# Patient Record
Sex: Female | Born: 1945 | Race: White | Hispanic: No | Smoking: Former smoker
Health system: Southern US, Community
[De-identification: ages and names within clinical notes are randomized; demographics above are authoritative.]

## PROBLEM LIST (undated history)

## (undated) DIAGNOSIS — I10 Essential (primary) hypertension: Secondary | ICD-10-CM

## (undated) DIAGNOSIS — Z9289 Personal history of other medical treatment: Secondary | ICD-10-CM

## (undated) DIAGNOSIS — I619 Nontraumatic intracerebral hemorrhage, unspecified: Secondary | ICD-10-CM

## (undated) DIAGNOSIS — I714 Abdominal aortic aneurysm, without rupture, unspecified: Secondary | ICD-10-CM

## (undated) HISTORY — PX: ABDOMINAL AORTIC ANEURYSM REPAIR: SUR1152

---

## 2014-05-20 ENCOUNTER — Encounter (HOSPITAL_COMMUNITY)
Admission: EM | Disposition: A | Discharge status: Rehabilitation Facility | Payer: Self-pay | Source: Home / Self Care | Attending: Pulmonary Disease

## 2014-05-20 ENCOUNTER — Emergency Department (HOSPITAL_COMMUNITY): Payer: BLUE CROSS/BLUE SHIELD

## 2014-05-20 ENCOUNTER — Other Ambulatory Visit (HOSPITAL_COMMUNITY): Payer: Self-pay

## 2014-05-20 ENCOUNTER — Inpatient Hospital Stay (HOSPITAL_COMMUNITY)
Admission: EM | Admit: 2014-05-20 | Discharge: 2014-06-03 | DRG: 023 | Disposition: A | Discharge status: Rehabilitation Facility | Payer: BLUE CROSS/BLUE SHIELD | Attending: Pulmonary Disease | Admitting: Pulmonary Disease

## 2014-05-20 ENCOUNTER — Inpatient Hospital Stay (HOSPITAL_COMMUNITY): Payer: BLUE CROSS/BLUE SHIELD | Admitting: Certified Registered"

## 2014-05-20 ENCOUNTER — Encounter (HOSPITAL_COMMUNITY): Payer: Self-pay | Admitting: *Deleted

## 2014-05-20 DIAGNOSIS — E6609 Other obesity due to excess calories: Secondary | ICD-10-CM | POA: Diagnosis present

## 2014-05-20 DIAGNOSIS — J9811 Atelectasis: Secondary | ICD-10-CM

## 2014-05-20 DIAGNOSIS — I6340 Cerebral infarction due to embolism of unspecified cerebral artery: Secondary | ICD-10-CM | POA: Diagnosis not present

## 2014-05-20 DIAGNOSIS — E870 Hyperosmolality and hypernatremia: Secondary | ICD-10-CM | POA: Diagnosis not present

## 2014-05-20 DIAGNOSIS — Z91040 Latex allergy status: Secondary | ICD-10-CM | POA: Diagnosis not present

## 2014-05-20 DIAGNOSIS — Z4659 Encounter for fitting and adjustment of other gastrointestinal appliance and device: Secondary | ICD-10-CM

## 2014-05-20 DIAGNOSIS — Z7982 Long term (current) use of aspirin: Secondary | ICD-10-CM

## 2014-05-20 DIAGNOSIS — R1310 Dysphagia, unspecified: Secondary | ICD-10-CM | POA: Diagnosis present

## 2014-05-20 DIAGNOSIS — I639 Cerebral infarction, unspecified: Secondary | ICD-10-CM | POA: Diagnosis not present

## 2014-05-20 DIAGNOSIS — I63132 Cerebral infarction due to embolism of left carotid artery: Secondary | ICD-10-CM | POA: Diagnosis not present

## 2014-05-20 DIAGNOSIS — R0902 Hypoxemia: Secondary | ICD-10-CM

## 2014-05-20 DIAGNOSIS — G8191 Hemiplegia, unspecified affecting right dominant side: Secondary | ICD-10-CM | POA: Diagnosis present

## 2014-05-20 DIAGNOSIS — E785 Hyperlipidemia, unspecified: Secondary | ICD-10-CM | POA: Diagnosis present

## 2014-05-20 DIAGNOSIS — R4701 Aphasia: Secondary | ICD-10-CM | POA: Diagnosis present

## 2014-05-20 DIAGNOSIS — I615 Nontraumatic intracerebral hemorrhage, intraventricular: Secondary | ICD-10-CM | POA: Diagnosis not present

## 2014-05-20 DIAGNOSIS — E669 Obesity, unspecified: Secondary | ICD-10-CM | POA: Diagnosis not present

## 2014-05-20 DIAGNOSIS — G936 Cerebral edema: Secondary | ICD-10-CM | POA: Diagnosis present

## 2014-05-20 DIAGNOSIS — I612 Nontraumatic intracerebral hemorrhage in hemisphere, unspecified: Secondary | ICD-10-CM

## 2014-05-20 DIAGNOSIS — G935 Compression of brain: Secondary | ICD-10-CM | POA: Diagnosis present

## 2014-05-20 DIAGNOSIS — Z87891 Personal history of nicotine dependence: Secondary | ICD-10-CM

## 2014-05-20 DIAGNOSIS — E876 Hypokalemia: Secondary | ICD-10-CM | POA: Diagnosis present

## 2014-05-20 DIAGNOSIS — Z9289 Personal history of other medical treatment: Secondary | ICD-10-CM

## 2014-05-20 DIAGNOSIS — Z7902 Long term (current) use of antithrombotics/antiplatelets: Secondary | ICD-10-CM

## 2014-05-20 DIAGNOSIS — I619 Nontraumatic intracerebral hemorrhage, unspecified: Secondary | ICD-10-CM

## 2014-05-20 DIAGNOSIS — I10 Essential (primary) hypertension: Secondary | ICD-10-CM | POA: Diagnosis present

## 2014-05-20 DIAGNOSIS — Z95828 Presence of other vascular implants and grafts: Secondary | ICD-10-CM | POA: Diagnosis not present

## 2014-05-20 DIAGNOSIS — I161 Hypertensive emergency: Secondary | ICD-10-CM

## 2014-05-20 DIAGNOSIS — Z91048 Other nonmedicinal substance allergy status: Secondary | ICD-10-CM

## 2014-05-20 DIAGNOSIS — E875 Hyperkalemia: Secondary | ICD-10-CM | POA: Diagnosis not present

## 2014-05-20 DIAGNOSIS — Z6838 Body mass index (BMI) 38.0-38.9, adult: Secondary | ICD-10-CM | POA: Diagnosis not present

## 2014-05-20 DIAGNOSIS — R001 Bradycardia, unspecified: Secondary | ICD-10-CM | POA: Diagnosis not present

## 2014-05-20 DIAGNOSIS — Z888 Allergy status to other drugs, medicaments and biological substances status: Secondary | ICD-10-CM

## 2014-05-20 DIAGNOSIS — J9690 Respiratory failure, unspecified, unspecified whether with hypoxia or hypercapnia: Secondary | ICD-10-CM

## 2014-05-20 DIAGNOSIS — Z978 Presence of other specified devices: Secondary | ICD-10-CM

## 2014-05-20 DIAGNOSIS — E039 Hypothyroidism, unspecified: Secondary | ICD-10-CM | POA: Diagnosis present

## 2014-05-20 DIAGNOSIS — I950 Idiopathic hypotension: Secondary | ICD-10-CM | POA: Diagnosis not present

## 2014-05-20 DIAGNOSIS — R5381 Other malaise: Secondary | ICD-10-CM | POA: Diagnosis not present

## 2014-05-20 DIAGNOSIS — D6489 Other specified anemias: Secondary | ICD-10-CM | POA: Diagnosis not present

## 2014-05-20 DIAGNOSIS — R29810 Facial weakness: Secondary | ICD-10-CM | POA: Diagnosis present

## 2014-05-20 DIAGNOSIS — I9589 Other hypotension: Secondary | ICD-10-CM | POA: Diagnosis present

## 2014-05-20 DIAGNOSIS — R4182 Altered mental status, unspecified: Secondary | ICD-10-CM | POA: Diagnosis present

## 2014-05-20 DIAGNOSIS — I611 Nontraumatic intracerebral hemorrhage in hemisphere, cortical: Secondary | ICD-10-CM | POA: Diagnosis present

## 2014-05-20 DIAGNOSIS — I959 Hypotension, unspecified: Secondary | ICD-10-CM | POA: Insufficient documentation

## 2014-05-20 DIAGNOSIS — R739 Hyperglycemia, unspecified: Secondary | ICD-10-CM | POA: Diagnosis present

## 2014-05-20 DIAGNOSIS — Z789 Other specified health status: Secondary | ICD-10-CM | POA: Diagnosis not present

## 2014-05-20 DIAGNOSIS — I610 Nontraumatic intracerebral hemorrhage in hemisphere, subcortical: Secondary | ICD-10-CM

## 2014-05-20 DIAGNOSIS — I609 Nontraumatic subarachnoid hemorrhage, unspecified: Secondary | ICD-10-CM | POA: Diagnosis not present

## 2014-05-20 DIAGNOSIS — J9600 Acute respiratory failure, unspecified whether with hypoxia or hypercapnia: Secondary | ICD-10-CM

## 2014-05-20 DIAGNOSIS — G8190 Hemiplegia, unspecified affecting unspecified side: Secondary | ICD-10-CM | POA: Diagnosis not present

## 2014-05-20 DIAGNOSIS — I6789 Other cerebrovascular disease: Secondary | ICD-10-CM | POA: Diagnosis not present

## 2014-05-20 HISTORY — PX: CRANIOTOMY: SHX93

## 2014-05-20 HISTORY — DX: Abdominal aortic aneurysm, without rupture, unspecified: I71.40

## 2014-05-20 HISTORY — DX: Essential (primary) hypertension: I10

## 2014-05-20 HISTORY — DX: Abdominal aortic aneurysm, without rupture: I71.4

## 2014-05-20 HISTORY — DX: Nontraumatic intracerebral hemorrhage, unspecified: I61.9

## 2014-05-20 HISTORY — DX: Personal history of other medical treatment: Z92.89

## 2014-05-20 LAB — COMPREHENSIVE METABOLIC PANEL
ALT: 29 U/L (ref 0–35)
AST: 28 U/L (ref 0–37)
Albumin: 3.9 g/dL (ref 3.5–5.2)
Alkaline Phosphatase: 125 U/L — ABNORMAL HIGH (ref 39–117)
Anion gap: 9 (ref 5–15)
BILIRUBIN TOTAL: 0.6 mg/dL (ref 0.3–1.2)
BUN: 12 mg/dL (ref 6–23)
CO2: 25 mmol/L (ref 19–32)
CREATININE: 0.62 mg/dL (ref 0.50–1.10)
Calcium: 9.3 mg/dL (ref 8.4–10.5)
Chloride: 105 mmol/L (ref 96–112)
GFR calc Af Amer: >90 mL/min (ref >90)
GFR calc non Af Amer: >90 mL/min (ref >90)
GLUCOSE: 118 mg/dL — AB (ref 70–99)
Potassium: 3.4 mmol/L — ABNORMAL LOW (ref 3.5–5.1)
Sodium: 139 mmol/L (ref 135–145)
Total Protein: 6.7 g/dL (ref 6.0–8.3)

## 2014-05-20 LAB — DIFFERENTIAL
BASOS PCT: 0 % (ref 0–1)
Basophils Absolute: 0 10*3/uL (ref 0.0–0.1)
EOS ABS: 0.2 10*3/uL (ref 0.0–0.7)
EOS PCT: 3 % (ref 0–5)
Lymphocytes Relative: 22 % (ref 12–46)
Lymphs Abs: 2.1 10*3/uL (ref 0.7–4.0)
MONO ABS: 1 10*3/uL (ref 0.1–1.0)
Monocytes Relative: 10 % (ref 3–12)
Neutro Abs: 6.3 10*3/uL (ref 1.7–7.7)
Neutrophils Relative %: 65 % (ref 43–77)

## 2014-05-20 LAB — BLOOD GAS, ARTERIAL
ACID-BASE DEFICIT: 1 mmol/L (ref 0.0–2.0)
Bicarbonate: 23.7 mEq/L (ref 20.0–24.0)
Drawn by: 347621
FIO2: 0.4 %
O2 Saturation: 94.2 %
PATIENT TEMPERATURE: 97
PEEP: 5 cmH2O
RATE: 16 resp/min
TCO2: 25 mmol/L (ref 0–100)
VT: 500 mL
pCO2 arterial: 41.7 mmHg (ref 35.0–45.0)
pH, Arterial: 7.368 (ref 7.350–7.450)
pO2, Arterial: 74.9 mmHg — ABNORMAL LOW (ref 80.0–100.0)

## 2014-05-20 LAB — BASIC METABOLIC PANEL
Anion gap: 13 (ref 5–15)
BUN: 10 mg/dL (ref 6–23)
CHLORIDE: 105 mmol/L (ref 96–112)
CO2: 23 mmol/L (ref 19–32)
Calcium: 8.4 mg/dL (ref 8.4–10.5)
Creatinine, Ser: 0.73 mg/dL (ref 0.50–1.10)
GFR calc non Af Amer: 86 mL/min — ABNORMAL LOW (ref >90)
Glucose, Bld: 150 mg/dL — ABNORMAL HIGH (ref 70–99)
POTASSIUM: 3.9 mmol/L (ref 3.5–5.1)
SODIUM: 141 mmol/L (ref 135–145)

## 2014-05-20 LAB — TYPE AND SCREEN
ABO/RH(D): A POS
ANTIBODY SCREEN: NEGATIVE

## 2014-05-20 LAB — URINALYSIS, ROUTINE W REFLEX MICROSCOPIC
Bilirubin Urine: NEGATIVE
GLUCOSE, UA: NEGATIVE mg/dL
Hgb urine dipstick: NEGATIVE
Ketones, ur: NEGATIVE mg/dL
LEUKOCYTES UA: NEGATIVE
NITRITE: NEGATIVE
Protein, ur: 100 mg/dL — AB
Specific Gravity, Urine: 1.017 (ref 1.005–1.030)
Urobilinogen, UA: 0.2 mg/dL (ref 0.0–1.0)
pH: 7.5 (ref 5.0–8.0)

## 2014-05-20 LAB — URINE MICROSCOPIC-ADD ON

## 2014-05-20 LAB — APTT: aPTT: 30 seconds (ref 24–37)

## 2014-05-20 LAB — RAPID URINE DRUG SCREEN, HOSP PERFORMED
Amphetamines: NOT DETECTED
BARBITURATES: NOT DETECTED
Benzodiazepines: NOT DETECTED
Cocaine: NOT DETECTED
Opiates: NOT DETECTED
TETRAHYDROCANNABINOL: NOT DETECTED

## 2014-05-20 LAB — PROTIME-INR
INR: 0.91 (ref 0.00–1.49)
Prothrombin Time: 12.4 seconds (ref 11.6–15.2)

## 2014-05-20 LAB — ETHANOL: Alcohol, Ethyl (B): <5 mg/dL (ref 0–9)

## 2014-05-20 LAB — ABO/RH: ABO/RH(D): A POS

## 2014-05-20 LAB — CBC
HCT: 38.4 % (ref 36.0–46.0)
HEMATOCRIT: 40 % (ref 36.0–46.0)
HEMOGLOBIN: 12.7 g/dL (ref 12.0–15.0)
HEMOGLOBIN: 12 g/dL (ref 12.0–15.0)
MCH: 27.1 pg (ref 26.0–34.0)
MCH: 27.1 pg (ref 26.0–34.0)
MCHC: 31.8 g/dL (ref 30.0–36.0)
MCHC: 31.3 g/dL (ref 30.0–36.0)
MCV: 85.5 fL (ref 78.0–100.0)
MCV: 86.7 fL (ref 78.0–100.0)
Platelets: 234 10*3/uL (ref 150–400)
Platelets: 247 10*3/uL (ref 150–400)
RBC: 4.68 MIL/uL (ref 3.87–5.11)
RBC: 4.43 MIL/uL (ref 3.87–5.11)
RDW: 15.3 % (ref 11.5–15.5)
RDW: 15.5 % (ref 11.5–15.5)
WBC: 9.7 10*3/uL (ref 4.0–10.5)
WBC: 17 10*3/uL — AB (ref 4.0–10.5)

## 2014-05-20 LAB — I-STAT CHEM 8, ED
BUN: 16 mg/dL (ref 6–23)
Calcium, Ion: 1.17 mmol/L (ref 1.13–1.30)
Chloride: 106 mmol/L (ref 96–112)
Creatinine, Ser: 0.6 mg/dL (ref 0.50–1.10)
Glucose, Bld: 125 mg/dL — ABNORMAL HIGH (ref 70–99)
HCT: 42 % (ref 36.0–46.0)
HEMOGLOBIN: 14.3 g/dL (ref 12.0–15.0)
POTASSIUM: 3.4 mmol/L — AB (ref 3.5–5.1)
Sodium: 140 mmol/L (ref 135–145)
TCO2: 24 mmol/L (ref 0–100)

## 2014-05-20 LAB — MRSA PCR SCREENING: MRSA BY PCR: POSITIVE — AB

## 2014-05-20 LAB — I-STAT TROPONIN, ED: Troponin i, poc: 0 ng/mL (ref 0.00–0.08)

## 2014-05-20 LAB — GLUCOSE, CAPILLARY
Glucose-Capillary: 129 mg/dL — ABNORMAL HIGH (ref 70–99)
Glucose-Capillary: 166 mg/dL — ABNORMAL HIGH (ref 70–99)

## 2014-05-20 SURGERY — CRANIOTOMY HEMATOMA EVACUATION SUBDURAL
Anesthesia: General | Site: Head | Laterality: Left

## 2014-05-20 MED ORDER — ACETAMINOPHEN 325 MG PO TABS: 650.0000 mg | ORAL_TABLET | ORAL | Status: DC | PRN

## 2014-05-20 MED ORDER — FENTANYL CITRATE (PF) 250 MCG/5ML IJ SOLN
INTRAMUSCULAR | Status: AC
  Filled 2014-05-20: qty 5

## 2014-05-20 MED ORDER — THROMBIN 20000 UNITS EX SOLR
CUTANEOUS | Status: DC | PRN
  Administered 2014-05-20: 20 mL via TOPICAL

## 2014-05-20 MED ORDER — SODIUM CHLORIDE 0.9 % IJ SOLN
INTRAMUSCULAR | Status: AC
  Filled 2014-05-20: qty 10

## 2014-05-20 MED ORDER — POTASSIUM CHLORIDE 20 MEQ/15ML (10%) PO SOLN
40.0000 meq | Freq: Once | ORAL | Status: DC
  Filled 2014-05-20: qty 30

## 2014-05-20 MED ORDER — STROKE: EARLY STAGES OF RECOVERY BOOK
Freq: Once | Status: AC
  Administered 2014-05-20: 17:00:00
  Filled 2014-05-20: qty 1

## 2014-05-20 MED ORDER — ACETAMINOPHEN 650 MG RE SUPP: 650.0000 mg | RECTAL | Status: DC | PRN

## 2014-05-20 MED ORDER — PHENYLEPHRINE HCL 10 MG/ML IJ SOLN
10.0000 mg | INTRAVENOUS | Status: DC | PRN
  Administered 2014-05-20: 30 ug/min via INTRAVENOUS

## 2014-05-20 MED ORDER — SUCCINYLCHOLINE CHLORIDE 20 MG/ML IJ SOLN
INTRAMUSCULAR | Status: AC
  Administered 2014-05-20: 20 mg
  Filled 2014-05-20: qty 1

## 2014-05-20 MED ORDER — SENNOSIDES-DOCUSATE SODIUM 8.6-50 MG PO TABS
1.0000 | ORAL_TABLET | Freq: Two times a day (BID) | ORAL | Status: DC
  Administered 2014-05-21 – 2014-05-23 (×6): 1
  Filled 2014-05-20 (×13): qty 1

## 2014-05-20 MED ORDER — THROMBIN 5000 UNITS EX SOLR
OROMUCOSAL | Status: DC | PRN
  Administered 2014-05-20 (×2): 10 mL via TOPICAL

## 2014-05-20 MED ORDER — LIDOCAINE HCL (CARDIAC) 20 MG/ML IV SOLN
INTRAVENOUS | Status: AC
  Filled 2014-05-20: qty 5

## 2014-05-20 MED ORDER — FENTANYL CITRATE (PF) 100 MCG/2ML IJ SOLN
25.0000 ug | INTRAMUSCULAR | Status: DC | PRN
  Administered 2014-05-20: 100 ug via INTRAVENOUS
  Administered 2014-05-21: 25 ug via INTRAVENOUS
  Filled 2014-05-20 (×2): qty 2

## 2014-05-20 MED ORDER — NICARDIPINE HCL IN NACL 20-0.86 MG/200ML-% IV SOLN
3.0000 mg/h | Freq: Once | INTRAVENOUS | Status: AC
  Administered 2014-05-20: 0.5 mg/h via INTRAVENOUS

## 2014-05-20 MED ORDER — LABETALOL HCL 5 MG/ML IV SOLN: 10.0000 mg | INTRAVENOUS | Status: DC | PRN

## 2014-05-20 MED ORDER — ONDANSETRON HCL 4 MG/2ML IJ SOLN
INTRAMUSCULAR | Status: AC
  Filled 2014-05-20: qty 2

## 2014-05-20 MED ORDER — CHLORHEXIDINE GLUCONATE 0.12 % MT SOLN
15.0000 mL | Freq: Two times a day (BID) | OROMUCOSAL | Status: DC
  Administered 2014-05-21 – 2014-06-01 (×23): 15 mL via OROMUCOSAL
  Filled 2014-05-20 (×24): qty 15

## 2014-05-20 MED ORDER — ROCURONIUM BROMIDE 50 MG/5ML IV SOLN
INTRAVENOUS | Status: AC
  Filled 2014-05-20: qty 1

## 2014-05-20 MED ORDER — CETYLPYRIDINIUM CHLORIDE 0.05 % MT LIQD
7.0000 mL | Freq: Four times a day (QID) | OROMUCOSAL | Status: DC
  Administered 2014-05-21 – 2014-06-01 (×48): 7 mL via OROMUCOSAL

## 2014-05-20 MED ORDER — MICROFIBRILLAR COLL HEMOSTAT EX PADS
MEDICATED_PAD | CUTANEOUS | Status: DC | PRN
  Administered 2014-05-20: 1 via TOPICAL

## 2014-05-20 MED ORDER — CEFAZOLIN SODIUM-DEXTROSE 2-3 GM-% IV SOLR
INTRAVENOUS | Status: AC
  Filled 2014-05-20: qty 50

## 2014-05-20 MED ORDER — ROCURONIUM BROMIDE 100 MG/10ML IV SOLN
INTRAVENOUS | Status: DC | PRN
  Administered 2014-05-20: 50 mg via INTRAVENOUS
  Administered 2014-05-20: 40 mg via INTRAVENOUS
  Administered 2014-05-20: 10 mg via INTRAVENOUS

## 2014-05-20 MED ORDER — NICARDIPINE HCL IN NACL 20-0.86 MG/200ML-% IV SOLN
INTRAVENOUS | Status: AC
  Filled 2014-05-20: qty 200

## 2014-05-20 MED ORDER — ARTIFICIAL TEARS OP OINT
TOPICAL_OINTMENT | OPHTHALMIC | Status: AC
  Filled 2014-05-20: qty 3.5

## 2014-05-20 MED ORDER — FENTANYL CITRATE (PF) 100 MCG/2ML IJ SOLN
INTRAMUSCULAR | Status: DC | PRN
  Administered 2014-05-20 (×4): 100 ug via INTRAVENOUS
  Administered 2014-05-20: 50 ug via INTRAVENOUS

## 2014-05-20 MED ORDER — ONDANSETRON HCL 4 MG PO TABS: 4.0000 mg | ORAL_TABLET | ORAL | Status: DC | PRN

## 2014-05-20 MED ORDER — PROMETHAZINE HCL 25 MG PO TABS: 12.5000 mg | ORAL_TABLET | ORAL | Status: DC | PRN

## 2014-05-20 MED ORDER — ONDANSETRON HCL 4 MG/2ML IJ SOLN
INTRAMUSCULAR | Status: AC
  Administered 2014-05-20: 4 mg
  Filled 2014-05-20: qty 2

## 2014-05-20 MED ORDER — ONDANSETRON HCL 4 MG/2ML IJ SOLN
4.0000 mg | INTRAMUSCULAR | Status: DC | PRN
  Administered 2014-05-20: 4 mg via INTRAVENOUS

## 2014-05-20 MED ORDER — ONDANSETRON HCL 4 MG/2ML IJ SOLN
4.0000 mg | Freq: Four times a day (QID) | INTRAMUSCULAR | Status: DC | PRN
Start: 2014-05-20 — End: 2014-06-03
  Administered 2014-05-20: 4 mg via INTRAVENOUS
  Filled 2014-05-20: qty 2

## 2014-05-20 MED ORDER — ROCURONIUM BROMIDE 50 MG/5ML IV SOLN
INTRAVENOUS | Status: AC
  Filled 2014-05-20: qty 2

## 2014-05-20 MED ORDER — CEFAZOLIN SODIUM-DEXTROSE 2-3 GM-% IV SOLR
2.0000 g | Freq: Three times a day (TID) | INTRAVENOUS | Status: AC
  Administered 2014-05-20 – 2014-05-21 (×2): 2 g via INTRAVENOUS
  Filled 2014-05-20 (×2): qty 50

## 2014-05-20 MED ORDER — PROPOFOL 1000 MG/100ML IV EMUL
5.0000 ug/kg/min | Freq: Once | INTRAVENOUS | Status: DC
  Administered 2014-05-20: 30 ug/kg/min via INTRAVENOUS

## 2014-05-20 MED ORDER — SODIUM CHLORIDE 0.9 % IV SOLN
500.0000 mg | Freq: Two times a day (BID) | INTRAVENOUS | Status: DC
  Administered 2014-05-20 – 2014-05-28 (×17): 500 mg via INTRAVENOUS
  Filled 2014-05-20 (×18): qty 5

## 2014-05-20 MED ORDER — PROPOFOL 1000 MG/100ML IV EMUL
INTRAVENOUS | Status: AC
  Filled 2014-05-20: qty 100

## 2014-05-20 MED ORDER — ASPIRIN 300 MG RE SUPP: 300.0000 mg | RECTAL | Status: DC

## 2014-05-20 MED ORDER — FENTANYL CITRATE (PF) 100 MCG/2ML IJ SOLN: 12.5000 ug | INTRAMUSCULAR | Status: DC | PRN

## 2014-05-20 MED ORDER — NICARDIPINE HCL IN NACL 20-0.86 MG/200ML-% IV SOLN: 3.0000 mg/h | Freq: Once | INTRAVENOUS | Status: DC

## 2014-05-20 MED ORDER — CEFAZOLIN SODIUM-DEXTROSE 2-3 GM-% IV SOLR
INTRAVENOUS | Status: DC | PRN
  Administered 2014-05-20: 2 g via INTRAVENOUS

## 2014-05-20 MED ORDER — PANTOPRAZOLE SODIUM 40 MG IV SOLR
40.0000 mg | Freq: Every day | INTRAVENOUS | Status: DC
  Administered 2014-05-20: 40 mg via INTRAVENOUS
  Filled 2014-05-20 (×2): qty 40

## 2014-05-20 MED ORDER — LIDOCAINE HCL (PF) 1 % IJ SOLN
INTRAMUSCULAR | Status: DC | PRN
  Administered 2014-05-20: 6 mL

## 2014-05-20 MED ORDER — SODIUM CHLORIDE 0.9 % IV SOLN
Freq: Once | INTRAVENOUS | Status: AC
  Administered 2014-05-20: 22:00:00 via INTRAVENOUS

## 2014-05-20 MED ORDER — ETOMIDATE 2 MG/ML IV SOLN
INTRAVENOUS | Status: AC
  Administered 2014-05-20: 20 mg
  Filled 2014-05-20: qty 20

## 2014-05-20 MED ORDER — EPHEDRINE SULFATE 50 MG/ML IJ SOLN
INTRAMUSCULAR | Status: AC
  Filled 2014-05-20: qty 1

## 2014-05-20 MED ORDER — SODIUM CHLORIDE 0.9 % IV SOLN
INTRAVENOUS | Status: DC
  Administered 2014-05-20 (×2): via INTRAVENOUS

## 2014-05-20 MED ORDER — BACITRACIN 50000 UNITS IM SOLR
INTRAMUSCULAR | Status: DC | PRN
  Administered 2014-05-20: 500 mL

## 2014-05-20 MED ORDER — ASPIRIN 81 MG PO CHEW: 324.0000 mg | CHEWABLE_TABLET | ORAL | Status: DC

## 2014-05-20 MED ORDER — NICARDIPINE HCL IN NACL 20-0.86 MG/200ML-% IV SOLN
3.0000 mg/h | INTRAVENOUS | Status: DC
  Administered 2014-05-20: 7 mg/h via INTRAVENOUS
  Filled 2014-05-20 (×2): qty 200

## 2014-05-20 MED ORDER — PHENYLEPHRINE HCL 10 MG/ML IJ SOLN
INTRAMUSCULAR | Status: DC | PRN
  Administered 2014-05-20 (×2): 80 ug via INTRAVENOUS

## 2014-05-20 MED ORDER — PANTOPRAZOLE SODIUM 40 MG IV SOLR: 40.0000 mg | Freq: Every day | INTRAVENOUS | Status: DC

## 2014-05-20 MED ORDER — SENNOSIDES-DOCUSATE SODIUM 8.6-50 MG PO TABS
1.0000 | ORAL_TABLET | Freq: Two times a day (BID) | ORAL | Status: DC
  Filled 2014-05-20: qty 1

## 2014-05-20 MED ORDER — ACETAMINOPHEN 650 MG RE SUPP
650.0000 mg | RECTAL | Status: DC | PRN
Start: 2014-05-20 — End: 2014-05-29

## 2014-05-20 MED ORDER — LEVOTHYROXINE SODIUM 100 MCG IV SOLR
37.5000 ug | Freq: Every day | INTRAVENOUS | Status: DC
  Administered 2014-05-21: 37.5 ug via INTRAVENOUS
  Filled 2014-05-20 (×2): qty 5

## 2014-05-20 MED ORDER — 0.9 % SODIUM CHLORIDE (POUR BTL) OPTIME
TOPICAL | Status: DC | PRN
  Administered 2014-05-20 (×3): 1000 mL

## 2014-05-20 MED ORDER — PROPOFOL 10 MG/ML IV BOLUS
INTRAVENOUS | Status: AC
  Filled 2014-05-20: qty 20

## 2014-05-20 MED ORDER — MORPHINE SULFATE 2 MG/ML IJ SOLN: 1.0000 mg | INTRAMUSCULAR | Status: DC | PRN

## 2014-05-20 MED ORDER — SODIUM CHLORIDE 0.9 % IV SOLN
250.0000 mL | INTRAVENOUS | Status: DC | PRN
  Administered 2014-05-20: 18:00:00 via INTRAVENOUS

## 2014-05-20 MED ORDER — PROPOFOL 1000 MG/100ML IV EMUL
5.0000 ug/kg/min | INTRAVENOUS | Status: DC
  Administered 2014-05-20: 30 ug/kg/min via INTRAVENOUS
  Administered 2014-05-21: 50 ug/kg/min via INTRAVENOUS
  Administered 2014-05-21: 40 ug/kg/min via INTRAVENOUS
  Administered 2014-05-21: 35 ug/kg/min via INTRAVENOUS
  Administered 2014-05-21: 40 ug/kg/min via INTRAVENOUS
  Administered 2014-05-21: 45 ug/kg/min via INTRAVENOUS
  Administered 2014-05-22: 50 ug/kg/min via INTRAVENOUS
  Administered 2014-05-22: 20 ug/kg/min via INTRAVENOUS
  Administered 2014-05-22: 40 ug/kg/min via INTRAVENOUS
  Filled 2014-05-20 (×9): qty 100

## 2014-05-20 SURGICAL SUPPLY — 64 items
BAG DECANTER FOR FLEXI CONT (MISCELLANEOUS) ×3 IMPLANT
BANDAGE GAUZE 4  KLING STR (GAUZE/BANDAGES/DRESSINGS) ×3 IMPLANT
BENZOIN TINCTURE PRP APPL 2/3 (GAUZE/BANDAGES/DRESSINGS) ×3 IMPLANT
BIT DRILL WIRE PASS 1.3MM (BIT) ×1 IMPLANT
BLADE CLIPPER SURG (BLADE) ×3 IMPLANT
BNDG GAUZE ELAST 4 BULKY (GAUZE/BANDAGES/DRESSINGS) ×3 IMPLANT
BRUSH SCRUB EZ 1% IODOPHOR (MISCELLANEOUS) IMPLANT
BRUSH SCRUB EZ PLAIN DRY (MISCELLANEOUS) ×3 IMPLANT
BUR ROUTER D-58 CRANI (BURR) IMPLANT
CANISTER SUCT 3000ML PPV (MISCELLANEOUS) ×3 IMPLANT
CLIP TI MEDIUM 6 (CLIP) IMPLANT
CONT SPEC 4OZ CLIKSEAL STRL BL (MISCELLANEOUS) ×3 IMPLANT
COVER BACK TABLE 60X90IN (DRAPES) ×6 IMPLANT
DRAPE NEUROLOGICAL W/INCISE (DRAPES) ×3 IMPLANT
DRAPE SURG 17X23 STRL (DRAPES) IMPLANT
DRAPE WARM FLUID 44X44 (DRAPE) ×3 IMPLANT
DRILL WIRE PASS 1.3MM (BIT) ×3
DRSG TELFA 3X8 NADH (GAUZE/BANDAGES/DRESSINGS) ×3 IMPLANT
ELECT CAUTERY BLADE 6.4 (BLADE) ×3 IMPLANT
ELECT REM PT RETURN 9FT ADLT (ELECTROSURGICAL) ×3
ELECTRODE REM PT RTRN 9FT ADLT (ELECTROSURGICAL) ×1 IMPLANT
GAUZE SPONGE 4X4 12PLY STRL (GAUZE/BANDAGES/DRESSINGS) ×3 IMPLANT
GAUZE SPONGE 4X4 16PLY XRAY LF (GAUZE/BANDAGES/DRESSINGS) IMPLANT
GLOVE ECLIPSE 8.0 STRL XLNG CF (GLOVE) ×3 IMPLANT
GLOVE EXAM NITRILE LRG STRL (GLOVE) IMPLANT
GLOVE EXAM NITRILE XL STR (GLOVE) IMPLANT
GLOVE EXAM NITRILE XS STR PU (GLOVE) IMPLANT
GOWN STRL REUS W/ TWL LRG LVL3 (GOWN DISPOSABLE) IMPLANT
GOWN STRL REUS W/ TWL XL LVL3 (GOWN DISPOSABLE) IMPLANT
GOWN STRL REUS W/TWL 2XL LVL3 (GOWN DISPOSABLE) ×3 IMPLANT
GOWN STRL REUS W/TWL LRG LVL3 (GOWN DISPOSABLE)
GOWN STRL REUS W/TWL XL LVL3 (GOWN DISPOSABLE)
HEMOSTAT SURGICEL 2X14 (HEMOSTASIS) ×3 IMPLANT
KIT BASIN OR (CUSTOM PROCEDURE TRAY) ×3 IMPLANT
KIT ROOM TURNOVER OR (KITS) ×3 IMPLANT
NEEDLE HYPO 22GX1.5 SAFETY (NEEDLE) ×3 IMPLANT
NS IRRIG 1000ML POUR BTL (IV SOLUTION) ×3 IMPLANT
PACK CRANIOTOMY (CUSTOM PROCEDURE TRAY) ×3 IMPLANT
PAD ARMBOARD 7.5X6 YLW CONV (MISCELLANEOUS) ×3 IMPLANT
PAD EYE OVAL STERILE LF (GAUZE/BANDAGES/DRESSINGS) IMPLANT
PATTIES SURGICAL .25X.25 (GAUZE/BANDAGES/DRESSINGS) IMPLANT
PATTIES SURGICAL .5 X.5 (GAUZE/BANDAGES/DRESSINGS) IMPLANT
PATTIES SURGICAL .5 X3 (DISPOSABLE) IMPLANT
PATTIES SURGICAL .75X.75 (GAUZE/BANDAGES/DRESSINGS) ×3 IMPLANT
PATTIES SURGICAL 1X1 (DISPOSABLE) IMPLANT
PERFORATOR LRG  14-11MM (BIT) ×2
PERFORATOR LRG 14-11MM (BIT) ×1 IMPLANT
PIN MAYFIELD SKULL DISP (PIN) IMPLANT
PLATE 1.5  2HOLE LNG NEURO (Plate) ×6 IMPLANT
PLATE 1.5 2HOLE LNG NEURO (Plate) ×3 IMPLANT
SCREW SELF DRILL HT 1.5/4MM (Screw) ×18 IMPLANT
SPECIMEN JAR SMALL (MISCELLANEOUS) IMPLANT
SPONGE NEURO XRAY DETECT 1X3 (DISPOSABLE) IMPLANT
SPONGE SURGIFOAM ABS GEL 100 (HEMOSTASIS) ×3 IMPLANT
STAPLER SKIN PROX WIDE 3.9 (STAPLE) ×3 IMPLANT
SUT NURALON 4 0 TR CR/8 (SUTURE) ×6 IMPLANT
SUT VIC AB 2-0 OS6 18 (SUTURE) ×9 IMPLANT
SYR CONTROL 10ML LL (SYRINGE) ×3 IMPLANT
TAPE CLOTH 1X10 TAN NS (GAUZE/BANDAGES/DRESSINGS) ×3 IMPLANT
TOWEL OR 17X24 6PK STRL BLUE (TOWEL DISPOSABLE) ×3 IMPLANT
TOWEL OR 17X26 10 PK STRL BLUE (TOWEL DISPOSABLE) ×3 IMPLANT
TRAY FOLEY CATH 14FRSI W/METER (CATHETERS) IMPLANT
UNDERPAD 30X30 INCONTINENT (UNDERPADS AND DIAPERS) IMPLANT
WATER STERILE IRR 1000ML POUR (IV SOLUTION) ×3 IMPLANT

## 2014-05-20 NOTE — Consult Note (Signed)
Reason for Consult:Intracerebral hemorrhage Referring Physician: CCM  Carolyn [name] is an 69 y.o. female.  HPI: 69 yo female with history of hypertension, and recent stenting of AAA, and now takes plavix. She was in her usual state of health until today when she became suddenly comatose and was brought to [facility] where CT head showed left ICH with 3 mm of midline shift. Neurosurgical consult requested.   Past Medical History  Diagnosis Date  . Hypertension     Past Surgical History  Procedure Laterality Date  . Abdominal aortic aneurysm repair      No family history on file.  Social History:  has no tobacco, alcohol, and drug history on file.  Allergies: Allergies not on file  Medications: I have reviewed the patient's current medications.  Results for orders placed or performed during the hospital encounter of 05/20/14 (from the past 48 hour(s))  Ethanol     Status: None   Collection Time: 05/20/14  1:43 PM  Result Value Ref Range   Alcohol, Ethyl (B) <5 0 - 9 mg/dL    Comment:        LOWEST DETECTABLE LIMIT FOR SERUM ALCOHOL IS 11 mg/dL FOR MEDICAL PURPOSES ONLY   Protime-INR     Status: None   Collection Time: 05/20/14  1:43 PM  Result Value Ref Range   Prothrombin Time 12.4 11.6 - 15.2 seconds   INR 0.91 0.00 - 1.49  APTT     Status: None   Collection Time: 05/20/14  1:43 PM  Result Value Ref Range   aPTT 30 24 - 37 seconds  CBC     Status: None   Collection Time: 05/20/14  1:43 PM  Result Value Ref Range   WBC 9.7 4.0 - 10.5 K/uL   RBC 4.68 3.87 - 5.11 MIL/uL   Hemoglobin 12.7 12.0 - 15.0 g/dL   HCT 40.0 36.0 - 46.0 %   MCV 85.5 78.0 - 100.0 fL   MCH 27.1 26.0 - 34.0 pg   MCHC 31.8 30.0 - 36.0 g/dL   RDW 15.3 11.5 - 15.5 %   Platelets 234 150 - 400 K/uL  Differential     Status: None   Collection Time: 05/20/14  1:43 PM  Result Value Ref Range   Neutrophils Relative % 65 43 - 77 %   Neutro Abs 6.3 1.7 - 7.7 K/uL   Lymphocytes Relative 22 12 - 46 %    Lymphs Abs 2.1 0.7 - 4.0 K/uL   Monocytes Relative 10 3 - 12 %   Monocytes Absolute 1.0 0.1 - 1.0 K/uL   Eosinophils Relative 3 0 - 5 %   Eosinophils Absolute 0.2 0.0 - 0.7 K/uL   Basophils Relative 0 0 - 1 %   Basophils Absolute 0.0 0.0 - 0.1 K/uL  Comprehensive metabolic panel     Status: Abnormal   Collection Time: 05/20/14  1:43 PM  Result Value Ref Range   Sodium 139 135 - 145 mmol/L   Potassium 3.4 (L) 3.5 - 5.1 mmol/L   Chloride 105 96 - 112 mmol/L   CO2 25 19 - 32 mmol/L   Glucose, Bld 118 (H) 70 - 99 mg/dL   BUN 12 6 - 23 mg/dL   Creatinine, Ser 0.62 0.50 - 1.10 mg/dL   Calcium 9.3 8.4 - 10.5 mg/dL   Total Protein 6.7 6.0 - 8.3 g/dL   Albumin 3.9 3.5 - 5.2 g/dL   AST 28 0 - 37 U/L   ALT  29 0 - 35 U/L   Alkaline Phosphatase 125 (H) 39 - 117 U/L   Total Bilirubin 0.6 0.3 - 1.2 mg/dL   GFR calc non Af Amer >90 >90 mL/min   GFR calc Af Amer >90 >90 mL/min    Comment: (NOTE) The eGFR has been calculated using the CKD EPI equation. This calculation has not been validated in all clinical situations. eGFR's persistently <90 mL/min signify possible Chronic Kidney Disease.    Anion gap 9 5 - 15  I-Stat Chem 8, ED     Status: Abnormal   Collection Time: 05/20/14  1:56 PM  Result Value Ref Range   Sodium 140 135 - 145 mmol/L   Potassium 3.4 (L) 3.5 - 5.1 mmol/L   Chloride 106 96 - 112 mmol/L   BUN 16 6 - 23 mg/dL   Creatinine, Ser 0.60 0.50 - 1.10 mg/dL   Glucose, Bld 125 (H) 70 - 99 mg/dL   Calcium, Ion 1.17 1.13 - 1.30 mmol/L   TCO2 24 0 - 100 mmol/L   Hemoglobin 14.3 12.0 - 15.0 g/dL   HCT 42.0 36.0 - 46.0 %  I-Stat Troponin, ED (not at [facility])     Status: None   Collection Time: 05/20/14  1:57 PM  Result Value Ref Range   Troponin i, poc 0.00 0.00 - 0.08 ng/mL   Comment 3            Comment: Due to the release kinetics of cTnI, a negative result within the first hours of the onset of symptoms does not rule out myocardial infarction with certainty. If myocardial  infarction is still suspected, repeat the test at appropriate intervals.   Urine Drug Screen     Status: None   Collection Time: 05/20/14  3:13 PM  Result Value Ref Range   Opiates NONE DETECTED NONE DETECTED   Cocaine NONE DETECTED NONE DETECTED   Benzodiazepines NONE DETECTED NONE DETECTED   Amphetamines NONE DETECTED NONE DETECTED   Tetrahydrocannabinol NONE DETECTED NONE DETECTED   Barbiturates NONE DETECTED NONE DETECTED    Comment:        DRUG SCREEN FOR MEDICAL PURPOSES ONLY.  IF CONFIRMATION IS NEEDED FOR ANY PURPOSE, NOTIFY LAB WITHIN 5 DAYS.        LOWEST DETECTABLE LIMITS FOR URINE DRUG SCREEN Drug Class       Cutoff (ng/mL) Amphetamine      1000 Barbiturate      200 Benzodiazepine   425 Tricyclics       956 Opiates          300 Cocaine          300 THC              50   Urinalysis, Routine w reflex microscopic     Status: Abnormal   Collection Time: 05/20/14  3:13 PM  Result Value Ref Range   Color, Urine YELLOW YELLOW   APPearance TURBID (A) CLEAR   Specific Gravity, Urine 1.017 1.005 - 1.030   pH 7.5 5.0 - 8.0   Glucose, UA NEGATIVE NEGATIVE mg/dL   Hgb urine dipstick NEGATIVE NEGATIVE   Bilirubin Urine NEGATIVE NEGATIVE   Ketones, ur NEGATIVE NEGATIVE mg/dL   Protein, ur 100 (A) NEGATIVE mg/dL   Urobilinogen, UA 0.2 0.0 - 1.0 mg/dL   Nitrite NEGATIVE NEGATIVE   Leukocytes, UA NEGATIVE NEGATIVE  Urine microscopic-add on     Status: Abnormal   Collection Time: 05/20/14  3:13 PM  Result Value  Ref Range   Bacteria, UA FEW (A) RARE   Urine-Other AMORPHOUS URATES/PHOSPHATES     Ct Head Wo Contrast  05/20/2014   CLINICAL DATA:  Severe headache today. Right-sided weakness. Code stroke.  EXAM: CT HEAD WITHOUT CONTRAST  TECHNIQUE: Contiguous axial images were obtained from the base of the skull through the vertex without intravenous contrast.  COMPARISON:  None.  FINDINGS: There is a large acute intracerebral hemorrhage in the left frontoparietal region  measuring 7.0 x 3.8 cm. There is 7.5 mm of midline shift from left-to-right. There is blood in the left lateral ventricle.  No other abnormalities of the brain parenchyma.  Osseous structures are normal.  IMPRESSION: Large left frontal parietal intracerebral hemorrhage with a 7.5 mm of midline shift. The hemorrhage has extended into the left lateral ventricle. Critical Value/emergent results were called by telephone at the time of interpretation on 05/20/2014 at 2:00 pm to Dr. Nicole Kindred, who verbally acknowledged these results.   Electronically Signed   By: Lorriane Shire M.D.   On: 05/20/2014 14:02   Dg Chest Portable 1 View  05/20/2014   CLINICAL DATA:  Hypoxia  EXAM: PORTABLE CHEST - 1 VIEW  COMPARISON:  None.  FINDINGS: Endotracheal tube tip is at the carina with the tip directed toward the right main bronchus. No pneumothorax. No edema or consolidation. Heart is upper normal in size with pulmonary vascularity within normal limits. No adenopathy.  IMPRESSION: Endotracheal tube tip at the carina with the tip directed toward the right main bronchus. No edema or consolidation. No pneumothorax.  Critical Value/emergent results were called by telephone at the time of interpretation on 05/20/2014 at 3:02 pm to Dr. Quintella Reichert , who verbally acknowledged these results.   Electronically Signed   By: Lowella Grip III M.D.   On: 05/20/2014 15:02    Review of systems not obtained due to patient factors. Blood pressure 148/88, pulse 86, temperature 96.4 F (35.8 C), temperature source Axillary, resp. rate 16, weight 105.325 kg (232 lb 3.2 oz), SpO2 100 %. eyes closed; moves left side spontaneously; does not follow commands  Assessment/Plan: CT reviewed; very tough decision regarding need for surgical intervention; the clot is large and fairly superficial, and resection would be straight forward except for the fact she is on plavix; however, there is not a lot of midline shift, and surgery might not actually  accomplish much for decreasing pressure in light of that; I have had a long discussion with the family and tried as well as possible to describe all of the up side and down side of each decision; they are thinking about the decision and will make a decision shortly;  Faythe Ghee, MD 05/20/2014, 4:35 PM

## 2014-05-20 NOTE — ED Provider Notes (Signed)
CSN: [id number]     Arrival date & time 05/20/14  1340 History   First MD Initiated Contact with Patient 05/20/14 1349     No chief complaint on file.   An emergency department physician performed an initial assessment on this suspected stroke patient at 1340 (Jerson Furukawa).  The history is provided by the EMS personnel. No language interpreter was used.   Pt presents for evaluation of unresponsiveness. Patient presents as a code stroke. She was at work when she complained of headache and developed right sided weakness. Upon EMS arrival the patient was unresponsive. Level 5 Caveat due to unresponsiveness.  No past medical history on file. No past surgical history on file. No family history on file. History  Substance Use Topics  . Smoking status: Not on file  . Smokeless tobacco: Not on file  . Alcohol Use: Not on file   OB History    No data available     Review of Systems  Unable to perform ROS     Allergies  Review of patient's allergies indicates not on file.  Home Medications   Prior to Admission medications   Not on File   BP 231/113 mmHg  Pulse 91  Resp 15  SpO2 100% Physical Exam  Constitutional: She appears well-developed and well-nourished. She appears distressed.  HENT:  Head: Normocephalic and atraumatic.  Cardiovascular: Normal rate and regular rhythm.   No murmur heard. Pulmonary/Chest: Effort normal and breath sounds normal. No respiratory distress.  Abdominal: Soft. There is no tenderness. There is no rebound and no guarding.  Musculoskeletal: She exhibits no edema or tenderness.  Neurological:  Pupils reactive bilaterally with gaze deviated to the left. Weak spontaneous movement of the left upper extremity. Does not follow commands. Right-sided hemiparesis.  Skin: Skin is warm and dry.  Psychiatric:  Unable to assess  Nursing note and vitals reviewed.   ED Course  Procedures (including critical care time) INTUBATION Performed by: Tilden Fossa  Required items: required blood products, implants, devices, and special equipment available Patient identity confirmed: provided demographic data and hospital-assigned identification number Time out: Immediately prior to procedure a "time out" was called to verify the correct patient, procedure, equipment, support staff and site/side marked as required.  Indications: airway protection  Intubation method: Glidescope Laryngoscopy   Preoxygenation: BVM  Sedatives: Etomidate Paralytic: Succinylcholine  Tube Size: 7.5 cuffed  Post-procedure assessment: chest rise and ETCO2 monitor Breath sounds: equal and absent over the epigastrium Tube secured with: ETT holder Chest x-ray interpreted by radiologist and me.  Chest x-ray findings: endotracheal tube at level of carina, will retract  Patient tolerated the procedure well with no immediate complications.   CRITICAL CARE Performed by: Tilden Fossa   Total critical care time: 30 minutes  Critical care time was exclusive of separately billable procedures and treating other patients.  Critical care was necessary to treat or prevent imminent or life-threatening deterioration.  Critical care was time spent personally by me on the following activities: development of treatment plan with patient and/or surrogate as well as nursing, discussions with consultants, evaluation of patient's response to treatment, examination of patient, obtaining history from patient or surrogate, ordering and performing treatments and interventions, ordering and review of laboratory studies, ordering and review of radiographic studies, pulse oximetry and re-evaluation of patient's condition.  Labs Review Labs Reviewed  COMPREHENSIVE METABOLIC PANEL - Abnormal; Notable for the following:    Potassium 3.4 (*)    Glucose, Bld 118 (*)  Alkaline Phosphatase 125 (*)    All other components within normal limits  URINALYSIS, ROUTINE W REFLEX  MICROSCOPIC - Abnormal; Notable for the following:    APPearance TURBID (*)    Protein, ur 100 (*)    All other components within normal limits  URINE MICROSCOPIC-ADD ON - Abnormal; Notable for the following:    Bacteria, UA FEW (*)    All other components within normal limits  I-STAT CHEM 8, ED - Abnormal; Notable for the following:    Potassium 3.4 (*)    Glucose, Bld 125 (*)    All other components within normal limits  MRSA PCR SCREENING  ETHANOL  PROTIME-INR  APTT  CBC  DIFFERENTIAL  URINE RAPID DRUG SCREEN (HOSP PERFORMED)  BLOOD GAS, ARTERIAL  I-STAT TROPOININ, ED  I-STAT TROPOININ, ED    Imaging Review Ct Head Wo Contrast  05/20/2014   CLINICAL DATA:  Severe headache today. Right-sided weakness. Code stroke.  EXAM: CT HEAD WITHOUT CONTRAST  TECHNIQUE: Contiguous axial images were obtained from the base of the skull through the vertex without intravenous contrast.  COMPARISON:  None.  FINDINGS: There is a large acute intracerebral hemorrhage in the left frontoparietal region measuring 7.0 x 3.8 cm. There is 7.5 mm of midline shift from left-to-right. There is blood in the left lateral ventricle.  No other abnormalities of the brain parenchyma.  Osseous structures are normal.  IMPRESSION: Large left frontal parietal intracerebral hemorrhage with a 7.5 mm of midline shift. The hemorrhage has extended into the left lateral ventricle. Critical Value/emergent results were called by telephone at the time of interpretation on 05/20/2014 at 2:00 pm to Dr. Roseanne RenoStewart, who verbally acknowledged these results.   Electronically Signed   By: Francene BoyersJames  Maxwell M.D.   On: 05/20/2014 14:02   Dg Chest Portable 1 View  05/20/2014   CLINICAL DATA:  Hypoxia  EXAM: PORTABLE CHEST - 1 VIEW  COMPARISON:  None.  FINDINGS: Endotracheal tube tip is at the carina with the tip directed toward the right main bronchus. No pneumothorax. No edema or consolidation. Heart is upper normal in size with pulmonary vascularity  within normal limits. No adenopathy.  IMPRESSION: Endotracheal tube tip at the carina with the tip directed toward the right main bronchus. No edema or consolidation. No pneumothorax.  Critical Value/emergent results were called by telephone at the time of interpretation on 05/20/2014 at 3:02 pm to Dr. Tilden FossaELIZABETH Chayanne Speir , who verbally acknowledged these results.   Electronically Signed   By: Bretta BangWilliam  Woodruff III M.D.   On: 05/20/2014 15:02     EKG Interpretation None      MDM   Final diagnoses:  Respiratory failure    Patient here for evaluation of unresponsiveness. Patient arrived to the ED as a code stroke. Transferred to CT scan immediately for imaging. CT scan with interventricular hemorrhage. Patient transported back to the room. Patient did have episode of vomiting when she arrived to the room. Patient was intubated after arrival to the room for airway protection. There is no evidence of aspiration on endotracheal intubation. Patient noted to be hypertensive, started on Cardene drip. Patient admitted to neurology service for further management.    Tilden FossaElizabeth Madden Piazza, MD 05/20/14 [phone]

## 2014-05-20 NOTE — ED Notes (Signed)
PT arrived via [facility] after being called to patient's work place after patient became altered with right sided weakness.  Enroute to ED ems reported left sided gaze.  CBG 119.  Pt complained of a headache at work.  Pt arrived unresponsive, but did move left arm and semi-opened eyes.  Pt was seen by physician, neuro, and taken directly to CT 3.  After scan patient was transported back to TB and where she aspirated and planned for intubation

## 2014-05-20 NOTE — Progress Notes (Signed)
Chaplain responded to page from ED for family needing support. Chaplain facilitated communication between medical team and family, offered emotional support, and offered snacks/beverages. Accd to pt daughter, pt has had four surgeries since the beginning of the year including a heart stint placement four weeks ago. Pt reported to daughter that she was not feeling well lately. So pt scheduled doctors appt this morning, they did blood work and sent pt to work. Chaplain escorted pt family to [facility] waiting area and informed nurse of their presence. Currently pt's three adult children, Carolyn [name], Carolyn [name], and Carolyn [name], are present. Family will most likely need more information from the medical team as they were asking chaplain medical questions that she is unqualified to answer. Chaplain will continue to follow. Page chaplain as needed.  Sotiria Keast, Loa SocksCourtney F, Chaplain 05/20/2014 3:33 PM

## 2014-05-20 NOTE — Op Note (Signed)
Preop diagnosis: Intracerebral hemorrhage left frontoparietal Postop diagnosis: Same Procedure: Left frontoparietal craniotomy for evacuation of intracerebral hematoma Surgeon: Iwalani Templeton  After and placed the supine position with the head turned towards the right the patient's left scalp was shaved prepped and draped in the usual sterile fashion. Linear incision was made in the high left frontoparietal region with unipolar coagulation. Any bleeding was controlled and the incision was then spread open to expose the skull. 2 bur holes were placed and then the dura was separated from the bone. Cutting craniotome was used to form a small frontoparietal craniotomy bone flap and the flap was removed without difficulty. The dura was found markedly bulging and it was opened and obvious area of superficial hematoma was identified. Moderate corticectomy was made over the damage brain we could see hematoma just below the surface. We entered the hematoma cavity without difficulty and did an aggressive clean out of the hematoma. The brain became progressively more slack as were removed were hematoma. It appeared as if we got a cast of blood out of the left ventricle though no spinal fluid was encountered. When all of the obvious hematoma had been removed we began working out hemostasis along the cavity walls. We used Gelfoam, Surgifoam, cotton balls, and Surgicel stamps to help with hemostasis. Eventually, we irrigated copiously with clear return and felt that we was safe to close the incision. Surgicel stamps were placed along the edges of the hematoma cavity. Interrupted Nurolon stitches were placed on the dura and Gelfoam and Surgifoam were placed upon the dura to help with hemostasis. An epidural drain was left and brought out through a separate stab wound incision. The bone flap was reattached with 3 small clamps and the galea was then closed with inverted Vicryl. Staples were placed on the skin. A sterile dressing was  then applied and the patient was extubated and taken to recovery room in stable condition.

## 2014-05-20 NOTE — Consult Note (Signed)
Referring Physician: Pecola Leisure    Chief Complaint: Code stroke  HPI:                                                                                                                                         Carolyn [name] is an 69 y.o. female brought to hospital as code stroke.  EMS was called to her work and told she was noted to have right arm weakness then noted to complain of sever HA which then was followed by sudden unconsciousness. On arrival she was unconscious on NRB but not protectin g airway well.  CT head was obtained and showed a large left parietal ICH.  While in ED patient has one episode emesis and intubated for air way protection. NIH stroke score was 31. Blood pressure was markedly elevated, and at one point was 241/123. Emergency management of hypertension was undertaken with use of Cardene intravenously.  Date last known well: Date: 05/20/2014 Time last known well: Time: 12:40 tPA Given: No: ICH Modified Rankin:    Past medical history; Hypertension Recent stenting of AAA  No past surgical history on file.  Family history: Unavailable due to patient's mental status.   Social History:  has no tobacco, alcohol, and drug history on file.  Allergies: Allergies not on file  Medications:                                                                                                                           Current Facility-Administered Medications  Medication Dose Route Frequency Provider Last Rate Last Dose  . etomidate (AMIDATE) 2 MG/ML injection           . lidocaine (cardiac) 100 mg/33ml (XYLOCAINE) 20 MG/ML injection 2%           . nicardipine (CARDENE)  in 0.86% saline IV infusion (0.1 mg/ml)  3-15 mg/hr Intravenous Once Tilden Fossa, MD      . ondansetron [facility]) 4 MG/2ML injection           . rocuronium (ZEMURON) 50 MG/5ML injection           . succinylcholine (ANECTINE) 20 MG/ML injection            No current outpatient prescriptions on file.      ROS:  History obtained from unobtainable from patient due to mental status   Neurologic Examination:                                                                                                      Pulse 86, resp. rate 15, SpO2 100 %.  HEENT-  Normocephalic, no lesions, without obvious abnormality.  Normal external eye and conjunctiva.  Normal TM's bilaterally.  Normal auditory canals and external ears. Normal external nose, mucus membranes and septum.  Normal pharynx. Cardiovascular- S1, S2 normal, pulses palpable throughout   Lungs- chest clear, no wheezing, rales, normal symmetric air entry Abdomen- normal findings: bowel sounds normal Extremities- no edema Lymph-no adenopathy palpable Musculoskeletal-no joint tenderness, deformity or swelling Skin-warm and dry, no hyperpigmentation, vitiligo, or suspicious lesions  Neurological Examination Mental Status: Intubated and sedated, follows no commands, prior to intubation was moving left arm spontaniously and appeared purposefully Cranial Nerves: II: Discs flat bilaterally; no blink to threat, pupils equal, round, reactive to light and accommodation III,IV, VI: left gaze deviation V,VII: face symmetric, facial light touch sensation unable to assess VIII: unable to assess IX,X: unable to assess XI: unable to assess XII: unable to assess Motor: Moves left arm spontaneously antigravity prior to intubation. Right UE and bilateral LE flaccid.  Sensory: withdraws from pain in left arm only Deep Tendon Reflexes: depressed throughout Plantars: Mute bilaterally Cerebellar: Unable to assess Gait: unable to assess    Lab Results: Basic Metabolic Panel:  Recent Labs Lab 05/20/14 1356  NA 140  K 3.4*  CL 106  GLUCOSE 125*  BUN 16  CREATININE 0.60    Liver Function Tests: No results for  input(s): AST, ALT, ALKPHOS, BILITOT, PROT, ALBUMIN in the last 168 hours. No results for input(s): LIPASE, AMYLASE in the last 168 hours. No results for input(s): AMMONIA in the last 168 hours.  CBC:  Recent Labs Lab 05/20/14 1343 05/20/14 1356  WBC 9.7  --   NEUTROABS 6.3  --   HGB 12.7 14.3  HCT 40.0 42.0  MCV 85.5  --   PLT 234  --     Cardiac Enzymes: No results for input(s): CKTOTAL, CKMB, CKMBINDEX, TROPONINI in the last 168 hours.  Lipid Panel: No results for input(s): CHOL, TRIG, HDL, CHOLHDL, VLDL, LDLCALC in the last 168 hours.  CBG: No results for input(s): GLUCAP in the last 168 hours.  Microbiology: No results found for this or any previous visit.  Coagulation Studies: No results for input(s): LABPROT, INR in the last 72 hours.  Imaging: CT of the head without contrast - Large left frontal parietal intracerebral hemorrhage with a 7.5 mm of midline shift. The hemorrhage has extended into the left lateral ventricle. Critical Value/emergent results were called by telephone at the time of interpretation on 05/20/2014 at 2:00 pm to Dr. Roseanne Reno, who verbally acknowledged these results  Felicie Morn PA-C [facility] [phone]  05/20/2014, 2:02 PM   Assessment: 69 y.o. female presenting to ED as code stroke. CT exhibited a large left parietal ICH with ventricular extension with slight midline shift, likely  hypertensive in etiology, as patient also presented with hypertensive emergency, requiring intervention with IV Cardene drip.    Stroke Risk Factors - hypertension  I personally participated in this patient's evaluation and management, including examination and management of hypertensive emergency, as well as formulating the above clinical impression and management recommendations.  This patient is critically ill and at significant risk of neurological worsening or death, and care requires constant monitoring of vital signs, hemodynamics,respiratory  and cardiac monitoring, neurological assessment, discussion with family, other specialists and medical decision making of high complexity. Total critical care time was 90 minutes.  Venetia MaxonR Rion Schnitzer M.D. [facility] [phone]

## 2014-05-20 NOTE — Transfer of Care (Signed)
Immediate Anesthesia Transfer of Care Note  Patient: Carolyn [name]  Procedure(s) Performed: Procedure(s): CRANIOTOMY HEMATOMA EVACUATION (Left)  Patient Location: ICU  Anesthesia Type:General  Level of Consciousness: Patient remains intubated per anesthesia plan  Airway & Oxygen Therapy: Patient remains intubated per anesthesia plan  Post-op Assessment: Report given to RN and Post -op Vital signs reviewed and stable  Post vital signs: Reviewed and stable  Last Vitals:  Filed Vitals:   05/20/14 1800  BP: 142/53  Pulse: 69  Temp:   Resp: 17    Complications: No apparent anesthesia complications

## 2014-05-20 NOTE — ED Notes (Signed)
Pt arrives from CT vomiting- pads placed on pt.

## 2014-05-20 NOTE — H&P (Signed)
PULMONARY  / CRITICAL CARE MEDICINE  Name: [name] CourtGeraldine Trevizo MRN: [id number] DOB: 1945-06-27    LOS: 0  REFERRING MD :  Dr. Roseanne RenoStewart  CHIEF COMPLAINT:  ICH  BRIEF PATIENT DESCRIPTION: 69 yo F w/ no know PMH presented to the ED as a code stroke and was found to have acute ICH and was intubated for airway protection. She was also found to be significantly hypertensive and was started on a Cardene gtt in the ED. PCCM was called to admit.   LINES / TUBES: OETT 4/28>>  CULTURES: None  ANTIBIOTICS: None  SIGNIFICANT EVENTS:  4/28: R sided paralysis and L deviated gaze. CT imaging revealed a large left frontal parietal intracranial hemorrhage with 7.75mm midline shift. Intubated and started on a Cardene drip.  LEVEL OF CARE:  ICU PRIMARY SERVICE:  PCCM CONSULTANTS:  Neurology CODE STATUS: FULL DIET:  NPO DVT Px:  SCDs GI Px:  PPI  HISTORY OF PRESENT ILLNESS:   69yo F w/ no PMH was in her normal state of health today at work, then developed R sided weakness and severe headache, and then became unconscious. [facility] with right sided paralysis with left deviated gaze. NIHSS 31. CT imaging revealed a large left frontal parietal intracranial hemorrhage with 7.315mm midline shift an hemorrhage into her left lateral ventricle. She did have an episode of emesis and was intubated for airway protection. She was also found to be significantly hypertensive and was started on a Cardene drip to keep SBP<150.     PAST MEDICAL HISTORY :  Past Medical History  Diagnosis Date  . Hypertension    Past Surgical History  Procedure Laterality Date  . Abdominal aortic aneurysm repair     Prior to Admission medications   Not on File   Allergies not on file  FAMILY HISTORY:  No family history on file. SOCIAL HISTORY:  has no tobacco, alcohol, and drug history on file.  REVIEW OF SYSTEMS:   Unable to perform ROS, pt was minimally responsive on exam.    INTERVAL  HISTORY:   VITAL SIGNS: Pulse Rate:  [76-95] 80 (04/28 1443) Resp:  [13-24] 19 (04/28 1443) BP: (157-241)/(62-113) 157/62 mmHg (04/28 1440) SpO2:  [91 %-100 %] 97 % (04/28 1443) Weight:  [232 lb 3.2 oz (105.325 kg)] 232 lb 3.2 oz (105.325 kg) (04/28 1421) HEMODYNAMICS:   VENTILATOR SETTINGS:   INTAKE / OUTPUT: Intake/Output    None     PHYSICAL EXAMINATION: General:  Minimally repsonsive Neuro:  Left deviated gaze. Pt somewhat able to open her left eye on command, but unable to squeeze my hand or wiggle her toes w/o sedating medications HEENT:  ETT in place. PERRL, 2mm bilaterally Cardiovascular:  RRR, no appreciable murmur or rubs Lungs:  Diffuse rhoncorous breath sounds Abdomen:  Obese, soft, hyperactive bowel sounds Musculoskeletal:  Fasciculation of the left arm. No moving BLE or RUE Skin:  Appears to be intact. No appreciable rashes or breakdown.    LABS: Cbc  Recent Labs Lab 05/20/14 1343 05/20/14 1356  WBC 9.7  --   HGB 12.7 14.3  HCT 40.0 42.0  PLT 234  --     Chemistry   Recent Labs Lab 05/20/14 1343 05/20/14 1356  NA 139 140  K 3.4* 3.4*  CL 105 106  CO2 25  --   BUN 12 16  CREATININE 0.62 0.60  CALCIUM 9.3  --   GLUCOSE 118* 125*    Liver fxn  Recent Labs Lab 05/20/14 1343  AST 28  ALT 29  ALKPHOS 125*  BILITOT 0.6  PROT 6.7  ALBUMIN 3.9   coags  Recent Labs Lab 05/20/14 1343  APTT 30  INR 0.91   Sepsis markers No results for input(s): LATICACIDVEN, PROCALCITON in the last 168 hours. Cardiac markers No results for input(s): CKTOTAL, CKMB, TROPONINI in the last 168 hours. BNP No results for input(s): PROBNP in the last 168 hours. ABG  Recent Labs Lab 05/20/14 1356  TCO2 24    CBG trend No results for input(s): GLUCAP in the last 168 hours.  IMAGING:  ECG:  DIAGNOSES: Active Problems:   ICH (intracerebral hemorrhage)   ASSESSMENT / PLAN:  PULMONARY OETT 4/28 ASSESSMENT: Acute respiratory  failure Possible aspiration PLAN:   Full mechanical support, wean as able. VAP bundle. SBT in AM if able CXR in AM  CARDIOVASCULAR  ASSESSMENT:  Hypertensive emergency PLAN:  Cardene drip, goal BP<160/90  RENAL  ASSESSMENT:  Hypokalemia PLAN:   Replacing K CMP in AM  GASTROINTESTINAL  ASSESSMENT:  NPO  PLAN:   NPO while intubated SUP: PPI Consider starting TF if remains intubated >24hrs  HEMATOLOGIC  ASSESSMENT:   DVT PPx PLAN:  SCDs in setting of ICH CBC in AM  INFECTIOUS  ASSESSMENT: No issues  PLAN:   Monitor fever curve Checking CXR in AM  ENDOCRINE  ASSESSMENT:   Hypothyroidism (L-thyroxine 75 mcg daily) PLAN:   Cont L-thyroxine Monitor CBGs  NEUROLOGIC  ASSESSMENT:   Acute ICH with 7.26mm midline shift Propofol started in ED per Neuro PLAN:   Neuro following Continue sedation with propofol, consider weaning for RASS goal Fentanyl PRN  RASS goal: 0 to -1. Daily WUA.  CLINICAL SUMMARY:  69yo F w/ uncontrolled HTN presents with acute ICH and hypertensive emergency, requiring intubation for airway protection and Cardene drip for blood pressure control with goal SBP<150. No family at bedside.    Genelle Gather, MD Internal Medicine Resident, PGY III [facility] Internal Medicine Program Pager: [phone] 05/20/2014 2:45 PM    PCCM ATTENDING: I have reviewed pt's initial presentation, consultants notes and hospital database in detail.  The above assessment and plan was formulated under my direction.  In summary: Pt admitted with AMS due to L hemispheric hypertensive bleed. She required intubation in ED PCCM admitted pt due to intubated status Her vent settings have been reviewed and vent bundle implemented She was severely hypertensive in the ED and has been initiated on amlodipine gtt Dr Gerlene Fee evaluated pt and subsequently took to OR for evacuation of hematoma Stroke team also following I have updated family and we have talked  about goals of care The care plan has been reviewed and modified by me   35 minutes of independent CCM time was provided by me   Billy Fischer, MD;  PCCM service; Mobile [phone]

## 2014-05-20 NOTE — ED Notes (Signed)
Pt dentures placed in denture cup, labeled and placed in belonging bag.

## 2014-05-20 NOTE — Code Documentation (Signed)
69[id number]  EMS reports that the patient arrived to work with c/o headache.  At 1240 patient had sudden onset right sided weakness and developed altered mental status.  EMS called and activated code stroke.  Stroke team at the bedside on arrival.  Labs drawn and patient cleared by Dr. Madilyn Hookees.  Patient to CT which showed ICH.  Patient back to Trauma B and as arriving vomited.  Patient intubated by EDP.  Patient hypertensive requiring Cardene gtt to keep SBP<150.  NIHSS 31, see documentation for details and code stroke times.  Patient to be admitted to ICU.  Bedside handoff with ED RN Duwayne Heckanielle.

## 2014-05-20 NOTE — Progress Notes (Signed)
Patient back from OR. Placed on vent at 8cc 420,16,40% +5.

## 2014-05-20 NOTE — ED Notes (Signed)
Family at bedside,updated on pt plan of care. Escorted to 64M by chaplain.

## 2014-05-20 NOTE — ED Notes (Signed)
Pt belongings given to daughter.

## 2014-05-20 NOTE — Anesthesia Preprocedure Evaluation (Addendum)
Anesthesia Evaluation  Patient identified by MRN, date of birth, ID band Patient unresponsive    Reviewed: Unable to perform ROS - Chart review onlyPreop documentation limited or incomplete due to emergent nature of procedure.  Airway Mallampati: Intubated       Dental   Pulmonary  breath sounds clear to auscultation        Cardiovascular hypertension, Pt. on medications Rhythm:Regular Rate:Normal     Neuro/Psych Subdural Hematoma    GI/Hepatic negative GI ROS, Neg liver ROS,   Endo/Other  negative endocrine ROS  Renal/GU negative Renal ROS     Musculoskeletal   Abdominal   Peds  Hematology negative hematology ROS (+)   Anesthesia Other Findings   Reproductive/Obstetrics                            Anesthesia Physical Anesthesia Plan  ASA: IV  Anesthesia Plan: General   Post-op Pain Management:    Induction: Intravenous  Airway Management Planned: Oral ETT  Additional Equipment: Arterial line  Intra-op Plan:   Post-operative Plan: Post-operative intubation/ventilation  Informed Consent: I have reviewed the patients History and Physical, chart, labs and discussed the procedure including the risks, benefits and alternatives for the proposed anesthesia with the patient or authorized representative who has indicated his/her understanding and acceptance.     Plan Discussed with: CRNA  Anesthesia Plan Comments:         Anesthesia Quick Evaluation

## 2014-05-20 NOTE — Anesthesia Postprocedure Evaluation (Signed)
  Anesthesia Post-op Note  Patient: Carolyn [name]  Procedure(s) Performed: Procedure(s): CRANIOTOMY HEMATOMA EVACUATION (Left)  Patient Location: ICU  Anesthesia Type:General  Level of Consciousness: Patient remains intubated per anesthesia plan  Airway and Oxygen Therapy: Patient remains intubated per anesthesia plan  Post-op Pain: none  Post-op Assessment: Post-op Vital signs reviewed  Post-op Vital Signs: Reviewed  Last Vitals:  Filed Vitals:   05/20/14 2100  BP: 135/43  Pulse: 72  Temp: 35.6 C  Resp: 16    Complications: No apparent anesthesia complications

## 2014-05-21 ENCOUNTER — Inpatient Hospital Stay (HOSPITAL_COMMUNITY): Payer: BLUE CROSS/BLUE SHIELD

## 2014-05-21 ENCOUNTER — Encounter (HOSPITAL_COMMUNITY): Payer: Self-pay | Admitting: Neurosurgery

## 2014-05-21 DIAGNOSIS — R739 Hyperglycemia, unspecified: Secondary | ICD-10-CM

## 2014-05-21 DIAGNOSIS — E669 Obesity, unspecified: Secondary | ICD-10-CM

## 2014-05-21 DIAGNOSIS — I6789 Other cerebrovascular disease: Secondary | ICD-10-CM

## 2014-05-21 DIAGNOSIS — G936 Cerebral edema: Secondary | ICD-10-CM

## 2014-05-21 LAB — LIPID PANEL
Cholesterol: 242 mg/dL — ABNORMAL HIGH (ref 0–200)
HDL: 61 mg/dL (ref >39)
LDL CALC: 132 mg/dL — AB (ref 0–99)
Total CHOL/HDL Ratio: 4 RATIO
Triglycerides: 246 mg/dL — ABNORMAL HIGH (ref <150)
VLDL: 49 mg/dL — ABNORMAL HIGH (ref 0–40)

## 2014-05-21 LAB — COMPREHENSIVE METABOLIC PANEL
ALK PHOS: 108 U/L (ref 39–117)
ALT: 34 U/L (ref 0–35)
ANION GAP: 11 (ref 5–15)
AST: 73 U/L — ABNORMAL HIGH (ref 0–37)
Albumin: 3.6 g/dL (ref 3.5–5.2)
BUN: 10 mg/dL (ref 6–23)
CALCIUM: 8.3 mg/dL — AB (ref 8.4–10.5)
CHLORIDE: 102 mmol/L (ref 96–112)
CO2: 23 mmol/L (ref 19–32)
Creatinine, Ser: 0.73 mg/dL (ref 0.50–1.10)
GFR calc Af Amer: >90 mL/min (ref >90)
GFR calc non Af Amer: 86 mL/min — ABNORMAL LOW (ref >90)
GLUCOSE: 163 mg/dL — AB (ref 70–99)
Potassium: 5.9 mmol/L — ABNORMAL HIGH (ref 3.5–5.1)
Sodium: 136 mmol/L (ref 135–145)
TOTAL PROTEIN: 6.6 g/dL (ref 6.0–8.3)
Total Bilirubin: 1.1 mg/dL (ref 0.3–1.2)

## 2014-05-21 LAB — BASIC METABOLIC PANEL
Anion gap: 10 (ref 5–15)
BUN: 7 mg/dL (ref 6–23)
CO2: 24 mmol/L (ref 19–32)
Calcium: 8.7 mg/dL (ref 8.4–10.5)
Chloride: 104 mmol/L (ref 96–112)
Creatinine, Ser: 0.62 mg/dL (ref 0.50–1.10)
GFR calc Af Amer: >90 mL/min (ref >90)
GFR calc non Af Amer: >90 mL/min (ref >90)
Glucose, Bld: 155 mg/dL — ABNORMAL HIGH (ref 70–99)
Potassium: 3.7 mmol/L (ref 3.5–5.1)
Sodium: 138 mmol/L (ref 135–145)

## 2014-05-21 LAB — PREPARE PLATELET PHERESIS: Unit division: 0

## 2014-05-21 LAB — TSH: TSH: 0.825 u[IU]/mL (ref 0.350–4.500)

## 2014-05-21 LAB — CBC
HCT: 34.3 % — ABNORMAL LOW (ref 36.0–46.0)
HEMOGLOBIN: 11 g/dL — AB (ref 12.0–15.0)
MCH: 27.3 pg (ref 26.0–34.0)
MCHC: 32.1 g/dL (ref 30.0–36.0)
MCV: 85.1 fL (ref 78.0–100.0)
Platelets: 302 10*3/uL (ref 150–400)
RBC: 4.03 MIL/uL (ref 3.87–5.11)
RDW: 15.8 % — ABNORMAL HIGH (ref 11.5–15.5)
WBC: 17.4 10*3/uL — AB (ref 4.0–10.5)

## 2014-05-21 LAB — GLUCOSE, CAPILLARY
GLUCOSE-CAPILLARY: 172 mg/dL — AB (ref 70–99)
GLUCOSE-CAPILLARY: 149 mg/dL — AB (ref 70–99)
GLUCOSE-CAPILLARY: 148 mg/dL — AB (ref 70–99)
Glucose-Capillary: 132 mg/dL — ABNORMAL HIGH (ref 70–99)
Glucose-Capillary: 129 mg/dL — ABNORMAL HIGH (ref 70–99)

## 2014-05-21 LAB — PROTIME-INR
INR: 0.95 (ref 0.00–1.49)
Prothrombin Time: 12.8 seconds (ref 11.6–15.2)

## 2014-05-21 LAB — T4, FREE: FREE T4: 1.36 ng/dL (ref 0.80–1.80)

## 2014-05-21 MED ORDER — AMLODIPINE BESYLATE 10 MG PO TABS
10.0000 mg | ORAL_TABLET | Freq: Every day | ORAL | Status: DC
  Administered 2014-05-21: 10 mg via ORAL
  Filled 2014-05-21: qty 1

## 2014-05-21 MED ORDER — SODIUM CHLORIDE 0.9 % IJ SOLN
10.0000 mL | Freq: Two times a day (BID) | INTRAMUSCULAR | Status: DC
  Administered 2014-05-21 – 2014-05-23 (×3): 10 mL
  Administered 2014-05-23: 30 mL
  Administered 2014-05-24 – 2014-05-25 (×4): 10 mL
  Administered 2014-05-26 – 2014-05-27 (×3): 20 mL
  Administered 2014-05-27 – 2014-05-29 (×5): 10 mL
  Administered 2014-05-30 (×2): 20 mL
  Administered 2014-05-31: 30 mL
  Administered 2014-06-01 (×2): 10 mL
  Administered 2014-06-01: 30 mL
  Administered 2014-06-02 (×2): 10 mL
  Administered 2014-06-03: 20 mL

## 2014-05-21 MED ORDER — CHLORHEXIDINE GLUCONATE CLOTH 2 % EX PADS
6.0000 | MEDICATED_PAD | Freq: Every day | CUTANEOUS | Status: AC
  Administered 2014-05-21 – 2014-05-25 (×5): 6 via TOPICAL

## 2014-05-21 MED ORDER — PANTOPRAZOLE SODIUM 40 MG PO PACK
40.0000 mg | PACK | Freq: Every day | ORAL | Status: DC
  Administered 2014-05-21 – 2014-05-29 (×9): 40 mg
  Filled 2014-05-21 (×13): qty 20

## 2014-05-21 MED ORDER — CLEVIDIPINE BUTYRATE 0.5 MG/ML IV EMUL
0.0000 mg/h | INTRAVENOUS | Status: DC
  Administered 2014-05-21: 5 mg/h via INTRAVENOUS
  Administered 2014-05-21: 2 mg/h via INTRAVENOUS
  Administered 2014-05-21: 3 mg/h via INTRAVENOUS
  Administered 2014-05-22: 2 mg/h via INTRAVENOUS
  Administered 2014-05-22: 5 mg/h via INTRAVENOUS
  Filled 2014-05-21 (×54): qty 50

## 2014-05-21 MED ORDER — LEVOTHYROXINE SODIUM 75 MCG PO TABS
75.0000 ug | ORAL_TABLET | Freq: Every day | ORAL | Status: DC
  Administered 2014-05-22 – 2014-05-30 (×9): 75 ug via ORAL
  Filled 2014-05-21 (×10): qty 1

## 2014-05-21 MED ORDER — SODIUM CHLORIDE 0.9 % IV SOLN
INTRAVENOUS | Status: DC
  Administered 2014-05-21 – 2014-05-30 (×3): via INTRAVENOUS
  Administered 2014-05-31: 1000 mL via INTRAVENOUS
  Administered 2014-06-01 – 2014-06-03 (×3): via INTRAVENOUS

## 2014-05-21 MED ORDER — INSULIN ASPART 100 UNIT/ML ~~LOC~~ SOLN
0.0000 [IU] | SUBCUTANEOUS | Status: DC
  Administered 2014-05-21 – 2014-05-30 (×34): 2 [IU] via SUBCUTANEOUS

## 2014-05-21 MED ORDER — SODIUM CHLORIDE 0.9 % IJ SOLN: 10.0000 mL | INTRAMUSCULAR | Status: DC | PRN

## 2014-05-21 MED ORDER — MUPIROCIN 2 % EX OINT
1.0000 "application " | TOPICAL_OINTMENT | Freq: Two times a day (BID) | CUTANEOUS | Status: AC
  Administered 2014-05-21 – 2014-05-25 (×10): 1 via NASAL
  Filled 2014-05-21: qty 22

## 2014-05-21 MED ORDER — AMLODIPINE BESYLATE 10 MG PO TABS
10.0000 mg | ORAL_TABLET | Freq: Every day | ORAL | Status: DC
  Administered 2014-05-22: 10 mg
  Filled 2014-05-21: qty 1

## 2014-05-21 MED ORDER — LISINOPRIL 20 MG PO TABS
20.0000 mg | ORAL_TABLET | Freq: Every day | ORAL | Status: DC
  Administered 2014-05-21 – 2014-05-23 (×3): 20 mg via ORAL
  Filled 2014-05-21 (×3): qty 1

## 2014-05-21 NOTE — Progress Notes (Addendum)
Initial Nutrition Assessment  DOCUMENTATION CODES:  Obesity unspecified  INTERVENTION:  If TF started, recommend Vital High Protein at 10 ml/hr with Prostat liquid protein 30 ml 5 times daily via tube to provide 740 kcals, 96 gm protein (87% of estimated protein needs), 201 ml of free water.  Above TF regimen plus current Propofol infusion will meet 100% of estimated kcal needs.  Unable to meet 100% of estimated protein needs given Propofol use.  NUTRITION DIAGNOSIS:  Inadequate oral intake related to inability to eat as evidenced by NPO status  GOAL:  Provide needs based on ASPEN/SCCM guidelines  MONITOR:  Vent status, Weight trends, Labs, I & O's (TF initiation & tolerance)  REASON FOR ASSESSMENT:  Ventilator    ASSESSMENT: Carolyn [name] with PMH of htn and [facility] ED 4/28 with L hemispheric hemorrhagic CVA. Intubated in ED due to depressed LOC and [facility] and NS consultants.   Patient s/p procedures  LEFT FRONTOPARIETAL CRANIOTOMY FOR EVACUATION OF INTRACEREBRAL HEMATOMA  RD unable to obtain nutrition hx.  No problems identified PTA.  Patient is currently intubated on ventilator support -- OGT in place MV: 11 L/min Temp (24hrs), Avg:97.5 [name] (36.4 C), Min:96 [name] (35.6 C), Max:98.7 [name] (37.1 C)   Propofol: 25.3 ml/hr ----> 668 fat kcals  Height:  Ht Readings from Last 1 Encounters:  05/20/14 5\' 4"  (1.626 m)    Weight:  Wt Readings from Last 1 Encounters:  05/21/14 223 lb 15.8 oz (101.6 kg)    Ideal Body Weight:  54.5 kg  Wt Readings from Last 10 Encounters:  05/21/14 223 lb 15.8 oz (101.6 kg)    BMI:  Body mass index is 38.43 kg/(m^2).  Estimated Nutritional Needs:  Kcal:  [id number]  Protein:  110-120 gm  Fluid:  per MD  Skin:  Reviewed, no issues  Diet Order:  NPO  EDUCATION NEEDS:  No education needs identified at this time   Intake/Output Summary (Last 24 hours) at  05/21/14 1353 Last data filed at 05/21/14 1300  Gross per 24 hour  Intake 4602.27 ml  Output   4460 ml  Net 142.27 ml    Last BM:  unknown  Carolyn [name], RD, LDN Pager #: [phone] After-Hours Pager #: [phone]

## 2014-05-21 NOTE — Progress Notes (Signed)
UR completed.  Atiba Kimberlin, RN BSN [facility] CCM Trauma/Neuro ICU Case Manager [phone]  

## 2014-05-21 NOTE — Progress Notes (Signed)
PULMONARY  / CRITICAL CARE MEDICINE  Name: Carolyn [name] MRN: [id number] DOB: [birth year]    LOS: 1  REFERRING MD :  Dr. Roseanne RenoStewart  CHIEF COMPLAINT:  ICH  BRIEF PATIENT DESCRIPTION:  [age] F with PMH of htn and [facility] ED 4/28 with L hemispheric hemorrhagic CVA. Intubated in ED due to depressed LOC and [facility] with Stroke and NS consultants. 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    MAJOR EVENTS/TEST RESULTS: 4/28 [facility] Stroke team consultation. NS consultation 4/28 CT head: Large left frontal parietal intracerebral hemorrhage with a 7.5 mm of midline shift. The hemorrhage has extended into the left lateral ventricle 4/28 Procedure: Left frontoparietal craniotomy for evacuation of intracerebral hematoma. Surgeon: [name] 4/29 CT head: Interval decompression of the left-sided cerebral hemorrhage, with subdural drain in place along the craniotomy site. The hematoma is significantly reduced in volume. There is gas in the center of the hematoma and a small amount of gas and blood products in the extra-axial space on the left. Intraventricular hemorrhage in both lateral ventricles and in the third and fourth ventricle, without hydrocephalus. Faintly increased density along the right temporal lobe is compatible with a small amount of new subarachnoid hemorrhage along the right temporal lobe. 3 mm of left-to-right midline shift 4/29 Remains intubated, sedated, on nicardipine, propofol 4/29 TTE:  4/30 CT head:   INDWELLING DEVICES:: ETT 4/28 >>  R radial A-line 4/28 >>  PICC (ordered) 4/29 >>   MICRO DATA: MRSA PCR 4/28 >> POS  ANTIMICROBIALS:     INTERVAL HISTORY:  Remains intubated, sedated, on nicardipine, propofol  VITAL SIGNS: Temp:  [96 F (35.6 C)-98.7 F (37.1 C)] 98.1 F (36.7 C) (04/29 0746) Pulse Rate:  [52-95] 69 (04/29 1000) Resp:  [13-38] 20 (04/29  1000) BP: (104-241)/(31-113) 139/63 mmHg (04/29 1008) SpO2:  [91 %-100 %] 99 % (04/29 1000) Arterial Line BP: (159-215)/(59-84) 168/63 mmHg (04/29 1000) FiO2 (%):  [40 %] 40 % (04/29 0743) Weight:  [100.4 kg (221 lb 5.5 oz)-105.325 kg (232 lb 3.2 oz)] 101.6 kg (223 lb 15.8 oz) (04/29 0350) HEMODYNAMICS:   VENTILATOR SETTINGS: Vent Mode:  [-] CPAP;PSV FiO2 (%):  [40 %] 40 % Set Rate:  [16 bmp] 16 bmp Vt Set:  [440 mL-500 mL] 440 mL PEEP:  [5 cmH20] 5 cmH20 Pressure Support:  [10 cmH20] 10 cmH20 Plateau Pressure:  [16 cmH20-17 cmH20] 16 cmH20 INTAKE / OUTPUT: Intake/Output      04/28 0701 - 04/29 0700 04/29 0701 - 04/30 0700   I.V. (mL/kg) 1258.7 (12.4) 2696.6 (26.5)   Blood 238    NG/GT  55   Total Intake(mL/kg) 1496.7 (14.7) 2751.6 (27.1)   Urine (mL/kg/hr) 2875 655 (1.6)   Emesis/NG output 250    Drains 45    Blood 400    Total Output 3570 655   Net -2073.3 +2096.6        Emesis Occurrence 1 x      PHYSICAL EXAMINATION: General:  RASS -2 to -3, not F/C Neuro: R hemiplegia HEENT: Surgical dressing in place Cardiovascular:  Reg, no M Lungs: clear anteriorly Abdomen:  Obese, soft, +BS Ext: warm, no edema  LABS: Cbc  Recent Labs Lab 05/20/14 1343 05/20/14 1356 05/20/14 2201 05/21/14 0400  WBC 9.7  --  17.0* 17.4*  HGB 12.7 14.3 12.0 11.0*  HCT 40.0 42.0 38.4 34.3*  PLT 234  --  247 302  Chemistry   Recent Labs Lab 05/20/14 2201 05/21/14 0400 05/21/14 0703  NA 141 136 138  K 3.9 5.9* 3.7  CL 105 102 104  CO2 BUN CREATININE 0.73 0.73 0.62  CALCIUM 8.4 8.3* 8.7  GLUCOSE 150* 163* 155*    Liver fxn  Recent Labs Lab 05/20/14 1343 05/21/14 0400  AST 28 73*  ALT 29 34  ALKPHOS 125* 108  BILITOT 0.6 1.1  PROT 6.7 6.6  ALBUMIN 3.9 3.6   coags  Recent Labs Lab 05/20/14 1343 05/21/14 0400  APTT 30  --   INR 0.91 0.95   Sepsis markers No results for input(s): LATICACIDVEN, PROCALCITON in the last 168  hours. Cardiac markers No results for input(s): CKTOTAL, CKMB, TROPONINI in the last 168 hours. BNP No results for input(s): PROBNP in the last 168 hours. ABG  Recent Labs Lab 05/20/14 1356 05/20/14 2155  PHART  --  7.368  PCO2ART  --  41.7  PO2ART  --  74.9*  HCO3  --  23.7  TCO2 24 25.0    CBG trend  Recent Labs Lab 05/20/14 2055 05/20/14 2328 05/21/14 0316  GLUCAP 129* 166* 172*    CXR: bibasilar atx   ASSESSMENT / PLAN:  PULMONARY ASSESSMENT: Acute respiratory failure due to CVA, AMS PLAN:   Cont full vent support - settings reviewed and/or adjusted Cont vent bundle Daily SBT if/when meets criteria   CARDIOVASCULAR  ASSESSMENT:  Hypertensive emergency PLAN:  Change nicardipine to clevidipine gtt per Stroke team Amlodipine and lisinopril initiated 4/29 per Stroke tema SBP goal < 140 mmHg  RENAL  ASSESSMENT:  Hypokalemia, resolved Hyperkalemia, resolved PLAN:   Monitor BMET intermittently Monitor I/Os Correct electrolytes as indicated  GASTROINTESTINAL  ASSESSMENT:  No acute issues PLAN:   SUP: enteral PPI Begin TFs 4/29  HEMATOLOGIC  ASSESSMENT:   Plavix, ASA prior to admission (indication: recent AAA stent)  Received plt transfusion in ED PLAN:  DVT px: SCDs Monitor CBC intermittently Transfuse per usual ICU guidelines  INFECTIOUS  ASSESSMENT: No evidence of infection PLAN:   Monitor temp, WBC count Micro and abx as above   ENDOCRINE  ASSESSMENT:   Hypothyroidism (L-thyroxine 75 mcg daily PTA) Hyperglycemia without prior DM history PLAN:   Cont L-thyroxine Begin SSI 4/29  NEUROLOGIC  ASSESSMENT:   Hypertensive LCH S/P craniotomy 4/28 Post op pain ICU associated discomfort PLAN:   RASS goal: -2,-3 Cont propofol gtt Cont PRN fent Daily WUA Post op mgmt per NS Further stroke eval per Stroke team   35 mins CCM time  Billy Fischer, MD ; [facility] service Mobile [phone].  After 5:30 PM or weekends,  call [phone]

## 2014-05-21 NOTE — Progress Notes (Signed)
eLink Physician-Brief Progress Note Patient Name: Carolyn [name] DOB: April 11, 1945 MRN: [id number]   Date of Service  05/21/2014  HPI/Events of Note  K+ = 5.9 and Creatinine = 0.73. Hemolysis of specimen?  eICU Interventions  Will repeat BMP.      Intervention Category Intermediate Interventions: Electrolyte abnormality - evaluation and management  Sommer,Steven Eugene 05/21/2014, 5:36 AM

## 2014-05-21 NOTE — Progress Notes (Signed)
  Echocardiogram 2D Echocardiogram has been performed.  Janalyn HarderWest, Printice Hellmer R 05/21/2014, 3:06 PM

## 2014-05-21 NOTE — Progress Notes (Signed)
PT Cancellation Note  Patient Details Name: Carolyn [name] MRN: [id number] DOB: 1945-12-23   Cancelled Treatment:    Reason Eval/Treat Not Completed: Patient not medically ready.  Wean from sedation attempted, but pt needed to be sedated again.  Will try evaluation 4/30 as able. 05/21/2014  Bartlett BingKen Sampson [name], PT [phone]  (pager)   Carolyn [name], Carolyn [name] 05/21/2014, 12:23 PM

## 2014-05-21 NOTE — Progress Notes (Addendum)
STROKE TEAM PROGRESS NOTE   HISTORY Carolyn [name] is a 69 y.o. female brought to hospital as code stroke. EMS was called to her work and told she was noted to have right arm weakness then noted to complain of severe HA which then was followed by sudden unconsciousness. On arrival she was unconscious on NRB but not protectin g airway well. CT head was obtained and showed a large left parietal ICH. While in ED patient has one episode emesis and intubated for air way protection. NIH stroke score was 31. Blood pressure was markedly elevated, and at one point was 241/123. Emergency management of hypertension was undertaken with use of Cardene intravenously.  Date last known well: Date: 05/20/2014 Time last known well: Time: 12:40 tPA Given: No: ICH Modified Rankin:    SUBJECTIVE (INTERVAL HISTORY) No family is at the bedside.  Pt was off sedation for 10 min but started to be agitated and BP was high. Has to increase sedation and cardene drip. S/p hematoma evacuation and repeat CT this am showed minimal midline shift. Will repeat CT tomorrow am.    OBJECTIVE Temp:  [96 F (35.6 C)-98.7 F (37.1 C)] 98.7 F (37.1 C) (04/29 0333) Pulse Rate:  [52-95] 75 (04/29 0600) Cardiac Rhythm:  [-] Normal sinus rhythm (04/28 2100) Resp:  [13-38] 38 (04/29 0600) BP: (104-241)/(31-113) 104/31 mmHg (04/29 0600) SpO2:  [91 %-100 %] 100 % (04/29 0600) Arterial Line BP: (159-215)/(59-84) 196/79 mmHg (04/29 0600) FiO2 (%):  [40 %] 40 % (04/29 0011) Weight:  [100.4 kg (221 lb 5.5 oz)-105.325 kg (232 lb 3.2 oz)] 101.6 kg (223 lb 15.8 oz) (04/29 0350)   Recent Labs Lab 05/20/14 2055 05/20/14 2328 05/21/14 0316  GLUCAP 129* 166* 172*    Recent Labs Lab 05/20/14 1343 05/20/14 1356 05/20/14 2201 05/21/14 0400  NA 139 140 141 136  K 3.4* 3.4* 3.9 5.9*  CL 105 106 105 102  CO2 25  --  23 23  GLUCOSE 118* 125* 150* 163*  BUN CREATININE 0.62 0.60 0.73 0.73  CALCIUM 9.3  --  8.4 8.3*     Recent Labs Lab 05/20/14 1343 05/21/14 0400  AST 28 73*  ALT 29 34  ALKPHOS 125* 108  BILITOT 0.6 1.1  PROT 6.7 6.6  ALBUMIN 3.9 3.6    Recent Labs Lab 05/20/14 1343 05/20/14 1356 05/20/14 2201 05/21/14 0400  WBC 9.7  --  17.0* 17.4*  NEUTROABS 6.3  --   --   --   HGB 12.7 14.3 12.0 11.0*  HCT 40.0 42.0 38.4 34.3*  MCV 85.5  --  86.7 85.1  PLT 234  --  247 302   No results for input(s): CKTOTAL, CKMB, CKMBINDEX, TROPONINI in the last 168 hours.  Recent Labs  05/20/14 1343 05/21/14 0400  LABPROT 12.4 12.8  INR 0.91 0.95    Recent Labs  05/20/14 1513  COLORURINE YELLOW  LABSPEC 1.017  PHURINE 7.5  GLUCOSEU NEGATIVE  HGBUR NEGATIVE  BILIRUBINUR NEGATIVE  KETONESUR NEGATIVE  PROTEINUR 100*  UROBILINOGEN 0.2  NITRITE NEGATIVE  LEUKOCYTESUR NEGATIVE    No results found for: CHOL, TRIG, HDL, CHOLHDL, VLDL, LDLCALC No results found for: HGBA1C    Component Value Date/Time   LABOPIA NONE DETECTED 05/20/2014 1513   COCAINSCRNUR NONE DETECTED 05/20/2014 1513   LABBENZ NONE DETECTED 05/20/2014 1513   AMPHETMU NONE DETECTED 05/20/2014 1513   THCU NONE DETECTED 05/20/2014 1513   LABBARB NONE DETECTED 05/20/2014 1513  Recent Labs Lab 05/20/14 1343  ETH <5   I have personally reviewed the radiological images below and agree with the radiology interpretations.  Ct Head Wo Contrast  05/21/2014   IMPRESSION: 1. Interval decompression of the left-sided cerebral hemorrhage, with subdural drain in place along the craniotomy site. The hematoma is significantly reduced in volume. There is gas in the center of the hematoma and a small amount of gas and blood products in the extra-axial space on the left. 2. Intraventricular hemorrhage in both lateral ventricles and in the third and fourth ventricle, without hydrocephalus. 3. Faintly increased density along the right temporal lobe is compatible with a small amount of new subarachnoid hemorrhage along the right  temporal lobe. 4. 3 mm of left-to-right midline shift.     05/20/2014   IMPRESSION: Large left frontal parietal intracerebral hemorrhage with a 7.5 mm of midline shift. The hemorrhage has extended into the left lateral ventricle.    Dg Chest Port 1 View  05/21/2014    IMPRESSION: NG tube placed.  Stable bibasilar atelectasis.  No evidence of pulmonary edema.     05/20/2014   IMPRESSION: Endotracheal tube tip at the carina with the tip directed toward the right main bronchus. No edema or consolidation. No pneumothorax.    2D echo - pending   PHYSICAL EXAM  Temp:  [96 F (35.6 C)-98.7 F (37.1 C)] 98.7 F (37.1 C) (04/29 1137) Pulse Rate:  [52-95] 83 (04/29 1300) Resp:  [13-38] 22 (04/29 1300) BP: (104-241)/(31-113) 133/57 mmHg (04/29 1300) SpO2:  [96 %-100 %] 100 % (04/29 1300) Arterial Line BP: (72-215)/(59-84) 72/59 mmHg (04/29 1300) FiO2 (%):  [40 %] 40 % (04/29 1141) Weight:  [221 lb 5.5 oz (100.4 kg)-232 lb 3.2 oz (105.325 kg)] 223 lb 15.8 oz (101.6 kg) (04/29 0350)  General - Well nourished, well developed, intubated and agitated.  Ophthalmologic - fundi not visualized due to incorporation.  Cardiovascular - Regular rate and rhythm with no murmur.  Neuro - intubated and off sedation, pt became agitated and BP was high. Not open eyes on voice and not following commands. Left head dressing dry and JP drain patent. PERRL, pupil 3mm reactive to light, doll's eye present, corneal reflex and gag and cough reflex weakly positive. Breathing over the vent. Spontaneously moving LUE and LLE, but 0/5 RUE on pain and trace withdraw on RLE on pain stimulation, no babinski bilaterally. Reflex 1+.   ASSESSMENT/PLAN Carolyn [name] is a 69 y.o. female with history of hypertension presenting with right arm weakness, severe headache, and subsequent unconsciousness.  She did not receive IV t-PA due to a large left frontal parietal intracerebral hemorrhage.  Stroke:  Dominant parietal  intracerebral hemorrhage s/p hematoma evacuation.   Resultant  Intubated and right hemiplegia  Repeat CT this am showed smaller hematoma and decreased midline shift  2D Echo pending  LDL pending  HgbA1c pending  SCDs for VTE prophylaxis, may consider heparin subq in the next a couple of days.  Diet NPO time specified  aspirin 81 mg orally every day and clopidogrel 75 mg orally every day prior to admission, now on no antithrombotic  Ongoing aggressive stroke risk factor management  Therapy recommendations: Pending  Disposition: Pending  Cerebral edema  Midline shift improved s/p hematoma evacuation  Repeat CT in am   May consider hypertonic saline if worsening midline shift  Neuro check  Hypertension  Home meds: Toprol-XL  Unstable  Switch to cleviplex drip  Put on po BP meds  in hope to wean off drip  BP goal < 140  Close monitoring  Hyperlipidemia  LDL pending, goal < 70  Other Stroke Risk Factors  Advanced age  Obesity, Body mass index is 38.43 kg/(m^2).    Other Active Problems  Intubated  Hyperkalemia  hyperglycemia  Mild anemia  Other Pertinent History  History of abdominal aortic aneurysm repair  Hospital day # 1  This patient is critically ill due to large ICH, respiratory failure, HTN not in control and at significant risk of neurological worsening, death form hematoma expansion, cerebral edema and brain herniation, seizure. This patient's care requires constant monitoring of vital signs, hemodynamics, respiratory and cardiac monitoring, review of multiple databases, neurological assessment, discussion with family, other specialists and medical decision making of high complexity. I spent 40 minutes of neurocritical care time in the care of this patient.   Marvel Plan, MD PhD Stroke Neurology 05/21/2014 2:09 PM   To contact Stroke Continuity provider, please refer to [email or web address]. After hours, contact General Neurology

## 2014-05-21 NOTE — Progress Notes (Signed)
Patient ID: Carolyn CourtGeraldine Branscomb, female   DOB: 08-Jun-1945, 69 y.o.   MRN: [id number] Afeb, vss, though BP a bit labile Moves left side spontaneously, though right side remains plegic. Drain working well CT this am looks much better than pre op. Will continue supportive care and hope for improvement over next few days. I am away next 4 days, and Dr Franky Machocabbell will be following her during my absence.

## 2014-05-21 NOTE — Progress Notes (Signed)
Patient transported to CT and back to 3M05 withoutt any complications.

## 2014-05-21 NOTE — Progress Notes (Signed)
Peripherally Inserted Central Catheter/Midline Placement  The IV Nurse has discussed with the patient and/or persons authorized to consent for the patient, the purpose of this procedure and the potential benefits and risks involved with this procedure.  The benefits include less needle sticks, lab draws from the catheter and patient may be discharged home with the catheter.  Risks include, but not limited to, infection, bleeding, blood clot (thrombus formation), and puncture of an artery; nerve damage and irregular heat beat.  Alternatives to this procedure were also discussed.  PICC/Midline Placement Documentation  PICC Triple Lumen 05/21/14 PICC Left Basilic 45 cm 2 cm (Active)  Indication for Insertion or Continuance of Line Vasoactive infusions 05/21/2014  2:15 PM  Exposed Catheter (cm) 2 cm 05/21/2014  2:15 PM  Site Assessment Clean;Dry;Intact 05/21/2014  2:15 PM  Lumen #1 Status Flushed;Saline locked;Blood return noted 05/21/2014  2:15 PM  Lumen #2 Status Flushed;Saline locked;Blood return noted 05/21/2014  2:15 PM  Lumen #3 Status Flushed;Saline locked;Blood return noted 05/21/2014  2:15 PM  Dressing Type Transparent 05/21/2014  2:15 PM  Dressing Intervention New dressing 05/21/2014  2:15 PM  Dressing Change Due 05/28/14 05/21/2014  2:15 PM       Ethelda Chickurrie, Lauralee Waters Robert 05/21/2014, 2:17 PM

## 2014-05-22 ENCOUNTER — Inpatient Hospital Stay (HOSPITAL_COMMUNITY): Payer: BLUE CROSS/BLUE SHIELD

## 2014-05-22 LAB — CBC
HEMATOCRIT: 32 % — AB (ref 36.0–46.0)
HEMOGLOBIN: 10 g/dL — AB (ref 12.0–15.0)
MCH: 27 pg (ref 26.0–34.0)
MCHC: 31.3 g/dL (ref 30.0–36.0)
MCV: 86.3 fL (ref 78.0–100.0)
Platelets: 275 10*3/uL (ref 150–400)
RBC: 3.71 MIL/uL — ABNORMAL LOW (ref 3.87–5.11)
RDW: 16.1 % — ABNORMAL HIGH (ref 11.5–15.5)
WBC: 15.5 10*3/uL — ABNORMAL HIGH (ref 4.0–10.5)

## 2014-05-22 LAB — HEMOGLOBIN A1C
Hgb A1c MFr Bld: 5.9 % — ABNORMAL HIGH (ref 4.8–5.6)
MEAN PLASMA GLUCOSE: 123 mg/dL

## 2014-05-22 LAB — GLUCOSE, CAPILLARY
GLUCOSE-CAPILLARY: 136 mg/dL — AB (ref 70–99)
GLUCOSE-CAPILLARY: 136 mg/dL — AB (ref 70–99)
GLUCOSE-CAPILLARY: 124 mg/dL — AB (ref 70–99)
Glucose-Capillary: 119 mg/dL — ABNORMAL HIGH (ref 70–99)
Glucose-Capillary: 118 mg/dL — ABNORMAL HIGH (ref 70–99)
Glucose-Capillary: 128 mg/dL — ABNORMAL HIGH (ref 70–99)

## 2014-05-22 LAB — BASIC METABOLIC PANEL
Anion gap: 8 (ref 5–15)
BUN: 12 mg/dL (ref 6–23)
CO2: 28 mmol/L (ref 19–32)
CREATININE: 0.72 mg/dL (ref 0.50–1.10)
Calcium: 8.9 mg/dL (ref 8.4–10.5)
Chloride: 103 mmol/L (ref 96–112)
GFR calc Af Amer: >90 mL/min (ref >90)
GFR calc non Af Amer: 86 mL/min — ABNORMAL LOW (ref >90)
Glucose, Bld: 142 mg/dL — ABNORMAL HIGH (ref 70–99)
Potassium: 3.4 mmol/L — ABNORMAL LOW (ref 3.5–5.1)
Sodium: 139 mmol/L (ref 135–145)

## 2014-05-22 MED ORDER — SODIUM CHLORIDE 0.9 % IV SOLN
1.0000 g | Freq: Once | INTRAVENOUS | Status: AC
  Administered 2014-05-22: 1 g via INTRAVENOUS
  Filled 2014-05-22: qty 10

## 2014-05-22 MED ORDER — CALCIUM CHLORIDE 10 % IV SOLN: 1.0000 g | Freq: Once | INTRAVENOUS | Status: DC

## 2014-05-22 MED ORDER — AMLODIPINE 1 MG/ML ORAL SUSPENSION: 5.0000 mg | Freq: Every day | ORAL | Status: DC

## 2014-05-22 MED ORDER — POTASSIUM CHLORIDE 20 MEQ/15ML (10%) PO SOLN
40.0000 meq | Freq: Three times a day (TID) | ORAL | Status: AC
  Administered 2014-05-22 (×2): 40 meq
  Filled 2014-05-22 (×3): qty 30

## 2014-05-22 MED ORDER — SODIUM CHLORIDE 0.9 % IV SOLN
25.0000 ug/h | INTRAVENOUS | Status: DC
  Administered 2014-05-22: 25 ug/h via INTRAVENOUS
  Administered 2014-05-23: 50 ug/h via INTRAVENOUS
  Filled 2014-05-22 (×2): qty 50

## 2014-05-22 MED ORDER — VITAL HIGH PROTEIN PO LIQD
1000.0000 mL | ORAL | Status: DC
  Administered 2014-05-22 – 2014-05-24 (×4): 1000 mL
  Administered 2014-05-25: 16:00:00
  Administered 2014-05-25: 1000 mL
  Administered 2014-05-25 – 2014-05-26 (×2)
  Administered 2014-05-26 – 2014-05-30 (×5): 1000 mL
  Administered 2014-05-30: 08:00:00
  Filled 2014-05-22 (×12): qty 1000

## 2014-05-22 MED ORDER — DEXMEDETOMIDINE HCL IN NACL 200 MCG/50ML IV SOLN
0.4000 ug/kg/h | INTRAVENOUS | Status: DC
  Administered 2014-05-22: 0.4 ug/kg/h via INTRAVENOUS
  Filled 2014-05-22: qty 50

## 2014-05-22 MED ORDER — DOPAMINE-DEXTROSE 3.2-5 MG/ML-% IV SOLN
0.0000 ug/kg/min | INTRAVENOUS | Status: DC
  Administered 2014-05-22: 2.5 ug/kg/min via INTRAVENOUS
  Filled 2014-05-22: qty 250

## 2014-05-22 MED ORDER — AMLODIPINE 1 MG/ML ORAL SUSPENSION
10.0000 mg | Freq: Every day | ORAL | Status: DC
  Administered 2014-05-22 – 2014-05-23 (×2): 10 mg
  Filled 2014-05-22 (×3): qty 10

## 2014-05-22 NOTE — Progress Notes (Signed)
Transported patient to C.T while on the ventilator. Patient remained stable during transport  

## 2014-05-22 NOTE — Progress Notes (Signed)
PULMONARY  / CRITICAL CARE MEDICINE  Name: Carolyn [name] MRN: [id number] DOB: 1946-01-15    LOS: 2  REFERRING MD :  Dr. Roseanne RenoStewart  CHIEF COMPLAINT:  ICH  BRIEF PATIENT DESCRIPTION:  [age] F with PMH of htn and [facility] ED [facility]/28 with L hemispheric hemorrhagic CVA. Intubated in ED due to depressed LOC [facility] and NS consultants. 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    MAJOR EVENTS/TEST RESULTS: [facility] Stroke team consultation. NS consultation 4/28 CT head: Large left frontal parietal intracerebral hemorrhage with a 7.5 mm of midline shift. The hemorrhage has extended into the left lateral ventricle 4/28 Procedure: Left frontoparietal craniotomy for evacuation of intracerebral hematoma. Surgeon: [name] 4/29 CT head: Interval decompression of the left-sided cerebral hemorrhage, with subdural drain in place along the craniotomy site. The hematoma is significantly reduced in volume. There is gas in the center of the hematoma and a small amount of gas and blood products in the extra-axial space on the left. Intraventricular hemorrhage in both lateral ventricles and in the third and fourth ventricle, without hydrocephalus. Faintly increased density along the right temporal lobe is compatible with a small amount of new subarachnoid hemorrhage along the right temporal lobe. 3 mm of left-to-right midline shift 4/29 Remains intubated, sedated, on nicardipine, propofol 4/29 TTE:  4/30 CT head:   INDWELLING DEVICES:: ETT 4/28 >>  R radial A-line 4/28 >>  PICC (ordered) 4/29 >>   MICRO DATA: MRSA PCR 4/28 >> POS  ANTIMICROBIALS:   None   INTERVAL HISTORY:  Moving left normally, agitated on right.  VITAL SIGNS: Temp:  [97.7 F (36.5 C)-99.6 F (37.6 C)] 98.8 F (37.1 C) (04/30 0752) Pulse Rate:  [57-95] 59 (04/30 1030) Resp:  [14-31] 16 (04/30 1030) BP:  (103-150)/(28-62) 126/49 mmHg (04/30 1030) SpO2:  [98 %-100 %] 98 % (04/30 1030) Arterial Line BP: (72-181)/(43-94) 146/46 mmHg (04/30 1030) FiO2 (%):  [30 %-40 %] 30 % (04/30 1000) Weight:  [100.3 kg (221 lb 1.9 oz)] 100.3 kg (221 lb 1.9 oz) (04/30 0448) HEMODYNAMICS:   VENTILATOR SETTINGS: Vent Mode:  [-] CPAP;PSV FiO2 (%):  [30 %-40 %] 30 % Set Rate:  [16 bmp] 16 bmp Vt Set:  [440 mL] 440 mL PEEP:  [5 cmH20] 5 cmH20 Pressure Support:  [5 cmH20-8 cmH20] 5 cmH20 Plateau Pressure:  [14 cmH20-15 cmH20] 15 cmH20 INTAKE / OUTPUT: Intake/Output      04/29 0701 - 04/30 0700 04/30 0701 - 05/01 0700   I.V. (mL/kg) 3753.6 (37.4) 100.7 (1)   Blood     NG/GT 55 30   IV Piggyback 205 105   Total Intake(mL/kg) 4013.6 (40) 235.7 (2.3)   Urine (mL/kg/hr) 1675 (0.7)    Emesis/NG output 125 (0.1)    Drains 35 (0)    Blood     Total Output 1835     Net +2178.6 +235.7          PHYSICAL EXAMINATION: General:  RASS -2, not F/C Neuro: R hemiplegia improving, left side normal. HEENT: Surgical dressing in place Cardiovascular:  Reg, no M Lungs: clear anteriorly Abdomen:  Obese, soft, +BS Ext: warm, no edema  LABS: Cbc  Recent Labs Lab 05/20/14 2201 05/21/14 0400 05/22/14 0515  WBC 17.0* 17.4* 15.5*  HGB 12.0 11.0* 10.0*  HCT 38.4 34.3* 32.0*  PLT 247 302 275    Chemistry   Recent Labs  Lab 05/21/14 0400 05/21/14 0703 05/22/14 0515  NA 136 138 139  K 5.9* 3.7 3.4*  CL 102 104 103  CO2 BUN CREATININE 0.73 0.62 0.72  CALCIUM 8.3* 8.7 8.9  GLUCOSE 163* 155* 142*   Liver fxn  Recent Labs Lab 05/20/14 1343 05/21/14 0400  AST 28 73*  ALT 29 34  ALKPHOS 125* 108  BILITOT 0.6 1.1  PROT 6.7 6.6  ALBUMIN 3.9 3.6   coags  Recent Labs Lab 05/20/14 1343 05/21/14 0400  APTT 30  --   INR 0.91 0.95   Sepsis markers No results for input(s): LATICACIDVEN, PROCALCITON in the last 168 hours. Cardiac markers No results for input(s): CKTOTAL, CKMB,  TROPONINI in the last 168 hours. BNP No results for input(s): PROBNP in the last 168 hours. ABG  Recent Labs Lab 05/20/14 1356 05/20/14 2155  PHART  --  7.368  PCO2ART  --  41.7  PO2ART  --  74.9*  HCO3  --  23.7  TCO2 24 25.0   CBG trend  Recent Labs Lab 05/21/14 1548 05/21/14 2006 05/21/14 2318 05/22/14 0345 05/22/14 0751  GLUCAP 148* 132* 129* 136* 119*   CXR: bibasilar atx  ASSESSMENT / PLAN:  PULMONARY ASSESSMENT: Acute respiratory failure due to CVA, AMS PLAN:   Cont full vent support - settings reviewed and/or adjusted Cont vent bundle Daily SBT if/when meets criteria Titrate O2 for sat of 88-92%  CARDIOVASCULAR  ASSESSMENT:  Hypertensive emergency PLAN:  Change nicardipine to clevidipine gtt per Stroke team Amlodipine initiated 4/30 SBP goal < 140 mmHg  RENAL  ASSESSMENT:  Hypokalemia, resolved Hyperkalemia, resolved PLAN:   Monitor BMET intermittently Monitor I/Os Correct electrolytes as indicated  GASTROINTESTINAL  ASSESSMENT:  No acute issues PLAN:   SUP: enteral PPI Begin TFs 4/30  HEMATOLOGIC  ASSESSMENT:   Plavix, ASA prior to admission (indication: recent AAA stent)  Received plt transfusion in ED PLAN:  DVT px: SCDs Monitor CBC intermittently Transfuse per usual ICU guidelines  INFECTIOUS  ASSESSMENT: No evidence of infection PLAN:   Monitor temp, WBC count Micro and abx as above  ENDOCRINE  ASSESSMENT:   Hypothyroidism (L-thyroxine 75 mcg daily PTA) Hyperglycemia without prior DM history PLAN:   Cont L-thyroxine SSI  NEUROLOGIC  ASSESSMENT:   Hypertensive LCH S/P craniotomy 4/28 Post op pain ICU associated discomfort PLAN:   RASS goal: -2,-3 D/C propofol gtt Precedex and fentanyl drips. Daily WUA Post op mgmt per NS Further stroke eval per Stroke team  The patient is critically ill with multiple organ systems failure and requires high complexity decision making for assessment and support,  frequent evaluation and titration of therapies, application of advanced monitoring technologies and extensive interpretation of multiple databases.   Critical Care Time devoted to patient care services described in this note is  35  Minutes. This time reflects time of care of this signee Dr Koren Bound. This critical care time does not reflect procedure time, or teaching time or supervisory time of PA/NP/Med student/Med Resident etc but could involve care discussion time.  Alyson Reedy, M.D. [facility] Pulmonary/Critical Care Medicine. Pager: [phone]. After hours pager: [phone].

## 2014-05-22 NOTE — Progress Notes (Signed)
PT Cancellation Note  Patient Details Name: Carolyn [name] MRN: [id number] DOB: Oct 13, 1945   Cancelled Treatment:    Reason Eval/Treat Not Completed: Patient not medically ready.  Sedated on vent and when aroused not able to participate. 05/22/2014  Carolyn [name], PT [phone]  (pager)   Carolyn [name], Carolyn [name] 05/22/2014, 9:35 AM

## 2014-05-22 NOTE — Progress Notes (Signed)
STROKE TEAM PROGRESS NOTE   HISTORY Carolyn [name] is a 69 y.o. female brought to hospital as code stroke. EMS was called to her work and told she was noted to have right arm weakness then noted to complain of severe HA which then was followed by sudden unconsciousness. On arrival she was unconscious on NRB but not protectin g airway well. CT head was obtained and showed a large left parietal ICH. While in ED patient has one episode emesis and intubated for air way protection. NIH stroke score was 31. Blood pressure was markedly elevated, and at one point was 241/123. Emergency management of hypertension was undertaken with use of Cardene intravenously.  Date last known well: Date: 05/20/2014 Time last known well: Time: 12:40 tPA Given: No: ICH Modified Rankin:    SUBJECTIVE (INTERVAL HISTORY) Titrating down sedation. On clevedipine.   CTH no evidence of hydro.    OBJECTIVE Temp:  [97.7 F (36.5 C)-99.6 F (37.6 C)] 98.8 F (37.1 C) (04/30 0752) Pulse Rate:  [57-95] 64 (04/30 0803) Cardiac Rhythm:  [-] Normal sinus rhythm (04/30 0800) Resp:  [14-25] 18 (04/30 0803) BP: (103-157)/(28-63) 106/44 mmHg (04/30 0803) SpO2:  [99 %-100 %] 100 % (04/30 0803) Arterial Line BP: (72-181)/(43-94) 135/44 mmHg (04/30 0800) FiO2 (%):  [30 %-40 %] 30 % (04/30 0803) Weight:  [100.3 kg (221 lb 1.9 oz)] 100.3 kg (221 lb 1.9 oz) (04/30 0448)   Recent Labs Lab 05/21/14 1548 05/21/14 2006 05/21/14 2318 05/22/14 0345 05/22/14 0751  GLUCAP 148* 132* 129* 136* 119*    Recent Labs Lab 05/20/14 1343 05/20/14 1356 05/20/14 2201 05/21/14 0400 05/21/14 0703 05/22/14 0515  NA 139 140 141 136 138 139  K 3.4* 3.4* 3.9 5.9* 3.7 3.4*  CL 105 106 105 102 104 103  CO2 25  --  GLUCOSE 118* 125* 150* 163* 155* 142*  BUN CREATININE 0.62 0.60 0.73 0.73 0.62 0.72  CALCIUM 9.3  --  8.4 8.3* 8.7 8.9    Recent Labs Lab 05/20/14 1343 05/21/14 0400  AST 28 73*  ALT  29 34  ALKPHOS 125* 108  BILITOT 0.6 1.1  PROT 6.7 6.6  ALBUMIN 3.9 3.6    Recent Labs Lab 05/20/14 1343 05/20/14 1356 05/20/14 2201 05/21/14 0400 05/22/14 0515  WBC 9.7  --  17.0* 17.4* 15.5*  NEUTROABS 6.3  --   --   --   --   HGB 12.7 14.3 12.0 11.0* 10.0*  HCT 40.0 42.0 38.4 34.3* 32.0*  MCV 85.5  --  86.7 85.1 86.3  PLT 234  --  247 302 275   No results for input(s): CKTOTAL, CKMB, CKMBINDEX, TROPONINI in the last 168 hours.  Recent Labs  05/20/14 1343 05/21/14 0400  LABPROT 12.4 12.8  INR 0.91 0.95    Recent Labs  05/20/14 1513  COLORURINE YELLOW  LABSPEC 1.017  PHURINE 7.5  GLUCOSEU NEGATIVE  HGBUR NEGATIVE  BILIRUBINUR NEGATIVE  KETONESUR NEGATIVE  PROTEINUR 100*  UROBILINOGEN 0.2  NITRITE NEGATIVE  LEUKOCYTESUR NEGATIVE       Component Value Date/Time   CHOL 242* 05/21/2014 1048   TRIG 246* 05/21/2014 1048   HDL 61 05/21/2014 1048   CHOLHDL 4.0 05/21/2014 1048   VLDL 49* 05/21/2014 1048   LDLCALC 132* 05/21/2014 1048   Lab Results  Component Value Date   HGBA1C 5.9* 05/21/2014      Component Value Date/Time   LABOPIA NONE DETECTED  05/20/2014 1513   COCAINSCRNUR NONE DETECTED 05/20/2014 1513   LABBENZ NONE DETECTED 05/20/2014 1513   AMPHETMU NONE DETECTED 05/20/2014 1513   THCU NONE DETECTED 05/20/2014 1513   LABBARB NONE DETECTED 05/20/2014 1513     Recent Labs Lab 05/20/14 1343  ETH <5   I have personally reviewed the radiological images below and agree with the radiology interpretations.  Ct Head Wo Contrast  05/21/2014   IMPRESSION: 1. Interval decompression of the left-sided cerebral hemorrhage, with subdural drain in place along the craniotomy site. The hematoma is significantly reduced in volume. There is gas in the center of the hematoma and a small amount of gas and blood products in the extra-axial space on the left. 2. Intraventricular hemorrhage in both lateral ventricles and in the third and fourth ventricle, without  hydrocephalus. 3. Faintly increased density along the right temporal lobe is compatible with a small amount of new subarachnoid hemorrhage along the right temporal lobe. 4. 3 mm of left-to-right midline shift.     05/20/2014   IMPRESSION: Large left frontal parietal intracerebral hemorrhage with a 7.5 mm of midline shift. The hemorrhage has extended into the left lateral ventricle.    Dg Chest Port 1 View  05/21/2014    IMPRESSION: NG tube placed.  Stable bibasilar atelectasis.  No evidence of pulmonary edema.     05/20/2014   IMPRESSION: Endotracheal tube tip at the carina with the tip directed toward the right main bronchus. No edema or consolidation. No pneumothorax.    2D echo -  estimated ejection fraction was in the range of 65% to 70%   PHYSICAL EXAM  Temp:  [97.7 F (36.5 C)-99.6 F (37.6 C)] 98.8 F (37.1 C) (04/30 0752) Pulse Rate:  [57-95] 64 (04/30 0803) Resp:  [14-25] 18 (04/30 0803) BP: (103-157)/(28-63) 106/44 mmHg (04/30 0803) SpO2:  [99 %-100 %] 100 % (04/30 0803) Arterial Line BP: (72-181)/(43-94) 135/44 mmHg (04/30 0800) FiO2 (%):  [30 %-40 %] 30 % (04/30 0803) Weight:  [100.3 kg (221 lb 1.9 oz)] 100.3 kg (221 lb 1.9 oz) (04/30 0448)  General - Well nourished, well developed, intubated and agitated.  Ophthalmologic - fundi not visualized due to incorporation.  Cardiovascular - Regular rate and rhythm with no murmur.  Neuro - intubated and off sedation, pt became agitated and BP was high. Not open eyes on voice and not following commands. Left head dressing dry and JP drain patent. PERRL, pupil 3mm reactive to light, doll's eye present, corneal reflex and gag and cough reflex weakly positive. Breathing over the vent. Spontaneously moving LUE and LLE, but 0/5 RUE on pain and trace withdraw on RLE on pain stimulation, no babinski bilaterally. Reflex 1+.   ASSESSMENT/PLAN Carolyn [name] is a 69 y.o. female with history of hypertension presenting with right arm  weakness, severe headache, and subsequent unconsciousness.  She did not receive IV t-PA due to a large left frontal parietal intracerebral hemorrhage.  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   Resultant  Intubated and right hemiplegia  Repeat CT this am showed smaller hematoma and decreased midline shift  2D Echo pending  LDL pending  HgbA1c pending  SCDs for VTE prophylaxis, may consider heparin subq in the next a couple of days.    aspirin 81 mg orally every day and clopidogrel 75 mg orally every day prior to admission, now on no antithrombotic  Ongoing aggressive stroke risk factor management  Therapy recommendations: Pending  Disposition: Pending  Cerebral  edema  Midline shift improved s/p hematoma evacuation  Repeat CT no evidence of hydro   Will hold off hypertonic saline.   Neuro check  Hypertension  Home meds: Toprol-XL  Consider adding on anti hypertensives to titrate down clevedipine.   Unstable  Switch to cleviplex drip  Put on po BP meds in hope to wean off drip  BP goal < 140  Close monitoring  Hyperlipidemia  LDL pending, goal < 70  Other Stroke Risk Factors  Advanced age  Obesity, Body mass index is 37.94 kg/(m^2).    Other Active Problems  Intubated  Hyperkalemia  hyperglycemia  Mild anemia  Other Pertinent History  History of abdominal aortic aneurysm repair  Hospital day # 2  This patient is critically ill due to large ICH, respiratory failure, HTN not in control and at significant risk of neurological worsening, death form hematoma expansion, cerebral edema and brain herniation, seizure. This patient's care requires constant monitoring of vital signs, hemodynamics, respiratory and cardiac monitoring, review of multiple databases, neurological assessment, discussion with family, other specialists and medical decision making of high complexity. I spent 45 minutes of neurocritical care  time in the care of this patient.   Pauletta Browns  Stroke Neurology 05/22/2014 8:55 AM   To contact Stroke Continuity provider, please refer to [email or web address]. After hours, contact General Neurology

## 2014-05-22 NOTE — Progress Notes (Signed)
Nutrition Follow-up  DOCUMENTATION CODES:  Obesity unspecified  INTERVENTION:  Tube feeding: Initiate Vital High Protein @ 20 ml/hr via OGT and increase by 10 ml every 4 hours to goal rate of 55 ml/hr.     Tube feeding regimen provides 1320 kcal (100% of needs), 116 grams of protein, and 1109 ml of H2O.    NUTRITION DIAGNOSIS:  Inadequate oral intake related to inability to eat as evidenced by NPO status.  Ongoing  GOAL:  Provide needs based on ASPEN/SCCM guidelines  Not being met  MONITOR:  Vent status, Weight trends, Labs, I & O's, TF tolerance  REASON FOR ASSESSMENT:  Consult Enteral/tube feeding initiation and management  ASSESSMENT: 63 F with PMH of htn and recent stent of AAA performed @ [facility] presented to the ED 4/28 with L hemispheric hemorrhagic CVA. Intubated in ED due to depressed LOC and admitted to [facility] service with Stroke and NS consultants.   RD consulted to initiate and manage tube feedings. Pt remains intubated with OGT to suction. Propofol has been turned off. Labs reviewed.   Patient is currently intubated on ventilator support MV: 7.2 L/min Temp (24hrs), Avg:99 F (37.2 C), Min:97.7 F (36.5 C), Max:99.6 F (37.6 C)  Propofol: none  Height:  Ht Readings from Last 1 Encounters:  05/20/14 5' 4"  (1.626 m)    Weight:  Wt Readings from Last 1 Encounters:  05/22/14 221 lb 1.9 oz (100.3 kg)    Ideal Body Weight:     Wt Readings from Last 10 Encounters:  05/22/14 221 lb 1.9 oz (100.3 kg)    BMI:  Body mass index is 37.94 kg/(m^2).  Estimated Nutritional Needs:  Kcal:  1100-1400  Protein:  110-120 gm  Fluid:  per MD  Skin:  Reviewed, no issues  Diet Order:   NPO  EDUCATION NEEDS:  No education needs identified at this time   Intake/Output Summary (Last 24 hours) at 05/22/14 1418 Last data filed at 05/22/14 1300  Gross per 24 hour  Intake 1272.18 ml  Output   1295 ml  Net -22.82 ml    Last BM: PTA   Pryor Ochoa RD, LDN Inpatient Clinical Dietitian Pager: [phone] After Hours Pager: [phone]

## 2014-05-22 NOTE — Progress Notes (Signed)
Patient ID: [name] CourtGeraldine Wesch, female   DOB: 05/16/1945, 69 y.o.   MRN: [id number]  BP 114/47 mmHg  Pulse 57  Temp(Src) 98.8 F (37.1 C) (Oral)  Resp 19  Ht 5\' 4"  (1.626 m)  Wt 100.3 kg (221 lb 1.9 oz)  BMI 37.94 kg/m2  SpO2 98% Not following commands, Sedated, intubated Plegic on right side, localizing on the left. Perrl,  +corneals, +cough

## 2014-05-22 NOTE — Progress Notes (Signed)
eLink Physician-Brief Progress Note Patient Name: Carolyn CourtGeraldine [name] DOB: [birth year] MRN: [id number]   Date of Service  05/22/2014  HPI/Events of Note  Bradycardia. HR= 40's. Precedex IV infusion off since 1 PM.  eICU Interventions  Will try low dose Dopamine IV infusion. Watch for HTN.     Intervention Category Intermediate Interventions: Arrhythmia - evaluation and management  Sommer,Steven Eugene 05/22/2014, 5:54 PM

## 2014-05-23 ENCOUNTER — Inpatient Hospital Stay (HOSPITAL_COMMUNITY): Payer: BLUE CROSS/BLUE SHIELD

## 2014-05-23 ENCOUNTER — Encounter (HOSPITAL_COMMUNITY): Payer: Self-pay | Admitting: Nurse Practitioner

## 2014-05-23 DIAGNOSIS — I619 Nontraumatic intracerebral hemorrhage, unspecified: Secondary | ICD-10-CM | POA: Insufficient documentation

## 2014-05-23 DIAGNOSIS — J9690 Respiratory failure, unspecified, unspecified whether with hypoxia or hypercapnia: Secondary | ICD-10-CM | POA: Insufficient documentation

## 2014-05-23 DIAGNOSIS — I959 Hypotension, unspecified: Secondary | ICD-10-CM

## 2014-05-23 DIAGNOSIS — R001 Bradycardia, unspecified: Secondary | ICD-10-CM | POA: Insufficient documentation

## 2014-05-23 LAB — CBC
HCT: 31.7 % — ABNORMAL LOW (ref 36.0–46.0)
Hemoglobin: 9.6 g/dL — ABNORMAL LOW (ref 12.0–15.0)
MCH: 26.4 pg (ref 26.0–34.0)
MCHC: 30.3 g/dL (ref 30.0–36.0)
MCV: 87.3 fL (ref 78.0–100.0)
Platelets: 279 10*3/uL (ref 150–400)
RBC: 3.63 MIL/uL — ABNORMAL LOW (ref 3.87–5.11)
RDW: 16.3 % — AB (ref 11.5–15.5)
WBC: 13.5 10*3/uL — AB (ref 4.0–10.5)

## 2014-05-23 LAB — BLOOD GAS, ARTERIAL
Acid-Base Excess: 3.1 mmol/L — ABNORMAL HIGH (ref 0.0–2.0)
Bicarbonate: 27.6 mEq/L — ABNORMAL HIGH (ref 20.0–24.0)
Drawn by: 100061
FIO2: 0.3 %
MECHVT: 440 mL
O2 Saturation: 96.4 %
PEEP: 5 cmH2O
PH ART: 7.397 (ref 7.350–7.450)
PO2 ART: 85.3 mmHg (ref 80.0–100.0)
Patient temperature: 98.6
RATE: 16 resp/min
TCO2: 29 mmol/L (ref 0–100)
pCO2 arterial: 45.8 mmHg — ABNORMAL HIGH (ref 35.0–45.0)

## 2014-05-23 LAB — GLUCOSE, CAPILLARY
GLUCOSE-CAPILLARY: 143 mg/dL — AB (ref 70–99)
GLUCOSE-CAPILLARY: 145 mg/dL — AB (ref 70–99)
GLUCOSE-CAPILLARY: 130 mg/dL — AB (ref 70–99)
Glucose-Capillary: 134 mg/dL — ABNORMAL HIGH (ref 70–99)
Glucose-Capillary: 136 mg/dL — ABNORMAL HIGH (ref 70–99)
Glucose-Capillary: 110 mg/dL — ABNORMAL HIGH (ref 70–99)

## 2014-05-23 LAB — BASIC METABOLIC PANEL
ANION GAP: 7 (ref 5–15)
BUN: 19 mg/dL (ref 6–20)
CHLORIDE: 109 mmol/L (ref 101–111)
CO2: 27 mmol/L (ref 22–32)
Calcium: 8.8 mg/dL — ABNORMAL LOW (ref 8.9–10.3)
Creatinine, Ser: 0.56 mg/dL (ref 0.44–1.00)
GFR calc non Af Amer: >60 mL/min (ref >60)
Glucose, Bld: 150 mg/dL — ABNORMAL HIGH (ref 70–99)
Potassium: 4 mmol/L (ref 3.5–5.1)
Sodium: 143 mmol/L (ref 135–145)

## 2014-05-23 LAB — PHOSPHORUS: Phosphorus: 2.7 mg/dL (ref 2.5–4.6)

## 2014-05-23 LAB — MAGNESIUM: Magnesium: 2.1 mg/dL (ref 1.7–2.4)

## 2014-05-23 MED ORDER — PNEUMOCOCCAL VAC POLYVALENT 25 MCG/0.5ML IJ INJ
0.5000 mL | INJECTION | INTRAMUSCULAR | Status: AC
  Administered 2014-05-24: 0.5 mL via INTRAMUSCULAR
  Filled 2014-05-23: qty 0.5

## 2014-05-23 MED ORDER — ATROPINE SULFATE 0.1 MG/ML IJ SOLN
INTRAMUSCULAR | Status: AC
  Filled 2014-05-23: qty 10

## 2014-05-23 NOTE — Progress Notes (Signed)
PULMONARY  / CRITICAL CARE MEDICINE  Name: Carolyn [name] MRN: [id number] DOB: January 30, 1945    LOS: 3  REFERRING MD :  Dr. Roseanne Reno  CHIEF COMPLAINT:  ICH  BRIEF PATIENT DESCRIPTION:  28 F with PMH of htn and recent stent of AAA performed @ [facility] presented to the ED 4/28 with L hemispheric hemorrhagic CVA. Intubated in ED due to depressed LOC and admitted to [facility] service with Stroke and NS consultants. 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    MAJOR EVENTS/TEST RESULTS: 4/28 admitted as above to [facility] service. Stroke team consultation. NS consultation 4/28 CT head: Large left frontal parietal intracerebral hemorrhage with a 7.5 mm of midline shift. The hemorrhage has extended into the left lateral ventricle 4/28 Procedure: Left frontoparietal craniotomy for evacuation of intracerebral hematoma. Surgeon: Gerlene Fee 4/29 CT head: Interval decompression of the left-sided cerebral hemorrhage, with subdural drain in place along the craniotomy site. The hematoma is significantly reduced in volume. There is gas in the center of the hematoma and a small amount of gas and blood products in the extra-axial space on the left. Intraventricular hemorrhage in both lateral ventricles and in the third and fourth ventricle, without hydrocephalus. Faintly increased density along the right temporal lobe is compatible with a small amount of new subarachnoid hemorrhage along the right temporal lobe. 3 mm of left-to-right midline shift 4/29 Remains intubated, sedated, on nicardipine, propofol 4/29 TTE:  4/30 CT head: "improved"  INDWELLING DEVICES:: ETT 4/28 >>  R radial A-line 4/28 >>  PICC (ordered) 4/29 >>   MICRO DATA: MRSA PCR 4/28 >> POS  ANTIMICROBIALS:   None  INTERVAL HISTORY:  Moving left, not purposeful on right.  VITAL SIGNS: Temp:  [97.9 F (36.6 C)-98.9 F (37.2 C)] 98.7 F (37.1 C) (05/01 1203) Pulse Rate:  [30-136] 54 (05/01 1300) Resp:  [10-22] 15 (05/01  1300) BP: (95-173)/(30-90) 127/54 mmHg (05/01 1300) SpO2:  [96 %-100 %] 98 % (05/01 1300) Arterial Line BP: (94-194)/(43-119) 164/58 mmHg (05/01 1300) FiO2 (%):  [30 %] 30 % (05/01 1300) Weight:  [99.8 kg (220 lb 0.3 oz)] 99.8 kg (220 lb 0.3 oz) (05/01 0500) HEMODYNAMICS:   VENTILATOR SETTINGS: Vent Mode:  [-] PRVC FiO2 (%):  [30 %] 30 % Set Rate:  [16 bmp] 16 bmp Vt Set:  [440 mL] 440 mL PEEP:  [5 cmH20] 5 cmH20 Plateau Pressure:  [14 cmH20-17 cmH20] 17 cmH20   INTAKE / OUTPUT: Intake/Output      04/30 0701 - 05/01 0700 05/01 0701 - 05/02 0700   I.V. (mL/kg) 531.3 (5.3) 135.4 (1.4)   NG/GT 572.1 385   IV Piggyback 320 105   Total Intake(mL/kg) 1423.4 (14.3) 625.4 (6.3)   Urine (mL/kg/hr) 1875 (0.8)    Emesis/NG output 150 (0.1)    Drains 16 (0) 600 (0.9)   Total Output 2041 600   Net -617.6 +25.4         PHYSICAL EXAMINATION: General:  RASS -2, not F/C Neuro: R hemiplegia improving, left side normal. HEENT: Surgical dressing in place Cardiovascular:  Reg, no M Lungs: clear anteriorly Abdomen:  Obese, soft, +BS Ext: warm, no edema  LABS: Cbc  Recent Labs Lab 05/21/14 0400 05/22/14 0515 05/23/14 0350  WBC 17.4* 15.5* 13.5*  HGB 11.0* 10.0* 9.6*  HCT 34.3* 32.0* 31.7*  PLT 302 275 279   Chemistry  Recent Labs Lab 05/21/14 0703 05/22/14 0515 05/23/14 0350  NA 138 139 143  K 3.7 3.4* 4.0  CL 104 103 109  CO2 24 28 27   BUN 7 12 19   CREATININE 0.62 0.72 0.56  CALCIUM 8.7 8.9 8.8*  MG  --   --  2.1  PHOS  --   --  2.7  GLUCOSE 155* 142* 150*   Liver fxn  Recent Labs Lab 05/20/14 1343 05/21/14 0400  AST 28 73*  ALT 29 34  ALKPHOS 125* 108  BILITOT 0.6 1.1  PROT 6.7 6.6  ALBUMIN 3.9 3.6   coags  Recent Labs Lab 05/20/14 1343 05/21/14 0400  APTT 30  --   INR 0.91 0.95   Sepsis markers No results for input(s): LATICACIDVEN, PROCALCITON in the last 168 hours. Cardiac markers No results for input(s): CKTOTAL, CKMB, TROPONINI in the  last 168 hours. BNP No results for input(s): PROBNP in the last 168 hours. ABG  Recent Labs Lab 05/20/14 1356 05/20/14 2155 05/23/14 0408  PHART  --  7.368 7.397  PCO2ART  --  41.7 45.8*  PO2ART  --  74.9* 85.3  HCO3  --  23.7 27.6*  TCO2 24 25.0 29.0   CBG trend  Recent Labs Lab 05/22/14 1925 05/22/14 2328 05/23/14 0319 05/23/14 0725 05/23/14 1206  GLUCAP 118* 128* 134* 143* 145*   CXR: bibasilar atx  ASSESSMENT / PLAN:  PULMONARY ASSESSMENT: Acute respiratory failure due to CVA, AMS PLAN:   May begin PS trials but no extubation given mental status. Cont vent bundle Titrate O2 for sat of 88-92%  CARDIOVASCULAR  ASSESSMENT:  Hypertensive emergency Hypotensive and in junctional brady over night PLAN:  Now on dopamine to maintain BP Cardiology consult called. Hold all anti-HTN. SBP goal < 140 mmHg  RENAL  ASSESSMENT:  Hypokalemia, resolved Hyperkalemia, resolved PLAN:   Monitor BMET intermittently Monitor I/Os Correct electrolytes as indicated  GASTROINTESTINAL  ASSESSMENT:  No acute issues PLAN:   SUP: enteral PPI Began TFs 4/30  HEMATOLOGIC  ASSESSMENT:   Plavix, ASA prior to admission (indication: recent AAA stent)  Received plt transfusion in ED PLAN:  DVT px: SCDs Monitor CBC intermittently Transfuse per usual ICU guidelines  INFECTIOUS  ASSESSMENT: No evidence of infection PLAN:   Monitor temp, WBC count Micro and abx as above  ENDOCRINE  ASSESSMENT:   Hypothyroidism (L-thyroxine 75 mcg daily PTA) Hyperglycemia without prior DM history PLAN:   Cont L-thyroxine SSI  NEUROLOGIC  ASSESSMENT:   Hypertensive LCH S/P craniotomy 4/28 Post op pain ICU associated discomfort PLAN:   RASS goal: -2,-3 Fentanyl drip only. Precedex caused significant bradycardia and hypotension. Daily WUA Post op mgmt per NS Further stroke eval per Stroke team  The patient is critically ill with multiple organ systems failure and  requires high complexity decision making for assessment and support, frequent evaluation and titration of therapies, application of advanced monitoring technologies and extensive interpretation of multiple databases.   Critical Care Time devoted to patient care services described in this note is  35  Minutes. This time reflects time of care of this signee Dr Koren BoundWesam [name]. This critical care time does not reflect procedure time, or teaching time or supervisory time of PA/NP/Med student/Med Resident etc but could involve care discussion time.  Carolyn ReedyWesam G. [name], M.D. [facility] Pulmonary/Critical Care Medicine. Pager: [phone]. After hours pager: [phone].

## 2014-05-23 NOTE — Progress Notes (Signed)
OT Cancellation Note  Patient Details Name: Carolyn CourtGeraldine Azua MRN: [id number] DOB: 18-Feb-1945   Cancelled Treatment:    Reason Eval/Treat Not Completed: Patient not medically ready. Pt intubated and sedated. Will follow.  Evern BioMayberry, Anneliese Leblond Lynn 05/23/2014, 10:18 AM

## 2014-05-23 NOTE — Progress Notes (Signed)
STROKE TEAM PROGRESS NOTE   HISTORY Carolyn [name] is a 69 y.o. female brought to hospital as code stroke. EMS was called to her work and told she was noted to have right arm weakness then noted to complain of severe HA which then was followed by sudden unconsciousness. On arrival she was unconscious on NRB but not protectin g airway well. CT head was obtained and showed a large left parietal ICH. While in ED patient has one episode emesis and intubated for air way protection. NIH stroke score was 31. Blood pressure was markedly elevated, and at one point was 241/123. Emergency management of hypertension was undertaken with use of Cardene intravenously.  Date last known well: Date: 05/20/2014 Time last known well: Time: 12:40 tPA Given: No: ICH Modified Rankin:    SUBJECTIVE (INTERVAL HISTORY) Titrating down sedation. On clevedipine.   CTH no evidence of hydro.  Overnight bradycardia and pauses. Off propofol.    OBJECTIVE Temp:  [97.9 F (36.6 C)-99.5 F (37.5 C)] 98.7 F (37.1 C) (05/01 0725) Pulse Rate:  [30-136] 57 (05/01 0751) Cardiac Rhythm:  [-] Junctional rhythm (05/01 0000) Resp:  [10-32] 16 (05/01 0751) BP: (95-173)/(30-57) 104/40 mmHg (05/01 0751) SpO2:  [96 %-100 %] 98 % (05/01 0751) Arterial Line BP: (94-194)/(42-119) 139/55 mmHg (05/01 0700) FiO2 (%):  [30 %] 30 % (05/01 0751) Weight:  [99.8 kg (220 lb 0.3 oz)] 99.8 kg (220 lb 0.3 oz) (05/01 0500)   Recent Labs Lab 05/22/14 1628 05/22/14 1925 05/22/14 2328 05/23/14 0319 05/23/14 0725  GLUCAP 124* 118* 128* 134* 143*    Recent Labs Lab 05/20/14 2201 05/21/14 0400 05/21/14 0703 05/22/14 0515 05/23/14 0350  NA 141 136 138 139 143  K 3.9 5.9* 3.7 3.4* 4.0  CL 105 102 104 103 109  CO2 23 23 24 28 27   GLUCOSE 150* 163* 155* 142* 150*  BUN 10 10 7 12 19   CREATININE 0.73 0.73 0.62 0.72 0.56  CALCIUM 8.4 8.3* 8.7 8.9 8.8*  MG  --   --   --   --  2.1  PHOS  --   --   --   --  2.7    Recent  Labs Lab 05/20/14 1343 05/21/14 0400  AST 28 73*  ALT 29 34  ALKPHOS 125* 108  BILITOT 0.6 1.1  PROT 6.7 6.6  ALBUMIN 3.9 3.6    Recent Labs Lab 05/20/14 1343 05/20/14 1356 05/20/14 2201 05/21/14 0400 05/22/14 0515 05/23/14 0350  WBC 9.7  --  17.0* 17.4* 15.5* 13.5*  NEUTROABS 6.3  --   --   --   --   --   HGB 12.7 14.3 12.0 11.0* 10.0* 9.6*  HCT 40.0 42.0 38.4 34.3* 32.0* 31.7*  MCV 85.5  --  86.7 85.1 86.3 87.3  PLT 234  --  247 302 275 279   No results for input(s): CKTOTAL, CKMB, CKMBINDEX, TROPONINI in the last 168 hours.  Recent Labs  05/20/14 1343 05/21/14 0400  LABPROT 12.4 12.8  INR 0.91 0.95    Recent Labs  05/20/14 1513  COLORURINE YELLOW  LABSPEC 1.017  PHURINE 7.5  GLUCOSEU NEGATIVE  HGBUR NEGATIVE  BILIRUBINUR NEGATIVE  KETONESUR NEGATIVE  PROTEINUR 100*  UROBILINOGEN 0.2  NITRITE NEGATIVE  LEUKOCYTESUR NEGATIVE       Component Value Date/Time   CHOL 242* 05/21/2014 1048   TRIG 246* 05/21/2014 1048   HDL 61 05/21/2014 1048   CHOLHDL 4.0 05/21/2014 1048   VLDL 49* 05/21/2014 1048  LDLCALC 132* 05/21/2014 1048   Lab Results  Component Value Date   HGBA1C 5.9* 05/21/2014      Component Value Date/Time   LABOPIA NONE DETECTED 05/20/2014 1513   COCAINSCRNUR NONE DETECTED 05/20/2014 1513   LABBENZ NONE DETECTED 05/20/2014 1513   AMPHETMU NONE DETECTED 05/20/2014 1513   THCU NONE DETECTED 05/20/2014 1513   LABBARB NONE DETECTED 05/20/2014 1513     Recent Labs Lab 05/20/14 1343  ETH <5   I have personally reviewed the radiological images below and agree with the radiology interpretations.  Ct Head Wo Contrast  05/21/2014   IMPRESSION: 1. Interval decompression of the left-sided cerebral hemorrhage, with subdural drain in place along the craniotomy site. The hematoma is significantly reduced in volume. There is gas in the center of the hematoma and a small amount of gas and blood products in the extra-axial space on the left. 2.  Intraventricular hemorrhage in both lateral ventricles and in the third and fourth ventricle, without hydrocephalus. 3. Faintly increased density along the right temporal lobe is compatible with a small amount of new subarachnoid hemorrhage along the right temporal lobe. 4. 3 mm of left-to-right midline shift.     05/20/2014   IMPRESSION: Large left frontal parietal intracerebral hemorrhage with a 7.5 mm of midline shift. The hemorrhage has extended into the left lateral ventricle.    Dg Chest Port 1 View  05/21/2014    IMPRESSION: NG tube placed.  Stable bibasilar atelectasis.  No evidence of pulmonary edema.     05/20/2014   IMPRESSION: Endotracheal tube tip at the carina with the tip directed toward the right main bronchus. No edema or consolidation. No pneumothorax.    2D echo -  estimated ejection fraction was in the range of 65% to 70%   PHYSICAL EXAM  Temp:  [97.9 F (36.6 C)-99.5 F (37.5 C)] 98.7 F (37.1 C) (05/01 0725) Pulse Rate:  [30-136] 57 (05/01 0751) Resp:  [10-32] 16 (05/01 0751) BP: (95-173)/(30-57) 104/40 mmHg (05/01 0751) SpO2:  [96 %-100 %] 98 % (05/01 0751) Arterial Line BP: (94-194)/(42-119) 139/55 mmHg (05/01 0700) FiO2 (%):  [30 %] 30 % (05/01 0751) Weight:  [99.8 kg (220 lb 0.3 oz)] 99.8 kg (220 lb 0.3 oz) (05/01 0500)  General - Well nourished, well developed, intubated and agitated.  Ophthalmologic - fundi not visualized due to incorporation.  Cardiovascular - Regular rate and rhythm with no murmur.  Neuro - intubated and off sedation, pt became agitated and BP was high. Not open eyes on voice and not following commands. Left head dressing dry and JP drain patent. PERRL, pupil 3mm reactive to light, doll's eye present, corneal reflex and gag and cough reflex weakly positive. Breathing over the vent. Spontaneously moving LUE and LLE, but 0/5 RUE on pain and trace withdraw on RLE on pain stimulation, no babinski bilaterally. Reflex 1+.    ASSESSMENT/PLAN [name] is a 69 y.o. female with history of hypertension presenting with right arm weakness, severe headache, and subsequent unconsciousness.  She did not receive IV t-PA due to a large left frontal parietal intracerebral hemorrhage.  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   Resultant  Intubated and right hemiplegia  Repeat CT this am showed smaller hematoma and decreased midline shift  SCDs for VTE prophylaxis,   Today is day 4, Would start Heparin sq tomorrow based on AHA guidelines.     aspirin 81 mg orally every day and clopidogrel 75 mg orally every  day prior to admission, now on no antithrombotic  Ongoing aggressive stroke risk factor management  Therapy recommendations: Pending  Disposition: Pending  Cerebral edema  Midline shift improved s/p hematoma evacuation  Repeat CT no evidence of hydro   Will hold off hypertonic saline.   Neuro check  Hypertension  Home meds: Toprol-XL  Consider adding on anti hypertensives to titrate down clevedipine.   Unstable  Switch to cleviplex drip  Put on po BP meds in hope to wean off drip  BP goal < 140  Close monitoring  On fentanyl.  Bradycardia and pauses at night. On dopamine gtt. Cardiology eval.    Hyperlipidemia  LDL pending, goal < 70  Other Stroke Risk Factors  Advanced age  Obesity, Body mass index is 37.75 kg/(m^2).    Other Active Problems  Intubated  Hyperkalemia  hyperglycemia  Mild anemia  Other Pertinent History  History of abdominal aortic aneurysm repair  Hospital day # 3  This patient is critically ill due to large ICH, respiratory failure, HTN not in control and at significant risk of neurological worsening, death form hematoma expansion, cerebral edema and brain herniation, seizure. This patient's care requires constant monitoring of vital signs, hemodynamics, respiratory and cardiac monitoring, review of  multiple databases, neurological assessment, discussion with family, other specialists and medical decision making of high complexity. I spent 40 minutes of neurocritical care time in the care of this patient.   Pauletta Browns  Stroke Neurology 05/23/2014 9:15 AM   To contact Stroke Continuity provider, please refer to [email or web address]. After hours, contact General Neurology

## 2014-05-23 NOTE — Progress Notes (Signed)
Transported pt to mri and back on the vent.  No complications during transport

## 2014-05-23 NOTE — Progress Notes (Signed)
PT Cancellation Note  Patient Details Name: [name] CourtGeraldine Mandato MRN: [id number] DOB: 09/02/1945   Cancelled Treatment:    Reason Eval/Treat Not Completed: Patient not medically ready.  Patient remains intubated.  Noted agitation and bradycardia per chart.  Will return at later time for PT evaluation when appropriate for patient.   Vena AustriaDavis, Billi Bright H 05/23/2014, 10:13 AM Durenda HurtSusan H. Renaldo Fiddleravis, PT, [facility] Pager [phone]

## 2014-05-23 NOTE — Consult Note (Signed)
CARDIOLOGY CONSULT NOTE   Patient ID: Carolyn [name] MRN: [id number], DOB/AGE: 69-30-1947   Admit date: 05/20/2014 Date of Consult: 05/23/2014   Primary Physician: No primary care provider on file. Primary Cardiologist: new  Pt. Profile  69 y/o female with a h/o HTN and AAA s/p recent stent grafting who presented to [facility] with acute L hemispheric hemorrhagic cva, whom we've been asked to evaluate 2/2 bradycardia and jxnl rhythm.  Problem List  Past Medical History  Diagnosis Date  . Hypertension   . AAA (abdominal aortic aneurysm)     a. s/p stent grafting @ [facility].  . Hemorrhagic cerebrovascular accident (CVA)     a. 05/20/2014 L hemispheric hemorrhagic CVA s/p decompressive craniotomy and evacuation.  . H/O echocardiogram     a. 04/2014 Echo: EF 65-70%, Gr 1 DD, mild LVH.    Past Surgical History  Procedure Laterality Date  . Abdominal aortic aneurysm repair    . Craniotomy Left 05/20/2014    Procedure: CRANIOTOMY HEMATOMA EVACUATION;  Surgeon: Aliene Beams, MD;  Location: [facility];  Service: Neurosurgery;  Laterality: Left;    Allergies  Allergies  Allergen Reactions  . Statins Other (See Comments)    reductis inhibitors  . Adhesive [Tape] Rash  . Latex Rash  . Other Rash    Medical tape and bandaids    HPI   69 y/o female with the above problem list.  Per H & P, she is recently s/p AAA stent grafting @ [facility].  She was discharged on ASA and plavix and was in her USOH until 4/28 when she developed a severe headache followed by right sided weakness and subsequent loss of consciousness.    She was taken to the [facility] ED where a Code Stroke was called.  Stat Head CT showed a large left frontal parietal IC hemorrhage with 7.5 mm midline shift and hemorrhage into her left lateral ventricle.  She was intubated and taken for emergent decompressive craniotomy and evacuation.  Post-op, she has remained intubated and was initially hypertensive requiring nicardipine  infusion.  On 4/30, she she developed hypotension and bradycardia.  Precedex was d/c'd and she was placed on dopamine.  She has since been having intermittent ectopic atrial rhythm, junctional bradycardia, and pauses up to a max of 3.31 seconds.  She remains intubated and sedated.  Though she was on bb @ home, she has not received any bb here.  Inpatient Medications  . antiseptic oral rinse  7 mL Mouth Rinse QID  . chlorhexidine  15 mL Mouth Rinse BID  . Chlorhexidine Gluconate Cloth  6 each Topical Q0600  . feeding supplement (VITAL HIGH PROTEIN)  1,000 mL Per Tube Q24H  . insulin aspart  0-15 Units Subcutaneous 6 times per day  . levETIRAcetam  500 mg Intravenous Q12H  . levothyroxine  75 mcg Oral QAC breakfast  . mupirocin ointment  1 application Nasal BID  . pantoprazole sodium  40 mg Per Tube Q1200  . senna-docusate  1 tablet Per Tube BID  . sodium chloride  10-40 mL Intracatheter Q12H    Family History Unable to obtain - intub/sedated.   Social History - Unable to obtain - intub/sedated.  History   Social History  . Marital Status: Unknown    Spouse Name: N/A  . Number of Children: N/A  . Years of Education: N/A   Occupational History  . Not on file.   Social History Main Topics  . Smoking status: Not on file  .  Smokeless tobacco: Not on file  . Alcohol Use: Not on file  . Drug Use: Not on file  . Sexual Activity: Not on file   Other Topics Concern  . Not on file   Social History Narrative     Review of Systems  Unable to obtain - intub/sedated.   Physical Exam  Blood pressure 120/40, pulse 62, temperature 98.7 F (37.1 C), temperature source Axillary, resp. rate 14, height  (1.626 m), weight 220 lb 0.3 oz (99.8 kg), SpO2 98 %.  General: intubated, sedated. Psych: intubated, sedated. Neuro: intubated, sedated. HEENT: left scalp surgical site clean/dry. Neck: Supple without bruits or JVD. Lungs:  Resp regular and unlabored, CTA. Heart: RRR no s3, s4,  or murmurs. Abdomen: Soft, non-tender, non-distended, BS + x 4.  Extremities: No clubbing, cyanosis or edema. DP/PT/Radials 2+ and equal bilaterally.  Labs   Lab Results  Component Value Date   WBC 13.5* 05/23/2014   HGB 9.6* 05/23/2014   HCT 31.7* 05/23/2014   MCV 87.3 05/23/2014   PLT 279 05/23/2014    Recent Labs Lab 05/21/14 0400  05/23/14 0350  NA 136  < > 143  K 5.9*  < > 4.0  CL 102  < > 109  CO2 23  < > 27  BUN 10  < > 19  CREATININE 0.73  < > 0.56  CALCIUM 8.3*  < > 8.8*  PROT 6.6  --   --   BILITOT 1.1  --   --   ALKPHOS 108  --   --   ALT 34  --   --   AST 73*  --   --   GLUCOSE 163*  < > 150*  < > = values in this interval not displayed. Lab Results  Component Value Date   CHOL 242* 05/21/2014   HDL 61 05/21/2014   LDLCALC 132* 05/21/2014   TRIG 246* 05/21/2014   No results found for: DDIMER  Radiology/Studies  Ct Head Wo Contrast  05/22/2014   CLINICAL DATA:  Follow-up intracranial hemorrhage.  EXAM: CT HEAD WITHOUT CONTRAST  TECHNIQUE: Contiguous axial images were obtained from the base of the skull through the vertex without intravenous contrast.  COMPARISON:  CT of the head May 21, 2014  FINDINGS: Status post LEFT frontoparietal craniotomy for partial evacuation of LEFT frontoparietal intraparenchymal hematoma, residual hematoma is grossly unchanged, with 6.3 x 3.4 cm with central gas/Surgicel. Intraventricular extension again noted, no obstructive hydrocephalus. LEFT frontoparietal surrounding vasogenic edema, local sulcal effacement resulting in 3 mm LEFT-to-RIGHT midline shift, unchanged. Faint subarachnoid blood in RIGHT temporal sulci. No acute large vascular territory infarct.  LEFT frontoparietal extra-axial drain. Basal cisterns are patent. Ocular globes and orbital contents are normal. Included paranasal sinuses and mastoid air cells are well aerated. Moderate RIGHT temporomandibular osteoarthrosis.  IMPRESSION: Similar partially evacuated LEFT  frontoparietal hematoma, similar mass effect with 3 mm LEFT-to-RIGHT midline shift. Status post LEFT frontal parietal craniotomy with extra-axial surgical drain in place. Faint RIGHT subarachnoid blood, unchanged.  Intraventricular blood products, no hydrocephalus.   Electronically Signed   By: Awilda Metro   On: 05/22/2014 06:10   Ct Head Wo Contrast  05/21/2014   CLINICAL DATA:  Intracranial hemorrhage  EXAM: CT HEAD WITHOUT CONTRAST  TECHNIQUE: Contiguous axial images were obtained from the base of the skull through the vertex without intravenous contrast.  COMPARISON:  05/20/2014  FINDINGS: Interval left craniotomy with decompression/evacuation of the left cerebral hemorrhage. Reduced size of the overall  hemorrhage from prior measurement of 7.0 by 4.4 cm to current size of 6.4 by 3.7 cm. There is gas in the hemorrhage bed and along the adjacent extra-axial space below the craniotomy site. A small subdural drain is in place.  Intraventricular hemorrhage is present in both lateral ventricles and is not visible in the third and fourth ventricles. No hydrocephalus. Small amount of extra-axial pneumocephalus along the left frontal lobe.  Trace amount of subarachnoid hemorrhage along the right temporal lobe appears new.  No new stroke is identified. Mild effacement of sulci on the left. 3 mm left-to-right midline shift.  IMPRESSION: 1. Interval decompression of the left-sided cerebral hemorrhage, with subdural drain in place along the craniotomy site. The hematoma is significantly reduced in volume. There is gas in the center of the hematoma and a small amount of gas and blood products in the extra-axial space on the left. 2. Intraventricular hemorrhage in both lateral ventricles and in the third and fourth ventricle, without hydrocephalus. 3. Faintly increased density along the right temporal lobe is compatible with a small amount of new subarachnoid hemorrhage along the right temporal lobe. 4. 3 mm of  left-to-right midline shift.   Electronically Signed   By: Gaylyn Rong M.D.   On: 05/21/2014 07:44   Ct Head Wo Contrast  05/20/2014   CLINICAL DATA:  Severe headache today. Right-sided weakness. Code stroke.  EXAM: CT HEAD WITHOUT CONTRAST  TECHNIQUE: Contiguous axial images were obtained from the base of the skull through the vertex without intravenous contrast.  COMPARISON:  None.  FINDINGS: There is a large acute intracerebral hemorrhage in the left frontoparietal region measuring 7.0 x 3.8 cm. There is 7.5 mm of midline shift from left-to-right. There is blood in the left lateral ventricle.  No other abnormalities of the brain parenchyma.  Osseous structures are normal.  IMPRESSION: Large left frontal parietal intracerebral hemorrhage with a 7.5 mm of midline shift. The hemorrhage has extended into the left lateral ventricle. Critical Value/emergent results were called by telephone at the time of interpretation on 05/20/2014 at 2:00 pm to Dr. Roseanne Reno, who verbally acknowledged these results.   Electronically Signed   By: Francene Boyers M.D.   On: 05/20/2014 14:02   Dg Chest Port 1 View  05/23/2014   CLINICAL DATA:  Nontraumatic intracranial hemorrhage. Hypertensive emergency. Acute respiratory failure. On ventilator.  EXAM: PORTABLE CHEST - 1 VIEW  COMPARISON:  05/22/2014  FINDINGS: Endotracheal tube and nasogastric tube remain in appropriate position. Low lung volumes are again demonstrated. Heart size is stable. Decreased interstitial infiltrates seen, consistent with decreased interstitial edema. Decreased atelectasis also noted in the lateral left lung base since prior study. No new or worsening areas of pulmonary opacity seen. No evidence of pneumothorax.  IMPRESSION: Decreased interstitial edema pattern and decreased atelectasis in lateral left lung base. No acute findings identified.   Electronically Signed   By: Myles Rosenthal M.D.   On: 05/23/2014 09:11   Dg Chest Port 1 View  05/22/2014    CLINICAL DATA:  Respiratory failure.  History of hypertension.  EXAM: PORTABLE CHEST - 1 VIEW  COMPARISON:  05/21/2014; 05/20/2014  FINDINGS: Grossly unchanged cardiac silhouette and mediastinal contours with atherosclerotic plaque within the thoracic aorta. Interval placement of left upper extremity approach PICC line with tip projected over the superior cavoatrial junction. Stable positioning of support apparatus. No pneumothorax. Pulmonary vasculature is indistinct with cephalization of flow. Worsening bibasilar heterogeneous opacities, left greater than right. Trace pleural effusions are not  excluded. Unchanged bones.  IMPRESSION: 1. Interval placement of left upper extremity approach PICC line with tip projected over the superior cavoatrial junction. 2. Otherwise, stable position of support apparatus. No pneumothorax. 3. Interval development of mild pulmonary edema with worsening bibasilar opacities, left greater than right, atelectasis versus infiltrate.   Electronically Signed   By: Simonne ComeJohn  Watts M.D.   On: 05/22/2014 08:29   Dg Chest Port 1 View  05/21/2014   CLINICAL DATA:  Respiratory failure  EXAM: PORTABLE CHEST - 1 VIEW  COMPARISON:  Yesterday  FINDINGS: NG tube placed beyond the gastroesophageal junction. Stable endotracheal tube. Low volumes. Bibasilar atelectasis. Cardiomegaly no pneumothorax. Normal vascularity. Central vascular crowding.  IMPRESSION: NG tube placed.  Stable bibasilar atelectasis.  No evidence of pulmonary edema.   Electronically Signed   By: Jolaine ClickArthur  Hoss M.D.   On: 05/21/2014 07:45   Dg Chest Portable 1 View  05/20/2014   CLINICAL DATA:  Hypoxia  EXAM: PORTABLE CHEST - 1 VIEW  COMPARISON:  None.  FINDINGS: Endotracheal tube tip is at the carina with the tip directed toward the right main bronchus. No pneumothorax. No edema or consolidation. Heart is upper normal in size with pulmonary vascularity within normal limits. No adenopathy.  IMPRESSION: Endotracheal tube tip at the  carina with the tip directed toward the right main bronchus. No edema or consolidation. No pneumothorax.  Critical Value/emergent results were called by telephone at the time of interpretation on 05/20/2014 at 3:02 pm to Dr. Tilden FossaELIZABETH REES , who verbally acknowledged these results.   Electronically Signed   By: Bretta BangWilliam  Woodruff III M.D.   On: 05/20/2014 15:02   2D Echocardiogram 4.29.2016  Study Conclusions  - Left ventricle: The cavity size was normal. Wall thickness was   increased in a pattern of mild LVH. Systolic function was   vigorous. The estimated ejection fraction was in the range of 65%   to 70%. Doppler parameters are consistent with abnormal left   ventricular relaxation (grade 1 diastolic dysfunction). _____________   ECG  Ectopic atrial bradycardia, 58, no acute st/t changees.  ASSESSMENT AND PLAN  1.  Bradycardia:  Pt with intermittent ectopic atrial rhythm/bradycardia and intermittent junctional beats with pauses ranging from 2 to 3.31 seconds max.  She has not received any AVN blocking agents.  She is currently on low dose dopamine (2.5 mcg) with stable HR and BP, though she continues to have ectopic rhythms.  Echo showed nl LV fxn.  Cont dopamine for the time being.  Will ask EP to eval in the AM.  No role for pacing @ this time.   2.  Hypertension/Hypotension:  BP currently stable on low dose dopamine.  3.  L hemispheric hemorrhagic CVA:  S/p decompression/craniotomy.  Per NSU.  Signed, Nicolasa Duckinghristopher Berge, NP 05/23/2014, 4:18 PM   Patient seen and discussed with NP Brion AlimentBerge, I agree with his documentation. 69 y.o.female with hx of AAA with prior stent admitted as a code stroke patient, found to have acute ICH. Was intubated for airway protection. At that time significantly hypertensive and started on nicardapine gtt. She had left frontoparietal craniotomy for evacuation 05/20/14. We are consulted for bradycardia and transient hypotension  Admit CT head: large left  parietal ICH with 7.5 mm of midline shift Repeat CT head: partially evacuated left frontoparietal hematoma, 3 mm left to right shift midleine. K 4, Cr 0.56, Hgb 9.6, Plt 279, Mg 2.1, TSSH 0.825, trop neg,  CXR decreased edema pattern and decreased atelectasis Echo LVEF  65-70%, mild LVH, grade I diastolic dysfunction EKG 05/20/14 SR  Telemetry shows various findings including SR and sinus bradycardia. There are noted sinus pauses up to 3 seconds, as well as times where an ectopic atrial focus takes over pacing at a rate in the mid 50s. There is also grouped beating with sequentially shortening P-P intervals at times suggesting SA node exit block. She is on no AV nodal agents. Suspect this is likely neural/vagally mediated given her intracranial process as opposed to primary conduction disease. Continue low dose dopamine, can use prn atropine 0.5mg  as well. If bp becomes too elevated on dopamine can change to isoproterenol. She currently has a steady ectopic atrial rhythm in high 50s with normal bp, do not see indication for temp pacer but if troubles overnight please contact our overnight call physician. Will have EP evaluate in AM.   Dominga Ferry MD

## 2014-05-23 NOTE — Progress Notes (Signed)
Called in bedside.  Patient is brady and hypertensive (30's and 200's).  Concern for another intracranial event.  Will order a stat head CT.  Alyson ReedyWesam G. Yacoub, M.D. [facility]. Pager: [phone]. After hours pager: [phone].

## 2014-05-23 NOTE — Progress Notes (Signed)
eLink Physician-Brief Progress Note Patient Name: Alvira PhilipsGeraldine Michelini DOB: 03-18-1945 MRN: [id number]   Date of Service  05/23/2014  HPI/Events of Note  Ct head :  No new hemorrhage or extension  eICU Interventions  Cont current care      Intervention Category Intermediate Interventions: Diagnostic test evaluation  Shan Levansatrick Dorlis Judice 05/23/2014, 6:16 PM

## 2014-05-23 NOTE — Progress Notes (Signed)
Patient ID: Carolyn [name], female   DOB: 1945-06-10, 69 y.o.   MRN: [id number] BP 127/54 mmHg  Pulse 54  Temp(Src) 98.7 F (37.1 C) (Axillary)  Resp 15  Ht 5\' 4"  (1.626 m)  Wt 99.8 kg (220 lb 0.3 oz)  BMI 37.75 kg/m2  SpO2 98% Sedated, intubated Moving purposefully with left upper extremity Wound is clean, dry, and without signs of infection Drain removed today without problem Perrl, +cough Exam unchanged.

## 2014-05-24 ENCOUNTER — Inpatient Hospital Stay (HOSPITAL_COMMUNITY): Payer: BLUE CROSS/BLUE SHIELD

## 2014-05-24 DIAGNOSIS — J9600 Acute respiratory failure, unspecified whether with hypoxia or hypercapnia: Secondary | ICD-10-CM | POA: Insufficient documentation

## 2014-05-24 LAB — CBC
HCT: 32.9 % — ABNORMAL LOW (ref 36.0–46.0)
HEMOGLOBIN: 10.1 g/dL — AB (ref 12.0–15.0)
MCH: 27.2 pg (ref 26.0–34.0)
MCHC: 30.7 g/dL (ref 30.0–36.0)
MCV: 88.7 fL (ref 78.0–100.0)
Platelets: 268 10*3/uL (ref 150–400)
RBC: 3.71 MIL/uL — ABNORMAL LOW (ref 3.87–5.11)
RDW: 16 % — AB (ref 11.5–15.5)
WBC: 11.8 10*3/uL — ABNORMAL HIGH (ref 4.0–10.5)

## 2014-05-24 LAB — GLUCOSE, CAPILLARY
GLUCOSE-CAPILLARY: 129 mg/dL — AB (ref 70–99)
Glucose-Capillary: 115 mg/dL — ABNORMAL HIGH (ref 70–99)
Glucose-Capillary: 120 mg/dL — ABNORMAL HIGH (ref 70–99)
Glucose-Capillary: 118 mg/dL — ABNORMAL HIGH (ref 70–99)
Glucose-Capillary: 135 mg/dL — ABNORMAL HIGH (ref 70–99)

## 2014-05-24 LAB — TRIGLYCERIDES: TRIGLYCERIDES: 181 mg/dL — AB (ref <150)

## 2014-05-24 LAB — BASIC METABOLIC PANEL
Anion gap: 8 (ref 5–15)
BUN: 19 mg/dL (ref 6–20)
CHLORIDE: 108 mmol/L (ref 101–111)
CO2: 30 mmol/L (ref 22–32)
Calcium: 8.6 mg/dL — ABNORMAL LOW (ref 8.9–10.3)
Creatinine, Ser: 0.52 mg/dL (ref 0.44–1.00)
GFR calc non Af Amer: >60 mL/min (ref >60)
Glucose, Bld: 133 mg/dL — ABNORMAL HIGH (ref 70–99)
Potassium: 3.7 mmol/L (ref 3.5–5.1)
Sodium: 146 mmol/L — ABNORMAL HIGH (ref 135–145)

## 2014-05-24 LAB — PHOSPHORUS: Phosphorus: 3.1 mg/dL (ref 2.5–4.6)

## 2014-05-24 LAB — MAGNESIUM: Magnesium: 2 mg/dL (ref 1.7–2.4)

## 2014-05-24 MED ORDER — ENOXAPARIN SODIUM 40 MG/0.4ML ~~LOC~~ SOLN
40.0000 mg | SUBCUTANEOUS | Status: DC
  Administered 2014-05-24: 40 mg via SUBCUTANEOUS
  Filled 2014-05-24: qty 0.4

## 2014-05-24 MED ORDER — FENTANYL CITRATE (PF) 100 MCG/2ML IJ SOLN: 25.0000 ug | INTRAMUSCULAR | Status: DC | PRN

## 2014-05-24 MED ORDER — HEPARIN SODIUM (PORCINE) 5000 UNIT/ML IJ SOLN
5000.0000 [IU] | Freq: Three times a day (TID) | INTRAMUSCULAR | Status: DC
  Administered 2014-05-24 – 2014-06-03 (×30): 5000 [IU] via SUBCUTANEOUS
  Filled 2014-05-24 (×35): qty 1

## 2014-05-24 NOTE — Progress Notes (Signed)
Chaplain initiated follow up with pt daughter, Carolyn [name]. Carolyn [name] expressed difficulty in honoring her mothers wishes when there is a chance (however small) of recovery. Chaplain encouraged pt family to use living will as pt's voice during family discussions. Chaplain offered emotional support and prayer. Chaplain will continue to follow.    05/24/14 1100  Clinical Encounter Type  Visited With Patient  Visit Type Follow-up;Spiritual support  Spiritual Encounters  Spiritual Needs Emotional;Prayer  Stress Factors  Family Stress Factors Major life changes  Jakeline Dave, Mayer MaskerCourtney F, Chaplain 05/24/2014 11:19 AM

## 2014-05-24 NOTE — Progress Notes (Signed)
PT Cancellation Note  Patient Details Name: Carolyn CourtGeraldine [name] MRN: [id number] DOB: February 27, 1945   Cancelled Treatment:    Reason Eval/Treat Not Completed: Patient not medically ready.  Pt intubated and sedated. 05/24/2014  [place] BingKen Yandell Mcjunkins, PT [phone]  (pager)   Evany Schecter, Eliseo GumKenneth V 05/24/2014, 11:36 AM

## 2014-05-24 NOTE — Procedures (Signed)
ELECTROENCEPHALOGRAM REPORT   Patient: Carolyn [name]      Room #: 6N623M05 Age: 69 y.o.        Sex: female Referring Physician: Dr Pearlean BrownieSethi Report Date:  05/24/2014        Interpreting Physician: Omelia BlackwaterSUMNER, Arrin Pintor JUSTIN  History: Carolyn [name] is an 69 y.o. female presenting as code stroke found to have large left parietal ICH  Medications:  Scheduled: . antiseptic oral rinse  7 mL Mouth Rinse QID  . chlorhexidine  15 mL Mouth Rinse BID  . Chlorhexidine Gluconate Cloth  6 each Topical Q0600  . enoxaparin (LOVENOX) injection  40 mg Subcutaneous Q24H  . feeding supplement (VITAL HIGH PROTEIN)  1,000 mL Per Tube Q24H  . insulin aspart  0-15 Units Subcutaneous 6 times per day  . levETIRAcetam  500 mg Intravenous Q12H  . levothyroxine  75 mcg Oral QAC breakfast  . mupirocin ointment  1 application Nasal BID  . pantoprazole sodium  40 mg Per Tube Q1200  . senna-docusate  1 tablet Per Tube BID  . sodium chloride  10-40 mL Intracatheter Q12H    Conditions of Recording:  This is a 16 channel EEG carried out with the patient in the intubated state.  Description:  The waking background activity consists of a low voltage, asymmetric, poorly organized theta activity with brief periods of posterior alpha activity. Low voltage slowing is seen predominantly on the left side. No sharp wave or epileptiform activity is noted.  Normal sleep architecture is not obtained. Hyperventilation and intermittent photic stimulation was not performed.   IMPRESSION: Abnormal EEG due to generalized slowing indicating a mild to moderate cerebral disturbance (encephalopathy). Focal low voltage slowing is noted in the left hemisphere indicating a focal cerebral disturbance. No seizure activity noted.    Elspeth Choeter Lametria Klunk, DO [facility] [phone]  If 7pm- 7am, please page neurology on call as listed in AMION. 05/24/2014, 3:01 PM

## 2014-05-24 NOTE — Progress Notes (Signed)
OT Cancellation Note  Patient Details Name: [name] CourtGeraldine Deshaies MRN: [id number] DOB: [birth year]   Cancelled Treatment:    Reason Eval/Treat Not Completed: Patient not medically ready Pt intubated and sedated. Will follow up tomorrow.  [facility] HospitalWARD,HILLARY  Riata Ikeda, OTR/L  [phone] 05/24/2014 05/24/2014, 11:23 AM

## 2014-05-24 NOTE — Progress Notes (Signed)
STROKE TEAM PROGRESS NOTE   HISTORY Carolyn [name] is a 69 y.o. female brought to hospital as code stroke. EMS was called to her work and told she was noted to have right arm weakness then noted to complain of severe HA which then was followed by sudden unconsciousness. LKW 05/20/2014 at 1240. On arrival she was unconscious on NRB but not protectin g airway well. CT head was obtained and showed a large left parietal ICH. While in ED patient has one episode emesis and intubated for air way protection. NIH stroke score was 31. Blood pressure was markedly elevated, and at one point was 241/123. Emergency management of hypertension was undertaken with use of Cardene intravenously. She was admitted to the neuro ICU for further evaluation and treatment.   SUBJECTIVE (INTERVAL HISTORY)  She remains with depressed mental status and aphasic and not following commands and dense hemiplegia. Blood pressure adequately controlled. She was not able to be weaned off the ventilator   OBJECTIVE Temp:  [98.7 F (37.1 C)-99.2 F (37.3 C)] 99.2 F (37.3 C) (05/02 0755) Pulse Rate:  [51-71] 67 (05/02 0819) Cardiac Rhythm:  [-] Junctional rhythm (05/02 0800) Resp:  [13-21] 16 (05/02 0819) BP: (109-168)/(37-70) 136/60 mmHg (05/02 0819) SpO2:  [97 %-100 %] 99 % (05/02 0819) Arterial Line BP: (82-186)/(45-94) 183/61 mmHg (05/02 0800) FiO2 (%):  [30 %] 30 % (05/02 0820) Weight:  [100.1 kg (220 lb 10.9 oz)] 100.1 kg (220 lb 10.9 oz) (05/02 0400)   Recent Labs Lab 05/23/14 1516 05/23/14 1938 05/23/14 2319 05/24/14 0317 05/24/14 0734  GLUCAP 136* 110* 130* 115* 129*    Recent Labs Lab 05/21/14 0400 05/21/14 0703 05/22/14 0515 05/23/14 0350 05/24/14 0400  NA 136 138 139 143 146*  K 5.9* 3.7 3.4* 4.0 3.7  CL 102 104 103 109 108  CO2 GLUCOSE 163* 155* 142* 150* 133*  BUN CREATININE 0.73 0.62 0.72 0.56 0.52  CALCIUM 8.3* 8.7 8.9 8.8* 8.6*  MG  --   --   --  2.1 2.0   PHOS  --   --   --  2.7 3.1    Recent Labs Lab 05/20/14 1343 05/21/14 0400  AST 28 73*  ALT 29 34  ALKPHOS 125* 108  BILITOT 0.6 1.1  PROT 6.7 6.6  ALBUMIN 3.9 3.6    Recent Labs Lab 05/20/14 1343  05/20/14 2201 05/21/14 0400 05/22/14 0515 05/23/14 0350 05/24/14 0400  WBC 9.7  --  17.0* 17.4* 15.5* 13.5* 11.8*  NEUTROABS 6.3  --   --   --   --   --   --   HGB 12.7  < > 12.0 11.0* 10.0* 9.6* 10.1*  HCT 40.0  < > 38.4 34.3* 32.0* 31.7* 32.9*  MCV 85.5  --  86.7 85.1 86.3 87.3 88.7  PLT 234  --  247 302 275 279 268  < > = values in this interval not displayed. No results for input(s): CKTOTAL, CKMB, CKMBINDEX, TROPONINI in the last 168 hours. No results for input(s): LABPROT, INR in the last 72 hours. No results for input(s): COLORURINE, LABSPEC, PHURINE, GLUCOSEU, HGBUR, BILIRUBINUR, KETONESUR, PROTEINUR, UROBILINOGEN, NITRITE, LEUKOCYTESUR in the last 72 hours.  Invalid input(s): APPERANCEUR     Component Value Date/Time   CHOL 242* 05/21/2014 1048   TRIG 181* 05/24/2014 0400   HDL 61 05/21/2014 1048   CHOLHDL 4.0 05/21/2014 1048   VLDL 49* 05/21/2014 1048  LDLCALC 132* 05/21/2014 1048   Lab Results  Component Value Date   HGBA1C 5.9* 05/21/2014      Component Value Date/Time   LABOPIA NONE DETECTED 05/20/2014 1513   COCAINSCRNUR NONE DETECTED 05/20/2014 1513   LABBENZ NONE DETECTED 05/20/2014 1513   AMPHETMU NONE DETECTED 05/20/2014 1513   THCU NONE DETECTED 05/20/2014 1513   LABBARB NONE DETECTED 05/20/2014 1513     Recent Labs Lab 05/20/14 1343  ETH <5    Ct Head Wo Contrast  05/21/2014   IMPRESSION: 1. Interval decompression of the left-sided cerebral hemorrhage, with subdural drain in place along the craniotomy site. The hematoma is significantly reduced in volume. There is gas in the center of the hematoma and a small amount of gas and blood products in the extra-axial space on the left. 2. Intraventricular hemorrhage in both lateral ventricles  and in the third and fourth ventricle, without hydrocephalus. 3. Faintly increased density along the right temporal lobe is compatible with a small amount of new subarachnoid hemorrhage along the right temporal lobe. 4. 3 mm of left-to-right midline shift.     05/20/2014   IMPRESSION: Large left frontal parietal intracerebral hemorrhage with a 7.5 mm of midline shift. The hemorrhage has extended into the left lateral ventricle.    Dg Chest Port 1 View  05/21/2014    IMPRESSION: NG tube placed.  Stable bibasilar atelectasis.  No evidence of pulmonary edema.     05/20/2014   IMPRESSION: Endotracheal tube tip at the carina with the tip directed toward the right main bronchus. No edema or consolidation. No pneumothorax.    2D echo -  estimated ejection fraction was in the range of 65% to 70%   PHYSICAL EXAM  General - Well nourished, well developed elderly Caucasian lady, intubated   Ophthalmologic - fundi not visualized due to incorporation.  Cardiovascular - Regular rate and rhythm with no murmur.  Neuro - intubated and off sedation, . Not opening eyes on voice and not following commands. Left head dressing dry   PERRL, pupil 3mm reactive to light, doll's eye present, corneal reflex and gag and cough reflex weakly positive. Breathing over the vent. Spontaneously moving LUE and LLE, but 0/5 RUE on pain and trace withdraw on RLE on pain stimulation, no babinski bilaterally. Reflex 1+.    ASSESSMENT/PLAN [name] is a 69 y.o. female with history of hypertension presenting with right arm weakness, severe headache, and subsequent unconsciousness.  She did not receive IV t-PA due to a large left frontal parietal intracerebral hemorrhage.  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   Resultant  Intubated and right hemiplegia  Slow to wake up  Repeat CT showed smaller hematoma and decreased midline shift2SCDs for VTE prophylaxis, will start lovenox  for VTE prophylaxis   NPO  aspirin 81 mg orally every day and clopidogrel 75 mg orally every day prior to admission, now on no antithrombotic due to hemorrhage  Ongoing aggressive stroke risk factor management  Check EEG  Therapy recommendations: Pending  Disposition: Pending  Cerebral edema  Midline shift improved s/p hematoma evacuation  Repeat CT no evidence of hydro   Will hold off hypertonic saline.   Neuro check  Hypertension  Home meds: Toprol-XL  Consider adding on anti hypertensives to titrate down clevedipine.   Unstable  Switch to cleviplex drip  Put on po BP meds in hope to wean off drip  BP goal < 180  Close monitoring  On fentanyl.  Bradycardia and pauses at night. On dopamine gtt. Now off Cardiology feel it is vagally mediated   Hyperlipidemia  LDL 132  Other Stroke Risk Factors  Advanced age  Obesity, Body mass index is 37.86 kg/(m^2).   Other Active Problems  Intubated  Hyperkalemia  hyperglycemia  Mild anemia  Other Pertinent History  History of abdominal aortic aneurysm repair  Hospital day # 4 I have personally examined this patient, reviewed notes, independently viewed imaging studies, participated in medical decision making and plan of care. I have made any additions or clarifications directly to the above note. The patient's mental status remains depressed with aphasia and dense right hemiplegia despite hematoma evacuation and decreasing hemorrhage size and edema. We will check EEG to rule out any nonconvulsive seizures and maintain strict control of hypertension. I had a long discussion of the bedside with the patient's daughter regarding her neurological condition, prognosis and answered questions. Hopefully she begins to wake up the next few days otherwise are need to consider long-term ventilatory support. The daughter will discuss with the patient's son and make a decision of the next few days whether they want to  pursue This patient is critically ill and at significant risk of neurological worsening, death and care requires constant monitoring of vital signs, hemodynamics,respiratory and cardiac monitoring,review of multiple databases, neurological assessment, discussion with family, other specialists and medical decision making of high complexity.I have made any additions or clarifications directly to the above note.  I spent 30 minutes of neurocritical care time  in the care of  this patient.  Delia Heady, MD Medical Director [facility] Pager: [phone] 05/24/2014 4:19 PM   To contact Stroke Continuity provider, please refer to [email or web address]. After hours, contact General Neurology

## 2014-05-24 NOTE — Progress Notes (Signed)
Patient ID: Carolyn CourtGeraldine [name], female   DOB: [birth year], 69 y.o.   MRN: [id number] BP 161/55 mmHg  Pulse 79  Temp(Src) 99.8 F (37.7 C) (Axillary)  Resp 14  Ht 5\' 4"  (1.626 m)  Wt 100.1 kg (220 lb 10.9 oz)  BMI 37.86 kg/m2  SpO2 96% Opens eyes, not sure if she is tracking. Not following commands Does move left upper extremity purposefully Would prefer heparin v lovenox. Has new occipital infarct seen on ct. bAsal cisterns widely patent.  EEG showed encephalopathy No real changes.

## 2014-05-24 NOTE — Progress Notes (Signed)
Bedside EEG completed, results pending. 

## 2014-05-24 NOTE — Progress Notes (Signed)
Patient ID: Carolyn [name], female   DOB: [birth year], 70 y.o.   MRN: [id number]    Subjective:  Intubated   Objective:  Filed Vitals:   05/24/14 0800 05/24/14 0819 05/24/14 0900 05/24/14 1000  BP: 136/60 136/60 158/47 164/61  Pulse: 61 67 67 66  Temp:      TempSrc:      Resp: Height:      Weight:      SpO2: 99% 99% 99% 100%    Intake/Output from previous day:  Intake/Output Summary (Last 24 hours) at 05/24/14 1104 Last data filed at 05/24/14 1000  Gross per 24 hour  Intake   2019 ml  Output   2275 ml  Net   -256 ml    Physical Exam: Blood pressure 120/40, pulse 62, temperature 98.7 F (37.1 C), temperature source Axillary, resp. rate 14, height  (1.626 m), weight 220 lb 0.3 oz (99.8 kg), SpO2 98 %.  General: intubated, sedated. Psych: intubated, sedated. Neuro: intubated, sedated. HEENT: left scalp surgical site clean/dry. Neck: Supple without bruits or JVD. Lungs: Resp regular and unlabored, CTA. Heart: RRR no s3, s4, or murmurs. Abdomen: Soft, non-tender, non-distended, BS + x 4.  Extremities: No clubbing, cyanosis or edema. DP/PT/Radials 2+ and equal bilaterally.  Lab Results: Basic Metabolic Panel:  Recent Labs  40/98/11 0350 05/24/14 0400  NA 143 146*  K 4.0 3.7  CL 109 108  CO2 27 30  GLUCOSE 150* 133*  BUN 19 19  CREATININE 0.56 0.52  CALCIUM 8.8* 8.6*  MG 2.1 2.0  PHOS 2.7 3.1   CBC:  Recent Labs  05/23/14 0350 05/24/14 0400  WBC 13.5* 11.8*  HGB 9.6* 10.1*  HCT 31.7* 32.9*  MCV 87.3 88.7  PLT 279 268   Fasting Lipid Panel:  Recent Labs  05/24/14 0400  TRIG 181*   Thyroid Function Tests: No results for input(s): TSH, T4TOTAL, T3FREE, THYROIDAB in the last 72 hours.  Invalid input(s): FREET3 Anemia Panel: No results for input(s): VITAMINB12, FOLATE, FERRITIN, TIBC, IRON, RETICCTPCT in the last 72 hours.  Imaging: Ct Head Wo Contrast  05/23/2014   CLINICAL DATA:  Followup postoperative hemorrhage,  bradycardia hypertension  EXAM: CT HEAD WITHOUT CONTRAST  TECHNIQUE: Contiguous axial images were obtained from the base of the skull through the vertex without intravenous contrast.  COMPARISON:  05/22/1958  FINDINGS: Stable acute hemorrhage mixed with air near the left vertex measuring about 62 x 32 mm. Stable surrounding white matter edema. Hemorrhage layers dependently within the ventricles, with no change in the appearance of the ventricles and no evidence of hydrocephalus. Increased acute hemorrhage in the fourth ventricle.  Focal transcortical low-attenuation left occipital lobe measures 25 x 24 mm. This is new when compared to the prior study.  Findings of left craniotomy again identified unchanged. Minimal midline shift from left-to-right of about 3-4 mm stable.  IMPRESSION: No significant change in the volume of intracranial hemorrhage or intraventricular hemorrhage. No hydrocephalus.  New transcortical focus of low-attenuation left occipital lobe, not identified on 05/22/2014 study. This is concerning for developing infarction.   Electronically Signed   By: Esperanza Heir M.D.   On: 05/23/2014 18:08   Dg Chest Port 1 View  05/24/2014   CLINICAL DATA:  69 year old female with intracranial hemorrhage status post craniectomy and evacuation. Initial encounter.  EXAM: PORTABLE CHEST - 1 VIEW  COMPARISON:  05/23/2014 and earlier.  FINDINGS: Portable AP semi upright view at 0526 hrs. Endotracheal tube tip  in good position projecting between the level the clavicles and carina. Enteric tube in place, tip not included. Left side PICC line re- identified.  The patient is now rotated to the right. Lower lung volumes. Stable cardiac size and mediastinal contours. No pneumothorax. Small left pleural effusion suspected with stable retrocardiac opacity. No areas of worsening ventilation.  IMPRESSION: 1.  Stable lines and tubes. 2. Lower lung volumes. Left pleural effusion and lung base atelectasis not significantly  changed.   Electronically Signed   By: Odessa FlemingH  Hall M.D.   On: 05/24/2014 07:28   Dg Chest Port 1 View  05/23/2014   CLINICAL DATA:  Nontraumatic intracranial hemorrhage. Hypertensive emergency. Acute respiratory failure. On ventilator.  EXAM: PORTABLE CHEST - 1 VIEW  COMPARISON:  05/22/2014  FINDINGS: Endotracheal tube and nasogastric tube remain in appropriate position. Low lung volumes are again demonstrated. Heart size is stable. Decreased interstitial infiltrates seen, consistent with decreased interstitial edema. Decreased atelectasis also noted in the lateral left lung base since prior study. No new or worsening areas of pulmonary opacity seen. No evidence of pneumothorax.  IMPRESSION: Decreased interstitial edema pattern and decreased atelectasis in lateral left lung base. No acute findings identified.   Electronically Signed   By: Myles RosenthalJohn  Stahl M.D.   On: 05/23/2014 09:11    Cardiac Studies:  ECG:   SR low atrial foci no AV block   Telemetry:  NSR PAC;s no bradycardia   Echo: 05/21/14  Hyperdynamic LV function EF 65-70% no valve disease   Medications:   . antiseptic oral rinse  7 mL Mouth Rinse QID  . chlorhexidine  15 mL Mouth Rinse BID  . Chlorhexidine Gluconate Cloth  6 each Topical Q0600  . enoxaparin (LOVENOX) injection  40 mg Subcutaneous Q24H  . feeding supplement (VITAL HIGH PROTEIN)  1,000 mL Per Tube Q24H  . insulin aspart  0-15 Units Subcutaneous 6 times per day  . levETIRAcetam  500 mg Intravenous Q12H  . levothyroxine  75 mcg Oral QAC breakfast  . mupirocin ointment  1 application Nasal BID  . pantoprazole sodium  40 mg Per Tube Q1200  . pneumococcal 23 valent vaccine  0.5 mL Intramuscular Tomorrow-1000  . senna-docusate  1 tablet Per Tube BID  . sodium chloride  10-40 mL Intracatheter Q12H     . sodium chloride 10 mL/hr at 05/24/14 0700  . DOPamine Stopped (05/24/14 0730)  . fentaNYL infusion INTRAVENOUS Stopped (05/24/14 0930)    Assessment/Plan:  Bradycardia:   Likely vagally mediated from CNS event Off dompamine now and NSR rates low 60's  No AV Block.  Do not think this will be a significant issue for patient and no need for pacing or further cardiac w/u  Charlton HawsPeter Kooper Chriswell 05/24/2014, 11:04 AM

## 2014-05-24 NOTE — Progress Notes (Signed)
PULMONARY  / CRITICAL CARE MEDICINE  Name: Carolyn [name] MRN: [id number] DOB: 1946/01/22    LOS: 4  REFERRING MD :  Dr. Roseanne Reno  CHIEF COMPLAINT:  ICH  BRIEF PATIENT DESCRIPTION:  19 F with PMH of htn and recent stent of AAA performed @ [facility] presented to the ED 4/28 with L hemispheric hemorrhagic CVA. Intubated in ED due to depressed LOC and admitted to [facility] service with Stroke and NS consultants. 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    MAJOR EVENTS/TEST RESULTS: 4/28 admitted as above to [facility] service. Stroke team consultation. NS consultation 4/28 CT head: Large left frontal parietal intracerebral hemorrhage with a 7.5 mm of midline shift. The hemorrhage has extended into the left lateral ventricle 4/28 Procedure: Left frontoparietal craniotomy for evacuation of intracerebral hematoma. Surgeon: Gerlene Fee 4/29 CT head: Interval decompression of the left-sided cerebral hemorrhage, with subdural drain in place along the craniotomy site. The hematoma is significantly reduced in volume. There is gas in the center of the hematoma and a small amount of gas and blood products in the extra-axial space on the left. Intraventricular hemorrhage in both lateral ventricles and in the third and fourth ventricle, without hydrocephalus. Faintly increased density along the right temporal lobe is compatible with a small amount of new subarachnoid hemorrhage along the right temporal lobe. 3 mm of left-to-right midline shift 4/29 Remains intubated, sedated, on nicardipine, propofol 4/29 TTE: nml LV fn 4/30 CT head: "improved" 5/1 head CT (brady) >> no change  INDWELLING DEVICES:: ETT 4/28 >>  R radial A-line 4/28 >>  PICC (ordered) 4/29 >>   MICRO DATA: MRSA PCR 4/28 >> POS  ANTIMICROBIALS:   None  INTERVAL HISTORY:  Afebrile A;line BP reading 20 points higher than cuff  VITAL SIGNS: Temp:  [98.7 F (37.1 C)-99.2 F (37.3 C)] 99.2 F (37.3 C) (05/02 0755) Pulse  Rate:  [51-71] 67 (05/02 0819) Resp:  [13-21] 16 (05/02 0819) BP: (109-168)/(37-90) 136/60 mmHg (05/02 0819) SpO2:  [97 %-100 %] 99 % (05/02 0819) Arterial Line BP: (82-186)/(45-94) 183/61 mmHg (05/02 0800) FiO2 (%):  [30 %] 30 % (05/02 0820) Weight:  [220 lb 10.9 oz (100.1 kg)] 220 lb 10.9 oz (100.1 kg) (05/02 0400) HEMODYNAMICS:   VENTILATOR SETTINGS: Vent Mode:  [-] PRVC FiO2 (%):  [30 %] 30 % Set Rate:  [16 bmp] 16 bmp Vt Set:  [440 mL] 440 mL PEEP:  [5 cmH20] 5 cmH20 Plateau Pressure:  [14 cmH20-17 cmH20] 14 cmH20   INTAKE / OUTPUT: Intake/Output      05/01 0701 - 05/02 0700 05/02 0701 - 05/03 0700   I.V. (mL/kg) 490 (4.9) 10 (0.1)   NG/GT 1565    IV Piggyback 210    Total Intake(mL/kg) 2265 (22.6) 10 (0.1)   Urine (mL/kg/hr) 2575 (1.1)    Emesis/NG output     Drains     Total Output 2575     Net -310 +10         PHYSICAL EXAMINATION: General:  Acutely ill, intuabted Neuro: RASS 0, R hemiplegia improving, left side normal- follows commands HEENT: Surgical dressing in place Cardiovascular:  Reg, no M Lungs: clear anteriorly Abdomen:  Obese, soft, +BS Ext: warm, no edema  LABS: Cbc  Recent Labs Lab 05/22/14 0515 05/23/14 0350 05/24/14 0400  WBC 15.5* 13.5* 11.8*  HGB 10.0* 9.6* 10.1*  HCT 32.0* 31.7* 32.9*  PLT 275 279 268   Chemistry  Recent Labs Lab 05/22/14 0515 05/23/14 0350 05/24/14 0400  NA  139 143 146*  K 3.4* 4.0 3.7  CL 103 109 108  CO2 28 27 30   BUN 12 19 19   CREATININE 0.72 0.56 0.52  CALCIUM 8.9 8.8* 8.6*  MG  --  2.1 2.0  PHOS  --  2.7 3.1  GLUCOSE 142* 150* 133*   Liver fxn  Recent Labs Lab 05/20/14 1343 05/21/14 0400  AST 28 73*  ALT 29 34  ALKPHOS 125* 108  BILITOT 0.6 1.1  PROT 6.7 6.6  ALBUMIN 3.9 3.6   coags  Recent Labs Lab 05/20/14 1343 05/21/14 0400  APTT 30  --   INR 0.91 0.95   Sepsis markers No results for input(s): LATICACIDVEN, PROCALCITON in the last 168 hours. Cardiac markers No results for  input(s): CKTOTAL, CKMB, TROPONINI in the last 168 hours. BNP No results for input(s): PROBNP in the last 168 hours. ABG  Recent Labs Lab 05/20/14 1356 05/20/14 2155 05/23/14 0408  PHART  --  7.368 7.397  PCO2ART  --  41.7 45.8*  PO2ART  --  74.9* 85.3  HCO3  --  23.7 27.6*  TCO2 24 25.0 29.0   CBG trend  Recent Labs Lab 05/23/14 1516 05/23/14 1938 05/23/14 2319 05/24/14 0317 05/24/14 0734  GLUCAP 136* 110* 130* 115* 129*   CXR: bibasilar atx  ASSESSMENT / PLAN:  PULMONARY ASSESSMENT: Acute respiratory failure due to CVA, AMS PLAN:   May begin PS trials but no extubation given mental status. 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 Hold all anti-HTN. SBP goal < 140 mmHg Can dc aline  RENAL  ASSESSMENT:  Hypokalemia, resolved Hyperkalemia, resolved PLAN:   Monitor BMET intermittently Monitor I/Os Correct electrolytes as indicated  GASTROINTESTINAL  ASSESSMENT:  No acute issues PLAN:   SUP: enteral PPI ct TFs   HEMATOLOGIC  ASSESSMENT:   Plavix, ASA prior to admission (indication: recent AAA stent)  Received plt transfusion in ED PLAN:  DVT px: SCDs Monitor CBC intermittently Transfuse per usual ICU guidelines  INFECTIOUS  ASSESSMENT: No evidence of infection PLAN:   Monitor temp, WBC count Micro and abx as above  ENDOCRINE  ASSESSMENT:   Hypothyroidism (L-thyroxine 75 mcg daily PTA) Hyperglycemia without prior DM history PLAN:   Cont L-thyroxine SSI  NEUROLOGIC  ASSESSMENT:   Hypertensive LCH S/P craniotomy 4/28 Post op pain ICU associated discomfort PLAN:   RASS goal: 0 Fentanyl drip only. Avoid Precedex  - caused significant bradycardia and hypotension. Daily WUA Post op mgmt per NS Further  stroke eval per Stroke team  The patient is critically ill with multiple organ systems failure and requires high complexity decision making for assessment and support, frequent evaluation and titration of therapies, application of advanced monitoring technologies and extensive interpretation of multiple databases.   Critical Care Time devoted to patient care services described in this note is  35  Minutes. This time reflects time of care of this signee Dr Koren BoundWesam Yacoub. This critical care time does not reflect procedure time, or teaching time or supervisory time of PA/NP/Med student/Med Resident etc but could involve care discussion time.  Cyril Mourningakesh Alva MD. Tonny BollmanFCCP. [place] [facility] Pager [phone] If no response call [phone]

## 2014-05-25 ENCOUNTER — Inpatient Hospital Stay (HOSPITAL_COMMUNITY): Payer: BLUE CROSS/BLUE SHIELD

## 2014-05-25 LAB — BASIC METABOLIC PANEL
ANION GAP: 12 (ref 5–15)
BUN: 19 mg/dL (ref 6–20)
CALCIUM: 9.1 mg/dL (ref 8.9–10.3)
CO2: 28 mmol/L (ref 22–32)
Chloride: 106 mmol/L (ref 101–111)
Creatinine, Ser: 0.52 mg/dL (ref 0.44–1.00)
Glucose, Bld: 136 mg/dL — ABNORMAL HIGH (ref 70–99)
POTASSIUM: 3.7 mmol/L (ref 3.5–5.1)
Sodium: 146 mmol/L — ABNORMAL HIGH (ref 135–145)

## 2014-05-25 LAB — CBC
HEMATOCRIT: 36.5 % (ref 36.0–46.0)
Hemoglobin: 10.8 g/dL — ABNORMAL LOW (ref 12.0–15.0)
MCH: 26.2 pg (ref 26.0–34.0)
MCHC: 29.6 g/dL — ABNORMAL LOW (ref 30.0–36.0)
MCV: 88.4 fL (ref 78.0–100.0)
Platelets: 284 10*3/uL (ref 150–400)
RBC: 4.13 MIL/uL (ref 3.87–5.11)
RDW: 15.7 % — ABNORMAL HIGH (ref 11.5–15.5)
WBC: 12 10*3/uL — ABNORMAL HIGH (ref 4.0–10.5)

## 2014-05-25 LAB — GLUCOSE, CAPILLARY
GLUCOSE-CAPILLARY: 116 mg/dL — AB (ref 70–99)
GLUCOSE-CAPILLARY: 124 mg/dL — AB (ref 70–99)
GLUCOSE-CAPILLARY: 137 mg/dL — AB (ref 70–99)
Glucose-Capillary: 121 mg/dL — ABNORMAL HIGH (ref 70–99)
Glucose-Capillary: 127 mg/dL — ABNORMAL HIGH (ref 70–99)
Glucose-Capillary: 132 mg/dL — ABNORMAL HIGH (ref 70–99)

## 2014-05-25 MED ORDER — FENTANYL CITRATE (PF) 100 MCG/2ML IJ SOLN
25.0000 ug | INTRAMUSCULAR | Status: DC | PRN
  Administered 2014-05-25 – 2014-05-26 (×2): 50 ug via INTRAVENOUS
  Filled 2014-05-25 (×2): qty 2

## 2014-05-25 MED ORDER — ALTEPLASE 2 MG IJ SOLR
2.0000 mg | Freq: Once | INTRAMUSCULAR | Status: AC
  Administered 2014-05-25: 2 mg
  Filled 2014-05-25: qty 2

## 2014-05-25 MED ORDER — FREE WATER
250.0000 mL | Freq: Four times a day (QID) | Status: DC
  Administered 2014-05-25 – 2014-05-30 (×19): 250 mL

## 2014-05-25 MED ORDER — FREE WATER: 200.0000 mL | Freq: Four times a day (QID) | Status: DC

## 2014-05-25 NOTE — Progress Notes (Signed)
STROKE TEAM PROGRESS NOTE   HISTORY Carolyn [name] is a 69 y.o. female brought to hospital as code stroke. EMS was called to her work and told she was noted to have right arm weakness then noted to complain of severe HA which then was followed by sudden unconsciousness. LKW 05/20/2014 at 1240. On arrival she was unconscious on NRB but not protectin g airway well. CT head was obtained and showed a large left parietal ICH. While in ED patient has one episode emesis and intubated for air way protection. NIH stroke score was 31. Blood pressure was markedly elevated, and at one point was 241/123. Emergency management of hypertension was undertaken with use of Cardene intravenously. She was admitted to the neuro ICU for further evaluation and treatment.   SUBJECTIVE (INTERVAL HISTORY)  She remains with depressed mental status and aphasic and not following commands and dense hemiplegia. Blood pressure adequately controlled. Daughter at bedside. Long 10 min discussion about prognosis and plan of care  OBJECTIVE Temp:  [98.5 F (36.9 C)-99.8 F (37.7 C)] 99.4 F (37.4 C) (05/03 0749) Pulse Rate:  [51-93] 69 (05/03 1000) Cardiac Rhythm:  [-] Normal sinus rhythm (05/03 0800) Resp:  [11-19] 14 (05/03 1000) BP: (134-184)/(42-75) 166/49 mmHg (05/03 1000) SpO2:  [91 %-100 %] 99 % (05/03 1000) FiO2 (%):  [30 %] 30 % (05/03 0746) Weight:  [221 lb 9 oz (100.5 kg)] 221 lb 9 oz (100.5 kg) (05/03 0425)   Recent Labs Lab 05/24/14 1559 05/24/14 2001 05/24/14 2359 05/25/14 0406 05/25/14 0743  GLUCAP 118* 135* 116* 137* 121*    Recent Labs Lab 05/21/14 0703 05/22/14 0515 05/23/14 0350 05/24/14 0400 05/25/14 0530  NA 138 139 143 146* 146*  K 3.7 3.4* 4.0 3.7 3.7  CL 104 103 109 108 106  CO2 24 28 27 30 28   GLUCOSE 155* 142* 150* 133* 136*  BUN 7 12 19 19 19   CREATININE 0.62 0.72 0.56 0.52 0.52  CALCIUM 8.7 8.9 8.8* 8.6* 9.1  MG  --   --  2.1 2.0  --   PHOS  --   --  2.7 3.1  --      Recent Labs Lab 05/20/14 1343 05/21/14 0400  AST 28 73*  ALT 29 34  ALKPHOS 125* 108  BILITOT 0.6 1.1  PROT 6.7 6.6  ALBUMIN 3.9 3.6    Recent Labs Lab 05/20/14 1343  05/21/14 0400 05/22/14 0515 05/23/14 0350 05/24/14 0400 05/25/14 0530  WBC 9.7  < > 17.4* 15.5* 13.5* 11.8* 12.0*  NEUTROABS 6.3  --   --   --   --   --   --   HGB 12.7  < > 11.0* 10.0* 9.6* 10.1* 10.8*  HCT 40.0  < > 34.3* 32.0* 31.7* 32.9* 36.5  MCV 85.5  < > 85.1 86.3 87.3 88.7 88.4  PLT 234  < > 302 275 279 268 284  < > = values in this interval not displayed. No results for input(s): CKTOTAL, CKMB, CKMBINDEX, TROPONINI in the last 168 hours. No results for input(s): LABPROT, INR in the last 72 hours. No results for input(s): COLORURINE, LABSPEC, PHURINE, GLUCOSEU, HGBUR, BILIRUBINUR, KETONESUR, PROTEINUR, UROBILINOGEN, NITRITE, LEUKOCYTESUR in the last 72 hours.  Invalid input(s): APPERANCEUR     Component Value Date/Time   CHOL 242* 05/21/2014 1048   TRIG 181* 05/24/2014 0400   HDL 61 05/21/2014 1048   CHOLHDL 4.0 05/21/2014 1048   VLDL 49* 05/21/2014 1048   LDLCALC 132* 05/21/2014 1048  Lab Results  Component Value Date   HGBA1C 5.9* 05/21/2014      Component Value Date/Time   LABOPIA NONE DETECTED 05/20/2014 1513   COCAINSCRNUR NONE DETECTED 05/20/2014 1513   LABBENZ NONE DETECTED 05/20/2014 1513   AMPHETMU NONE DETECTED 05/20/2014 1513   THCU NONE DETECTED 05/20/2014 1513   LABBARB NONE DETECTED 05/20/2014 1513     Recent Labs Lab 05/20/14 1343  ETH <5    Ct Head Wo Contrast  05/21/2014   IMPRESSION: 1. Interval decompression of the left-sided cerebral hemorrhage, with subdural drain in place along the craniotomy site. The hematoma is significantly reduced in volume. There is gas in the center of the hematoma and a small amount of gas and blood products in the extra-axial space on the left. 2. Intraventricular hemorrhage in both lateral ventricles and in the third and fourth  ventricle, without hydrocephalus. 3. Faintly increased density along the right temporal lobe is compatible with a small amount of new subarachnoid hemorrhage along the right temporal lobe. 4. 3 mm of left-to-right midline shift.     05/20/2014   IMPRESSION: Large left frontal parietal intracerebral hemorrhage with a 7.5 mm of midline shift. The hemorrhage has extended into the left lateral ventricle.    Dg Chest Port 1 View  05/21/2014    IMPRESSION: NG tube placed.  Stable bibasilar atelectasis.  No evidence of pulmonary edema.     05/20/2014   IMPRESSION: Endotracheal tube tip at the carina with the tip directed toward the right main bronchus. No edema or consolidation. No pneumothorax.    2D echo -  estimated ejection fraction was in the range of 65% to 70%   PHYSICAL EXAM  General - Well nourished, well developed elderly Caucasian lady, intubated   Ophthalmologic - fundi not visualized due to incorporation.  Cardiovascular - Regular rate and rhythm with no murmur.  Neuro - intubated and off sedation, . Not opening eyes on voice  But does so partially with stimulationand not following commands. Left head dressing dry   PERRL, pupil 3mm reactive to light, doll's eye present, corneal reflex and gag and cough reflex weakly positive. Breathing over the vent. Spontaneously moving LUE and LLE, but 0/5 RUE on pain and trace withdraw on RLE on pain stimulation, no babinski bilaterally. Reflex 1+.    ASSESSMENT/PLAN [name] is a 69 y.o. female with history of hypertension presenting with right arm weakness, severe headache, and subsequent unconsciousness.  She did not receive IV t-PA due to a large left frontal parietal intracerebral hemorrhage.  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   Resultant  Intubated and right hemiplegia  Slow to wake up  Repeat CT showed smaller hematoma and decreased midline shift2SCDs for VTE prophylaxis, will start  lovenox for VTE prophylaxis   NPO  aspirin 81 mg orally every day and clopidogrel 75 mg orally every day prior to admission, now on no antithrombotic due to hemorrhage  Ongoing aggressive stroke risk factor management  Check EEG  Therapy recommendations: Pending  Disposition: Pending  Cerebral edema  Midline shift improved s/p hematoma evacuation  Repeat CT no evidence of hydro   Will hold off hypertonic saline.   Neuro check  Hypertension  Home meds: Toprol-XL  Consider adding on anti hypertensives to titrate down clevedipine.   Unstable  Put on po BP meds and  off drip  BP goal < 180  Close monitoring  On fentanyl.  Bradycardia and pauses at night. On dopamine  gtt. Now off Cardiology feel it is vagally mediated   Hyperlipidemia  LDL 132  Other Stroke Risk Factors  Advanced age  Obesity, Body mass index is 38.01 kg/(m^2).   Other Active Problems  Intubated  Hyperkalemia  hyperglycemia  Mild anemia  Other Pertinent History  History of abdominal aortic aneurysm repair  Hospital day # 5 I have personally examined this patient, reviewed notes, independently viewed imaging studies, participated in medical decision making and plan of care. I have made any additions or clarifications directly to the above note. The patient's mental status remains depressed with aphasia and dense right hemiplegia despite hematoma evacuation and decreasing hemorrhage size and edema. Plan repeat head CT in am and add free water due to increasing sodium and maintain strict control of hypertension. I had a long discussion of the bedside with the patient's daughter regarding her neurological condition, prognosis and answered questions. Hopefully she begins to wake up the next few days otherwise are need to consider long-term ventilatory support. The daughter will discuss with the patient's son and make a decision of the next few days whether they want to pursue This patient is  critically ill and at significant risk of neurological worsening, death and care requires constant monitoring of vital signs, hemodynamics,respiratory and cardiac monitoring,review of multiple databases, neurological assessment, discussion with family, other specialists and medical decision making of high complexity.I have made any additions or clarifications directly to the above note.  I spent 35 minutes of neurocritical care time  in the care of  this patient.  Delia Heady, MD Medical Director [facility] Pager: [phone] 05/25/2014 10:34 AM   To contact Stroke Continuity provider, please refer to [email or web address]. After hours, contact General Neurology

## 2014-05-25 NOTE — Progress Notes (Signed)
eLink Physician-Brief Progress Note Patient Name: Carolyn PhilipsGeraldine [name] DOB: [birth year] MRN: [id number]   Date of Service  05/25/2014  HPI/Events of Note  Harm to self  eICU Interventions  Restraint needed     Intervention Category Minor Interventions: Routine modifications to care plan (e.g. PRN medications for pain, fever)  Nelda BucksFEINSTEIN,Nakiyah Beverley J. 05/25/2014, 9:27 PM

## 2014-05-25 NOTE — Progress Notes (Signed)
PULMONARY  / CRITICAL CARE MEDICINE  Name: Carolyn [name] MRN: [id number] DOB: 12/22/1945    LOS: 5  REFERRING MD :  Dr. Roseanne Reno  CHIEF COMPLAINT:  ICH  BRIEF PATIENT DESCRIPTION:  68 F with PMH of htn and recent stent of AAA performed @ [facility] presented to the ED 4/28 with L hemispheric hemorrhagic CVA. Intubated in ED due to depressed LOC and admitted to [facility] service with Stroke and NS consultants. 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    MAJOR EVENTS/TEST RESULTS: 4/28 admitted as above to [facility] service. Stroke team consultation. NS consultation 4/28 CT head: Large left frontal parietal intracerebral hemorrhage with a 7.5 mm of midline shift. The hemorrhage has extended into the left lateral ventricle 4/28 Procedure: Left frontoparietal craniotomy for evacuation of intracerebral hematoma. Surgeon: Gerlene Fee 4/29 CT head: Interval decompression of the left-sided cerebral hemorrhage, with subdural drain in place along the craniotomy site. The hematoma is significantly reduced in volume. There is gas in the center of the hematoma and a small amount of gas and blood products in the extra-axial space on the left. Intraventricular hemorrhage in both lateral ventricles and in the third and fourth ventricle, without hydrocephalus. Faintly increased density along the right temporal lobe is compatible with a small amount of new subarachnoid hemorrhage along the right temporal lobe. 3 mm of left-to-right midline shift 4/29 Remains intubated, sedated, on nicardipine, propofol 4/29 TTE: nml LV fn 4/30 CT head: "improved" 5/1 head CT (brady) >> no change  INDWELLING DEVICES:: ETT 4/28 >>  R radial A-line 4/28 >>  PICC (ordered) 4/29 >>   MICRO DATA: MRSA PCR 4/28 >> POS  ANTIMICROBIALS:   None  INTERVAL HISTORY:  Afebrile A;line BP reading 20 points higher than cuff  VITAL SIGNS: Temp:  [98.5 F (36.9 C)-99.8 F (37.7 C)] 99.4 F (37.4 C) (05/03 0749) Pulse  Rate:  [51-93] 69 (05/03 1000) Resp:  [11-19] 14 (05/03 1000) BP: (134-184)/(42-75) 166/49 mmHg (05/03 1000) SpO2:  [91 %-100 %] 99 % (05/03 1000) FiO2 (%):  [30 %] 30 % (05/03 0746) Weight:  [100.5 kg (221 lb 9 oz)] 100.5 kg (221 lb 9 oz) (05/03 0425) HEMODYNAMICS:   VENTILATOR SETTINGS: Vent Mode:  [-] CPAP;PSV FiO2 (%):  [30 %] 30 % Set Rate:  [16 bmp] 16 bmp Vt Set:  [440 mL] 440 mL PEEP:  [5 cmH20] 5 cmH20 Pressure Support:  [5 cmH20] 5 cmH20 Plateau Pressure:  [13 cmH20-14 cmH20] 13 cmH20   INTAKE / OUTPUT: Intake/Output      05/02 0701 - 05/03 0700 05/03 0701 - 05/04 0700   I.V. (mL/kg) 245 (2.4) 30 (0.3)   NG/GT 1355 215   IV Piggyback 210    Total Intake(mL/kg) 1810 (18) 245 (2.4)   Urine (mL/kg/hr) 2550 (1.1) 530 (1.6)   Stool  0 (0)   Total Output 2550 530   Net -740 -285        Stool Occurrence  1 x    PHYSICAL EXAMINATION: General:  Acutely ill, intuabted Neuro: RASS 0, R hemiplegia improving, left side normal- follows some commands HEENT: Surgical dressing in place Cardiovascular:  Reg, no M Lungs: clear anteriorly Abdomen:  Obese, soft, +BS Ext: warm, no edema  LABS: Cbc  Recent Labs Lab 05/23/14 0350 05/24/14 0400 05/25/14 0530  WBC 13.5* 11.8* 12.0*  HGB 9.6* 10.1* 10.8*  HCT 31.7* 32.9* 36.5  PLT 279 268 284   Chemistry  Recent Labs Lab 05/23/14 0350 05/24/14 0400 05/25/14 0530  NA 143 146* 146*  K 4.0 3.7 3.7  CL 109 108 106  CO2 BUN CREATININE 0.56 0.52 0.52  CALCIUM 8.8* 8.6* 9.1  MG 2.1 2.0  --   PHOS 2.7 3.1  --   GLUCOSE 150* 133* 136*   Liver fxn  Recent Labs Lab 05/20/14 1343 05/21/14 0400  AST 28 73*  ALT 29 34  ALKPHOS 125* 108  BILITOT 0.6 1.1  PROT 6.7 6.6  ALBUMIN 3.9 3.6   coags  Recent Labs Lab 05/20/14 1343 05/21/14 0400  APTT 30  --   INR 0.91 0.95   Sepsis markers No results for input(s): LATICACIDVEN, PROCALCITON in the last 168 hours. Cardiac markers No results for  input(s): CKTOTAL, CKMB, TROPONINI in the last 168 hours. BNP No results for input(s): PROBNP in the last 168 hours. ABG  Recent Labs Lab 05/20/14 1356 05/20/14 2155 05/23/14 0408  PHART  --  7.368 7.397  PCO2ART  --  41.7 45.8*  PO2ART  --  74.9* 85.3  HCO3  --  23.7 27.6*  TCO2 24 25.0 29.0   CBG trend  Recent Labs Lab 05/24/14 1559 05/24/14 2001 05/24/14 2359 05/25/14 0406 05/25/14 0743  GLUCAP 118* 135* 116* 137* 121*   CXR: bibasilar atx  ASSESSMENT / PLAN:  PULMONARY ASSESSMENT: Acute respiratory failure due to CVA, AMS PLAN:   Will begin PS trials but no extubation given mental status. Cont vent bundle Titrate O2 for sat of 88-92% Will likely need a trach/peg, will await prognostication from neuro, currently can not protect her airway.  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 Cardiology consult called. Hold all anti-HTN. SBP goal < 140 mmHg  RENAL  ASSESSMENT:  Hypokalemia, resolved Hyperkalemia, resolved Hypernatremia. PLAN:   Monitor BMET intermittently Monitor I/Os Correct electrolytes as indicated Add free water for hypernatremia.  GASTROINTESTINAL  ASSESSMENT:  No acute issues PLAN:   SUP: enteral PPI Continue TFs  Add free water.  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 Avoid Precedex  - caused significant bradycardia and hypotension. Daily WUA Post op mgmt per NS Further stroke eval per Stroke team  The patient is critically ill with multiple organ systems failure and requires high complexity decision making for assessment and support, frequent evaluation and titration of therapies, application of advanced monitoring technologies and extensive interpretation of multiple databases.   Critical Care Time devoted to patient care services described in this note is  35  Minutes. This time reflects time of care of this signee Dr Koren Bound. This critical care time does not reflect procedure time, or teaching time or supervisory time of PA/NP/Med student/Med Resident etc but could involve care discussion time.  Alyson Reedy,  M.D. [facility]. Pager: [phone]. After hours pager: [phone].

## 2014-05-25 NOTE — Progress Notes (Signed)
Patient ID: Carolyn [name], female   DOB: [birth year], 69 y.o.   MRN: [id number]    Subjective:  Intubated   Objective:  Filed Vitals:   05/25/14 1000 05/25/14 1100 05/25/14 1126 05/25/14 1200  BP: 166/49  144/45 155/53  Pulse: 69  71 73  Temp:  99.5 F (37.5 C)    TempSrc:  Axillary    Resp: 14   16  Height:      Weight:      SpO2: 99%  100% 100%    Intake/Output from previous day:  Intake/Output Summary (Last 24 hours) at 05/25/14 1247 Last data filed at 05/25/14 1200  Gross per 24 hour  Intake   2130 ml  Output   2755 ml  Net   -625 ml    Physical Exam: Blood pressure 120/40, pulse 62, temperature 98.7 F (37.1 C), temperature source Axillary, resp. rate 14, height  (1.626 m), weight 220 lb 0.3 oz (99.8 kg), SpO2 98 %.  General: intubated, sedated. Psych: intubated, sedated. Neuro: intubated, sedated. HEENT: left scalp surgical site clean/dry. Neck: Supple without bruits or JVD. Lungs: Resp regular and unlabored, CTA. Heart: RRR no s3, s4, or murmurs. Abdomen: Soft, non-tender, non-distended, BS + x 4.  Extremities: No clubbing, cyanosis or edema. DP/PT/Radials 2+ and equal bilaterally.  Lab Results: Basic Metabolic Panel:  Recent Labs  40/98/11 0350 05/24/14 0400 05/25/14 0530  NA 143 146* 146*  K 4.0 3.7 3.7  CL 109 108 106  CO2 GLUCOSE 150* 133* 136*  BUN CREATININE 0.56 0.52 0.52  CALCIUM 8.8* 8.6* 9.1  MG 2.1 2.0  --   PHOS 2.7 3.1  --    CBC:  Recent Labs  05/24/14 0400 05/25/14 0530  WBC 11.8* 12.0*  HGB 10.1* 10.8*  HCT 32.9* 36.5  MCV 88.7 88.4  PLT 268 284   Fasting Lipid Panel:  Recent Labs  05/24/14 0400  TRIG 181*   Thyroid Function Tests: No results for input(s): TSH, T4TOTAL, T3FREE, THYROIDAB in the last 72 hours.  Invalid input(s): FREET3 Anemia Panel: No results for input(s): VITAMINB12, FOLATE, FERRITIN, TIBC, IRON, RETICCTPCT in the last 72 hours.  Imaging: Ct Head Wo  Contrast  05/23/2014   CLINICAL DATA:  Followup postoperative hemorrhage, bradycardia hypertension  EXAM: CT HEAD WITHOUT CONTRAST  TECHNIQUE: Contiguous axial images were obtained from the base of the skull through the vertex without intravenous contrast.  COMPARISON:  05/22/1958  FINDINGS: Stable acute hemorrhage mixed with air near the left vertex measuring about 62 x 32 mm. Stable surrounding white matter edema. Hemorrhage layers dependently within the ventricles, with no change in the appearance of the ventricles and no evidence of hydrocephalus. Increased acute hemorrhage in the fourth ventricle.  Focal transcortical low-attenuation left occipital lobe measures 25 x 24 mm. This is new when compared to the prior study.  Findings of left craniotomy again identified unchanged. Minimal midline shift from left-to-right of about 3-4 mm stable.  IMPRESSION: No significant change in the volume of intracranial hemorrhage or intraventricular hemorrhage. No hydrocephalus.  New transcortical focus of low-attenuation left occipital lobe, not identified on 05/22/2014 study. This is concerning for developing infarction.   Electronically Signed   By: Esperanza Heir M.D.   On: 05/23/2014 18:08   Dg Chest Port 1 View  05/25/2014   CLINICAL DATA:  Respiratory failure.  EXAM: PORTABLE CHEST - 1 VIEW  COMPARISON:  05/24/2014 .  FINDINGS: Endotracheal tube, NG  tube, left PICC line in stable position. Mediastinum hilar structures stable. Heart size stable. Interim partial clearing of bibasilar atelectasis. Interim clearing of left pleural effusion.  IMPRESSION: 1. Lines and tubes in stable position. 2. Interim partial clearing of bibasilar atelectasis and interim clearing of left pleural effusion.   Electronically Signed   By: Maisie Fushomas  Register   On: 05/25/2014 07:23   Dg Chest Port 1 View  05/24/2014   CLINICAL DATA:  [age] year old female with intracranial hemorrhage status post craniectomy and evacuation. Initial encounter.   EXAM: PORTABLE CHEST - 1 VIEW  COMPARISON:  05/23/2014 and earlier.  FINDINGS: Portable AP semi upright view at 0526 hrs. Endotracheal tube tip in good position projecting between the level the clavicles and carina. Enteric tube in place, tip not included. Left side PICC line re- identified.  The patient is now rotated to the right. Lower lung volumes. Stable cardiac size and mediastinal contours. No pneumothorax. Small left pleural effusion suspected with stable retrocardiac opacity. No areas of worsening ventilation.  IMPRESSION: 1.  Stable lines and tubes. 2. Lower lung volumes. Left pleural effusion and lung base atelectasis not significantly changed.   Electronically Signed   By: Odessa FlemingH  Hall M.D.   On: 05/24/2014 07:28    Cardiac Studies:  ECG:   SR low atrial foci no AV block   Telemetry:  NSR rate 80's    Echo: 05/21/14  Hyperdynamic LV function EF 65-70% no valve disease   Medications:   . antiseptic oral rinse  7 mL Mouth Rinse QID  . chlorhexidine  15 mL Mouth Rinse BID  . feeding supplement (VITAL HIGH PROTEIN)  1,000 mL Per Tube Q24H  . free water  250 mL Per Tube 4 times per day  . heparin subcutaneous  5,000 Units Subcutaneous 3 times per day  . insulin aspart  0-15 Units Subcutaneous 6 times per day  . levETIRAcetam  500 mg Intravenous Q12H  . levothyroxine  75 mcg Oral QAC breakfast  . mupirocin ointment  1 application Nasal BID  . pantoprazole sodium  40 mg Per Tube Q1200  . senna-docusate  1 tablet Per Tube BID  . sodium chloride  10-40 mL Intracatheter Q12H     . sodium chloride 10 mL/hr at 05/25/14 1200  . DOPamine Stopped (05/24/14 0730)  . fentaNYL infusion INTRAVENOUS Stopped (05/24/14 0930)    Assessment/Plan:  Bradycardia:  Likely vagally mediated from CNS event Off dompamine now and NSR rates in 80's  Cardiac status is fine no AV  Block.  Do not think this will be a significant issue for patient and no need for pacing or further cardiac w/u  Charlton HawsPeter  Shatika Grinnell 05/25/2014, 12:47 PM

## 2014-05-25 NOTE — Progress Notes (Signed)
PT Cancellation Note  Patient Details Name: Carolyn CourtGeraldine [name] MRN: [id number] DOB: [birth year]   Cancelled Treatment:    Reason Eval/Treat Not Completed: Patient not medically ready.   Pt sedated on vent. 05/25/2014  Bonanza BingKen Brace Welte, PT [phone]  (pager)   Maricella Filyaw, Eliseo GumKenneth V 05/25/2014, 4:13 PM

## 2014-05-26 ENCOUNTER — Inpatient Hospital Stay (HOSPITAL_COMMUNITY): Payer: BLUE CROSS/BLUE SHIELD

## 2014-05-26 ENCOUNTER — Encounter (HOSPITAL_COMMUNITY): Payer: Self-pay | Admitting: Radiology

## 2014-05-26 DIAGNOSIS — J9600 Acute respiratory failure, unspecified whether with hypoxia or hypercapnia: Secondary | ICD-10-CM | POA: Insufficient documentation

## 2014-05-26 DIAGNOSIS — I6340 Cerebral infarction due to embolism of unspecified cerebral artery: Secondary | ICD-10-CM

## 2014-05-26 LAB — BASIC METABOLIC PANEL
Anion gap: 10 (ref 5–15)
BUN: 19 mg/dL (ref 6–20)
CALCIUM: 9.1 mg/dL (ref 8.9–10.3)
CO2: 29 mmol/L (ref 22–32)
Chloride: 105 mmol/L (ref 101–111)
Creatinine, Ser: 0.55 mg/dL (ref 0.44–1.00)
GFR calc non Af Amer: >60 mL/min (ref >60)
GLUCOSE: 115 mg/dL — AB (ref 70–99)
POTASSIUM: 3.5 mmol/L (ref 3.5–5.1)
SODIUM: 144 mmol/L (ref 135–145)

## 2014-05-26 LAB — POCT I-STAT 3, ART BLOOD GAS (G3+)
Acid-Base Excess: 4 mmol/L — ABNORMAL HIGH (ref 0.0–2.0)
Bicarbonate: 28.6 mEq/L — ABNORMAL HIGH (ref 20.0–24.0)
O2 Saturation: 94 %
TCO2: 30 mmol/L (ref 0–100)
pCO2 arterial: 45.2 mmHg — ABNORMAL HIGH (ref 35.0–45.0)
pH, Arterial: 7.412 (ref 7.350–7.450)
pO2, Arterial: 72 mmHg — ABNORMAL LOW (ref 80.0–100.0)

## 2014-05-26 LAB — CBC
HCT: 35 % — ABNORMAL LOW (ref 36.0–46.0)
Hemoglobin: 10.5 g/dL — ABNORMAL LOW (ref 12.0–15.0)
MCH: 26.5 pg (ref 26.0–34.0)
MCHC: 30 g/dL (ref 30.0–36.0)
MCV: 88.4 fL (ref 78.0–100.0)
PLATELETS: 271 10*3/uL (ref 150–400)
RBC: 3.96 MIL/uL (ref 3.87–5.11)
RDW: 15.6 % — ABNORMAL HIGH (ref 11.5–15.5)
WBC: 12.2 10*3/uL — ABNORMAL HIGH (ref 4.0–10.5)

## 2014-05-26 LAB — MAGNESIUM: Magnesium: 2.2 mg/dL (ref 1.7–2.4)

## 2014-05-26 LAB — GLUCOSE, CAPILLARY
GLUCOSE-CAPILLARY: 121 mg/dL — AB (ref 70–99)
GLUCOSE-CAPILLARY: 147 mg/dL — AB (ref 70–99)
Glucose-Capillary: 99 mg/dL (ref 70–99)
Glucose-Capillary: 93 mg/dL (ref 70–99)

## 2014-05-26 LAB — PHOSPHORUS: Phosphorus: 4.4 mg/dL (ref 2.5–4.6)

## 2014-05-26 MED ORDER — GADOBENATE DIMEGLUMINE 529 MG/ML IV SOLN
20.0000 mL | Freq: Once | INTRAVENOUS | Status: AC | PRN
  Administered 2014-05-26: 20 mL via INTRAVENOUS

## 2014-05-26 MED ORDER — POTASSIUM CHLORIDE 20 MEQ/15ML (10%) PO SOLN
20.0000 meq | ORAL | Status: AC
  Administered 2014-05-26 (×2): 20 meq via ORAL
  Filled 2014-05-26 (×2): qty 15

## 2014-05-26 MED ORDER — SENNOSIDES 8.8 MG/5ML PO SYRP
5.0000 mL | ORAL_SOLUTION | Freq: Two times a day (BID) | ORAL | Status: DC
  Administered 2014-05-26 – 2014-05-30 (×9): 5 mL via ORAL
  Filled 2014-05-26 (×10): qty 5

## 2014-05-26 MED ORDER — MIDAZOLAM HCL 5 MG/ML IJ SOLN: 2.0000 mg | INTRAMUSCULAR | Status: DC | PRN

## 2014-05-26 MED ORDER — MIDAZOLAM HCL 2 MG/2ML IJ SOLN
2.0000 mg | INTRAMUSCULAR | Status: DC | PRN
  Filled 2014-05-26: qty 4

## 2014-05-26 MED ORDER — POTASSIUM CHLORIDE CRYS ER 20 MEQ PO TBCR: 20.0000 meq | EXTENDED_RELEASE_TABLET | ORAL | Status: DC

## 2014-05-26 MED ORDER — DOCUSATE SODIUM 50 MG/5ML PO LIQD
100.0000 mg | Freq: Two times a day (BID) | ORAL | Status: DC
  Administered 2014-05-26 – 2014-05-30 (×9): 100 mg via ORAL
  Filled 2014-05-26 (×10): qty 10

## 2014-05-26 NOTE — Progress Notes (Signed)
eLink Physician-Brief Progress Note Patient Name: [name] CourtGeraldine Schaben DOB: 24-Oct-1945 MRN: [id number]   Date of Service  05/26/2014  HPI/Events of Note  Harm to self  eICU Interventions  restr renew     Intervention Category Minor Interventions: Agitation / anxiety - evaluation and management  Nelda BucksFEINSTEIN,DANIEL J. 05/26/2014, 8:00 PM

## 2014-05-26 NOTE — Progress Notes (Signed)
STROKE TEAM PROGRESS NOTE   HISTORY Carolyn [name] is a 69 y.o. female brought to hospital as code stroke. EMS was called to her work and told she was noted to have right arm weakness then noted to complain of severe HA which then was followed by sudden unconsciousness. LKW 05/20/2014 at 1240. On arrival she was unconscious on NRB but not protectin g airway well. CT head was obtained and showed a large left parietal ICH. While in ED patient has one episode emesis and intubated for air way protection. NIH stroke score was 31. Blood pressure was markedly elevated, and at one point was 241/123. Emergency management of hypertension was undertaken with use of Cardene intravenously. She was admitted to the neuro ICU for further evaluation and treatment.   SUBJECTIVE (INTERVAL HISTORY)  She is more arousable today and tries to open eyes but remains aphasic and not following commands and dense Rt hemiplegia. Blood pressure adequately controlled. Daughter at bedside. Long 20 min discussion with son [name] over the phone about prognosis and plan of care  OBJECTIVE Temp:  [99 F (37.2 C)-99.6 F (37.6 C)] 99 F (37.2 C) (05/04 0742) Pulse Rate:  [56-101] 66 (05/04 1305) Cardiac Rhythm:  [-] Normal sinus rhythm (05/04 0800) Resp:  [12-22] 14 (05/04 1305) BP: (126-170)/(40-70) 144/56 mmHg (05/04 1305) SpO2:  [96 %-100 %] 100 % (05/04 1305) FiO2 (%):  [30 %-100 %] 100 % (05/04 1215) Weight:  [212 lb 15.4 oz (96.6 kg)] 212 lb 15.4 oz (96.6 kg) (05/04 0333)   Recent Labs Lab 05/25/14 0743 05/25/14 1133 05/25/14 1552 05/25/14 1958 05/26/14 0010  GLUCAP 121* 127* 132* 124* 121*    Recent Labs Lab 05/22/14 0515 05/23/14 0350 05/24/14 0400 05/25/14 0530 05/26/14 0330  NA 139 143 146* 146* 144  K 3.4* 4.0 3.7 3.7 3.5  CL 103 109 108 106 105  CO2 28 27 30 28 29   GLUCOSE 142* 150* 133* 136* 115*  BUN 12 19 19 19 19   CREATININE 0.72 0.56 0.52 0.52 0.55  CALCIUM 8.9 8.8* 8.6* 9.1 9.1  MG   --  2.1 2.0  --  2.2  PHOS  --  2.7 3.1  --  4.4    Recent Labs Lab 05/20/14 1343 05/21/14 0400  AST 28 73*  ALT 29 34  ALKPHOS 125* 108  BILITOT 0.6 1.1  PROT 6.7 6.6  ALBUMIN 3.9 3.6    Recent Labs Lab 05/20/14 1343  05/22/14 0515 05/23/14 0350 05/24/14 0400 05/25/14 0530 05/26/14 0330  WBC 9.7  < > 15.5* 13.5* 11.8* 12.0* 12.2*  NEUTROABS 6.3  --   --   --   --   --   --   HGB 12.7  < > 10.0* 9.6* 10.1* 10.8* 10.5*  HCT 40.0  < > 32.0* 31.7* 32.9* 36.5 35.0*  MCV 85.5  < > 86.3 87.3 88.7 88.4 88.4  PLT 234  < > 275 279 268 284 271  < > = values in this interval not displayed. No results for input(s): CKTOTAL, CKMB, CKMBINDEX, TROPONINI in the last 168 hours. No results for input(s): LABPROT, INR in the last 72 hours. No results for input(s): COLORURINE, LABSPEC, PHURINE, GLUCOSEU, HGBUR, BILIRUBINUR, KETONESUR, PROTEINUR, UROBILINOGEN, NITRITE, LEUKOCYTESUR in the last 72 hours.  Invalid input(s): APPERANCEUR     Component Value Date/Time   CHOL 242* 05/21/2014 1048   TRIG 181* 05/24/2014 0400   HDL 61 05/21/2014 1048   CHOLHDL 4.0 05/21/2014 1048   VLDL 49*  05/21/2014 1048   LDLCALC 132* 05/21/2014 1048   Lab Results  Component Value Date   HGBA1C 5.9* 05/21/2014      Component Value Date/Time   LABOPIA NONE DETECTED 05/20/2014 1513   COCAINSCRNUR NONE DETECTED 05/20/2014 1513   LABBENZ NONE DETECTED 05/20/2014 1513   AMPHETMU NONE DETECTED 05/20/2014 1513   THCU NONE DETECTED 05/20/2014 1513   LABBARB NONE DETECTED 05/20/2014 1513     Recent Labs Lab 05/20/14 1343  ETH <5    Ct Head Wo Contrast  05/21/2014   IMPRESSION: 1. Interval decompression of the left-sided cerebral hemorrhage, with subdural drain in place along the craniotomy site. The hematoma is significantly reduced in volume. There is gas in the center of the hematoma and a small amount of gas and blood products in the extra-axial space on the left. 2. Intraventricular hemorrhage in  both lateral ventricles and in the third and fourth ventricle, without hydrocephalus. 3. Faintly increased density along the right temporal lobe is compatible with a small amount of new subarachnoid hemorrhage along the right temporal lobe. 4. 3 mm of left-to-right midline shift.     05/20/2014   IMPRESSION: Large left frontal parietal intracerebral hemorrhage with a 7.5 mm of midline shift. The hemorrhage has extended into the left lateral ventricle.    Dg Chest Port 1 View  05/21/2014    IMPRESSION: NG tube placed.  Stable bibasilar atelectasis.  No evidence of pulmonary edema.     05/20/2014   IMPRESSION: Endotracheal tube tip at the carina with the tip directed toward the right main bronchus. No edema or consolidation. No pneumothorax.    2D echo -  estimated ejection fraction was in the range of 65% to 70%   PHYSICAL EXAM  General - Well nourished, well developed elderly Caucasian lady, intubated   Ophthalmologic - fundi not visualized due to incorporation.  Cardiovascular - Regular rate and rhythm with no murmur.  Neuro - intubated and off sedation, .  opening eyes partially on voice  And with stimulation and not following commands.    PERRL, pupil 3mm reactive to light, doll's eye present, corneal reflex and gag and cough reflex weakly positive. Breathing over the vent. Spontaneously moving LUE and LLE, but 0/5 RUE on pain and trace withdraw on RLE on pain stimulation, no babinski bilaterally. Reflex 1+.    ASSESSMENT/PLAN [name] is a 69 y.o. female with history of hypertension presenting with right arm weakness, severe headache, and subsequent unconsciousness.  She did not receive IV t-PA due to a large left frontal parietal intracerebral hemorrhage.  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  Repeat CT scans show new interval left parieto-occipital and basal ganglia infarcts raising concern for the initial hematoma being hemorrhagic  transformation into a embolic stroke  Resultant  Intubated and right hemiplegia  Slow to wake up  Repeat CT showed smaller hematoma and decreased midline shift2SCDs for VTE prophylaxis, will start lovenox for VTE prophylaxis   NPO  aspirin 81 mg orally every day and clopidogrel 75 mg orally every day prior to admission, now on no antithrombotic due to hemorrhage  Ongoing aggressive stroke risk factor management    EEG 05/24/14 Abnormal EEG due to generalized slowing indicating a mild to moderate cerebral disturbance (encephalopathy). Focal low voltage slowing is noted in the left hemisphere indicating a focal cerebral disturbance. No seizure activity noted  Therapy recommendations: Pending  Disposition: Pending  Cerebral edema  Midline shift improved s/p hematoma evacuation  Repeat CT no evidence of hydro   Will hold off hypertonic saline.   Neuro check  Hypertension  Home meds: Toprol-XL  Consider adding on anti hypertensives to titrate down clevedipine.   Unstable  Put on po BP meds and  off drip  BP goal < 180  Close monitoring  On fentanyl.  Bradycardia and pauses at night. On dopamine gtt. Now off Cardiology feel it is vagally mediated   Hyperlipidemia  LDL 132  Other Stroke Risk Factors  Advanced age  Obesity, Body mass index is 36.54 kg/(m^2).   Other Active Problems  Intubated  Hyperkalemia  hyperglycemia  Mild anemia  Other Pertinent History  History of abdominal aortic aneurysm repair  Hospital day # 6 I have personally examined this patient, reviewed notes, independently viewed imaging studies, participated in medical decision making and plan of care. I have made any additions or clarifications directly to the above note. The patient's mental status remains depressed with aphasia and dense right hemiplegia despite hematoma evacuation and decreasing hemorrhage size and edema. Plan repeat head CT shows a new left basal ganglia infarct in  addition to the left parieto-occipital infarct and the evacuated left frontal hematoma. B will need to check MRI scan of the brain with MRA of the brain and neck to evaluate left carotid circulation and if this is unyielding may need to consider transesophageal echocardiogram to rule out cardiac source of embolism in the next few days. I had a long discussion at the bedside with the patient's daughter regarding her neurological condition, prognosis and answered questions. Hopefully she continues to wake up  In the next few days and be able to protect her airway otherwise are need to consider long-term ventilatory support. The daughter will discuss with the patient's son and make a decision of the next few days whether they want to pursue This patient is critically ill and at significant risk of neurological worsening, death and care requires constant monitoring of vital signs, hemodynamics,respiratory and cardiac monitoring,review of multiple databases, neurological assessment, discussion with family, other specialists and medical decision making of high complexity.I have made any additions or clarifications directly to the above note.  I spent 45 minutes of neurocritical care time  in the care of  this patient.  Delia HeadyPramod Sethi, MD Medical Director [facility] Pager: [phone] 05/26/2014 2:45 PM   To contact Stroke Continuity provider, please refer to [email or web address]. After hours, contact General Neurology

## 2014-05-26 NOTE — Progress Notes (Signed)
OT Cancellation and discharge Note  Patient Details Name: Carolyn [name] MRN: [id number] DOB: 1945/06/14   Cancelled Treatment:    Reason Eval/Treat Not Completed: Patient not medically ready.  Pt continues to have medical issues that prevent initiation of OT services.  At this time, OT will sign off.  Please re-order OT when pt medically appropriate and able to participate.  Thank you.  Angelene GiovanniConarpe, Algis Lehenbauer M  [place], OTR/L [phone]  05/26/2014, 1:57 PM

## 2014-05-26 NOTE — Progress Notes (Signed)
PT Cancellation Note/  Discharge  Patient Details Name: Carolyn CourtGeraldine Sorg MRN: [id number] DOB: 1945-05-05   Cancelled Treatment:    Reason Eval/Treat Not Completed: Other (comment);Medical issues which prohibited therapy (Signing off due to pt has not been ready for 5 days).  Await new orders. 05/26/2014  Chewey BingKen Rollande Thursby, PT [phone]  (pager)   Perri Aragones, Eliseo GumKenneth V 05/26/2014, 1:52 PM

## 2014-05-26 NOTE — Progress Notes (Signed)
PULMONARY  / CRITICAL CARE MEDICINE  Name: Carolyn [name] MRN: [id number] DOB: 11-Mar-1945    LOS: 6  REFERRING MD :  Dr. Roseanne RenoStewart  CHIEF COMPLAINT:  ICH  BRIEF PATIENT DESCRIPTION:  [age] F with PMH of htn and [facility] presented to the ED 4/28 with L hemispheric hemorrhagic CVA. Intubated in ED due to depressed LOC [facility] and NS consultants. 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      INDWELLING DEVICES:: ETT 4/28 >>  R radial A-line 4/28 >>  PICC (ordered) 4/29 >>   MICRO DATA: MRSA PCR 4/28 >> POS  ANTIMICROBIALS:   None   MAJOR EVENTS/TEST RESULTS: [facility] Stroke team consultation. NS consultation 4/28 CT head: Large left frontal parietal intracerebral hemorrhage with a 7.5 mm of midline shift. The hemorrhage has extended into the left lateral ventricle 4/28 Procedure: Left frontoparietal craniotomy for evacuation of intracerebral hematoma. Surgeon: [name] 4/29 CT head: Interval decompression of the left-sided cerebral hemorrhage, with subdural drain in place along the craniotomy site. The hematoma is significantly reduced in volume. There is gas in the center of the hematoma and a small amount of gas and blood products in the extra-axial space on the left. Intraventricular hemorrhage in both lateral ventricles and in the third and fourth ventricle, without hydrocephalus. Faintly increased density along the right temporal lobe is compatible with a small amount of new subarachnoid hemorrhage along the right temporal lobe. 3 mm of left-to-right midline shift 4/29 Remains intubated, sedated, on nicardipine, propofol 4/29 TTE: nml LV fn 4/30 CT head: "improved" 5/1 head CT (brady) >> no change 05/25/14: Afebrile. A-line BP reading 20 points higher than cuff    SUBJECTIVE/OVERNIGHT/INTERVAL HX 05/26/14: Per neuro - patient a bit more awake but not ready for  extubation.Neuro has ordered MRI.   VITAL SIGNS: Temp:  [99 F (37.2 C)-99.6 F (37.6 C)] 99 F (37.2 C) (05/04 0742) Pulse Rate:  [56-101] 72 (05/04 0900) Resp:  [12-22] 14 (05/04 0900) BP: (126-176)/(40-83) 162/54 mmHg (05/04 0900) SpO2:  [96 %-100 %] 99 % (05/04 0900) FiO2 (%):  [30 %-40 %] 40 % (05/04 0900) Weight:  [96.6 kg (212 lb 15.4 oz)] 96.6 kg (212 lb 15.4 oz) (05/04 0333) HEMODYNAMICS:   VENTILATOR SETTINGS: Vent Mode:  [-] PSV;CPAP FiO2 (%):  [30 %-40 %] 40 % Set Rate:  [16 bmp] 16 bmp Vt Set:  [440 mL] 440 mL PEEP:  [5 cmH20] 5 cmH20 Pressure Support:  [5 cmH20] 5 cmH20 Plateau Pressure:  [8 cmH20-17 cmH20] 14 cmH20   INTAKE / OUTPUT: Intake/Output      05/03 0701 - 05/04 0700 05/04 0701 - 05/05 0700   I.V. (mL/kg) 250 (2.6) 40 (0.4)   NG/GT 2193.3 110   IV Piggyback 210 105   Total Intake(mL/kg) 2653.3 (27.5) 255 (2.6)   Urine (mL/kg/hr) 1990 (0.9) 450 (1.5)   Stool 0 (0)    Total Output 1990 450   Net +663.3 -195        Stool Occurrence 3 x     PHYSICAL EXAMINATION: General:  Acutely ill, intuabted Neuro: RASS -2, R hemiplegia improving, left side normal- follows some commands HEENT: Surgical dressing in place Cardiovascular:  Reg, no M Lungs: clear anteriorly Abdomen:  Obese, soft, +BS Ext: warm, no edema  LABS:  PULMONARY  Recent Labs Lab 05/20/14 1356 05/20/14 2155 05/23/14 0408 05/26/14 0320  PHART  --  7.368 7.397 7.412  PCO2ART  --  41.7 45.8* 45.2*  PO2ART  --  74.9* 85.3 72.0*  HCO3  --  23.7 27.6* 28.6*  TCO2 24 25.0 29.0 30  O2SAT  --  94.2 96.4 94.0    CBC  Recent Labs Lab 05/24/14 0400 05/25/14 0530 05/26/14 0330  HGB 10.1* 10.8* 10.5*  HCT 32.9* 36.5 35.0*  WBC 11.8* 12.0* 12.2*  PLT 268 284 271    COAGULATION  Recent Labs Lab 05/20/14 1343 05/21/14 0400  INR 0.91 0.95    CARDIAC  No results for input(s): TROPONINI in the last 168 hours. No results for input(s): PROBNP in the last 168  hours.   CHEMISTRY  Recent Labs Lab 05/22/14 0515 05/23/14 0350 05/24/14 0400 05/25/14 0530 05/26/14 0330  NA 139 143 146* 146* 144  K 3.4* 4.0 3.7 3.7 3.5  CL 103 109 108 106 105  CO2 28 27 30 28 29   GLUCOSE 142* 150* 133* 136* 115*  BUN 12 19 19 19 19   CREATININE 0.72 0.56 0.52 0.52 0.55  CALCIUM 8.9 8.8* 8.6* 9.1 9.1  MG  --  2.1 2.0  --  2.2  PHOS  --  2.7 3.1  --  4.4   Estimated Creatinine Clearance: 76 mL/min (by C-G formula based on Cr of 0.55).   LIVER  Recent Labs Lab 05/20/14 1343 05/21/14 0400  AST 28 73*  ALT 29 34  ALKPHOS 125* 108  BILITOT 0.6 1.1  PROT 6.7 6.6  ALBUMIN 3.9 3.6  INR 0.91 0.95     INFECTIOUS No results for input(s): LATICACIDVEN, PROCALCITON in the last 168 hours.   ENDOCRINE CBG (last 3)   Recent Labs  05/25/14 1552 05/25/14 1958 05/26/14 0010  GLUCAP 132* 124* 121*         IMAGING x48h Ct Head Wo Contrast  05/26/2014   CLINICAL DATA:  Follow-up intracranial hemorrhage  EXAM: CT HEAD WITHOUT CONTRAST  TECHNIQUE: Contiguous axial images were obtained from the base of the skull through the vertex without intravenous contrast.  COMPARISON:  05/23/2014, 05/22/2014  FINDINGS: There is no interval change in volume of the intraparenchymal hemorrhage near the left vertex, measuring approximately 3 x 6.5 cm. Intraventricular hemorrhage is mildly reduced in volume. Ventricular size remains unchanged. No new intracranial hemorrhage is evident.  There is normal evolution of the left occipital infarction, becoming more hypodense. Developing hypodensity in the region of the left internal capsule genu and globus pallidus also has the appearance of evolving infarction.  Frontal craniotomy again noted.  IMPRESSION: *No significant change in volume of intraparenchymal hemorrhage. No new intracranial hemorrhage. *Decreasing volume of intraventricular hemorrhage. No change in size of the cerebral ventricles. *Recent nonhemorrhagic infarctions  in the left occipital lobe and in the left globus pallidus/internal capsule genu.   Electronically Signed   By: Ellery Plunkaniel R Mitchell M.D.   On: 05/26/2014 03:06   Dg Chest Port 1 View  05/26/2014   CLINICAL DATA:  Respiratory difficulty  EXAM: PORTABLE CHEST - 1 VIEW  COMPARISON:  Yesterday  FINDINGS: Endotracheal tube and NG tube are stable. Tip of the left arm PICC is now at the cavoatrial junction. Bibasilar hazy opacity has increased with low lung volumes. No pneumothorax.  IMPRESSION: Left upper extremity PICC tip is now at the cavoatrial junction.  Bibasilar atelectasis versus airspace disease has developed.   Electronically Signed   By: Jolaine ClickArthur  Hoss M.D.   On: 05/26/2014 07:34   [facility]  05/25/2014   CLINICAL DATA:  Respiratory failure.  EXAM: PORTABLE CHEST - 1 VIEW  COMPARISON:  05/24/2014 .  FINDINGS: Endotracheal tube, NG tube, left PICC line in stable position. Mediastinum hilar structures stable. Heart size stable. Interim partial clearing of bibasilar atelectasis. Interim clearing of left pleural effusion.  IMPRESSION: 1. Lines and tubes in stable position. 2. Interim partial clearing of bibasilar atelectasis and interim clearing of left pleural effusion.   Electronically Signed   By: Maisie Fus  Register   On: 05/25/2014 07:23         ASSESSMENT / PLAN:  PULMONARY ASSESSMENT: Acute respiratory failure due to CVA, AMS   - doing SBT but mental status precludes extubation  PLAN:   Will begin PS trials but no extubation given mental status. Cont vent bundle Titrate O2 for sat of 88-92% Will likely need a trach/peg, will await prognostication from neuro,   CARDIOVASCULAR  ASSESSMENT:  Hypertensive emergency AAA s/p recent stent grafting  Hypotensive and junctional brady with sinus pauses   - No acute issues 05/26/14. Cards 05/25/14: felt vagally mediated bradycardia  PLAN:  Dc dopamine SBP goal < 140 mmHg  RENAL  ASSESSMENT:  Nil acute   PLAN:   Monitor BMET  intermittently Monitor I/Os Correct electrolytes as indicated Adjust free water for Na levels  GASTROINTESTINAL  ASSESSMENT:  No acute issues  PLAN:   SUP: enteral PPI Continue TFs  Cont free water.  HEMATOLOGIC  ASSESSMENT:   Plavix, ASA prior to admission (indication: recent AAA stent)  Received plt transfusion in ED PLAN:  DVT px: SCDs Monitor CBC intermittently Transfuse per usual ICU guidelines  INFECTIOUS  ASSESSMENT: No evidence of infection PLAN:   Monitor temp, WBC count Micro and abx as above  ENDOCRINE  ASSESSMENT:   Hypothyroidism (L-thyroxine 75 mcg daily PTA) Hyperglycemia without prior DM history PLAN:   Cont L-thyroxine SSI  NEUROLOGIC  ASSESSMENT:   Hypertensive [facility] S/P craniotomy 4/28 Post op pain ICU associated discomfort   - Rt side weak, gag +, MRI pending 05/26/14  PLAN:   RASS goal: 0 Fentanyl  Intermittent. Add versed prn for MRI Avoid Precedex  - caused significant bradycardia and hypotension. Daily WUA Post op mgmt per NS Further stroke eval per Stroke team    GLOBA 05/26/14: daughter updated.    The patient is critically ill with multiple organ systems failure and requires high complexity decision making for assessment and support, frequent evaluation and titration of therapies, application of advanced monitoring technologies and extensive interpretation of multiple databases.   Critical Care Time devoted to patient care services described in this note is  30 Minutes. This time reflects time of care of this signee Dr Kalman Shan. This critical care time does not reflect procedure time, or teaching time or supervisory time of PA/NP/Med student/Med Resident etc but could involve care discussion time    Dr. Kalman Shan, M.D., [facility].C.P Pulmonary and Critical Care Medicine Staff Physician [facility] [place] [facility] Pager: [phone], If no answer or between  15:00h - 7:00h: call [phone]  05/26/2014 10:04 AM

## 2014-05-26 NOTE — Progress Notes (Signed)
[facility] Electrolyte Replacement Protocol  Patient Name: Carolyn [name] DOB: Jun 13, 1945 MRN: [id number]  Date of Service  05/26/2014   HPI/Events of Note    Recent Labs Lab 05/22/14 0515 05/23/14 0350 05/24/14 0400 05/25/14 0530 05/26/14 0330  NA 139 143 146* 146* 144  K 3.4* 4.0 3.7 3.7 3.5  CL 103 109 108 106 105  CO2 _0 GLUCOSE 142* 150* 133* 136* 115*  BUN _1 CREATININE 0.72 0.56 0.52 0.52 0.55  CALCIUM 8.9 8.8* 8.6* 9.1 9.1  MG  --  2.1 2.0  --  2.2  PHOS  --  2.7 3.1  --  4.4    Estimated Creatinine Clearance: 76 mL/min (by C-G formula based on Cr of 0.55).  Intake/Output      05/03 0701 - 05/04 0700   I.V. (mL/kg) 230 (2.4)   NG/GT 1833.3   IV Piggyback 210   Total Intake(mL/kg) 2273.3 (23.5)   Urine (mL/kg/hr) 1990 (0.9)   Stool 0 (0)   Total Output 1990   Net +283.3       Stool Occurrence 3 x    - I/O DETAILED x24h    Total I/O In: 1150 [I.V.:110; NG/GT:830; IV Piggyback:210] Out: 650 [Urine:650] - I/O THIS SHIFT    ASSESSMENT   eICURN Interventions   Electrolyte protocol criteria met. Lab values replaced per protocol. MD notified.   ASSESSMENT: MAJOR ELECTROLYTE    Lorene Dy 05/26/2014, 5:57 AM

## 2014-05-26 NOTE — Progress Notes (Signed)
Patient ID: Carolyn CourtGeraldine Jezewski, female   DOB: March 06, 1945, 69 y.o.   MRN: [id number] Afeb, vss Eyes, open to name Motor, localizes on left Verbal without  She appears to be definitely tracking and seems close to following commands Will continue present supportive care.

## 2014-05-27 ENCOUNTER — Inpatient Hospital Stay (HOSPITAL_COMMUNITY): Payer: BLUE CROSS/BLUE SHIELD

## 2014-05-27 DIAGNOSIS — E870 Hyperosmolality and hypernatremia: Secondary | ICD-10-CM

## 2014-05-27 LAB — BASIC METABOLIC PANEL
Anion gap: 8 (ref 5–15)
BUN: 19 mg/dL (ref 6–20)
CO2: 31 mmol/L (ref 22–32)
Calcium: 9 mg/dL (ref 8.9–10.3)
Chloride: 104 mmol/L (ref 101–111)
Creatinine, Ser: 0.56 mg/dL (ref 0.44–1.00)
GFR calc Af Amer: >60 mL/min (ref >60)
GLUCOSE: 129 mg/dL — AB (ref 70–99)
POTASSIUM: 3.8 mmol/L (ref 3.5–5.1)
SODIUM: 143 mmol/L (ref 135–145)

## 2014-05-27 LAB — CBC WITH DIFFERENTIAL/PLATELET
BASOS PCT: 0 % (ref 0–1)
Basophils Absolute: 0 10*3/uL (ref 0.0–0.1)
Eosinophils Absolute: 0.5 10*3/uL (ref 0.0–0.7)
Eosinophils Relative: 4 % (ref 0–5)
HCT: 35.1 % — ABNORMAL LOW (ref 36.0–46.0)
Hemoglobin: 10.7 g/dL — ABNORMAL LOW (ref 12.0–15.0)
LYMPHS PCT: 15 % (ref 12–46)
Lymphs Abs: 2.1 10*3/uL (ref 0.7–4.0)
MCH: 27.2 pg (ref 26.0–34.0)
MCHC: 30.5 g/dL (ref 30.0–36.0)
MCV: 89.3 fL (ref 78.0–100.0)
MONO ABS: 1 10*3/uL (ref 0.1–1.0)
MONOS PCT: 7 % (ref 3–12)
Neutro Abs: 9.8 10*3/uL — ABNORMAL HIGH (ref 1.7–7.7)
Neutrophils Relative %: 74 % (ref 43–77)
Platelets: 278 10*3/uL (ref 150–400)
RBC: 3.93 MIL/uL (ref 3.87–5.11)
RDW: 15.4 % (ref 11.5–15.5)
WBC: 13.4 10*3/uL — AB (ref 4.0–10.5)

## 2014-05-27 LAB — GLUCOSE, CAPILLARY
GLUCOSE-CAPILLARY: 106 mg/dL — AB (ref 70–99)
GLUCOSE-CAPILLARY: 108 mg/dL — AB (ref 70–99)
GLUCOSE-CAPILLARY: 134 mg/dL — AB (ref 70–99)
Glucose-Capillary: 147 mg/dL — ABNORMAL HIGH (ref 70–99)
Glucose-Capillary: 111 mg/dL — ABNORMAL HIGH (ref 70–99)
Glucose-Capillary: 133 mg/dL — ABNORMAL HIGH (ref 70–99)
Glucose-Capillary: 137 mg/dL — ABNORMAL HIGH (ref 70–99)
Glucose-Capillary: 125 mg/dL — ABNORMAL HIGH (ref 70–99)
Glucose-Capillary: 134 mg/dL — ABNORMAL HIGH (ref 70–99)

## 2014-05-27 LAB — MAGNESIUM: MAGNESIUM: 2.1 mg/dL (ref 1.7–2.4)

## 2014-05-27 LAB — PHOSPHORUS: Phosphorus: 3.7 mg/dL (ref 2.5–4.6)

## 2014-05-27 MED ORDER — ASPIRIN 81 MG PO CHEW
81.0000 mg | CHEWABLE_TABLET | Freq: Every day | ORAL | Status: DC
  Administered 2014-05-27 – 2014-06-03 (×7): 81 mg via ORAL
  Filled 2014-05-27 (×7): qty 1

## 2014-05-27 NOTE — Progress Notes (Signed)
PULMONARY  / CRITICAL CARE MEDICINE  Name: Carolyn [name] MRN: [id number] DOB: 1945-07-14    LOS: 7  REFERRING MD :  Dr. Roseanne Reno  CHIEF COMPLAINT:  ICH  BRIEF PATIENT DESCRIPTION:  45 F with PMH of htn and recent stent of AAA performed @ [facility] presented to the ED 4/28 with L hemispheric hemorrhagic CVA. Intubated in ED due to depressed LOC and admitted to [facility] service with Stroke and NS consultants. 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      INDWELLING DEVICES:: ETT 4/28 >>  R radial A-line 4/28 >>  PICC (ordered) 4/29 >>   MICRO DATA: MRSA PCR 4/28 >> POS  ANTIMICROBIALS:   None   MAJOR EVENTS/TEST RESULTS: 4/28 admitted as above to [facility] service. Stroke team consultation. NS consultation 4/28 CT head: Large left frontal parietal intracerebral hemorrhage with a 7.5 mm of midline shift. The hemorrhage has extended into the left lateral ventricle 4/28 Procedure: Left frontoparietal craniotomy for evacuation of intracerebral hematoma. Surgeon: Gerlene Fee 4/29 CT head: Interval decompression of the left-sided cerebral hemorrhage, with subdural drain in place along the craniotomy site. The hematoma is significantly reduced in volume. There is gas in the center of the hematoma and a small amount of gas and blood products in the extra-axial space on the left. Intraventricular hemorrhage in both lateral ventricles and in the third and fourth ventricle, without hydrocephalus. Faintly increased density along the right temporal lobe is compatible with a small amount of new subarachnoid hemorrhage along the right temporal lobe. 3 mm of left-to-right midline shift 4/29 Remains intubated, sedated, on nicardipine, propofol 4/29 TTE: nml LV fn 4/30 CT head: "improved" 5/1 head CT (brady) >> no change 05/25/14: Afebrile. A-line BP reading 20 points higher than cuff    SUBJECTIVE/OVERNIGHT/INTERVAL HX 05/26/14: Per neuro - patient a bit more awake but not ready for  extubation.Neuro has ordered MRI.   VITAL SIGNS: Temp:  [98.8 F (37.1 C)-99 F (37.2 C)] 99 F (37.2 C) (05/05 0800) Pulse Rate:  [60-93] 79 (05/05 0800) Resp:  [12-20] 16 (05/05 0800) BP: (122-167)/(41-108) 151/46 mmHg (05/05 0800) SpO2:  [97 %-100 %] 97 % (05/05 0800) FiO2 (%):  [40 %-100 %] 40 % (05/05 0735) Weight:  [98.2 kg (216 lb 7.9 oz)] 98.2 kg (216 lb 7.9 oz) (05/05 0402) HEMODYNAMICS:   VENTILATOR SETTINGS: Vent Mode:  [-] PSV;CPAP FiO2 (%):  [40 %-100 %] 40 % Set Rate:  [16 bmp] 16 bmp Vt Set:  [440 mL] 440 mL PEEP:  [5 cmH20] 5 cmH20 Pressure Support:  [5 cmH20] 5 cmH20 Plateau Pressure:  [11 cmH20-15 cmH20] 14 cmH20   INTAKE / OUTPUT: Intake/Output      05/04 0701 - 05/05 0700 05/05 0701 - 05/06 0700   I.V. (mL/kg) 280 (2.9) 30 (0.3)   NG/GT 1905 55   IV Piggyback 210 105   Total Intake(mL/kg) 2395 (24.4) 190 (1.9)   Urine (mL/kg/hr) 2300 (1) 225 (1.2)   Stool     Total Output 2300 225   Net +95 -35         PHYSICAL EXAMINATION: General:  Acutely ill, intubated, not following any commands. Neuro: RASS -1, R hemiplegia improving, left side mobile but not following commands. HEENT: Surgical dressing in place. Cardiovascular:  Reg, no M/R/G. Lungs: clear anteriorly. Abdomen:  Obese, soft, +BS Ext: warm, no edema  LABS:  PULMONARY  Recent Labs Lab 05/20/14 1356 05/20/14 2155 05/23/14 0408 05/26/14 0320  PHART  --  7.368 7.397 7.412  PCO2ART  --  41.7 45.8* 45.2*  PO2ART  --  74.9* 85.3 72.0*  HCO3  --  23.7 27.6* 28.6*  TCO2 24 25.0 29.0 30  O2SAT  --  94.2 96.4 94.0   CBC  Recent Labs Lab 05/25/14 0530 05/26/14 0330 05/27/14 0600  HGB 10.8* 10.5* 10.7*  HCT 36.5 35.0* 35.1*  WBC 12.0* 12.2* 13.4*  PLT 284 271 278   COAGULATION  Recent Labs Lab 05/20/14 1343 05/21/14 0400  INR 0.91 0.95   CARDIAC  No results for input(s): TROPONINI in the last 168 hours. No results for input(s): PROBNP in the last 168  hours.  CHEMISTRY  Recent Labs Lab 05/23/14 0350 05/24/14 0400 05/25/14 0530 05/26/14 0330 05/27/14 0600  NA 143 146* 146* 144 143  K 4.0 3.7 3.7 3.5 3.8  CL 109 108 106 105 104  CO2 27 30 28 29 31   GLUCOSE 150* 133* 136* 115* 129*  BUN 19 19 19 19 19   CREATININE 0.56 0.52 0.52 0.55 0.56  CALCIUM 8.8* 8.6* 9.1 9.1 9.0  MG 2.1 2.0  --  2.2 2.1  PHOS 2.7 3.1  --  4.4 3.7   Estimated Creatinine Clearance: 76.6 mL/min (by C-G formula based on Cr of 0.56).  LIVER  Recent Labs Lab 05/20/14 1343 05/21/14 0400  AST 28 73*  ALT 29 34  ALKPHOS 125* 108  BILITOT 0.6 1.1  PROT 6.7 6.6  ALBUMIN 3.9 3.6  INR 0.91 0.95   INFECTIOUS No results for input(s): LATICACIDVEN, PROCALCITON in the last 168 hours.  ENDOCRINE CBG (last 3)   Recent Labs  05/27/14 0037 05/27/14 0328 05/27/14 0808  GLUCAP 134* 106* 133*   IMAGING x48h Ct Head Wo Contrast  05/26/2014   CLINICAL DATA:  Follow-up intracranial hemorrhage  EXAM: CT HEAD WITHOUT CONTRAST  TECHNIQUE: Contiguous axial images were obtained from the base of the skull through the vertex without intravenous contrast.  COMPARISON:  05/23/2014, 05/22/2014  FINDINGS: There is no interval change in volume of the intraparenchymal hemorrhage near the left vertex, measuring approximately 3 x 6.5 cm. Intraventricular hemorrhage is mildly reduced in volume. Ventricular size remains unchanged. No new intracranial hemorrhage is evident.  There is normal evolution of the left occipital infarction, becoming more hypodense. Developing hypodensity in the region of the left internal capsule genu and globus pallidus also has the appearance of evolving infarction.  Frontal craniotomy again noted.  IMPRESSION: *No significant change in volume of intraparenchymal hemorrhage. No new intracranial hemorrhage. *Decreasing volume of intraventricular hemorrhage. No change in size of the cerebral ventricles. *Recent nonhemorrhagic infarctions in the left occipital  lobe and in the left globus pallidus/internal capsule genu.   Electronically Signed   By: Ellery Plunk M.D.   On: 05/26/2014 03:06   Mr Maxine Glenn Head Wo Contrast  05/26/2014   CLINICAL DATA:  Craniotomy for intracranial hemorrhage an ischemic infarcts.  EXAM: MRI HEAD WITHOUT AND WITH CONTRAST  MRA HEAD WITHOUT CONTRAST  MRA NECK WITHOUT AND WITH CONTRAST  TECHNIQUE: Multiplanar, multiecho pulse sequences of the brain and surrounding structures were obtained without and with intravenous contrast. Angiographic images of the Circle of Willis were obtained using MRA technique without intravenous contrast. Angiographic images of the neck were obtained using MRA technique without and with intravenous contrast. Carotid stenosis measurements (when applicable) are obtained utilizing NASCET criteria, using the distal internal carotid diameter as the denominator.  CONTRAST:  20mL MULTIHANCE GADOBENATE DIMEGLUMINE 529 MG/ML IV SOLN  COMPARISON:  CT of  the head 5/4/6 stain, 05/23/2014, and 05/21/2014.  FINDINGS: MRI HEAD FINDINGS  A left frontoparietal craniotomy is noted with evacuation of the left-sided hemorrhage. The area of residual hemorrhage is similar to the prior studies, measuring 3.4 x 5.9 x 3.2 cm. There is surrounding vasogenic edema. There is also enhancement surrounding this hemorrhage.  The postcontrast images demonstrate intravascular enhancement throughout the posterior temporal lobes, occipital lobes, and bilateral parietal lobes, left greater than right.  Areas of acute nonhemorrhagic infarction are present within the posterior left temporal lobe and bilateral occipital lobes, left greater than right. A punctate focus is present within the medial posterior left frontal lobe. Acute nonhemorrhagic infarct is present at the left globus pallidus.  The residual hemorrhage continues to produce some mass effect on the left lateral ventricle. There is 3 mm of residual left-to-right midline shift. Layering blood  products are noted within the lateral ventricles bilaterally without hydrocephalus.  Subarachnoid blood is again noted over the right temporal convexity. T2 changes of the CSF near the vertex bilaterally may be related to blood products. This can be seen in the setting of high oxygen tension as well, more likely in this case.  Flow is present in the major intracranial arteries. The globes and orbits are intact. The paranasal sinuses are clear. There is some fluid in the right mastoid air cells. No obstructing nasopharyngeal lesion is present.  Midline structures are otherwise within normal limits.  MRA HEAD FINDINGS  Internal carotid arteries demonstrate mild atherosclerotic irregularity within the cavernous segments bilaterally, worse on the left. There is no significant stenosis. The terminal ICA is normal bilaterally. There is mild narrowing of the A1 segments bilaterally without a significant stenosis. The anterior communicating artery is patent. The M1 segments are normal. The MCA bifurcations are unremarkable. There is mild attenuation of MCA branch vessels bilaterally.  More moderate segmental attenuation is present within both vertebral arteries. The right vertebral artery essentially bifurcates at the PICA. The vertebrobasilar junction is normal. The basilar artery is small. The right posterior cerebral artery is of fetal type. The left posterior cerebral artery originates from the basilar tip. There is moderate attenuation of the PCA branch vessels bilaterally.  MRA NECK FINDINGS  The time-of-flight images demonstrate narrowing of the left internal carotid artery. There is no significant flow disturbance at the right carotid bifurcation. Flow is antegrade in the vertebral arteries bilaterally.  Both vertebral arteries originate from the subclavian arteries. There is a moderate to high-grade stenosis of proximal right vertebral artery. No other significant stenoses are evident. Postcontrast imaging of the  vertebrobasilar junction appears more normal than the time-of-flight images.  The right common carotid artery is within normal limits. There is focal plaque at the right carotid bifurcation without a significant stenosis. Mild tortuosity is present in the cervical right ICA without significant stenosis.  The left common carotid artery is within normal limits. Moderate stenosis is present in the proximal right internal carotid artery with an associated ulcerative plaque. The lumen is narrowed to 1.8 mm this compares with a more normal distal caliber of 4.1 mm.  IMPRESSION: 1. Stable appearance of residual left frontal and parietal hemorrhage following the craniotomy. 2. Areas of acute/subacute nonhemorrhagic infarct within the occipital lobes bilaterally, the posterior left temporal lobe, and the left globus pallidus. 3. 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 The configuration suggests this could be an on stable plaque. 4. Moderate to high-grade stenosis of the proximal right  vertebral artery without a significant distal stenosis. 5. Mild atherosclerotic changes at the right carotid bifurcation and bilateral cavernous internal carotid arteries without other stenoses. 6. Subarachnoid hemorrhage over the right temporal lobe. 7. Intravascular enhancement in the posterior circulation territories suggests luxury perfusion or less likely meningitis. 8. Nonspecific enhancement surrounding the area of hemorrhage likely related to the recent surgery.   Electronically Signed   By: Marin Roberts M.D.   On: 05/26/2014 13:53   Mr Angiogram Neck W Wo Contrast  05/26/2014   CLINICAL DATA:  Craniotomy for intracranial hemorrhage an ischemic infarcts.  EXAM: MRI HEAD WITHOUT AND WITH CONTRAST  MRA HEAD WITHOUT CONTRAST  MRA NECK WITHOUT AND WITH CONTRAST  TECHNIQUE: Multiplanar, multiecho pulse sequences of the brain and surrounding structures were obtained without and  with intravenous contrast. Angiographic images of the Circle of Willis were obtained using MRA technique without intravenous contrast. Angiographic images of the neck were obtained using MRA technique without and with intravenous contrast. Carotid stenosis measurements (when applicable) are obtained utilizing NASCET criteria, using the distal internal carotid diameter as the denominator.  CONTRAST:  20mL MULTIHANCE GADOBENATE DIMEGLUMINE 529 MG/ML IV SOLN  COMPARISON:  CT of the head 5/4/6 stain, 05/23/2014, and 05/21/2014.  FINDINGS: MRI HEAD FINDINGS  A left frontoparietal craniotomy is noted with evacuation of the left-sided hemorrhage. The area of residual hemorrhage is similar to the prior studies, measuring 3.4 x 5.9 x 3.2 cm. There is surrounding vasogenic edema. There is also enhancement surrounding this hemorrhage.  The postcontrast images demonstrate intravascular enhancement throughout the posterior temporal lobes, occipital lobes, and bilateral parietal lobes, left greater than right.  Areas of acute nonhemorrhagic infarction are present within the posterior left temporal lobe and bilateral occipital lobes, left greater than right. A punctate focus is present within the medial posterior left frontal lobe. Acute nonhemorrhagic infarct is present at the left globus pallidus.  The residual hemorrhage continues to produce some mass effect on the left lateral ventricle. There is 3 mm of residual left-to-right midline shift. Layering blood products are noted within the lateral ventricles bilaterally without hydrocephalus.  Subarachnoid blood is again noted over the right temporal convexity. T2 changes of the CSF near the vertex bilaterally may be related to blood products. This can be seen in the setting of high oxygen tension as well, more likely in this case.  Flow is present in the major intracranial arteries. The globes and orbits are intact. The paranasal sinuses are clear. There is some fluid in the  right mastoid air cells. No obstructing nasopharyngeal lesion is present.  Midline structures are otherwise within normal limits.  MRA HEAD FINDINGS  Internal carotid arteries demonstrate mild atherosclerotic irregularity within the cavernous segments bilaterally, worse on the left. There is no significant stenosis. The terminal ICA is normal bilaterally. There is mild narrowing of the A1 segments bilaterally without a significant stenosis. The anterior communicating artery is patent. The M1 segments are normal. The MCA bifurcations are unremarkable. There is mild attenuation of MCA branch vessels bilaterally.  More moderate segmental attenuation is present within both vertebral arteries. The right vertebral artery essentially bifurcates at the PICA. The vertebrobasilar junction is normal. The basilar artery is small. The right posterior cerebral artery is of fetal type. The left posterior cerebral artery originates from the basilar tip. There is moderate attenuation of the PCA branch vessels bilaterally.  MRA NECK FINDINGS  The time-of-flight images demonstrate narrowing of the left internal carotid artery. There is no significant  flow disturbance at the right carotid bifurcation. Flow is antegrade in the vertebral arteries bilaterally.  Both vertebral arteries originate from the subclavian arteries. There is a moderate to high-grade stenosis of proximal right vertebral artery. No other significant stenoses are evident. Postcontrast imaging of the vertebrobasilar junction appears more normal than the time-of-flight images.  The right common carotid artery is within normal limits. There is focal plaque at the right carotid bifurcation without a significant stenosis. Mild tortuosity is present in the cervical right ICA without significant stenosis.  The left common carotid artery is within normal limits. Moderate stenosis is present in the proximal right internal carotid artery with an associated ulcerative plaque.  The lumen is narrowed to 1.8 mm this compares with a more normal distal caliber of 4.1 mm.  IMPRESSION: 1. Stable appearance of residual left frontal and parietal hemorrhage following the craniotomy. 2. Areas of acute/subacute nonhemorrhagic infarct within the occipital lobes bilaterally, the posterior left temporal lobe, and the left globus pallidus. 3. 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 The configuration suggests this could be an on stable plaque. 4. Moderate to high-grade stenosis of the proximal right vertebral artery without a significant distal stenosis. 5. Mild atherosclerotic changes at the right carotid bifurcation and bilateral cavernous internal carotid arteries without other stenoses. 6. Subarachnoid hemorrhage over the right temporal lobe. 7. Intravascular enhancement in the posterior circulation territories suggests luxury perfusion or less likely meningitis. 8. Nonspecific enhancement surrounding the area of hemorrhage likely related to the recent surgery.   Electronically Signed   By: Marin Roberts M.D.   On: 05/26/2014 13:53   Mr Laqueta Jean WU Contrast  05/26/2014   CLINICAL DATA:  Craniotomy for intracranial hemorrhage an ischemic infarcts.  EXAM: MRI HEAD WITHOUT AND WITH CONTRAST  MRA HEAD WITHOUT CONTRAST  MRA NECK WITHOUT AND WITH CONTRAST  TECHNIQUE: Multiplanar, multiecho pulse sequences of the brain and surrounding structures were obtained without and with intravenous contrast. Angiographic images of the Circle of Willis were obtained using MRA technique without intravenous contrast. Angiographic images of the neck were obtained using MRA technique without and with intravenous contrast. Carotid stenosis measurements (when applicable) are obtained utilizing NASCET criteria, using the distal internal carotid diameter as the denominator.  CONTRAST:  20mL MULTIHANCE GADOBENATE DIMEGLUMINE 529 MG/ML IV SOLN  COMPARISON:  CT of the  head 5/4/6 stain, 05/23/2014, and 05/21/2014.  FINDINGS: MRI HEAD FINDINGS  A left frontoparietal craniotomy is noted with evacuation of the left-sided hemorrhage. The area of residual hemorrhage is similar to the prior studies, measuring 3.4 x 5.9 x 3.2 cm. There is surrounding vasogenic edema. There is also enhancement surrounding this hemorrhage.  The postcontrast images demonstrate intravascular enhancement throughout the posterior temporal lobes, occipital lobes, and bilateral parietal lobes, left greater than right.  Areas of acute nonhemorrhagic infarction are present within the posterior left temporal lobe and bilateral occipital lobes, left greater than right. A punctate focus is present within the medial posterior left frontal lobe. Acute nonhemorrhagic infarct is present at the left globus pallidus.  The residual hemorrhage continues to produce some mass effect on the left lateral ventricle. There is 3 mm of residual left-to-right midline shift. Layering blood products are noted within the lateral ventricles bilaterally without hydrocephalus.  Subarachnoid blood is again noted over the right temporal convexity. T2 changes of the CSF near the vertex bilaterally may be related to blood products. This can be seen in the setting of  high oxygen tension as well, more likely in this case.  Flow is present in the major intracranial arteries. The globes and orbits are intact. The paranasal sinuses are clear. There is some fluid in the right mastoid air cells. No obstructing nasopharyngeal lesion is present.  Midline structures are otherwise within normal limits.  MRA HEAD FINDINGS  Internal carotid arteries demonstrate mild atherosclerotic irregularity within the cavernous segments bilaterally, worse on the left. There is no significant stenosis. The terminal ICA is normal bilaterally. There is mild narrowing of the A1 segments bilaterally without a significant stenosis. The anterior communicating artery is  patent. The M1 segments are normal. The MCA bifurcations are unremarkable. There is mild attenuation of MCA branch vessels bilaterally.  More moderate segmental attenuation is present within both vertebral arteries. The right vertebral artery essentially bifurcates at the PICA. The vertebrobasilar junction is normal. The basilar artery is small. The right posterior cerebral artery is of fetal type. The left posterior cerebral artery originates from the basilar tip. There is moderate attenuation of the PCA branch vessels bilaterally.  MRA NECK FINDINGS  The time-of-flight images demonstrate narrowing of the left internal carotid artery. There is no significant flow disturbance at the right carotid bifurcation. Flow is antegrade in the vertebral arteries bilaterally.  Both vertebral arteries originate from the subclavian arteries. There is a moderate to high-grade stenosis of proximal right vertebral artery. No other significant stenoses are evident. Postcontrast imaging of the vertebrobasilar junction appears more normal than the time-of-flight images.  The right common carotid artery is within normal limits. There is focal plaque at the right carotid bifurcation without a significant stenosis. Mild tortuosity is present in the cervical right ICA without significant stenosis.  The left common carotid artery is within normal limits. Moderate stenosis is present in the proximal right internal carotid artery with an associated ulcerative plaque. The lumen is narrowed to 1.8 mm this compares with a more normal distal caliber of 4.1 mm.  IMPRESSION: 1. Stable appearance of residual left frontal and parietal hemorrhage following the craniotomy. 2. Areas of acute/subacute nonhemorrhagic infarct within the occipital lobes bilaterally, the posterior left temporal lobe, and the left globus pallidus. 3. 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 The configuration suggests this  could be an on stable plaque. 4. Moderate to high-grade stenosis of the proximal right vertebral artery without a significant distal stenosis. 5. Mild atherosclerotic changes at the right carotid bifurcation and bilateral cavernous internal carotid arteries without other stenoses. 6. Subarachnoid hemorrhage over the right temporal lobe. 7. Intravascular enhancement in the posterior circulation territories suggests luxury perfusion or less likely meningitis. 8. Nonspecific enhancement surrounding the area of hemorrhage likely related to the recent surgery.   Electronically Signed   By: Marin Robertshristopher  Mattern M.D.   On: 05/26/2014 13:53   Dg Chest Port 1 View  05/27/2014   CLINICAL DATA:  Intubation.  EXAM: PORTABLE CHEST - 1 VIEW  COMPARISON:  05/26/2014.  FINDINGS: Endotracheal tube, left PICC line, NG tube in stable position. Mediastinum hilar structures are stable. Low lung volumes with mild bibasilar atelectasis and/or infiltrates. No pleural effusion or pneumothorax.  IMPRESSION: 1. Lines and tubes in stable position. 2. Low lung volumes with state mild bibasilar subsegmental atelectasis and/or infiltrates.   Electronically Signed   By: Maisie Fushomas  Register   On: 05/27/2014 07:04   Dg Chest Port 1 View  05/26/2014   CLINICAL DATA:  Respiratory difficulty  EXAM: PORTABLE CHEST -  1 VIEW  COMPARISON:  Yesterday  FINDINGS: Endotracheal tube and NG tube are stable. Tip of the left arm PICC is now at the cavoatrial junction. Bibasilar hazy opacity has increased with low lung volumes. No pneumothorax.  IMPRESSION: Left upper extremity PICC tip is now at the cavoatrial junction.  Bibasilar atelectasis versus airspace disease has developed.   Electronically Signed   By: Jolaine Click M.D.   On: 05/26/2014 07:34   ASSESSMENT / PLAN:  PULMONARY ASSESSMENT: Acute respiratory failure due to CVA, AMS  - doing SBT but mental status precludes extubation  PLAN:   Will begin PS trials but no extubation given mental  status. Cont vent bundle. Titrate O2 for sat of 88-92%. Will likely need a trach/peg, will await prognostication from neuro.  CARDIOVASCULAR  ASSESSMENT:  Hypertensive emergency AAA s/p recent stent grafting  Hypotensive and junctional brady with sinus pauses   - No acute issues 05/26/14. Cards 05/25/14: felt vagally mediated bradycardia  PLAN:  Dc dopamine SBP goal < 140 mmHg  RENAL  ASSESSMENT:  Nil acute   PLAN:   Monitor BMET intermittently Monitor I/Os Correct electrolytes as indicated Adjust free water for Na levels  GASTROINTESTINAL  ASSESSMENT:  No acute issues  PLAN:   SUP: enteral PPI Continue TFs  Cont free water.  HEMATOLOGIC  ASSESSMENT:   Plavix, ASA prior to admission (indication: recent AAA stent)  Received plt transfusion in ED PLAN:  DVT px: SCDs. Monitor CBC intermittently. Transfuse per usual ICU guidelines.  INFECTIOUS  ASSESSMENT: No evidence of infection PLAN:   Monitor temp, WBC count. Micro and abx as above.  ENDOCRINE  ASSESSMENT:   Hypothyroidism (L-thyroxine 75 mcg daily PTA) Hyperglycemia without prior DM history PLAN:   Cont L-thyroxine SSI  NEUROLOGIC  ASSESSMENT:   Hypertensive [facility] S/P craniotomy 4/28 Post op pain ICU associated discomfort   - Rt side weak, gag +, MRI pending 05/26/14  PLAN:   RASS goal: 0 Fentanyl  Intermittent. Add versed prn for MRI Avoid Precedex  - caused significant bradycardia and hypotension. Daily WUA Post op mgmt per NS Further stroke eval per Stroke team  GLOBA 05/27/14: Spoke with daughter and son at length bedside, explained tracheostomy procedure and encouraged that if they wish for a trach/peg then we should do sooner rather than later.  They will call the third brother and let us know later today.    The patient is critically ill with multiple organ systems failure and requires high complexity decision making for assessment and support, frequent evaluation and titration of  therapies, application of advanced monitoring technologies and extensive interpretation of multiple databases.   Critical Care Time devoted to patient care services described in this note is  35  Minutes. This time reflects time of care of this signee Dr Koren Bound. This critical care time does not reflect procedure time, or teaching time or supervisory time of PA/NP/Med student/Med Resident etc but could involve care discussion time.  Alyson Reedy, M.D. [facility] Pulmonary/Critical Care Medicine. Pager: [phone]. After hours pager: [phone].  05/27/2014 8:57 AM

## 2014-05-27 NOTE — Progress Notes (Signed)
Patient ID: Carolyn [name], female   DOB: 04/09/1945, 69 y.o.   MRN: [id number] Afeb, vss No new neuro changes Still opens her eyes but does not follow commands. She does appear to track visually.  MRI yesterday shows some other areas of infarction. Need to give a few more days, and then family will most likely decide on additional supportive care or not.

## 2014-05-27 NOTE — Progress Notes (Signed)
STROKE TEAM PROGRESS NOTE   HISTORY Carolyn [name] is a 69 y.o. female brought to hospital as code stroke. EMS was called to her work and told she was noted to have right arm weakness then noted to complain of severe HA which then was followed by sudden unconsciousness. LKW 05/20/2014 at 1240. On arrival she was unconscious on NRB but not protectin g airway well. CT head was obtained and showed a large left parietal ICH. While in ED patient has one episode emesis and intubated for air way protection. NIH stroke score was 31. Blood pressure was markedly elevated, and at one point was 241/123. Emergency management of hypertension was undertaken with use of Cardene intravenously. She was admitted to the neuro ICU for further evaluation and treatment.   SUBJECTIVE (INTERVAL HISTORY)  She is even more arousable today and tries to open eyes but remains aphasic and not following commands and dense Rt hemiplegia. Blood pressure adequately controlled. Daughter and son Carolyn [name] at bedside. Long 20 min discussion with then about goals of care, trach/pEG  And SNF versus comfort care about prognosis and plan of care. MRI scan of the brain shows ischemic left basal ganglia and left parietal-occipital infarcts as well as significant diffusion positivity around the evacuated left frontal hematoma and some postsurgical enhancement. MRA of the neck shows 60% proximal left ICA stenosis with ulcerated plaque which could be symptomatic  OBJECTIVE Temp:  [98.4 F (36.9 C)-99 F (37.2 C)] 98.4 F (36.9 C) (05/05 1205) Pulse Rate:  [60-93] 87 (05/05 1200) Cardiac Rhythm:  [-] Normal sinus rhythm (05/05 0800) Resp:  [12-20] 18 (05/05 1200) BP: (122-167)/(41-108) 155/49 mmHg (05/05 1200) SpO2:  [97 %-100 %] 100 % (05/05 1200) FiO2 (%):  [40 %] 40 % (05/05 1128) Weight:  [216 lb 7.9 oz (98.2 kg)] 216 lb 7.9 oz (98.2 kg) (05/05 0402)   Recent Labs Lab 05/26/14 1942 05/27/14 0037 05/27/14 0328 05/27/14 0808  05/27/14 1203  GLUCAP 111* 134* 106* 133* 137*    Recent Labs Lab 05/23/14 0350 05/24/14 0400 05/25/14 0530 05/26/14 0330 05/27/14 0600  NA 143 146* 146* 144 143  K 4.0 3.7 3.7 3.5 3.8  CL 109 108 106 105 104  CO2 GLUCOSE 150* 133* 136* 115* 129*  BUN CREATININE 0.56 0.52 0.52 0.55 0.56  CALCIUM 8.8* 8.6* 9.1 9.1 9.0  MG 2.1 2.0  --  2.2 2.1  PHOS 2.7 3.1  --  4.4 3.7    Recent Labs Lab 05/20/14 1343 05/21/14 0400  AST 28 73*  ALT 29 34  ALKPHOS 125* 108  BILITOT 0.6 1.1  PROT 6.7 6.6  ALBUMIN 3.9 3.6    Recent Labs Lab 05/20/14 1343  05/23/14 0350 05/24/14 0400 05/25/14 0530 05/26/14 0330 05/27/14 0600  WBC 9.7  < > 13.5* 11.8* 12.0* 12.2* 13.4*  NEUTROABS 6.3  --   --   --   --   --  9.8*  HGB 12.7  < > 9.6* 10.1* 10.8* 10.5* 10.7*  HCT 40.0  < > 31.7* 32.9* 36.5 35.0* 35.1*  MCV 85.5  < > 87.3 88.7 88.4 88.4 89.3  PLT 234  < > 279 268 284 271 278  < > = values in this interval not displayed. No results for input(s): CKTOTAL, CKMB, CKMBINDEX, TROPONINI in the last 168 hours. No results for input(s): LABPROT, INR in the last 72 hours. No results for input(s): COLORURINE, LABSPEC, PHURINE, GLUCOSEU,  HGBUR, BILIRUBINUR, KETONESUR, PROTEINUR, UROBILINOGEN, NITRITE, LEUKOCYTESUR in the last 72 hours.  Invalid input(s): APPERANCEUR     Component Value Date/Time   CHOL 242* 05/21/2014 1048   TRIG 181* 05/24/2014 0400   HDL 61 05/21/2014 1048   CHOLHDL 4.0 05/21/2014 1048   VLDL 49* 05/21/2014 1048   LDLCALC 132* 05/21/2014 1048   Lab Results  Component Value Date   HGBA1C 5.9* 05/21/2014      Component Value Date/Time   LABOPIA NONE DETECTED 05/20/2014 1513   COCAINSCRNUR NONE DETECTED 05/20/2014 1513   LABBENZ NONE DETECTED 05/20/2014 1513   AMPHETMU NONE DETECTED 05/20/2014 1513   THCU NONE DETECTED 05/20/2014 1513   LABBARB NONE DETECTED 05/20/2014 1513     Recent Labs Lab 05/20/14 1343  ETH <5    Ct  Head Wo Contrast  05/21/2014   IMPRESSION: 1. Interval decompression of the left-sided cerebral hemorrhage, with subdural drain in place along the craniotomy site. The hematoma is significantly reduced in volume. There is gas in the center of the hematoma and a small amount of gas and blood products in the extra-axial space on the left. 2. Intraventricular hemorrhage in both lateral ventricles and in the third and fourth ventricle, without hydrocephalus. 3. Faintly increased density along the right temporal lobe is compatible with a small amount of new subarachnoid hemorrhage along the right temporal lobe. 4. 3 mm of left-to-right midline shift.     05/20/2014   IMPRESSION: Large left frontal parietal intracerebral hemorrhage with a 7.5 mm of midline shift. The hemorrhage has extended into the left lateral ventricle.    Dg Chest Port 1 View  05/21/2014    IMPRESSION: NG tube placed.  Stable bibasilar atelectasis.  No evidence of pulmonary edema.     05/20/2014   IMPRESSION: Endotracheal tube tip at the carina with the tip directed toward the right main bronchus. No edema or consolidation. No pneumothorax.    2D echo -  estimated ejection fraction was in the range of 65% to 70%   PHYSICAL EXAM  General - Well nourished, well developed elderly Caucasian lady, intubated   Ophthalmologic - fundi not visualized due to incorporation.  Cardiovascular - Regular rate and rhythm with no murmur.  Neuro - intubated .drowsy, .  opening eyes partially on voice  And with stimulation and not following commands.    PERRL, pupil 3mm reactive to light, doll's eye present, corneal reflex and gag and cough reflex weakly positive. Breathing over the vent. Spontaneously moving LUE and LLE, but 0/5 RUE on pain and trace withdraw on RLE on pain stimulation, no babinski bilaterally. Reflex 1+.    ASSESSMENT/PLAN [name] is a 69 y.o. female with history of hypertension presenting with right arm weakness,  severe headache, and subsequent unconsciousness.  She did not receive IV t-PA due to a large left frontal parietal intracerebral hemorrhage.  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  Repeat CT scans show new interval left parieto-occipital and basal ganglia infarcts raising concern for the initial hematoma being hemorrhagic transformation into a embolic stroke and STROKES brain embolic from proximal left ICA stenosis with ulcerated plaque  Resultant  Intubated and right hemiplegia  Slow to wake up  Repeat CT showed smaller hematoma and decreased midline shift2SCDs for VTE prophylaxis, will start lovenox for VTE prophylaxis   NPO  aspirin 81 mg orally every day and clopidogrel 75 mg orally every day prior to admission, now on no antithrombotic due to hemorrhage  Ongoing aggressive stroke risk factor management    EEG 05/24/14 Abnormal EEG due to generalized slowing indicating a mild to moderate cerebral disturbance (encephalopathy). Focal low voltage slowing is noted in the left hemisphere indicating a focal cerebral disturbance. No seizure activity noted  Therapy recommendations: Pending  Disposition: Pending  Cerebral edema  Midline shift improved s/p hematoma evacuation  Repeat CT no evidence of hydro   Will hold off hypertonic saline.   Neuro check  Hypertension  Home meds: Toprol-XL  Consider adding on anti hypertensives to titrate down clevedipine.   Unstable  Put on po BP meds and  off drip  BP goal < 180  Close monitoring  On fentanyl.  Bradycardia and pauses at night. On dopamine gtt. Now off Cardiology feel it is vagally mediated   Hyperlipidemia  LDL 132  Other Stroke Risk Factors  Advanced age  Obesity, Body mass index is 37.14 kg/(m^2).   Other Active Problems  Intubated  Hyperkalemia  hyperglycemia  Mild anemia  Other Pertinent History  History of abdominal aortic aneurysm repair  Hospital day #  7 I have personally examined this patient, reviewed notes, independently viewed imaging studies, participated in medical decision making and plan of care. I have made any additions or clarifications directly to the above note. The patient's mental status remains depressed with aphasia and dense right hemiplegia despite hematoma evacuation and decreasing hemorrhage size and edema. MRI scan shows nonhemorrhagic left basal ganglia and parieto-occipital infarcts as well as significant diffusion positivity around the evacuated hematoma site with MRA of the neck showing 60% proximal left ICA stenosis with ulcerated plaque suggesting that the patient's left brain infarcts are likely thromboembolic from ulcerated left carotid plaque.Will start Aspirin daily but cannot anticoagulate due to recent hemorrhage  I had a long discussion at the bedside with the patient's daughter and son regarding her neurological condition, prognosis and answered questions.  She is likely going to survive with major neurological deficit and is going to need 24-hour care and likely stay in a nursing home. She will need tracheostomy and PEG tube to facilitate nursing and medical care for her. Family to decide soon if they want to go this route or consider palliative care The daughter will discuss with the patient's other son [name] and make a decision over the next few days whether they want to pursue this. This patient is critically ill and at significant risk of neurological worsening, death and care requires constant monitoring of vital signs, hemodynamics,respiratory and cardiac monitoring,review of multiple databases, neurological assessment, discussion with family, other specialists and medical decision making of high complexity.I have made any additions or clarifications directly to the above note.  I spent 40 minutes of neurocritical care time  in the care of  this patient.  Delia HeadyPramod Sethi, MD Medical Director [facility] Pager: [phone] 05/27/2014 1:16 PM   To contact Stroke Continuity provider, please refer to [email or web address]. After hours, contact General Neurology

## 2014-05-27 NOTE — Progress Notes (Signed)
Chaplain initiated follow up with pt family. Pt family requests that chaplain meet with all three of pt children this weekend. Pt family seems to be weighing options of care. Chaplain will continue to follow. Page chaplain as needed.    05/27/14 1100  Clinical Encounter Type  Visited With Patient and family together  Visit Type Follow-up;Spiritual support  Spiritual Encounters  Spiritual Needs Emotional  Stress Factors  Family Stress Factors Major life changes  Ilynn Stauffer, Mayer MaskerCourtney F, Chaplain 05/27/2014 11:11 AM

## 2014-05-27 NOTE — Progress Notes (Signed)
Nutrition Follow-up  DOCUMENTATION CODES:  Obesity unspecified  INTERVENTION:  Tube feeding:  Vital High Protein @ 55 ml/hr via OGT   Tube feeding regimen provides 1320 kcal (100% of needs), 116 grams of protein, and 1109 ml of H2O.  Total free water: 2109 ml  NUTRITION DIAGNOSIS:  Inadequate oral intake related to inability to eat as evidenced by NPO status.  Ongoing  GOAL:  Provide needs based on ASPEN/SCCM guidelines  Not being met  MONITOR:  Vent status, Weight trends, Labs, I & O's, TF tolerance  REASON FOR ASSESSMENT:  Consult Enteral/tube feeding initiation and management  ASSESSMENT: 14 F with PMH of htn and recent stent of AAA performed @ [facility] presented to the ED 4/28 with L hemispheric hemorrhagic CVA. Intubated in ED due to depressed LOC and admitted to [facility] service with Stroke and NS consultants.   Labs reviewed.   Patient is currently intubated on ventilator support MV: 8.9 L/min Temp (24hrs), Avg:98.9 F (37.2 C), Min:98.8 F (37.1 C), Max:99 F (37.2 C)  Free water: 250 ml every 4 hrs  Per MD family considering trach/PEG   Height:  Ht Readings from Last 1 Encounters:  05/20/14 5' 4" (1.626 m)    Weight:  Wt Readings from Last 1 Encounters:  05/27/14 216 lb 7.9 oz (98.2 kg)    Ideal Body Weight:  54.5 kg  Wt Readings from Last 10 Encounters:  05/27/14 216 lb 7.9 oz (98.2 kg)    BMI:  Body mass index is 37.14 kg/(m^2).  Estimated Nutritional Needs:  Kcal:  3220-2542  Protein:  110-120 gm  Fluid:  per MD  Skin:  Reviewed, no issues  Diet Order:   NPO  EDUCATION NEEDS:  No education needs identified at this time   Intake/Output Summary (Last 24 hours) at 05/27/14 1135 Last data filed at 05/27/14 1100  Gross per 24 hour  Intake   2395 ml  Output   2200 ml  Net    195 ml    Last BM: 5/3 loose

## 2014-05-27 NOTE — Progress Notes (Signed)
*  PRELIMINARY RESULTS* Vascular Ultrasound Carotid Duplex (Doppler) has been completed.   Study was technically difficult due to patient's inability to cooperate. Findings suggest 1-39% right internal carotid artery stenosis and 40-59% left internal carotid artery stenosis. The left vertebral artery is patent with antegrade flow. Unable to visualize the right vertebral artery.   05/27/2014 3:32 PM Gertie FeyMichelle Annais Crafts, RVT, RDCS, RDMS

## 2014-05-28 ENCOUNTER — Inpatient Hospital Stay (HOSPITAL_COMMUNITY): Payer: BLUE CROSS/BLUE SHIELD

## 2014-05-28 DIAGNOSIS — R4182 Altered mental status, unspecified: Secondary | ICD-10-CM

## 2014-05-28 LAB — GLUCOSE, CAPILLARY
GLUCOSE-CAPILLARY: 128 mg/dL — AB (ref 70–99)
GLUCOSE-CAPILLARY: 97 mg/dL (ref 70–99)
Glucose-Capillary: 117 mg/dL — ABNORMAL HIGH (ref 70–99)
Glucose-Capillary: 119 mg/dL — ABNORMAL HIGH (ref 70–99)
Glucose-Capillary: 116 mg/dL — ABNORMAL HIGH (ref 70–99)

## 2014-05-28 LAB — BLOOD GAS, ARTERIAL
Acid-Base Excess: 5.3 mmol/L — ABNORMAL HIGH (ref 0.0–2.0)
Bicarbonate: 29.4 mEq/L — ABNORMAL HIGH (ref 20.0–24.0)
DRAWN BY: 347621
FIO2: 0.4 %
LHR: 16 {breaths}/min
MECHVT: 440 mL
O2 SAT: 98.7 %
PATIENT TEMPERATURE: 99.1
PEEP/CPAP: 5 cmH2O
TCO2: 30.7 mmol/L (ref 0–100)
pCO2 arterial: 44.4 mmHg (ref 35.0–45.0)
pH, Arterial: 7.437 (ref 7.350–7.450)
pO2, Arterial: 152 mmHg — ABNORMAL HIGH (ref 80.0–100.0)

## 2014-05-28 LAB — CBC WITH DIFFERENTIAL/PLATELET
Basophils Absolute: 0 10*3/uL (ref 0.0–0.1)
Basophils Relative: 0 % (ref 0–1)
Eosinophils Absolute: 0.4 10*3/uL (ref 0.0–0.7)
Eosinophils Relative: 3 % (ref 0–5)
HEMATOCRIT: 33.8 % — AB (ref 36.0–46.0)
HEMOGLOBIN: 10.3 g/dL — AB (ref 12.0–15.0)
Lymphocytes Relative: 17 % (ref 12–46)
Lymphs Abs: 2.1 10*3/uL (ref 0.7–4.0)
MCH: 26.5 pg (ref 26.0–34.0)
MCHC: 30.5 g/dL (ref 30.0–36.0)
MCV: 87.1 fL (ref 78.0–100.0)
MONO ABS: 1 10*3/uL (ref 0.1–1.0)
MONOS PCT: 8 % (ref 3–12)
NEUTROS ABS: 8.7 10*3/uL — AB (ref 1.7–7.7)
NEUTROS PCT: 72 % (ref 43–77)
Platelets: 273 10*3/uL (ref 150–400)
RBC: 3.88 MIL/uL (ref 3.87–5.11)
RDW: 15.1 % (ref 11.5–15.5)
WBC: 12.2 10*3/uL — AB (ref 4.0–10.5)

## 2014-05-28 LAB — BASIC METABOLIC PANEL
ANION GAP: 10 (ref 5–15)
BUN: 18 mg/dL (ref 6–20)
CALCIUM: 8.9 mg/dL (ref 8.9–10.3)
CO2: 29 mmol/L (ref 22–32)
CREATININE: 0.5 mg/dL (ref 0.44–1.00)
Chloride: 101 mmol/L (ref 101–111)
GFR calc Af Amer: >60 mL/min (ref >60)
GFR calc non Af Amer: >60 mL/min (ref >60)
Glucose, Bld: 119 mg/dL — ABNORMAL HIGH (ref 70–99)
Potassium: 3.6 mmol/L (ref 3.5–5.1)
SODIUM: 140 mmol/L (ref 135–145)

## 2014-05-28 LAB — PHOSPHORUS: Phosphorus: 3.8 mg/dL (ref 2.5–4.6)

## 2014-05-28 LAB — MAGNESIUM: Magnesium: 2.1 mg/dL (ref 1.7–2.4)

## 2014-05-28 NOTE — Progress Notes (Signed)
PULMONARY  / CRITICAL CARE MEDICINE  Name: Carolyn [name] MRN: [id number] DOB: 12-06-1945    LOS: 8  REFERRING MD :  Dr. Roseanne Reno  CHIEF COMPLAINT:  ICH  BRIEF PATIENT DESCRIPTION:  26 F with PMH of htn and recent stent of AAA performed @ [facility] presented to the ED 4/28 with L hemispheric hemorrhagic CVA. Intubated in ED due to depressed LOC and admitted to [facility] service with Stroke and NS consultants. 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      INDWELLING DEVICES:: ETT 4/28 >>  R radial A-line 4/28 >>  PICC (ordered) 4/29 >>   MICRO DATA: MRSA PCR 4/28 >> POS  ANTIMICROBIALS:   None   MAJOR EVENTS/TEST RESULTS: 4/28 admitted as above to [facility] service. Stroke team consultation. NS consultation 4/28 CT head: Large left frontal parietal intracerebral hemorrhage with a 7.5 mm of midline shift. The hemorrhage has extended into the left lateral ventricle 4/28 Procedure: Left frontoparietal craniotomy for evacuation of intracerebral hematoma. Surgeon: Gerlene Fee 4/29 CT head: Interval decompression of the left-sided cerebral hemorrhage, with subdural drain in place along the craniotomy site. The hematoma is significantly reduced in volume. There is gas in the center of the hematoma and a small amount of gas and blood products in the extra-axial space on the left. Intraventricular hemorrhage in both lateral ventricles and in the third and fourth ventricle, without hydrocephalus. Faintly increased density along the right temporal lobe is compatible with a small amount of new subarachnoid hemorrhage along the right temporal lobe. 3 mm of left-to-right midline shift 4/29 Remains intubated, sedated, on nicardipine, propofol 4/29 TTE: nml LV fn 4/30 CT head: "improved" 5/1 head CT (brady) >> no change 05/25/14: Afebrile. A-line BP reading 20 points higher than cuff 05/28/14: Opens eyes and squeezes hand but not wiggle toes on left.     SUBJECTIVE/OVERNIGHT/INTERVAL  HX 05/28/14: Opens eyes and squeezes hand but not wiggle toes on left.   VITAL SIGNS: Temp:  [98.4 F (36.9 C)-99.5 F (37.5 C)] 98.4 F (36.9 C) (05/06 1205) Pulse Rate:  [68-102] 77 (05/06 1200) Resp:  [13-26] 16 (05/06 1200) BP: (125-165)/(44-62) 157/47 mmHg (05/06 1200) SpO2:  [94 %-100 %] 98 % (05/06 1200) FiO2 (%):  [40 %] 40 % (05/06 1200) Weight:  [97.2 kg (214 lb 4.6 oz)] 97.2 kg (214 lb 4.6 oz) (05/06 0343) HEMODYNAMICS:   VENTILATOR SETTINGS: Vent Mode:  [-] PSV;CPAP FiO2 (%):  [40 %] 40 % Set Rate:  [16 bmp] 16 bmp Vt Set:  [440 mL] 440 mL PEEP:  [5 cmH20] 5 cmH20 Pressure Support:  [5 cmH20-8 cmH20] 5 cmH20 Plateau Pressure:  [11 cmH20-14 cmH20] 14 cmH20   INTAKE / OUTPUT: Intake/Output      05/05 0701 - 05/06 0700 05/06 0701 - 05/07 0700   I.V. (mL/kg) 260 (2.7) 40 (0.4)   NG/GT 2070 165   IV Piggyback 210 105   Total Intake(mL/kg) 2540 (26.1) 310 (3.2)   Urine (mL/kg/hr) 2250 (1) 750 (1.4)   Stool 0 (0) 0 (0)   Total Output 2250 750   Net +290 -440        Stool Occurrence 1 x 1 x    PHYSICAL EXAMINATION: General:  Acutely ill, intubated, follows commands on LUE only.. Neuro: RASS -1, R hemiplegia improving, follows commands on the LUE HEENT: Surgical dressing in place. Cardiovascular:  Reg, no M/R/G. Lungs: clear anteriorly. Abdomen:  Obese, soft, +BS Ext: warm, no edema  LABS:  PULMONARY  Recent Labs Lab  05/23/14 0408 05/26/14 0320 05/28/14 0444  PHART 7.397 7.412 7.437  PCO2ART 45.8* 45.2* 44.4  PO2ART 85.3 72.0* 152*  HCO3 27.6* 28.6* 29.4*  TCO2 29.0 30 30.7  O2SAT 96.4 94.0 98.7   CBC  Recent Labs Lab 05/26/14 0330 05/27/14 0600 05/28/14 0425  HGB 10.5* 10.7* 10.3*  HCT 35.0* 35.1* 33.8*  WBC 12.2* 13.4* 12.2*  PLT 271 278 273   COAGULATION No results for input(s): INR in the last 168 hours. CARDIAC  No results for input(s): TROPONINI in the last 168 hours. No results for input(s): PROBNP in the last 168  hours.  CHEMISTRY  Recent Labs Lab 05/23/14 0350 05/24/14 0400 05/25/14 0530 05/26/14 0330 05/27/14 0600 05/28/14 0425  NA 143 146* 146* 144 143 140  K 4.0 3.7 3.7 3.5 3.8 3.6  CL 109 108 106 105 104 101  CO2 27 30 28 29 31 29   GLUCOSE 150* 133* 136* 115* 129* 119*  BUN 19 19 19 19 19 18   CREATININE 0.56 0.52 0.52 0.55 0.56 0.50  CALCIUM 8.8* 8.6* 9.1 9.1 9.0 8.9  MG 2.1 2.0  --  2.2 2.1 2.1  PHOS 2.7 3.1  --  4.4 3.7 3.8   Estimated Creatinine Clearance: 76.2 mL/min (by C-G formula based on Cr of 0.5).  LIVER No results for input(s): AST, ALT, ALKPHOS, BILITOT, PROT, ALBUMIN, INR in the last 168 hours. INFECTIOUS No results for input(s): LATICACIDVEN, PROCALCITON in the last 168 hours.  ENDOCRINE CBG (last 3)   Recent Labs  05/28/14 0338 05/28/14 0809 05/28/14 1203  GLUCAP 117* 119* 128*   IMAGING x48h Mr Maxine Glenn Head Wo Contrast  05/26/2014   CLINICAL DATA:  Craniotomy for intracranial hemorrhage an ischemic infarcts.  EXAM: MRI HEAD WITHOUT AND WITH CONTRAST  MRA HEAD WITHOUT CONTRAST  MRA NECK WITHOUT AND WITH CONTRAST  TECHNIQUE: Multiplanar, multiecho pulse sequences of the brain and surrounding structures were obtained without and with intravenous contrast. Angiographic images of the Circle of Willis were obtained using MRA technique without intravenous contrast. Angiographic images of the neck were obtained using MRA technique without and with intravenous contrast. Carotid stenosis measurements (when applicable) are obtained utilizing NASCET criteria, using the distal internal carotid diameter as the denominator.  CONTRAST:  20mL MULTIHANCE GADOBENATE DIMEGLUMINE 529 MG/ML IV SOLN  COMPARISON:  CT of the head 5/4/6 stain, 05/23/2014, and 05/21/2014.  FINDINGS: MRI HEAD FINDINGS  A left frontoparietal craniotomy is noted with evacuation of the left-sided hemorrhage. The area of residual hemorrhage is similar to the prior studies, measuring 3.4 x 5.9 x 3.2 cm. There is  surrounding vasogenic edema. There is also enhancement surrounding this hemorrhage.  The postcontrast images demonstrate intravascular enhancement throughout the posterior temporal lobes, occipital lobes, and bilateral parietal lobes, left greater than right.  Areas of acute nonhemorrhagic infarction are present within the posterior left temporal lobe and bilateral occipital lobes, left greater than right. A punctate focus is present within the medial posterior left frontal lobe. Acute nonhemorrhagic infarct is present at the left globus pallidus.  The residual hemorrhage continues to produce some mass effect on the left lateral ventricle. There is 3 mm of residual left-to-right midline shift. Layering blood products are noted within the lateral ventricles bilaterally without hydrocephalus.  Subarachnoid blood is again noted over the right temporal convexity. T2 changes of the CSF near the vertex bilaterally may be related to blood products. This can be seen in the setting of high oxygen tension as well, more likely in  this case.  Flow is present in the major intracranial arteries. The globes and orbits are intact. The paranasal sinuses are clear. There is some fluid in the right mastoid air cells. No obstructing nasopharyngeal lesion is present.  Midline structures are otherwise within normal limits.  MRA HEAD FINDINGS  Internal carotid arteries demonstrate mild atherosclerotic irregularity within the cavernous segments bilaterally, worse on the left. There is no significant stenosis. The terminal ICA is normal bilaterally. There is mild narrowing of the A1 segments bilaterally without a significant stenosis. The anterior communicating artery is patent. The M1 segments are normal. The MCA bifurcations are unremarkable. There is mild attenuation of MCA branch vessels bilaterally.  More moderate segmental attenuation is present within both vertebral arteries. The right vertebral artery essentially bifurcates at the  PICA. The vertebrobasilar junction is normal. The basilar artery is small. The right posterior cerebral artery is of fetal type. The left posterior cerebral artery originates from the basilar tip. There is moderate attenuation of the PCA branch vessels bilaterally.  MRA NECK FINDINGS  The time-of-flight images demonstrate narrowing of the left internal carotid artery. There is no significant flow disturbance at the right carotid bifurcation. Flow is antegrade in the vertebral arteries bilaterally.  Both vertebral arteries originate from the subclavian arteries. There is a moderate to high-grade stenosis of proximal right vertebral artery. No other significant stenoses are evident. Postcontrast imaging of the vertebrobasilar junction appears more normal than the time-of-flight images.  The right common carotid artery is within normal limits. There is focal plaque at the right carotid bifurcation without a significant stenosis. Mild tortuosity is present in the cervical right ICA without significant stenosis.  The left common carotid artery is within normal limits. Moderate stenosis is present in the proximal right internal carotid artery with an associated ulcerative plaque. The lumen is narrowed to 1.8 mm this compares with a more normal distal caliber of 4.1 mm.  IMPRESSION: 1. Stable appearance of residual left frontal and parietal hemorrhage following the craniotomy. 2. Areas of acute/subacute nonhemorrhagic infarct within the occipital lobes bilaterally, the posterior left temporal lobe, and the left globus pallidus. 3. 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 The configuration suggests this could be an on stable plaque. 4. Moderate to high-grade stenosis of the proximal right vertebral artery without a significant distal stenosis. 5. Mild atherosclerotic changes at the right carotid bifurcation and bilateral cavernous internal carotid arteries without other  stenoses. 6. Subarachnoid hemorrhage over the right temporal lobe. 7. Intravascular enhancement in the posterior circulation territories suggests luxury perfusion or less likely meningitis. 8. Nonspecific enhancement surrounding the area of hemorrhage likely related to the recent surgery.   Electronically Signed   By: Marin Robertshristopher  Mattern M.D.   On: 05/26/2014 13:53   Mr Angiogram Neck W Wo Contrast  05/26/2014   CLINICAL DATA:  Craniotomy for intracranial hemorrhage an ischemic infarcts.  EXAM: MRI HEAD WITHOUT AND WITH CONTRAST  MRA HEAD WITHOUT CONTRAST  MRA NECK WITHOUT AND WITH CONTRAST  TECHNIQUE: Multiplanar, multiecho pulse sequences of the brain and surrounding structures were obtained without and with intravenous contrast. Angiographic images of the Circle of Willis were obtained using MRA technique without intravenous contrast. Angiographic images of the neck were obtained using MRA technique without and with intravenous contrast. Carotid stenosis measurements (when applicable) are obtained utilizing NASCET criteria, using the distal internal carotid diameter as the denominator.  CONTRAST:  20mL MULTIHANCE GADOBENATE DIMEGLUMINE 529 MG/ML IV SOLN  COMPARISON:  CT of the head 5/4/6 stain, 05/23/2014, and 05/21/2014.  FINDINGS: MRI HEAD FINDINGS  A left frontoparietal craniotomy is noted with evacuation of the left-sided hemorrhage. The area of residual hemorrhage is similar to the prior studies, measuring 3.4 x 5.9 x 3.2 cm. There is surrounding vasogenic edema. There is also enhancement surrounding this hemorrhage.  The postcontrast images demonstrate intravascular enhancement throughout the posterior temporal lobes, occipital lobes, and bilateral parietal lobes, left greater than right.  Areas of acute nonhemorrhagic infarction are present within the posterior left temporal lobe and bilateral occipital lobes, left greater than right. A punctate focus is present within the medial posterior left frontal  lobe. Acute nonhemorrhagic infarct is present at the left globus pallidus.  The residual hemorrhage continues to produce some mass effect on the left lateral ventricle. There is 3 mm of residual left-to-right midline shift. Layering blood products are noted within the lateral ventricles bilaterally without hydrocephalus.  Subarachnoid blood is again noted over the right temporal convexity. T2 changes of the CSF near the vertex bilaterally may be related to blood products. This can be seen in the setting of high oxygen tension as well, more likely in this case.  Flow is present in the major intracranial arteries. The globes and orbits are intact. The paranasal sinuses are clear. There is some fluid in the right mastoid air cells. No obstructing nasopharyngeal lesion is present.  Midline structures are otherwise within normal limits.  MRA HEAD FINDINGS  Internal carotid arteries demonstrate mild atherosclerotic irregularity within the cavernous segments bilaterally, worse on the left. There is no significant stenosis. The terminal ICA is normal bilaterally. There is mild narrowing of the A1 segments bilaterally without a significant stenosis. The anterior communicating artery is patent. The M1 segments are normal. The MCA bifurcations are unremarkable. There is mild attenuation of MCA branch vessels bilaterally.  More moderate segmental attenuation is present within both vertebral arteries. The right vertebral artery essentially bifurcates at the PICA. The vertebrobasilar junction is normal. The basilar artery is small. The right posterior cerebral artery is of fetal type. The left posterior cerebral artery originates from the basilar tip. There is moderate attenuation of the PCA branch vessels bilaterally.  MRA NECK FINDINGS  The time-of-flight images demonstrate narrowing of the left internal carotid artery. There is no significant flow disturbance at the right carotid bifurcation. Flow is antegrade in the vertebral  arteries bilaterally.  Both vertebral arteries originate from the subclavian arteries. There is a moderate to high-grade stenosis of proximal right vertebral artery. No other significant stenoses are evident. Postcontrast imaging of the vertebrobasilar junction appears more normal than the time-of-flight images.  The right common carotid artery is within normal limits. There is focal plaque at the right carotid bifurcation without a significant stenosis. Mild tortuosity is present in the cervical right ICA without significant stenosis.  The left common carotid artery is within normal limits. Moderate stenosis is present in the proximal right internal carotid artery with an associated ulcerative plaque. The lumen is narrowed to 1.8 mm this compares with a more normal distal caliber of 4.1 mm.  IMPRESSION: 1. Stable appearance of residual left frontal and parietal hemorrhage following the craniotomy. 2. Areas of acute/subacute nonhemorrhagic infarct within the occipital lobes bilaterally, the posterior left temporal lobe, and the left globus pallidus. 3. 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 The configuration suggests this could be an on stable plaque. 4. Moderate to high-grade stenosis  of the proximal right vertebral artery without a significant distal stenosis. 5. Mild atherosclerotic changes at the right carotid bifurcation and bilateral cavernous internal carotid arteries without other stenoses. 6. Subarachnoid hemorrhage over the right temporal lobe. 7. Intravascular enhancement in the posterior circulation territories suggests luxury perfusion or less likely meningitis. 8. Nonspecific enhancement surrounding the area of hemorrhage likely related to the recent surgery.   Electronically Signed   By: Marin Roberts M.D.   On: 05/26/2014 13:53   Mr Laqueta Jean ZO Contrast  05/26/2014   CLINICAL DATA:  Craniotomy for intracranial hemorrhage an ischemic  infarcts.  EXAM: MRI HEAD WITHOUT AND WITH CONTRAST  MRA HEAD WITHOUT CONTRAST  MRA NECK WITHOUT AND WITH CONTRAST  TECHNIQUE: Multiplanar, multiecho pulse sequences of the brain and surrounding structures were obtained without and with intravenous contrast. Angiographic images of the Circle of Willis were obtained using MRA technique without intravenous contrast. Angiographic images of the neck were obtained using MRA technique without and with intravenous contrast. Carotid stenosis measurements (when applicable) are obtained utilizing NASCET criteria, using the distal internal carotid diameter as the denominator.  CONTRAST:  20mL MULTIHANCE GADOBENATE DIMEGLUMINE 529 MG/ML IV SOLN  COMPARISON:  CT of the head 5/4/6 stain, 05/23/2014, and 05/21/2014.  FINDINGS: MRI HEAD FINDINGS  A left frontoparietal craniotomy is noted with evacuation of the left-sided hemorrhage. The area of residual hemorrhage is similar to the prior studies, measuring 3.4 x 5.9 x 3.2 cm. There is surrounding vasogenic edema. There is also enhancement surrounding this hemorrhage.  The postcontrast images demonstrate intravascular enhancement throughout the posterior temporal lobes, occipital lobes, and bilateral parietal lobes, left greater than right.  Areas of acute nonhemorrhagic infarction are present within the posterior left temporal lobe and bilateral occipital lobes, left greater than right. A punctate focus is present within the medial posterior left frontal lobe. Acute nonhemorrhagic infarct is present at the left globus pallidus.  The residual hemorrhage continues to produce some mass effect on the left lateral ventricle. There is 3 mm of residual left-to-right midline shift. Layering blood products are noted within the lateral ventricles bilaterally without hydrocephalus.  Subarachnoid blood is again noted over the right temporal convexity. T2 changes of the CSF near the vertex bilaterally may be related to blood products. This can  be seen in the setting of high oxygen tension as well, more likely in this case.  Flow is present in the major intracranial arteries. The globes and orbits are intact. The paranasal sinuses are clear. There is some fluid in the right mastoid air cells. No obstructing nasopharyngeal lesion is present.  Midline structures are otherwise within normal limits.  MRA HEAD FINDINGS  Internal carotid arteries demonstrate mild atherosclerotic irregularity within the cavernous segments bilaterally, worse on the left. There is no significant stenosis. The terminal ICA is normal bilaterally. There is mild narrowing of the A1 segments bilaterally without a significant stenosis. The anterior communicating artery is patent. The M1 segments are normal. The MCA bifurcations are unremarkable. There is mild attenuation of MCA branch vessels bilaterally.  More moderate segmental attenuation is present within both vertebral arteries. The right vertebral artery essentially bifurcates at the PICA. The vertebrobasilar junction is normal. The basilar artery is small. The right posterior cerebral artery is of fetal type. The left posterior cerebral artery originates from the basilar tip. There is moderate attenuation of the PCA branch vessels bilaterally.  MRA NECK FINDINGS  The time-of-flight images demonstrate narrowing of the left internal carotid artery. There  is no significant flow disturbance at the right carotid bifurcation. Flow is antegrade in the vertebral arteries bilaterally.  Both vertebral arteries originate from the subclavian arteries. There is a moderate to high-grade stenosis of proximal right vertebral artery. No other significant stenoses are evident. Postcontrast imaging of the vertebrobasilar junction appears more normal than the time-of-flight images.  The right common carotid artery is within normal limits. There is focal plaque at the right carotid bifurcation without a significant stenosis. Mild tortuosity is present  in the cervical right ICA without significant stenosis.  The left common carotid artery is within normal limits. Moderate stenosis is present in the proximal right internal carotid artery with an associated ulcerative plaque. The lumen is narrowed to 1.8 mm this compares with a more normal distal caliber of 4.1 mm.  IMPRESSION: 1. Stable appearance of residual left frontal and parietal hemorrhage following the craniotomy. 2. Areas of acute/subacute nonhemorrhagic infarct within the occipital lobes bilaterally, the posterior left temporal lobe, and the left globus pallidus. 3. 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 The configuration suggests this could be an on stable plaque. 4. Moderate to high-grade stenosis of the proximal right vertebral artery without a significant distal stenosis. 5. Mild atherosclerotic changes at the right carotid bifurcation and bilateral cavernous internal carotid arteries without other stenoses. 6. Subarachnoid hemorrhage over the right temporal lobe. 7. Intravascular enhancement in the posterior circulation territories suggests luxury perfusion or less likely meningitis. 8. Nonspecific enhancement surrounding the area of hemorrhage likely related to the recent surgery.   Electronically Signed   By: Marin Roberts M.D.   On: 05/26/2014 13:53   Dg Chest Port 1 View  05/28/2014   CLINICAL DATA:  Intubation .  EXAM: PORTABLE CHEST - 1 VIEW  COMPARISON:  05/27/2014.  FINDINGS: Endotracheal tube and NG tube in good anatomic position. Left PICC line in stable position. Mediastinum and hilar structures are stable. Heart size stable. Low lung volumes with mild bibasilar atelectasis. No change. Small left pleural effusion cannot be excluded. No pneumothorax.  IMPRESSION: 1. Lines and tubes in stable position. 2. Persistent low lung volumes with mild stable subsegmental atelectasis and/or infiltrates. Small left pleural effusion cannot  be excluded. No pneumothorax.   Electronically Signed   By: Maisie Fus  Register   On: 05/28/2014 07:06   Dg Chest Port 1 View  05/27/2014   CLINICAL DATA:  Intubation.  EXAM: PORTABLE CHEST - 1 VIEW  COMPARISON:  05/26/2014.  FINDINGS: Endotracheal tube, left PICC line, NG tube in stable position. Mediastinum hilar structures are stable. Low lung volumes with mild bibasilar atelectasis and/or infiltrates. No pleural effusion or pneumothorax.  IMPRESSION: 1. Lines and tubes in stable position. 2. Low lung volumes with state mild bibasilar subsegmental atelectasis and/or infiltrates.   Electronically Signed   By: Maisie Fus  Register   On: 05/27/2014 07:04   ASSESSMENT / PLAN:  PULMONARY ASSESSMENT: Acute respiratory failure due to CVA, AMS  - doing SBT but mental status precludes extubation  PLAN:   Continue PS trials but no extubation given mental status. Cont vent bundle. Titrate O2 for sat of 88-92%. Family to decide trach vs extubation on Sunday.  CARDIOVASCULAR  ASSESSMENT:  Hypertensive emergency AAA s/p recent stent grafting  Hypotensive and junctional brady with sinus pauses  PLAN:  Dced dopamine SBP goal < 140 mmHg PRNs in place.  RENAL  ASSESSMENT:  Nil acute   PLAN:   Monitor BMET intermittently Monitor I/Os  Correct electrolytes as indicated Adjust free water for Na levels  GASTROINTESTINAL  ASSESSMENT:  No acute issues  PLAN:   SUP: enteral PPI Continue TFs  Cont free water.  HEMATOLOGIC  ASSESSMENT:   Plavix, ASA prior to admission (indication: recent AAA stent)  Received plt transfusion in ED PLAN:  DVT px: SCDs. Monitor CBC intermittently. Transfuse per usual ICU guidelines.  INFECTIOUS  ASSESSMENT: No evidence of infection PLAN:   Monitor temp, WBC count. Micro and abx as above.  ENDOCRINE  ASSESSMENT:   Hypothyroidism (L-thyroxine 75 mcg daily PTA) Hyperglycemia without prior DM history  PLAN:   Cont  L-thyroxine SSI  NEUROLOGIC  ASSESSMENT:   Hypertensive [facility] S/P craniotomy 4/28 Post op pain ICU associated discomfort  PLAN:   RASS goal: 0 Fentanyl  Intermittent. D/C versed. Avoid Precedex  - caused significant bradycardia and hypotension. Daily WUA Post op mgmt per NS Further stroke eval per Stroke team  GLOBA 05/28/14: Spoke with daughter they would like to wait until Sunday to make decision regarding trach/peg.  Informed them that she will likely come off the vent.  The patient is critically ill with multiple organ systems failure and requires high complexity decision making for assessment and support, frequent evaluation and titration of therapies, application of advanced monitoring technologies and extensive interpretation of multiple databases.   Critical Care Time devoted to patient care services described in this note is  35  Minutes. This time reflects time of care of this signee Dr Koren BoundWesam Denita Lun. This critical care time does not reflect procedure time, or teaching time or supervisory time of PA/NP/Med student/Med Resident etc but could involve care discussion time.  Alyson ReedyWesam G. Emira Eubanks, M.D. [facility]. Pager: [phone]. After hours pager: [phone].  05/28/2014 12:24 PM

## 2014-05-28 NOTE — Progress Notes (Signed)
UR completed.  Pt remains on vent. Family discussing options and will notify MD on decisions re: trach this weekend.   Carlyle LipaMichelle Dyani Babel, RN BSN MHA CCM Trauma/Neuro ICU Case Manager [phone]

## 2014-05-28 NOTE — Progress Notes (Signed)
Patient ID: Carolyn [name], female   DOB: [birth year], 69 y.o.   MRN: [id number] Afeb, vss No new neuro issues Still tracking well. Will see how she does over the weekend.

## 2014-05-28 NOTE — Progress Notes (Signed)
OGT appeared to be positioned too far out. Tube feeds turned off. When auscultated, it was not heard in the stomach, but heard in the throat. OGT advanced, MD notified and KUB ordered. Tube feeds to be held until confirmation.

## 2014-05-28 NOTE — Progress Notes (Signed)
STROKE TEAM PROGRESS NOTE   HISTORY Carolyn [name] is a 69 y.o. female brought to hospital as code stroke. EMS was called to her work and told she was noted to have right arm weakness then noted to complain of severe HA which then was followed by sudden unconsciousness. LKW 05/20/2014 at 1240. On arrival she was unconscious on NRB but not protectin g airway well. CT head was obtained and showed a large left parietal ICH. While in ED patient has one episode emesis and intubated for air way protection. NIH stroke score was 31. Blood pressure was markedly elevated, and at one point was 241/123. Emergency management of hypertension was undertaken with use of Cardene intravenously. She was admitted to the neuro ICU for further evaluation and treatment.   SUBJECTIVE (INTERVAL HISTORY)  She is alert today and tries to open eyes but remains aphasic and not following commands and dense Rt hemiplegia. Blood pressure adequately controlled. Daughter and son Carolyn [name] at bedside. Long 15 min discussion with then about goals of care, trach/PEG  And SNF versus comfort care about prognosis and plan of care. Family is yet undecided and needs time to consider treatment options over the weekend OBJECTIVE Temp:  [98.4 F (36.9 C)-99.5 F (37.5 C)] 98.4 F (36.9 C) (05/06 1543) Pulse Rate:  [68-102] 86 (05/06 1550) Cardiac Rhythm:  [-] Normal sinus rhythm (05/06 1200) Resp:  [13-26] 17 (05/06 1550) BP: (130-165)/(42-62) 148/52 mmHg (05/06 1550) SpO2:  [94 %-100 %] 97 % (05/06 1550) FiO2 (%):  [40 %] 40 % (05/06 1550) Weight:  [214 lb 4.6 oz (97.2 kg)] 214 lb 4.6 oz (97.2 kg) (05/06 0343)   Recent Labs Lab 05/27/14 1957 05/27/14 2324 05/28/14 0338 05/28/14 0809 05/28/14 1203  GLUCAP 108* 134* 117* 119* 128*    Recent Labs Lab 05/23/14 0350 05/24/14 0400 05/25/14 0530 05/26/14 0330 05/27/14 0600 05/28/14 0425  NA 143 146* 146* 144 143 140  K 4.0 3.7 3.7 3.5 3.8 3.6  CL 109 108 106 105 104 101   CO2 GLUCOSE 150* 133* 136* 115* 129* 119*  BUN CREATININE 0.56 0.52 0.52 0.55 0.56 0.50  CALCIUM 8.8* 8.6* 9.1 9.1 9.0 8.9  MG 2.1 2.0  --  2.2 2.1 2.1  PHOS 2.7 3.1  --  4.4 3.7 3.8   No results for input(s): AST, ALT, ALKPHOS, BILITOT, PROT, ALBUMIN in the last 168 hours.  Recent Labs Lab 05/24/14 0400 05/25/14 0530 05/26/14 0330 05/27/14 0600 05/28/14 0425  WBC 11.8* 12.0* 12.2* 13.4* 12.2*  NEUTROABS  --   --   --  9.8* 8.7*  HGB 10.1* 10.8* 10.5* 10.7* 10.3*  HCT 32.9* 36.5 35.0* 35.1* 33.8*  MCV 88.7 88.4 88.4 89.3 87.1  PLT 268 284 271 278 273   No results for input(s): CKTOTAL, CKMB, CKMBINDEX, TROPONINI in the last 168 hours. No results for input(s): LABPROT, INR in the last 72 hours. No results for input(s): COLORURINE, LABSPEC, PHURINE, GLUCOSEU, HGBUR, BILIRUBINUR, KETONESUR, PROTEINUR, UROBILINOGEN, NITRITE, LEUKOCYTESUR in the last 72 hours.  Invalid input(s): APPERANCEUR     Component Value Date/Time   CHOL 242* 05/21/2014 1048   TRIG 181* 05/24/2014 0400   HDL 61 05/21/2014 1048   CHOLHDL 4.0 05/21/2014 1048   VLDL 49* 05/21/2014 1048   LDLCALC 132* 05/21/2014 1048   Lab Results  Component Value Date   HGBA1C 5.9* 05/21/2014      Component Value Date/Time  LABOPIA NONE DETECTED 05/20/2014 1513   COCAINSCRNUR NONE DETECTED 05/20/2014 1513   LABBENZ NONE DETECTED 05/20/2014 1513   AMPHETMU NONE DETECTED 05/20/2014 1513   THCU NONE DETECTED 05/20/2014 1513   LABBARB NONE DETECTED 05/20/2014 1513    No results for input(s): ETH in the last 168 hours.  Ct Head Wo Contrast  05/21/2014   IMPRESSION: 1. Interval decompression of the left-sided cerebral hemorrhage, with subdural drain in place along the craniotomy site. The hematoma is significantly reduced in volume. There is gas in the center of the hematoma and a small amount of gas and blood products in the extra-axial space on the left. 2. Intraventricular  hemorrhage in both lateral ventricles and in the third and fourth ventricle, without hydrocephalus. 3. Faintly increased density along the right temporal lobe is compatible with a small amount of new subarachnoid hemorrhage along the right temporal lobe. 4. 3 mm of left-to-right midline shift.     05/20/2014   IMPRESSION: Large left frontal parietal intracerebral hemorrhage with a 7.5 mm of midline shift. The hemorrhage has extended into the left lateral ventricle.    Dg Chest Port 1 View  05/21/2014    IMPRESSION: NG tube placed.  Stable bibasilar atelectasis.  No evidence of pulmonary edema.     05/20/2014   IMPRESSION: Endotracheal tube tip at the carina with the tip directed toward the right main bronchus. No edema or consolidation. No pneumothorax.    2D echo -  estimated ejection fraction was in the range of 65% to 70%   PHYSICAL EXAM  General - Well nourished, well developed elderly Caucasian lady, intubated   Ophthalmologic - fundi not visualized due to incorporation.  Cardiovascular - Regular rate and rhythm with no murmur.  Neuro - intubated .Awake.  opening eyes partially on voice  And with stimulation and not following commands.    PERRL, pupil 3mm reactive to light, doll's eye present, corneal reflex and gag and cough reflex weakly positive. Breathing over the vent. Spontaneously moving LUE and LLE, but 0/5 RUE on pain and trace withdraw on RLE on pain stimulation, no babinski bilaterally. Reflex 1+.    ASSESSMENT/PLAN [name] is a 69 y.o. female with history of hypertension presenting with right arm weakness, severe headache, and subsequent unconsciousness.  She did not receive IV t-PA due to a large left frontal parietal intracerebral hemorrhage.  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  Repeat CT scans show new interval left parieto-occipital and basal ganglia infarcts raising concern for the initial hematoma being hemorrhagic  transformation into a embolic stroke and STROKES brain embolic from proximal left ICA stenosis with ulcerated plaque  Resultant  Intubated and right hemiplegia  Slow to wake up  Repeat CT showed smaller hematoma and decreased midline shift2SCDs for VTE prophylaxis, will start lovenox for VTE prophylaxis   NPO  aspirin 81 mg orally every day and clopidogrel 75 mg orally every day prior to admission, now on no antithrombotic due to hemorrhage  Ongoing aggressive stroke risk factor management    EEG 05/24/14 Abnormal EEG due to generalized slowing indicating a mild to moderate cerebral disturbance (encephalopathy). Focal low voltage slowing is noted in the left hemisphere indicating a focal cerebral disturbance. No seizure activity noted  Therapy recommendations: Pending  Disposition: Pending  Cerebral edema  Midline shift improved s/p hematoma evacuation  Repeat CT no evidence of hydro   Will hold off hypertonic saline.   Neuro check  Hypertension  Home meds:  Toprol-XL  Consider adding on anti hypertensives to titrate down clevedipine.   Unstable  Put on po BP meds and  off drip  BP goal < 180  Close monitoring  On fentanyl.  Bradycardia and pauses at night. On dopamine gtt. Now off Cardiology feel it is vagally mediated   Hyperlipidemia  LDL 132  Other Stroke Risk Factors  Advanced age  Obesity, Body mass index is 36.76 kg/(m^2).   Other Active Problems  Intubated  Hyperkalemia  hyperglycemia  Mild anemia  Other Pertinent History  History of abdominal aortic aneurysm repair  Hospital day # 8 I have personally examined this patient, reviewed notes, independently viewed imaging studies, participated in medical decision making and plan of care. I have made any additions or clarifications directly to the above note.   I had a long discussion at the bedside with the patient's daughter and son regarding her neurological condition, prognosis and answered  questions.  She is likely going to survive with major neurological deficit and is going to need 24-hour care and likely stay in a nursing home. She will need tracheostomy and PEG tube to facilitate nursing and medical care for her. Family to decide soon if they want to go this route or consider palliative care The daughter and  and make a decision over the next few days whether they want to pursue this. This patient is critically ill and at significant risk of neurological worsening, death and care requires constant monitoring of vital signs, hemodynamics,respiratory and cardiac monitoring,review of multiple databases, neurological assessment, discussion with family, other specialists and medical decision making of high complexity.I have made any additions or clarifications directly to the above note.  I spent 30 minutes of neurocritical care time  in the care of  this patient.  Delia HeadyPramod Raybon Conard, MD Medical Director [facility] Pager: [phone] 05/28/2014 3:59 PM   To contact Stroke Continuity provider, please refer to [email or web address]. After hours, contact General Neurology

## 2014-05-29 ENCOUNTER — Inpatient Hospital Stay (HOSPITAL_COMMUNITY): Payer: BLUE CROSS/BLUE SHIELD

## 2014-05-29 DIAGNOSIS — Z789 Other specified health status: Secondary | ICD-10-CM

## 2014-05-29 DIAGNOSIS — Z978 Presence of other specified devices: Secondary | ICD-10-CM | POA: Insufficient documentation

## 2014-05-29 DIAGNOSIS — I639 Cerebral infarction, unspecified: Secondary | ICD-10-CM | POA: Insufficient documentation

## 2014-05-29 LAB — BLOOD GAS, ARTERIAL
Acid-Base Excess: 4.7 mmol/L — ABNORMAL HIGH (ref 0.0–2.0)
Bicarbonate: 29 mEq/L — ABNORMAL HIGH (ref 20.0–24.0)
DRAWN BY: 365271
FIO2: 0.4 %
MECHVT: 440 mL
O2 Saturation: 97.9 %
PCO2 ART: 45 mmHg (ref 35.0–45.0)
PEEP: 5 cmH2O
PO2 ART: 121 mmHg — AB (ref 80.0–100.0)
Patient temperature: 98.6
RATE: 16 resp/min
TCO2: 30.3 mmol/L (ref 0–100)
pH, Arterial: 7.425 (ref 7.350–7.450)

## 2014-05-29 LAB — BASIC METABOLIC PANEL
Anion gap: 7 (ref 5–15)
BUN: 20 mg/dL (ref 6–20)
CHLORIDE: 100 mmol/L — AB (ref 101–111)
CO2: 30 mmol/L (ref 22–32)
Calcium: 9.1 mg/dL (ref 8.9–10.3)
Creatinine, Ser: 0.57 mg/dL (ref 0.44–1.00)
GFR calc Af Amer: >60 mL/min (ref >60)
GFR calc non Af Amer: >60 mL/min (ref >60)
GLUCOSE: 118 mg/dL — AB (ref 70–99)
POTASSIUM: 3.8 mmol/L (ref 3.5–5.1)
SODIUM: 137 mmol/L (ref 135–145)

## 2014-05-29 LAB — CBC WITH DIFFERENTIAL/PLATELET
BASOS ABS: 0 10*3/uL (ref 0.0–0.1)
Basophils Relative: 0 % (ref 0–1)
EOS PCT: 3 % (ref 0–5)
Eosinophils Absolute: 0.4 10*3/uL (ref 0.0–0.7)
HCT: 34.2 % — ABNORMAL LOW (ref 36.0–46.0)
HEMOGLOBIN: 10.6 g/dL — AB (ref 12.0–15.0)
Lymphocytes Relative: 17 % (ref 12–46)
Lymphs Abs: 2.2 10*3/uL (ref 0.7–4.0)
MCH: 26.8 pg (ref 26.0–34.0)
MCHC: 31 g/dL (ref 30.0–36.0)
MCV: 86.6 fL (ref 78.0–100.0)
MONO ABS: 1.1 10*3/uL — AB (ref 0.1–1.0)
Monocytes Relative: 8 % (ref 3–12)
NEUTROS ABS: 9.3 10*3/uL — AB (ref 1.7–7.7)
Neutrophils Relative %: 72 % (ref 43–77)
Platelets: 306 10*3/uL (ref 150–400)
RBC: 3.95 MIL/uL (ref 3.87–5.11)
RDW: 15 % (ref 11.5–15.5)
WBC: 13 10*3/uL — ABNORMAL HIGH (ref 4.0–10.5)

## 2014-05-29 LAB — GLUCOSE, CAPILLARY
GLUCOSE-CAPILLARY: 128 mg/dL — AB (ref 70–99)
Glucose-Capillary: 117 mg/dL — ABNORMAL HIGH (ref 70–99)
Glucose-Capillary: 147 mg/dL — ABNORMAL HIGH (ref 70–99)
Glucose-Capillary: 119 mg/dL — ABNORMAL HIGH (ref 70–99)
Glucose-Capillary: 109 mg/dL — ABNORMAL HIGH (ref 70–99)

## 2014-05-29 LAB — MAGNESIUM: MAGNESIUM: 2.1 mg/dL (ref 1.7–2.4)

## 2014-05-29 LAB — PHOSPHORUS: PHOSPHORUS: 4 mg/dL (ref 2.5–4.6)

## 2014-05-29 MED ORDER — LEVETIRACETAM 100 MG/ML PO SOLN
500.0000 mg | Freq: Two times a day (BID) | ORAL | Status: DC
  Administered 2014-05-29 – 2014-05-30 (×3): 500 mg
  Filled 2014-05-29 (×4): qty 5

## 2014-05-29 NOTE — Progress Notes (Signed)
Patient ID: [name] CourtGeraldine Shinsky, female   DOB: 1945/06/20, 69 y.o.   MRN: [id number] Subjective:  The patient is alert and attentive. She appears a phasic. She is in no apparent distress. She remains intubated.  Objective: Vital signs in last 24 hours: Temp:  [98.4 F (36.9 C)-98.8 F (37.1 C)] 98.8 F (37.1 C) (05/07 0800) Pulse Rate:  [69-102] 79 (05/07 0802) Resp:  [14-26] 14 (05/07 0800) BP: (115-165)/(37-76) 150/52 mmHg (05/07 0802) SpO2:  [97 %-100 %] 99 % (05/07 0800) FiO2 (%):  [40 %] 40 % (05/07 0802) Weight:  [95.6 kg (210 lb 12.2 oz)] 95.6 kg (210 lb 12.2 oz) (05/07 0421)  Intake/Output from previous day: 05/06 0701 - 05/07 0700 In: 1750 [I.V.:270; NG/GT:1270; IV Piggyback:210] Out: 2395 [Urine:2395] Intake/Output this shift: Total I/O In: 65 [I.V.:10; NG/GT:55] Out: 70 [Urine:70]  Physical exam Glasgow Coma Scale 10 intubated, [id number]. She is right hemiplegic. Her wound is healing well. She doesn't quite follow commands. She appears a phasic.  Lab Results:  Recent Labs  05/28/14 0425 05/29/14 0510  WBC 12.2* 13.0*  HGB 10.3* 10.6*  HCT 33.8* 34.2*  PLT 273 306   BMET  Recent Labs  05/28/14 0425 05/29/14 0510  NA 140 137  K 3.6 3.8  CL 101 100*  CO2 29 30  GLUCOSE 119* 118*  BUN 18 20  CREATININE 0.50 0.57  CALCIUM 8.9 9.1    Studies/Results: Dg Chest Port 1 View  05/29/2014   CLINICAL DATA:  Acute respiratory failure  EXAM: PORTABLE CHEST - 1 VIEW  COMPARISON:  05/28/2014  FINDINGS: The endotracheal tube tip is 4.3 cm above the carina. The nasogastric tube extends into the stomach. There is a left upper extremity PICC line with tip in the SVC. The lungs are clear. There is no pneumothorax. There is no large effusion.  IMPRESSION: Support equipment appears satisfactorily positioned.  The lungs are grossly clear   Electronically Signed   By: Ellery Plunkaniel R Mitchell M.D.   On: 05/29/2014 05:46   Dg Chest Port 1 View  05/28/2014   CLINICAL DATA:  Intubation .   EXAM: PORTABLE CHEST - 1 VIEW  COMPARISON:  05/27/2014.  FINDINGS: Endotracheal tube and NG tube in good anatomic position. Left PICC line in stable position. Mediastinum and hilar structures are stable. Heart size stable. Low lung volumes with mild bibasilar atelectasis. No change. Small left pleural effusion cannot be excluded. No pneumothorax.  IMPRESSION: 1. Lines and tubes in stable position. 2. Persistent low lung volumes with mild stable subsegmental atelectasis and/or infiltrates. Small left pleural effusion cannot be excluded. No pneumothorax.   Electronically Signed   By: Maisie Fushomas  Register   On: 05/28/2014 07:06   Dg Abd Portable 1v  05/28/2014   CLINICAL DATA:  Feeding catheter placement  EXAM: PORTABLE ABDOMEN - 1 VIEW  COMPARISON:  None.  FINDINGS: Scattered large and small bowel gas is noted. An aortic stent graft is seen. A nasogastric catheter is noted within the stomach. No other focal abnormality is seen.  IMPRESSION: Nasogastric catheter within the stomach.   Electronically Signed   By: Alcide CleverMark  Lukens M.D.   On: 05/28/2014 21:30    Assessment/Plan: Status post craniotomy for intracerebral hemorrhage: The patient is neurologically stable.  LOS: 9 days     Ramonda Galyon D 05/29/2014, 8:42 AM

## 2014-05-29 NOTE — Progress Notes (Signed)
STROKE TEAM PROGRESS NOTE   HISTORY Carolyn [name] is a 69 y.o. female brought to hospital as code stroke. EMS was called to her work and told she was noted to have right arm weakness then noted to complain of severe HA which then was followed by sudden unconsciousness. LKW 05/20/2014 at 1240. On arrival she was unconscious on NRB but not protectin g airway well. CT head was obtained and showed a large left parietal ICH. While in ED patient has one episode emesis and intubated for air way protection. NIH stroke score was 31. Blood pressure was markedly elevated, and at one point was 241/123. Emergency management of hypertension was undertaken with use of Cardene intravenously. She was admitted to the neuro ICU for further evaluation and treatment.   SUBJECTIVE (INTERVAL HISTORY) No family at bedside. She is awake, open eyes and smile to physician. She is still intubated and not on sedation. Intermittently follows command on eye closure and opening. But not following other commands.   OBJECTIVE Temp:  [98.4 F (36.9 C)-98.8 F (37.1 C)] 98.8 F (37.1 C) (05/07 0800) Pulse Rate:  [67-102] 67 (05/07 0900) Cardiac Rhythm:  [-] Normal sinus rhythm (05/07 0800) Resp:  [14-26] 15 (05/07 0900) BP: (115-165)/(37-76) 131/55 mmHg (05/07 0900) SpO2:  [97 %-100 %] 98 % (05/07 0900) FiO2 (%):  [40 %] 40 % (05/07 0802) Weight:  [210 lb 12.2 oz (95.6 kg)] 210 lb 12.2 oz (95.6 kg) (05/07 0421)   Recent Labs Lab 05/28/14 0809 05/28/14 1203 05/28/14 1538 05/28/14 1959 05/29/14 0820  GLUCAP 119* 128* 97 116* 117*    Recent Labs Lab 05/24/14 0400 05/25/14 0530 05/26/14 0330 05/27/14 0600 05/28/14 0425 05/29/14 0510  NA 146* 146* 144 143 140 137  K 3.7 3.7 3.5 3.8 3.6 3.8  CL 108 106 105 104 101 100*  CO2 30 28 29 31 29 30   GLUCOSE 133* 136* 115* 129* 119* 118*  BUN 19 19 19 19 18 20   CREATININE 0.52 0.52 0.55 0.56 0.50 0.57  CALCIUM 8.6* 9.1 9.1 9.0 8.9 9.1  MG 2.0  --  2.2 2.1 2.1  2.1  PHOS 3.1  --  4.4 3.7 3.8 4.0   No results for input(s): AST, ALT, ALKPHOS, BILITOT, PROT, ALBUMIN in the last 168 hours.  Recent Labs Lab 05/25/14 0530 05/26/14 0330 05/27/14 0600 05/28/14 0425 05/29/14 0510  WBC 12.0* 12.2* 13.4* 12.2* 13.0*  NEUTROABS  --   --  9.8* 8.7* 9.3*  HGB 10.8* 10.5* 10.7* 10.3* 10.6*  HCT 36.5 35.0* 35.1* 33.8* 34.2*  MCV 88.4 88.4 89.3 87.1 86.6  PLT 284 271 278 273 306   No results for input(s): CKTOTAL, CKMB, CKMBINDEX, TROPONINI in the last 168 hours. No results for input(s): LABPROT, INR in the last 72 hours. No results for input(s): COLORURINE, LABSPEC, PHURINE, GLUCOSEU, HGBUR, BILIRUBINUR, KETONESUR, PROTEINUR, UROBILINOGEN, NITRITE, LEUKOCYTESUR in the last 72 hours.  Invalid input(s): APPERANCEUR     Component Value Date/Time   CHOL 242* 05/21/2014 1048   TRIG 181* 05/24/2014 0400   HDL 61 05/21/2014 1048   CHOLHDL 4.0 05/21/2014 1048   VLDL 49* 05/21/2014 1048   LDLCALC 132* 05/21/2014 1048   Lab Results  Component Value Date   HGBA1C 5.9* 05/21/2014      Component Value Date/Time   LABOPIA NONE DETECTED 05/20/2014 1513   COCAINSCRNUR NONE DETECTED 05/20/2014 1513   LABBENZ NONE DETECTED 05/20/2014 1513   AMPHETMU NONE DETECTED 05/20/2014 1513   THCU NONE DETECTED 05/20/2014  1513   LABBARB NONE DETECTED 05/20/2014 1513    No results for input(s): ETH in the last 168 hours.  I have personally reviewed the radiological images below and agree with the radiology interpretations.  Ct Head Wo Contrast 05/26/14 No significant change in volume of intraparenchymal hemorrhage. No new intracranial hemorrhage. Decreasing volume of intraventricular hemorrhage. No change in size of the cerebral ventricles. Recent nonhemorrhagic infarctions in the left occipital lobe and in the left globus pallidus/internal capsule genu.  05/21/2014   IMPRESSION: 1. Interval decompression of the left-sided cerebral hemorrhage, with subdural drain  in place along the craniotomy site. The hematoma is significantly reduced in volume. There is gas in the center of the hematoma and a small amount of gas and blood products in the extra-axial space on the left. 2. Intraventricular hemorrhage in both lateral ventricles and in the third and fourth ventricle, without hydrocephalus. 3. Faintly increased density along the right temporal lobe is compatible with a small amount of new subarachnoid hemorrhage along the right temporal lobe. 4. 3 mm of left-to-right midline shift.     05/20/2014   IMPRESSION: Large left frontal parietal intracerebral hemorrhage with a 7.5 mm of midline shift. The hemorrhage has extended into the left lateral ventricle.    Dg Chest Port 1 View  05/21/2014    IMPRESSION: NG tube placed.  Stable bibasilar atelectasis.  No evidence of pulmonary edema.     05/20/2014   IMPRESSION: Endotracheal tube tip at the carina with the tip directed toward the right main bronchus. No edema or consolidation. No pneumothorax.    2D echo -  estimated ejection fraction was in the range of 65% to 70%  MRI brain and MRA head and neck 1. Stable appearance of residual left frontal and parietal hemorrhage following the craniotomy. 2. Areas of acute/subacute nonhemorrhagic infarct within the occipital lobes bilaterally, the posterior left temporal lobe, and the left globus pallidus. 3. 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 The configuration suggests this could be an on stable plaque. 4. Moderate to high-grade stenosis of the proximal right vertebral artery without a significant distal stenosis. 5. Mild atherosclerotic changes at the right carotid bifurcation and bilateral cavernous internal carotid arteries without other stenoses. 6. Subarachnoid hemorrhage over the right temporal lobe. 7. Intravascular enhancement in the posterior circulation territories suggests luxury perfusion or less  likely meningitis. 8. Nonspecific enhancement surrounding the area of hemorrhage likely related to the recent surgery.   PHYSICAL EXAM  General - Well nourished, well developed elderly Caucasian lady, intubated   Ophthalmologic - fundi not visualized due to incorporation.  Cardiovascular - Regular rate and rhythm with no murmur.  Neuro - intubated not on sedation, awake, opening eyes spontaneously, intermittently following command with eye opening/closure but not following other commands. PERRL, pupil 3mm reactive to light, doll's eye present, corneal reflex and gag and cough reflex positive. Breathing over the vent. Spontaneously moving LUE and LLE, but 0/5 RUE on pain and trace withdraw on RLE on pain stimulation, no babinski bilaterally. Reflex 1+.    ASSESSMENT/PLAN Ms. Lawsyn Heiler is a 69 y.o. female with history of hypertension presenting with right arm weakness, severe headache, and subsequent unconsciousness.  She did not receive IV t-PA due to a large left frontal parietal intracerebral hemorrhage.  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  Repeat CT scans show new interval left parieto-occipital and basal ganglia infarcts raising concern for the initial hematoma  being hemorrhagic transformation from embolic stroke. Stroke also involve right PCA, MCA/PCA, and ACA regions, concerning for cardioembolic source. Left brain embolic stroke could also come from proximal left ICA stenosis with ulcerated plaque.  Resultant  Intubated and right hemiplegia  MRI as above concerning embolic stroke with hemorrhagic transformation  Repeat CT showed smaller hematoma and decreased midline shift  Heparin subq for VTE prophylaxis  NPO on tube feeding  aspirin 81 mg orally every day and clopidogrel 75 mg orally every day prior to admission, now on ASA 325mg    EEG 05/24/14 No seizure activity noted  Therapy recommendations: Pending  Disposition: need to  decide trach vs. extubation  Hypertension  Home meds: Toprol-XL  Consider adding on anti hypertensives to titrate down clevedipine.   Unstable  Put on po BP meds and  off drip  BP goal < 180  Close monitoring  On fentanyl.  Bradycardia and pauses at night. On dopamine gtt. Now off Cardiology feel it is vagally mediated   Hyperlipidemia  LDL 132  Other Stroke Risk Factors  Advanced age  Obesity, Body mass index is 36.16 kg/(m^2).   Other Active Problems  Intubated  Hyperkalemia  hyperglycemia  Mild anemia  Other Pertinent History  History of abdominal aortic aneurysm repair  Hospital day # 9  This patient is critically ill due to large ICH and possible embolic stroke with hemorrhagic transformation and at significant risk of neurological worsening, death form recurrent strokes and re-bleeding. This patient's care requires constant monitoring of vital signs, hemodynamics, respiratory and cardiac monitoring, review of multiple databases, neurological assessment, discussion with family, other specialists and medical decision making of high complexity. I spent 35 minutes of neurocritical care time in the care of this patient.  Marvel PlanJindong Atharva Mirsky, MD PhD Stroke Neurology 05/29/2014 9:18 AM     To contact Stroke Continuity provider, please refer to [email or web address]. After hours, contact General Neurology

## 2014-05-29 NOTE — Progress Notes (Signed)
PULMONARY  / CRITICAL CARE MEDICINE  Name: Emelia Sandoval MRN: [id number] DOB: 1945/12/05    LOS: 9  REFERRING MD :  Dr. Roseanne Reno  CHIEF COMPLAINT:  ICH  BRIEF PATIENT DESCRIPTION:  18 F with PMH of htn and recent stent of AAA performed @ [facility] presented to the ED 4/28 with L hemispheric hemorrhagic CVA. Intubated in ED due to depressed LOC and admitted to [facility] service with Stroke and NS consultants. 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    INDWELLING DEVICES:: ETT 4/28 >>  R radial A-line 4/28 >>  PICC (ordered) 4/29 >>   MAJOR EVENTS/TEST RESULTS: 4/28 admitted as above to [facility] service. Stroke team consultation. NS consultation 4/28 CT head: Large left frontal parietal intracerebral hemorrhage with a 7.5 mm of midline shift. The hemorrhage has extended into the left lateral ventricle 4/28 Procedure: Left frontoparietal craniotomy for evacuation of intracerebral hematoma. Surgeon: Gerlene Fee 4/29 CT head: Interval decompression of the left-sided cerebral hemorrhage, with subdural drain in place along the craniotomy site. The hematoma is significantly reduced in volume. There is gas in the center of the hematoma and a small amount of gas and blood products in the extra-axial space on the left. Intraventricular hemorrhage in both lateral ventricles and in the third and fourth ventricle, without hydrocephalus. Faintly increased density along the right temporal lobe is compatible with a small amount of new subarachnoid hemorrhage along the right temporal lobe. 3 mm of left-to-right midline shift 4/29 Remains intubated, sedated, on nicardipine, propofol 4/29 TTE: nml LV fn 4/30 CT head: "improved" 5/1 head CT (brady) >> no change 05/25/14: Afebrile. A-line BP reading 20 points higher than cuff 05/28/14: Opens eyes and squeezes hand but not wiggle toes on left.   SUBJECTIVE/OVERNIGHT/INTERVAL HX Tolerates some pressure support.   VITAL SIGNS: Temp:  [98.4 F (36.9  C)-99.3 F (37.4 C)] 98.6 F (37 C) (05/07 0421) Pulse Rate:  [69-102] 69 (05/07 0700) Resp:  [15-26] 16 (05/07 0700) BP: (115-165)/(37-76) 129/47 mmHg (05/07 0700) SpO2:  [97 %-100 %] 97 % (05/07 0700) FiO2 (%):  [40 %] 40 % (05/07 0421) Weight:  [210 lb 12.2 oz (95.6 kg)] 210 lb 12.2 oz (95.6 kg) (05/07 0421) HEMODYNAMICS:   VENTILATOR SETTINGS: Vent Mode:  [-] PRVC FiO2 (%):  [40 %] 40 % Set Rate:  [16 bmp] 16 bmp Vt Set:  [440 mL] 440 mL PEEP:  [5 cmH20] 5 cmH20 Pressure Support:  [5 cmH20] 5 cmH20 Plateau Pressure:  [9 cmH20-13 cmH20] 13 cmH20   INTAKE / OUTPUT: Intake/Output      05/06 0701 - 05/07 0700 05/07 0701 - 05/08 0700   I.V. (mL/kg) 270 (2.8)    NG/GT 1270    IV Piggyback 210    Total Intake(mL/kg) 1750 (18.3)    Urine (mL/kg/hr) 2395 (1)    Stool 0 (0)    Total Output 2395     Net -645          Stool Occurrence 2 x     PHYSICAL EXAMINATION: General: no distress Neuro: moves left side, follows simple commands, has some difficulty with word recognition HEENT: wound site clean Cardiovascular:  regular Lungs: no wheeze Abdomen:  Obese, soft, +BS Ext: warm, no edema  LABS:  PULMONARY  Recent Labs Lab 05/23/14 0408 05/26/14 0320 05/28/14 0444 05/29/14 0320  PHART 7.397 7.412 7.437 7.425  PCO2ART 45.8* 45.2* 44.4 45.0  PO2ART 85.3 72.0* 152* 121*  HCO3 27.6* 28.6* 29.4* 29.0*  TCO2 29.0 30 30.7 30.3  O2SAT 96.4 94.0 98.7 97.9   CBC  Recent Labs Lab 05/27/14 0600 05/28/14 0425 05/29/14 0510  HGB 10.7* 10.3* 10.6*  HCT 35.1* 33.8* 34.2*  WBC 13.4* 12.2* 13.0*  PLT 278 273 306    CHEMISTRY  Recent Labs Lab 05/24/14 0400 05/25/14 0530 05/26/14 0330 05/27/14 0600 05/28/14 0425 05/29/14 0510  NA 146* 146* 144 143 140 137  K 3.7 3.7 3.5 3.8 3.6 3.8  CL 108 106 105 104 101 100*  CO2 30 28 29 31 29 30   GLUCOSE 133* 136* 115* 129* 119* 118*  BUN 19 19 19 19 18 20   CREATININE 0.52 0.52 0.55 0.56 0.50 0.57  CALCIUM 8.6* 9.1 9.1  9.0 8.9 9.1  MG 2.0  --  2.2 2.1 2.1 2.1  PHOS 3.1  --  4.4 3.7 3.8 4.0   Estimated Creatinine Clearance: 75.5 mL/min (by C-G formula based on Cr of 0.57).   ENDOCRINE CBG (last 3)   Recent Labs  05/28/14 1203 05/28/14 1538 05/28/14 1959  GLUCAP 128* 97 116*   IMAGING x48h Dg Chest Port 1 View  05/29/2014   CLINICAL DATA:  Acute respiratory failure  EXAM: PORTABLE CHEST - 1 VIEW  COMPARISON:  05/28/2014  FINDINGS: The endotracheal tube tip is 4.3 cm above the carina. The nasogastric tube extends into the stomach. There is a left upper extremity PICC line with tip in the SVC. The lungs are clear. There is no pneumothorax. There is no large effusion.  IMPRESSION: Support equipment appears satisfactorily positioned.  The lungs are grossly clear   Electronically Signed   By: Ellery Plunkaniel R Mitchell M.D.   On: 05/29/2014 05:46   Dg Chest Port 1 View  05/28/2014   CLINICAL DATA:  Intubation .  EXAM: PORTABLE CHEST - 1 VIEW  COMPARISON:  05/27/2014.  FINDINGS: Endotracheal tube and NG tube in good anatomic position. Left PICC line in stable position. Mediastinum and hilar structures are stable. Heart size stable. Low lung volumes with mild bibasilar atelectasis. No change. Small left pleural effusion cannot be excluded. No pneumothorax.  IMPRESSION: 1. Lines and tubes in stable position. 2. Persistent low lung volumes with mild stable subsegmental atelectasis and/or infiltrates. Small left pleural effusion cannot be excluded. No pneumothorax.   Electronically Signed   By: Maisie Fushomas  Register   On: 05/28/2014 07:06   Dg Abd Portable 1v  05/28/2014   CLINICAL DATA:  Feeding catheter placement  EXAM: PORTABLE ABDOMEN - 1 VIEW  COMPARISON:  None.  FINDINGS: Scattered large and small bowel gas is noted. An aortic stent graft is seen. A nasogastric catheter is noted within the stomach. No other focal abnormality is seen.  IMPRESSION: Nasogastric catheter within the stomach.   Electronically Signed   By: Alcide CleverMark  Lukens  M.D.   On: 05/28/2014 21:30   ASSESSMENT / PLAN:  PULMONARY A: Acute respiratory failure in setting of CVA. P: Pressure support wean as tolerated Would need trach to protect airway F/u CXR intermittently  CARDIOVASCULAR A: HTN emergency. Hx of AAA s/p repair. Hypotension, junctional bradycardia >> improved. P:  Monitor hemodynamics  RENAL A: No acute issues. P: Monitor renal fx  GASTROINTESTINAL A: Nutrition. P:   Tube feeds Protonix for SUP  HEMATOLOGIC A: Anemia of critical illness. P: F/u CBC SQ heparin for DVT prevention  INFECTIOUS A: No evidence for infection. P: Monitor temp, WBC count  ENDOCRINE A: Hx of hypothyroidism.  Hyperglycemia. 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   P: RASS goal: 0 Keppra per neurology  Goals of care: Family deciding about trach/peg and prolonged support, versus withdrawal of vent >> likely to decide 5/08.  Coralyn HellingVineet Darica Goren, MD [facility] Pulmonary/Critical Care 05/29/2014, 7:41 AM Pager:  [phone] After 3pm call: [phone]

## 2014-05-30 DIAGNOSIS — E785 Hyperlipidemia, unspecified: Secondary | ICD-10-CM

## 2014-05-30 LAB — CBC
HEMATOCRIT: 35.2 % — AB (ref 36.0–46.0)
Hemoglobin: 10.9 g/dL — ABNORMAL LOW (ref 12.0–15.0)
MCH: 26.8 pg (ref 26.0–34.0)
MCHC: 31 g/dL (ref 30.0–36.0)
MCV: 86.5 fL (ref 78.0–100.0)
Platelets: 319 10*3/uL (ref 150–400)
RBC: 4.07 MIL/uL (ref 3.87–5.11)
RDW: 15.1 % (ref 11.5–15.5)
WBC: 12.7 10*3/uL — ABNORMAL HIGH (ref 4.0–10.5)

## 2014-05-30 LAB — GLUCOSE, CAPILLARY
GLUCOSE-CAPILLARY: 105 mg/dL — AB (ref 70–99)
GLUCOSE-CAPILLARY: 124 mg/dL — AB (ref 70–99)
GLUCOSE-CAPILLARY: 131 mg/dL — AB (ref 70–99)
Glucose-Capillary: 124 mg/dL — ABNORMAL HIGH (ref 70–99)
Glucose-Capillary: 114 mg/dL — ABNORMAL HIGH (ref 70–99)
Glucose-Capillary: 117 mg/dL — ABNORMAL HIGH (ref 70–99)
Glucose-Capillary: 99 mg/dL (ref 70–99)

## 2014-05-30 LAB — BASIC METABOLIC PANEL
Anion gap: 10 (ref 5–15)
BUN: 21 mg/dL — ABNORMAL HIGH (ref 6–20)
CHLORIDE: 100 mmol/L — AB (ref 101–111)
CO2: 30 mmol/L (ref 22–32)
Calcium: 9.3 mg/dL (ref 8.9–10.3)
Creatinine, Ser: 0.5 mg/dL (ref 0.44–1.00)
GFR calc non Af Amer: >60 mL/min (ref >60)
Glucose, Bld: 121 mg/dL — ABNORMAL HIGH (ref 70–99)
POTASSIUM: 3.9 mmol/L (ref 3.5–5.1)
Sodium: 140 mmol/L (ref 135–145)

## 2014-05-30 LAB — PHOSPHORUS: Phosphorus: 4 mg/dL (ref 2.5–4.6)

## 2014-05-30 LAB — MAGNESIUM: MAGNESIUM: 2.2 mg/dL (ref 1.7–2.4)

## 2014-05-30 MED ORDER — LEVOTHYROXINE SODIUM 100 MCG IV SOLR
37.5000 ug | Freq: Every day | INTRAVENOUS | Status: DC
  Administered 2014-05-31 – 2014-06-01 (×2): 37.5 ug via INTRAVENOUS
  Filled 2014-05-30 (×3): qty 5

## 2014-05-30 MED ORDER — PANTOPRAZOLE SODIUM 40 MG IV SOLR
40.0000 mg | INTRAVENOUS | Status: DC
  Administered 2014-05-30 – 2014-06-02 (×4): 40 mg via INTRAVENOUS
  Filled 2014-05-30 (×4): qty 40

## 2014-05-30 MED ORDER — SODIUM CHLORIDE 0.9 % IV SOLN
500.0000 mg | Freq: Two times a day (BID) | INTRAVENOUS | Status: DC
  Administered 2014-05-30 – 2014-06-02 (×6): 500 mg via INTRAVENOUS
  Filled 2014-05-30 (×10): qty 5

## 2014-05-30 MED ORDER — FENTANYL CITRATE (PF) 100 MCG/2ML IJ SOLN: 25.0000 ug | INTRAMUSCULAR | Status: DC | PRN

## 2014-05-30 NOTE — Progress Notes (Signed)
PULMONARY  / CRITICAL CARE MEDICINE  Name: Carolyn [name] MRN: [id number] DOB: 05/01/1945    LOS: 10  REFERRING MD :  Dr. Roseanne RenoStewart  CHIEF COMPLAINT:  ICH  BRIEF PATIENT DESCRIPTION:  [age] F with PMH of htn and [facility] ED 4/28 with L hemispheric hemorrhagic CVA. Intubated in ED due to depressed LOC and [name] with Stroke and NS consultants. 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    MAJOR EVENTS/TEST RESULTS: [facility]roke team consultation. NS consultation 4/28 CT head: Large left frontal parietal intracerebral hemorrhage with a 7.5 mm of midline shift. The hemorrhage has extended into the left lateral ventricle 4/28 Procedure: Left frontoparietal craniotomy for evacuation of intracerebral hematoma. Surgeon: Carolyn [name] 4/29 CT head: Interval decompression of the left-sided cerebral hemorrhage, with subdural drain in place along the craniotomy site. The hematoma is significantly reduced in volume. There is gas in the [name] of the hematoma and a small amount of gas and blood products in the extra-axial space on the left. Intraventricular hemorrhage in both lateral ventricles and in the third and fourth ventricle, without hydrocephalus. Faintly increased density along the right temporal lobe is compatible with a small amount of new subarachnoid hemorrhage along the right temporal lobe. 3 mm of left-to-right midline shift 4/29 Remains intubated, sedated, on nicardipine, propofol 4/29 TTE: nml LV fn 4/30 CT head: "improved" 5/1 head CT (brady) >> no change 05/25/14: Afebrile. A-line BP reading 20 points higher than cuff 05/28/14: Opens eyes and squeezes hand but not wiggle toes on left.   SUBJECTIVE: Tolerates some pressure support.   VITAL SIGNS: Temp:  [97.9 F (36.6 C)-99 F (37.2 C)] 98.4 F (36.9 C) (05/08 0425) Pulse Rate:  [65-81] 77 (05/08 0734) Resp:  [13-22] 16  (05/08 0734) BP: (106-157)/(32-87) 125/55 mmHg (05/08 0734) SpO2:  [92 %-100 %] 99 % (05/08 0700) FiO2 (%):  [40 %] 40 % (05/08 0734) Weight:  [212 lb 1.3 oz (96.2 kg)] 212 lb 1.3 oz (96.2 kg) (05/08 0300) VENTILATOR SETTINGS: Vent Mode:  [-] PSV;PRVC FiO2 (%):  [40 %] 40 % Set Rate:  [16 bmp] 16 bmp Vt Set:  [440 mL] 440 mL PEEP:  [5 cmH20] 5 cmH20 Pressure Support:  [5 cmH20-8 cmH20] 5 cmH20 Plateau Pressure:  [12 cmH20-13 cmH20] 12 cmH20   INTAKE / OUTPUT: Intake/Output      05/07 0701 - 05/08 0700 05/08 0701 - 05/09 0700   I.V. (mL/kg) 240 (2.5)    NG/GT 2545    IV Piggyback     Total Intake(mL/kg) 2785 (29)    Urine (mL/kg/hr) 1735 (0.8)    Stool 0 (0)    Total Output 1735     Net +1050          Stool Occurrence 3 x     PHYSICAL EXAMINATION: General: no distress Neuro: moves left side, follows simple commands, has some difficulty with word recognition HEENT: wound site clean Cardiovascular:  regular Lungs: no wheeze Abdomen:  Obese, soft, +BS Ext: warm, no edema  LABS:  PULMONARY  Recent Labs Lab 05/26/14 0320 05/28/14 0444 05/29/14 0320  PHART 7.412 7.437 7.425  PCO2ART 45.2* 44.4 45.0  PO2ART 72.0* 152* 121*  HCO3 28.6* 29.4* 29.0*  TCO2 30 30.7 30.3  O2SAT 94.0 98.7 97.9   CBC  Recent Labs Lab 05/28/14 0425 05/29/14 0510 05/30/14 0520  HGB 10.3* 10.6* 10.9*  HCT 33.8* 34.2* 35.2*  WBC 12.2* 13.0* 12.7*  PLT 273 306 319    CHEMISTRY  Recent Labs Lab 05/26/14 0330 05/27/14 0600 05/28/14 0425 05/29/14 0510 05/30/14 0520  NA 144 143 140 137 140  K 3.5 3.8 3.6 3.8 3.9  CL 105 104 101 100* 100*  CO2 29 31 29 30 30   GLUCOSE 115* 129* 119* 118* 121*  BUN 19 19 18 20  21*  CREATININE 0.55 1.610.56 0.50 0.57 0.50  CALCIUM 9.1 9.0 8.9 9.1 9.3  MG 2.2 2.1 2.1 2.1 2.2  PHOS 4.4 3.7 3.8 4.0 4.0   Estimated Creatinine Clearance: 75.8 mL/min (by C-G formula based on Cr of 0.5).   ENDOCRINE CBG (last 3)   Recent Labs  05/29/14 2005  05/29/14 2351 05/30/14 0400  GLUCAP 109* 131* 124*   IMAGING x48h Dg Chest Port 1 View  05/29/2014   CLINICAL DATA:  Acute respiratory failure  EXAM: PORTABLE CHEST - 1 VIEW  COMPARISON:  05/28/2014  FINDINGS: The endotracheal tube tip is 4.3 cm above the carina. The nasogastric tube extends into the stomach. There is a left upper extremity PICC line with tip in the SVC. The lungs are clear. There is no pneumothorax. There is no large effusion.  IMPRESSION: Support equipment appears satisfactorily positioned.  The lungs are grossly clear   Electronically Signed   By: Ellery Plunkaniel R Mitchell M.D.   On: 05/29/2014 05:46   Dg Abd Portable 1v  05/28/2014   CLINICAL DATA:  Feeding catheter placement  EXAM: PORTABLE ABDOMEN - 1 VIEW  COMPARISON:  None.  FINDINGS: Scattered large and small bowel gas is noted. An aortic stent graft is seen. A nasogastric catheter is noted within the stomach. No other focal abnormality is seen.  IMPRESSION: Nasogastric catheter within the stomach.   Electronically Signed   By: Alcide CleverMark  Lukens M.D.   On: 05/28/2014 21:30   ASSESSMENT / PLAN:  PULMONARY A: Acute respiratory failure in setting of CVA. P: Pressure support wean as tolerated >> she is ready for extubation trial Family would want re-intubation if she fails extubation trial  F/u CXR intermittently  CARDIOVASCULAR A: HTN emergency. Hx of AAA s/p repair. Hypotension, junctional bradycardia >> improved. P:  Monitor hemodynamics  RENAL A: No acute issues. P: Monitor renal fx  GASTROINTESTINAL A: Nutrition. P:   Tube feeds Swallow assessment after extubation Protonix for SUP  HEMATOLOGIC A: Anemia of critical illness. P: F/u CBC SQ heparin for DVT prevention  INFECTIOUS A: No evidence for infection. P: Monitor temp, WBC count  ENDOCRINE A: Hx of hypothyroidism.  Hyperglycemia. 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    P: RASS goal: 0 Keppra per neurology  Goals of care: Spoke with pt's daughter over phone.  Explained that Carolyn. [name] is medically ready for extubation trial.  The family would want re-intubation if she fails extubation trial.  They would then have a better idea about her long term prognosis, and this would help them decide about whether tracheostomy would be a consideration.  CC time 35 minutes.  Coralyn HellingVineet Jenai Scaletta, MD [facility] Pulmonary/Critical Care 05/30/2014, 8:18 AM Pager:  [phone] After 3pm call: [phone]

## 2014-05-30 NOTE — Progress Notes (Signed)
PT Cancellation Note  Patient Details Name: Carolyn CourtGeraldine [name] MRN: [id number] DOB: [birth year]   Cancelled Treatment:    Reason Eval/Treat Not Completed: Medical issues which prohibited therapy (pt just extubated will follow next date)   Delorse Lekabor, Ermel Verne Beth 05/30/2014, 10:54 AM Delaney MeigsMaija Tabor Namira Rosekrans, PT [phone]

## 2014-05-30 NOTE — Procedures (Signed)
**Note De-Identified Trayon Krantz Obfuscation** Extubation Procedure Note  Patient Details:   Name: Carolyn [name] DOB: 09-04-1945 MRN: [id number]   Airway Documentation:     Evaluation  O2 sats: stable throughout Complications: No apparent complications Patient did tolerate procedure well. Bilateral Breath Sounds: Clear, Diminished Suctioning: Airway No + leak, no stridor noted Carolyn [name], Megan SalonWendy [name] 05/30/2014, 10:17 AM

## 2014-05-30 NOTE — Progress Notes (Signed)
STROKE TEAM PROGRESS NOTE   SUBJECTIVE (INTERVAL HISTORY) No family at bedside. She is awake, open eyes and smile to physician. She is still intubated and not on sedation. Inconsistently follows command on eye closure and opening, and showing fingers. Plan to extubate today.   OBJECTIVE Temp:  [97.9 F (36.6 C)-99 F (37.2 C)] 98.4 F (36.9 C) (05/08 0425) Pulse Rate:  [65-81] 77 (05/08 0734) Cardiac Rhythm:  [-] Normal sinus rhythm (05/07 2000) Resp:  [13-22] 16 (05/08 0734) BP: (106-157)/(32-87) 125/55 mmHg (05/08 0734) SpO2:  [92 %-100 %] 99 % (05/08 0700) FiO2 (%):  [40 %] 40 % (05/08 0734) Weight:  [212 lb 1.3 oz (96.2 kg)] 212 lb 1.3 oz (96.2 kg) (05/08 0300)   Recent Labs Lab 05/29/14 1557 05/29/14 2005 05/29/14 2351 05/30/14 0400 05/30/14 0809  GLUCAP 119* 109* 131* 124* 114*    Recent Labs Lab 05/26/14 0330 05/27/14 0600 05/28/14 0425 05/29/14 0510 05/30/14 0520  NA 144 143 140 137 140  K 3.5 3.8 3.6 3.8 3.9  CL 105 104 101 100* 100*  CO2 29 31 29 30 30   GLUCOSE 115* 129* 119* 118* 121*  BUN 19 19 18 20  21*  CREATININE 0.55 0.56 0.50 0.57 0.50  CALCIUM 9.1 9.0 8.9 9.1 9.3  MG 2.2 2.1 2.1 2.1 2.2  PHOS 4.4 3.7 3.8 4.0 4.0   No results for input(s): AST, ALT, ALKPHOS, BILITOT, PROT, ALBUMIN in the last 168 hours.  Recent Labs Lab 05/26/14 0330 05/27/14 0600 05/28/14 0425 05/29/14 0510 05/30/14 0520  WBC 12.2* 13.4* 12.2* 13.0* 12.7*  NEUTROABS  --  9.8* 8.7* 9.3*  --   HGB 10.5* 10.7* 10.3* 10.6* 10.9*  HCT 35.0* 35.1* 33.8* 34.2* 35.2*  MCV 88.4 89.3 87.1 86.6 86.5  PLT 271 278 273 306 319   No results for input(s): CKTOTAL, CKMB, CKMBINDEX, TROPONINI in the last 168 hours. No results for input(s): LABPROT, INR in the last 72 hours. No results for input(s): COLORURINE, LABSPEC, PHURINE, GLUCOSEU, HGBUR, BILIRUBINUR, KETONESUR, PROTEINUR, UROBILINOGEN, NITRITE, LEUKOCYTESUR in the last 72 hours.  Invalid input(s): APPERANCEUR     Component  Value Date/Time   CHOL 242* 05/21/2014 1048   TRIG 181* 05/24/2014 0400   HDL 61 05/21/2014 1048   CHOLHDL 4.0 05/21/2014 1048   VLDL 49* 05/21/2014 1048   LDLCALC 132* 05/21/2014 1048   Lab Results  Component Value Date   HGBA1C 5.9* 05/21/2014      Component Value Date/Time   LABOPIA NONE DETECTED 05/20/2014 1513   COCAINSCRNUR NONE DETECTED 05/20/2014 1513   LABBENZ NONE DETECTED 05/20/2014 1513   AMPHETMU NONE DETECTED 05/20/2014 1513   THCU NONE DETECTED 05/20/2014 1513   LABBARB NONE DETECTED 05/20/2014 1513    No results for input(s): ETH in the last 168 hours.  I have personally reviewed the radiological images below and agree with the radiology interpretations.  Ct Head Wo Contrast 05/26/14 No significant change in volume of intraparenchymal hemorrhage. No new intracranial hemorrhage. Decreasing volume of intraventricular hemorrhage. No change in size of the cerebral ventricles. Recent nonhemorrhagic infarctions in the left occipital lobe and in the left globus pallidus/internal capsule genu.  05/21/2014   IMPRESSION: 1. Interval decompression of the left-sided cerebral hemorrhage, with subdural drain in place along the craniotomy site. The hematoma is significantly reduced in volume. There is gas in the center of the hematoma and a small amount of gas and blood products in the extra-axial space on the left. 2. Intraventricular hemorrhage in  both lateral ventricles and in the third and fourth ventricle, without hydrocephalus. 3. Faintly increased density along the right temporal lobe is compatible with a small amount of new subarachnoid hemorrhage along the right temporal lobe. 4. 3 mm of left-to-right midline shift.     05/20/2014   IMPRESSION: Large left frontal parietal intracerebral hemorrhage with a 7.5 mm of midline shift. The hemorrhage has extended into the left lateral ventricle.    Dg Chest Port 1 View  05/21/2014    IMPRESSION: NG tube placed.  Stable bibasilar  atelectasis.  No evidence of pulmonary edema.     05/20/2014   IMPRESSION: Endotracheal tube tip at the carina with the tip directed toward the right main bronchus. No edema or consolidation. No pneumothorax.    2D echo -  estimated ejection fraction was in the range of 65% to 70%  MRI brain and MRA head and neck 1. Stable appearance of residual left frontal and parietal hemorrhage following the craniotomy. 2. Areas of acute/subacute nonhemorrhagic infarct within the occipital lobes bilaterally, the posterior left temporal lobe, and the left globus pallidus. 3. 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 The configuration suggests this could be an on stable plaque. 4. Moderate to high-grade stenosis of the proximal right vertebral artery without a significant distal stenosis. 5. Mild atherosclerotic changes at the right carotid bifurcation and bilateral cavernous internal carotid arteries without other stenoses. 6. Subarachnoid hemorrhage over the right temporal lobe. 7. Intravascular enhancement in the posterior circulation territories suggests luxury perfusion or less likely meningitis. 8. Nonspecific enhancement surrounding the area of hemorrhage likely related to the recent surgery.   PHYSICAL EXAM  General - Well nourished, well developed elderly Caucasian lady, intubated   Ophthalmologic - fundi not visualized due to incorporation.  Cardiovascular - Regular rate and rhythm with no murmur.  Neuro - intubated not on sedation, awake, opening eyes spontaneously, inconsistenly following command with eye opening/closure and showing fingers. PERRL, pupil 3mm reactive to light, doll's eye present, corneal reflex and gag and cough reflex positive. Breathing over the vent. Eyes has right gaze preference but can cross midline. Not blinking to visual threat on the right side. Spontaneously moving LUE and LLE, but 0/5 RUE on pain and trace  withdraw on RLE on pain stimulation, no babinski bilaterally. Reflex 1+.    ASSESSMENT/PLAN Carolyn [name] is a 69 y.o. female with history of hypertension presenting with right arm weakness, severe headache, and subsequent unconsciousness.  She did not receive IV t-PA due to a large left frontal parietal intracerebral hemorrhage.  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  Repeat CT scans show new interval left parieto-occipital and basal ganglia infarcts raising concern for the initial hematoma being hemorrhagic transformation from embolic stroke. Stroke also involve right PCA, MCA/PCA, and ACA regions, concerning for cardioembolic source. Left brain embolic stroke could also come from proximal left ICA stenosis with ulcerated plaque.  Resultant  Intubated and right hemiplegia  MRI as above concerning embolic stroke with hemorrhagic transformation  Repeat CT showed smaller hematoma and decreased midline shift  Heparin subq for VTE prophylaxis  NPO on tube feeding  aspirin 81 mg orally every day and clopidogrel 75 mg orally every day prior to admission, now on ASA    EEG 05/24/14 No seizure activity noted  Continue keppra for now for seizure prophylaxis  Therapy recommendations: Pending  Disposition: pending  Respiratory failure  Intubated   Plan to  extubate today  CCM following  Hypertension  Home meds: Toprol-XL  Currently not on BP meds   stable  Close monitoring  Hyperlipidemia  LDL 132  Pt has allergy to statins  Hold off statin now  Other Stroke Risk Factors  Advanced age  Obesity, Body mass index is 36.39 kg/(m^2).   Other Active Problems  Intubated  Hyperkalemia  hyperglycemia  Mild anemia  Other Pertinent History  History of abdominal aortic aneurysm repair  Hospital day # 10  This patient is critically ill due to large ICH and possible embolic stroke with hemorrhagic transformation and at  significant risk of neurological worsening, death form recurrent strokes and re-bleeding. This patient's care requires constant monitoring of vital signs, hemodynamics, respiratory and cardiac monitoring, review of multiple databases, neurological assessment, discussion with family, other specialists and medical decision making of high complexity. I spent 35 minutes of neurocritical care time in the care of this patient.  Marvel PlanJindong Hser Belanger, MD PhD Stroke Neurology 05/30/2014 9:09 AM     To contact Stroke Continuity provider, please refer to [email or web address]. After hours, contact General Neurology

## 2014-05-30 NOTE — Progress Notes (Signed)
SLP Cancellation Note  Patient Details Name: [name] CourtGeraldine Pellecchia MRN: [id number] DOB: 01-17-1946   Cancelled treatment:       Reason Eval/Treat Not Completed: Patient declined, no reason specified (Pt refused PO presentation.)   Fleet ContrasGoodman, Deashia Soule N 05/30/2014, 2:47 PM  Dimas AguasMelissa Jarold Macomber, MA, CCC-SLP Acute Rehab SLP [phone]

## 2014-05-30 NOTE — Progress Notes (Signed)
Patient ID: Carolyn CourtGeraldine [name], female   DOB: [birth year], 69 y.o.   MRN: [id number] Subjective:  The patient is alert and attentive. She is in no apparent distress  Objective: Vital signs in last 24 hours: Temp:  [97.9 F (36.6 C)-99 F (37.2 C)] 98.4 F (36.9 C) (05/08 0425) Pulse Rate:  [65-83] 72 (05/08 0700) Resp:  [13-22] 16 (05/08 0700) BP: (106-157)/(32-87) 125/55 mmHg (05/08 0700) SpO2:  [92 %-100 %] 99 % (05/08 0700) FiO2 (%):  [40 %] 40 % (05/08 0600) Weight:  [96.2 kg (212 lb 1.3 oz)] 96.2 kg (212 lb 1.3 oz) (05/08 0300)  Intake/Output from previous day: 05/07 0701 - 05/08 0700 In: 2785 [I.V.:240; NG/GT:2545] Out: 1735 [Urine:1735] Intake/Output this shift:    Physical exam the patient is alert. She is attentive. She is right hemiplegic. Glasgow Coma Scale 10 intubated  Lab Results:  Recent Labs  05/29/14 0510 05/30/14 0520  WBC 13.0* 12.7*  HGB 10.6* 10.9*  HCT 34.2* 35.2*  PLT 306 319   BMET  Recent Labs  05/29/14 0510 05/30/14 0520  NA 137 140  K 3.8 3.9  CL 100* 100*  CO2 30 30  GLUCOSE 118* 121*  BUN 20 21*  CREATININE 0.57 0.50  CALCIUM 9.1 9.3    Studies/Results: Dg Chest Port 1 View  05/29/2014   CLINICAL DATA:  Acute respiratory failure  EXAM: PORTABLE CHEST - 1 VIEW  COMPARISON:  05/28/2014  FINDINGS: The endotracheal tube tip is 4.3 cm above the carina. The nasogastric tube extends into the stomach. There is a left upper extremity PICC line with tip in the SVC. The lungs are clear. There is no pneumothorax. There is no large effusion.  IMPRESSION: Support equipment appears satisfactorily positioned.  The lungs are grossly clear   Electronically Signed   By: Ellery Plunkaniel R Mitchell M.D.   On: 05/29/2014 05:46   Dg Abd Portable 1v  05/28/2014   CLINICAL DATA:  Feeding catheter placement  EXAM: PORTABLE ABDOMEN - 1 VIEW  COMPARISON:  None.  FINDINGS: Scattered large and small bowel gas is noted. An aortic stent graft is seen. A nasogastric catheter is  noted within the stomach. No other focal abnormality is seen.  IMPRESSION: Nasogastric catheter within the stomach.   Electronically Signed   By: Alcide CleverMark  Lukens M.D.   On: 05/28/2014 21:30    Assessment/Plan: Status post craniotomy for intracerebral hemorrhage: The patient is slowly improving neurologically. She seems a phasic.  LOS: 10 days     Keli Buehner D 05/30/2014, 7:24 AM

## 2014-05-31 ENCOUNTER — Inpatient Hospital Stay (HOSPITAL_COMMUNITY): Payer: BLUE CROSS/BLUE SHIELD

## 2014-05-31 DIAGNOSIS — I6522 Occlusion and stenosis of left carotid artery: Secondary | ICD-10-CM

## 2014-05-31 LAB — BASIC METABOLIC PANEL
Anion gap: 9 (ref 5–15)
BUN: 18 mg/dL (ref 6–20)
CALCIUM: 9.4 mg/dL (ref 8.9–10.3)
CO2: 28 mmol/L (ref 22–32)
Chloride: 103 mmol/L (ref 101–111)
Creatinine, Ser: 0.62 mg/dL (ref 0.44–1.00)
GLUCOSE: 98 mg/dL (ref 70–99)
POTASSIUM: 3.7 mmol/L (ref 3.5–5.1)
SODIUM: 140 mmol/L (ref 135–145)

## 2014-05-31 LAB — CBC
HCT: 35.5 % — ABNORMAL LOW (ref 36.0–46.0)
Hemoglobin: 11.2 g/dL — ABNORMAL LOW (ref 12.0–15.0)
MCH: 27.3 pg (ref 26.0–34.0)
MCHC: 31.5 g/dL (ref 30.0–36.0)
MCV: 86.6 fL (ref 78.0–100.0)
Platelets: 327 10*3/uL (ref 150–400)
RBC: 4.1 MIL/uL (ref 3.87–5.11)
RDW: 15 % (ref 11.5–15.5)
WBC: 12.8 10*3/uL — AB (ref 4.0–10.5)

## 2014-05-31 LAB — GLUCOSE, CAPILLARY
GLUCOSE-CAPILLARY: 99 mg/dL (ref 70–99)
Glucose-Capillary: 90 mg/dL (ref 70–99)
Glucose-Capillary: 99 mg/dL (ref 70–99)

## 2014-05-31 NOTE — Progress Notes (Addendum)
PULMONARY  / CRITICAL CARE MEDICINE  Name: Carolyn CourtGeraldine [name] MRN: [id number] DOB: [birth year]    LOS: 11  REFERRING MD :  Dr. Roseanne RenoStewart  CHIEF COMPLAINT:  ICH  BRIEF PATIENT DESCRIPTION:  [age] F with PMH of htn and [facility] presented to the ED 4/28 with L hemispheric hemorrhagic CVA. Intubated in ED due to depressed LOC [facility] and NS consultants. 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    MAJOR EVENTS/TEST RESULTS: [facility]28 [facility] Stroke team consultation. NS consultation 4/28 CT head: Large left frontal parietal intracerebral hemorrhage with a 7.5 mm of midline shift. The hemorrhage has extended into the left lateral ventricle 4/28 Procedure: Left frontoparietal craniotomy for evacuation of intracerebral hematoma. Surgeon: [name] 4/29 CT head: Interval decompression of the left-sided cerebral hemorrhage, with subdural drain in place along the craniotomy site. The hematoma is significantly reduced in volume. There is gas in the center of the hematoma and a small amount of gas and blood products in the extra-axial space on the left. Intraventricular hemorrhage in both lateral ventricles and in the third and fourth ventricle, without hydrocephalus. Faintly increased density along the right temporal lobe is compatible with a small amount of new subarachnoid hemorrhage along the right temporal lobe. 3 mm of left-to-right midline shift 4/29 Remains intubated, sedated, on nicardipine, propofol 4/29 TTE: nml LV fn 4/30 CT head: "improved" 5/1 head CT (brady) >> no change 05/25/14: Afebrile. A-line BP reading 20 points higher than cuff 05/28/14: Opens eyes and squeezes hand but not wiggle toes on left.  5/8  Extubated  SUBJECTIVE: Remains extubated  VITAL SIGNS: Temp:  [98.6 F (37 C)-98.9 F (37.2 C)] 98.7 F (37.1 C) (05/09 0742) Pulse Rate:  [58-88] 78 (05/09 0900) Resp:  [14-32] 32  (05/09 0900) BP: (100-164)/(41-72) 161/50 mmHg (05/09 0900) SpO2:  [93 %-100 %] 96 % (05/09 0900) Weight:  [94.8 kg (208 lb 15.9 oz)] 94.8 kg (208 lb 15.9 oz) (05/09 0453) VENTILATOR SETTINGS:     INTAKE / OUTPUT: Intake/Output      05/08 0701 - 05/09 0700 05/09 0701 - 05/10 0700   I.V. (mL/kg) 1087.3 (11.5) 130 (1.4)   NG/GT 55    IV Piggyback 105 105   Total Intake(mL/kg) 1247.3 (13.2) 235 (2.5)   Urine (mL/kg/hr) 2140 (0.9) 200 (0.6)   Stool 0 (0)    Total Output 2140 200   Net -892.7 +35        Stool Occurrence 1 x     PHYSICAL EXAMINATION: General: no distress Neuro: moves left side, follows some commands HEENT: wound site clean Cardiovascular:  s1 s2 regular Lungs: reduced bases Abdomen:  Obese, soft, +BS Ext: warm, no edema  LABS:  PULMONARY  Recent Labs Lab 05/26/14 0320 05/28/14 0444 05/29/14 0320  PHART 7.412 7.437 7.425  PCO2ART 45.2* 44.4 45.0  PO2ART 72.0* 152* 121*  HCO3 28.6* 29.4* 29.0*  TCO2 30 30.7 30.3  O2SAT 94.0 98.7 97.9   CBC  Recent Labs Lab 05/29/14 0510 05/30/14 0520 05/31/14 0500  HGB 10.6* 10.9* 11.2*  HCT 34.2* 35.2* 35.5*  WBC 13.0* 12.7* 12.8*  PLT 306 319 327    CHEMISTRY  Recent Labs Lab 05/26/14 0330 05/27/14 0600 05/28/14 0425 05/29/14 0510 05/30/14 0520 05/31/14 0500  NA 144 143 140 137 140 140  K 3.5 3.8 3.6 3.8 3.9 3.7  CL 105 104 101 100* 100* 103  CO2 29 31  29 30 30 28   GLUCOSE 115* 129* 119* 118* 121* 98  BUN 19 19 18 20  21* 18  CREATININE 0.55 0.56 0.50 0.57 0.50 0.62  CALCIUM 9.1 9.0 8.9 9.1 9.3 9.4  MG 2.2 2.1 2.1 2.1 2.2  --   PHOS 4.4 3.7 3.8 4.0 4.0  --    Estimated Creatinine Clearance: 75.1 mL/min (by C-G formula based on Cr of 0.62).   ENDOCRINE CBG (last 3)   Recent Labs  05/30/14 2342 05/31/14 0412 05/31/14 0751  GLUCAP 90 99 99   IMAGING x48h Dg Chest Port 1 View  05/31/2014   CLINICAL DATA:  Respiratory failure  EXAM: PORTABLE CHEST - 1 VIEW  COMPARISON:  05/29/2014   FINDINGS: Left upper extremity PICC, tip at the upper SVC.  Borderline cardiomegaly, accentuated by technique. Stable mild aortic tortuosity. Stable inflation after tracheal and esophageal extubation, with mild residual interstitial crowding at the left base. There is no suspected edema, consolidation, effusion, or pneumothorax.  IMPRESSION: Stable inflation post extubation.   Electronically Signed   By: Marnee SpringJonathon  Watts M.D.   On: 05/31/2014 07:54   ASSESSMENT / PLAN:  PULMONARY A: Acute respiratory failure in setting of CVA. P: IS as able pcxr for atx  CARDIOVASCULAR A: HTN emergency. Hx of AAA s/p repair. Hypotension, junctional bradycardia >> improved. P:  tele  RENAL A: No acute issues. P: No role hypernatremia this far out Chem in am   GASTROINTESTINAL A: Nutrition off dysphagia  P:   Swallow assessment SLP- modified Protonix for SUP  HEMATOLOGIC A: Anemia of critical illness. P: SQ heparin  INFECTIOUS A: No evidence for infection. P: Monitor temp, WBC count  ENDOCRINE A: Hx of hypothyroidism.  Hyperglycemia. P: Levothyroxine SSI, may dc with TF off  NEUROLOGIC A: Lt frontal parietal ICH 2nd to HTN emergency >> s/p craniotomy 4/28.   P: RASS goal: 0 Keppra per neurology, consider 21 days then dc PT consulted OT  Consider transfer  Mcarthur Rossettianiel J. Tyson AliasFeinstein, MD, FACP Pgr: [phone] [facility]

## 2014-05-31 NOTE — Progress Notes (Signed)
STROKE TEAM PROGRESS NOTE   SUBJECTIVE (INTERVAL HISTORY) Daughter is at bedside. She is awake, open eyes and smile to physician. She was extubated yesterday and tolerating well. Inconsistently follows command. Swallow evaluation pending.  OBJECTIVE Temp:  [98.6 F (37 C)-98.9 F (37.2 C)] 98.8 F (37.1 C) (05/09 1143) Pulse Rate:  [58-81] 65 (05/09 1200) Cardiac Rhythm:  [-] Normal sinus rhythm (05/09 0800) Resp:  [14-32] 15 (05/09 1200) BP: (118-164)/(41-66) 123/44 mmHg (05/09 1200) SpO2:  [93 %-99 %] 96 % (05/09 1200) Weight:  [208 lb 15.9 oz (94.8 kg)] 208 lb 15.9 oz (94.8 kg) (05/09 0453)   Recent Labs Lab 05/30/14 1555 05/30/14 2027 05/30/14 2342 05/31/14 0412 05/31/14 0751  GLUCAP 105* 99 90 99 99    Recent Labs Lab 05/26/14 0330 05/27/14 0600 05/28/14 0425 05/29/14 0510 05/30/14 0520 05/31/14 0500  NA 144 143 140 137 140 140  K 3.5 3.8 3.6 3.8 3.9 3.7  CL 105 104 101 100* 100* 103  CO2 GLUCOSE 115* 129* 119* 118* 121* 98  BUN 21* 18  CREATININE 0.55 0.56 0.50 0.57 0.50 0.62  CALCIUM 9.1 9.0 8.9 9.1 9.3 9.4  MG 2.2 2.1 2.1 2.1 2.2  --   PHOS 4.4 3.7 3.8 4.0 4.0  --    No results for input(s): AST, ALT, ALKPHOS, BILITOT, PROT, ALBUMIN in the last 168 hours.  Recent Labs Lab 05/27/14 0600 05/28/14 0425 05/29/14 0510 05/30/14 0520 05/31/14 0500  WBC 13.4* 12.2* 13.0* 12.7* 12.8*  NEUTROABS 9.8* 8.7* 9.3*  --   --   HGB 10.7* 10.3* 10.6* 10.9* 11.2*  HCT 35.1* 33.8* 34.2* 35.2* 35.5*  MCV 89.3 87.1 86.6 86.5 86.6  PLT 278 273 306 319 327   No results for input(s): CKTOTAL, CKMB, CKMBINDEX, TROPONINI in the last 168 hours. No results for input(s): LABPROT, INR in the last 72 hours. No results for input(s): COLORURINE, LABSPEC, PHURINE, GLUCOSEU, HGBUR, BILIRUBINUR, KETONESUR, PROTEINUR, UROBILINOGEN, NITRITE, LEUKOCYTESUR in the last 72 hours.  Invalid input(s): APPERANCEUR     Component Value Date/Time   CHOL 242*  05/21/2014 1048   TRIG 181* 05/24/2014 0400   HDL 61 05/21/2014 1048   CHOLHDL 4.0 05/21/2014 1048   VLDL 49* 05/21/2014 1048   LDLCALC 132* 05/21/2014 1048   Lab Results  Component Value Date   HGBA1C 5.9* 05/21/2014      Component Value Date/Time   LABOPIA NONE DETECTED 05/20/2014 1513   COCAINSCRNUR NONE DETECTED 05/20/2014 1513   LABBENZ NONE DETECTED 05/20/2014 1513   AMPHETMU NONE DETECTED 05/20/2014 1513   THCU NONE DETECTED 05/20/2014 1513   LABBARB NONE DETECTED 05/20/2014 1513    No results for input(s): ETH in the last 168 hours.  I have personally reviewed the radiological images below and agree with the radiology interpretations.  Ct Head Wo Contrast 05/26/14 No significant change in volume of intraparenchymal hemorrhage. No new intracranial hemorrhage. Decreasing volume of intraventricular hemorrhage. No change in size of the cerebral ventricles. Recent nonhemorrhagic infarctions in the left occipital lobe and in the left globus pallidus/internal capsule genu.  05/21/2014   IMPRESSION: 1. Interval decompression of the left-sided cerebral hemorrhage, with subdural drain in place along the craniotomy site. The hematoma is significantly reduced in volume. There is gas in the center of the hematoma and a small amount of gas and blood products in the extra-axial space on the left. 2. Intraventricular hemorrhage in both lateral ventricles and  in the third and fourth ventricle, without hydrocephalus. 3. Faintly increased density along the right temporal lobe is compatible with a small amount of new subarachnoid hemorrhage along the right temporal lobe. 4. 3 mm of left-to-right midline shift.     05/20/2014   IMPRESSION: Large left frontal parietal intracerebral hemorrhage with a 7.5 mm of midline shift. The hemorrhage has extended into the left lateral ventricle.    Dg Chest Port 1 View  05/21/2014    IMPRESSION: NG tube placed.  Stable bibasilar atelectasis.  No evidence of  pulmonary edema.     05/20/2014   IMPRESSION: Endotracheal tube tip at the carina with the tip directed toward the right main bronchus. No edema or consolidation. No pneumothorax.    2D echo -  estimated ejection fraction was in the range of 65% to 70%  CUS - 1-39% right internal carotid artery stenosis and 40-59% left internal carotid artery stenosis. The left vertebral artery is patent with antegrade flow. Unable to visualize the right vertebral artery.   MRI brain and MRA head and neck 1. Stable appearance of residual left frontal and parietal hemorrhage following the craniotomy. 2. Areas of acute/subacute nonhemorrhagic infarct within the occipital lobes bilaterally, the posterior left temporal lobe, and the left globus pallidus. 3. 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 The configuration suggests this could be an on stable plaque. 4. Moderate to high-grade stenosis of the proximal right vertebral artery without a significant distal stenosis. 5. Mild atherosclerotic changes at the right carotid bifurcation and bilateral cavernous internal carotid arteries without other stenoses. 6. Subarachnoid hemorrhage over the right temporal lobe. 7. Intravascular enhancement in the posterior circulation territories suggests luxury perfusion or less likely meningitis. 8. Nonspecific enhancement surrounding the area of hemorrhage likely related to the recent surgery.   PHYSICAL EXAM  General - Well nourished, well developed elderly Caucasian lady, no acute distress.    Ophthalmologic - fundi not visualized due to incorporation.  Cardiovascular - Regular rate and rhythm with no murmur.  Neuro - Awake, alert, opening eyes spontaneously, inconsistenly following command with eye opening/closure and showing fingers. PERRL, pupil 3mm reactive to light. Eyes has right gaze preference but can cross midline. Not blinking to visual threat on the  right side. Right facial droop. Spontaneously moving LUE and LLE, but 0/5 RUE on pain and trace withdraw on RLE on pain stimulation, no babinski bilaterally. Reflex 1+.    ASSESSMENT/PLAN [name] is a 69 y.o. female with history of hypertension presenting with right arm weakness, severe headache, and subsequent unconsciousness.  She did not receive IV t-PA due to a large left frontal parietal intracerebral hemorrhage.  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  Repeat CT scans show new interval left parieto-occipital and basal ganglia infarcts raising concern for the initial hematoma being hemorrhagic transformation from embolic stroke. Stroke involves left parieto-occipital area, right PCA, MCA/PCA, and ACA regions, concerning for cardioembolic source. Left brain embolic stroke could also come from proximal left ICA stenosis with ulcerated plaque.  Resultant  Intubated and right hemiplegia  MRI as above concerning embolic stroke with hemorrhagic transformation  MRA neck showed 60% ICA stenosis with ulcerated plaque  Repeat CT showed smaller hematoma and decreased midline shift  Heparin subq for VTE prophylaxis DIET DYS 2 Room service appropriate?: No; Fluid consistency:: Nectar ThickNPO on tube feeding  aspirin 81 mg orally every day and clopidogrel 75 mg orally every day prior  to admission, now on ASA 81mg    EEG 05/24/14 No seizure activity noted  Continue keppra for now for seizure prophylaxis x 21 days as per NSG and CCM  For stroke etiology, pt is not anticoagulation candidate now, will hold off further embolic work up for now.  Therapy recommendations: Pending  Disposition: pending  Carotid stenosis, left  CUS showed 40-59% stenosis  MRI showed 60% stenosis with ulcerative plaques  Difficult to determine whether it is symptomatic or now since pt also had bilateral infarcts more consistent with cardioembolic stroke.  Need outpt  follow up  Respiratory failure, resolved  extubated   Tolerating well  Consider transfer out of ICU  CCM following  Hypertension  Home meds: Toprol-XL  Currently not on BP meds   stable  Close monitoring  Resume home meds if needed.  Hyperlipidemia  LDL 132, not at goal  Pt has allergy to statins  Hold off statin now  Other Stroke Risk Factors  Advanced age  Obesity, Body mass index is 35.86 kg/(m^2).   Other Active Problems  Intubated  Hyperkalemia  hyperglycemia  Mild anemia  Other Pertinent History  History of abdominal aortic aneurysm repair  Hospital day # 11  This patient is critically ill due to large ICH and possible embolic stroke with hemorrhagic transformation and at significant risk of neurological worsening, death form recurrent strokes and re-bleeding. This patient's care requires constant monitoring of vital signs, hemodynamics, respiratory and cardiac monitoring, review of multiple databases, neurological assessment, discussion with family, other specialists and medical decision making of high complexity. I spent 35 minutes of neurocritical care time in the care of this patient.  Marvel PlanJindong Shalia Bartko, MD PhD Stroke Neurology 05/31/2014 1:50 PM     To contact Stroke Continuity provider, please refer to [email or web address]. After hours, contact General Neurology

## 2014-05-31 NOTE — Evaluation (Addendum)
Clinical/Bedside Swallow Evaluation Patient Details  Name: Carolyn [name] MRN: [id number] Date of Birth: 09/11/1945  Today's Date: 05/31/2014 Time: SLP Start Time (ACUTE ONLY): [id number] SLP Stop Time (ACUTE ONLY): 0951 SLP Time Calculation (min) (ACUTE ONLY): 15 min  Past Medical History:  Past Medical History  Diagnosis Date  . Hypertension   . AAA (abdominal aortic aneurysm)     a. s/p stent grafting @ [facility].  . Hemorrhagic cerebrovascular accident (CVA)     a. 05/20/2014 L hemispheric hemorrhagic CVA s/p decompressive craniotomy and evacuation.  . H/O echocardiogram     a. 04/2014 Echo: EF 65-70%, Gr 1 DD, mild LVH.   Past Surgical History:  Past Surgical History  Procedure Laterality Date  . Abdominal aortic aneurysm repair    . Craniotomy Left 05/20/2014    Procedure: CRANIOTOMY HEMATOMA EVACUATION;  Surgeon: Aliene Beamsandy Kritzer, MD;  Location: [facility];  Service: Neurosurgery;  Laterality: Left;   HPI:  69 yr old with PMH: HTN admitted as a code stroke. CT large left frontal parietal intracerebral hemorrhage with a 7.5 mm with extention into the left lateral ventricle. Underwent craniotomy 4/28. Intubated 4/28-5/8. CXR 5/6 Persistent low lung volumes with mild stable subsegmental atelectasis and/or infiltrates and repeat CXR 5/7 the lungs are grossly clear.   Assessment / Plan / Recommendation Clinical Impression  Pt exhibits suspected pharyngeal dysphagia due to decreased laryngeal elevation and suspected delayed swallow initiation. Inhalation post swallow, aphonic (unable to assess phonation), left hemorrhage and 11 day intubation warrants objective assessment recommended with MBS scheduled today at 11:00.     Aspiration Risk   (mod-severe)    Diet Recommendation NPO (MBS)        Other  Recommendations Oral Care Recommendations: Oral care BID   Follow Up Recommendations       Frequency and Duration        Pertinent Vitals/Pain none      Swallow Study           Oral/Motor/Sensory Function Overall Oral Motor/Sensory Function:  (apraxia, difficulty initiating mvmts)   Ice Chips Ice chips: Impaired Presentation: Spoon Pharyngeal Phase Impairments: Suspected delayed Swallow;Decreased hyoid-laryngeal movement   Thin Liquid Thin Liquid: Impaired Presentation: Spoon Pharyngeal  Phase Impairments: Suspected delayed Swallow;Decreased hyoid-laryngeal movement    Nectar Thick Nectar Thick Liquid: Not tested   Honey Thick Honey Thick Liquid: Not tested   Puree Puree: Impaired Pharyngeal Phase Impairments: Suspected delayed Swallow;Decreased hyoid-laryngeal movement   Solid   GO    Solid: Not tested       Carolyn [name], Carolyn [name] 05/31/2014,10:30 AM  Carolyn CoonsLisa [name] Lonell FaceLitaker M.Ed [employer] Pager [phone]

## 2014-05-31 NOTE — Evaluation (Signed)
Speech Language Pathology Evaluation Patient Details Name: Carolyn [name] MRN: [id number] DOB: [birth year] Today's Date: 05/31/2014 Time: [id number] SLP Time Calculation (min) (ACUTE ONLY): 15 min  Problem List:  Patient Active Problem List   Diagnosis Date Noted  . Endotracheal tube present   . Stroke   . 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 Acute respiratory failure, unspecified whether with hypoxia or hypercapnia   . Acute respiratory failure   . Bradycardia 05/23/2014  . Cerebral brain hemorrhage   . Respiratory failure   . Junctional bradycardia   . Arterial hypotension   . ICH (intracerebral hemorrhage) 05/20/2014  . Hypertensive emergency 05/20/2014   Past Medical History:  Past Medical History  Diagnosis Date  . Hypertension   . AAA (abdominal aortic aneurysm)     a. s/p stent grafting @ [facility].  . Hemorrhagic cerebrovascular accident (CVA)     a. 05/20/2014 L hemispheric hemorrhagic CVA s/p decompressive craniotomy and evacuation.  . H/O echocardiogram     a. 04/2014 Echo: EF 65-70%, Gr 1 DD, mild LVH.   Past Surgical History:  Past Surgical History  Procedure Laterality Date  . Abdominal aortic aneurysm repair    . Craniotomy Left 05/20/2014    Procedure: CRANIOTOMY HEMATOMA EVACUATION;  Surgeon: Aliene Beamsandy Kritzer, MD;  Location: [facility];  Service: Neurosurgery;  Laterality: Left;   HPI:  69 yr old with PMH: HTN, AAA 2015, renal difficulty admitted as a code stroke. CT large left frontal parietal intracerebral hemorrhage with a 7.5 mm with extention into the left lateral ventricle. Underwent craniotomy 4/28. Intubated 4/28-5/8. CXR 5/6 Persistent low lung volumes with mild stable subsegmental atelectasis and/or infiltrates and repeat CXR 5/7 the lungs are grossly clear.   Assessment / Plan / Recommendation Clinical Impression  Pt demonstrated severe oral and verbal apraxia, expressive > receptive aphasia, right inattention and mild-moderate cognitive  impairments (diagnostic treatment for cognition will continue). ST intervention warranted for communicative-cognitive independence on acute venue and recommend inpatient rehab.     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)   Cognition  Overall Cognitive Status: Impaired/Different from baseline Arousal/Alertness: Awake/alert Orientation Level: Other (comment) (UTA) Attention: Sustained Sustained Attention: Impaired Sustained Attention Impairment: Verbal basic Memory:  (TBA) Awareness: Impaired Awareness Impairment: Intellectual impairment;Emergent impairment;Anticipatory impairment Problem Solving: Impaired Problem Solving Impairment: Verbal basic;Functional basic Safety/Judgment: Impaired    Comprehension  Auditory Comprehension Overall Auditory Comprehension: Impaired Yes/No Questions: Impaired Basic Biographical Questions: 51-75% accurate Commands: Impaired One Step Basic Commands: 0-24% accurate (motor and oral apraxia) Interfering Components: Attention;Processing speed;Motor planning Visual Recognition/Discrimination Discrimination: Not tested Reading Comprehension Reading Status:  (TBA)    Expression Expression Primary Mode of Expression:  (gestures, mouthing words) Verbal Expression Overall Verbal Expression: Impaired Initiation: Impaired Automatic Speech:  (mouthed "happy birthday" with 75% accuracy) Repetition:  (TBA) Naming: Impairment Responsive: 0-25% accurate Confrontation: Impaired  (0%) Convergent: 0-24% accurate (0%) Divergent: Not tested Verbal Errors:  (mouthed single word x 3) Pragmatics: No impairment Interfering Components: Attention Written Expression Dominant Hand: Right Written Expression: Not tested   Oral / Motor Oral Motor/Sensory Function Overall  Oral Motor/Sensory Function:  (apraxia, difficulty initiating mvmts) Motor Speech Overall Motor Speech: Impaired Respiration: Within functional limits Phonation: Aphonic Resonance:  (UTA) Articulation:  (UTA) Intelligibility: Unable to assess (comment) Motor Planning: Impaired Level of Impairment: Word Motor Speech Errors: Unaware   GO     Royce MacadamiaLitaker, Avory Rahimi Willis 05/31/2014, 11:59 AM  Breck CoonsLisa Willis Lonell FaceLitaker M.Ed [employer]-SLP Pager [phone]

## 2014-05-31 NOTE — Progress Notes (Signed)
MBSS complete. Full report located under chart review in imaging section. Demyah Smyre, MA CCC-SLP [phone]  

## 2014-05-31 NOTE — Progress Notes (Signed)
Rehab Admissions Coordinator Note:  Patient was screened by Trish MageLogue, Corby Villasenor M for appropriateness for an Inpatient Acute Rehab Consult.  At this time, we are recommending Inpatient Rehab consult.  Lelon FrohlichLogue, Zekiah Caruth M 05/31/2014, 3:21 PM  I can be reached at [phone].

## 2014-05-31 NOTE — Evaluation (Signed)
Physical Therapy Evaluation Patient Details Name: Carolyn [name] MRN: [id number] DOB: [birth year] Today's Date: 05/31/2014   History of Present Illness  69 yr old with PMH: HTN, AAA 2015, renal difficulty admitted as a code stroke. CT large left frontal parietal intracerebral hemorrhage with a 7.5 mm with extention into the left lateral ventricle. Underwent craniotomy 4/28. Intubated 4/28-5/8.  Repeat CT scans show new interal left parieto-occipital and basal ganglia infarcts raising concern for the initial hematoma being hemorrhagic transformation from embolic stroke.  Stroke involves left parieto-occipital area, Rt PCA, MCA/PCA, and ACA regions.  Clinical Impression  Pt admitted with/for ICH and underwent the following procedures above.  Pt currently limited functionally due to the problems listed. ( See problems list.)   Pt will benefit from PT to maximize function and safety in order to get ready for next venue listed below. OT/PT collaborated in a session earlier in the morning and were interrupted by Radiology/MBS.  PT came back in the afternoon and saw pt separate from OT; information from 2nd session in this note.    Follow Up Recommendations CIR    Equipment Recommendations  Other (comment) (TBA next venue)    Recommendations for Other Services Rehab consult     Precautions / Restrictions Precautions Precautions: Fall Restrictions Weight Bearing Restrictions: No      Mobility  Bed Mobility Overal bed mobility: Needs Assistance Bed Mobility: Sit to Supine;Supine to Sit     Supine to sit: Total assist;+2 for safety/equipment Sit to supine: Total assist;+2 for safety/equipment   General bed mobility comments: pt too fatigued to assist coming to EOB  Transfers Overall transfer level: Needs assistance               General transfer comment: pt too lethargic to attempt stand this afternoon  Ambulation/Gait                Stairs            Wheelchair  Mobility    Modified Rankin (Stroke Patients Only) Modified Rankin (Stroke Patients Only) Pre-Morbid Rankin Score: No symptoms Modified Rankin: Severe disability     Balance Overall balance assessment: Needs assistance Sitting-balance support: Feet supported;Single extremity supported   Sitting balance - Comments: requires max A.  Pt with decreased activation of Rt side of trunk causing heavy lean                                      Pertinent Vitals/Pain Pain Assessment: Faces Faces Pain Scale: No hurt    Home Living Family/patient expects to be discharged to:: Private residence   Available Help at Discharge: Family;Available 24 hours/day Type of Home: House Home Access: Stairs to enter   Entergy CorporationEntrance Stairs-Number of Steps: threshold  Home Layout: One level Home Equipment: Shower seat;Grab bars - tub/shower Additional Comments: Pt lived alone.  Her family is investigating options to provide 24 hour assist     Prior Function Level of Independence: Independent         Comments: Pt was fully independent.  She worked full time as an Engineer, civil (consulting)administrative assistant      Hand Dominance   Dominant Hand: Right    Extremity/Trunk Assessment     RUE Deficits / Details: Appears flaccid.   Does not attempt to move or use it.  PROM WFL          Lower Extremity Assessment: RLE deficits/detail;LLE deficits/detail  RLE Deficits / Details: noted spontaneous flexion of leg, nothing to command    Cervical / Trunk Assessment: Other exceptions  Communication   Communication: Expressive difficulties;Receptive difficulties  Cognition Arousal/Alertness: Awake/alert;Lethargic Behavior During Therapy: Flat affect Overall Cognitive Status: Difficult to assess                      General Comments      Exercises Other Exercises Other Exercises: R LE ROM exercise      Assessment/Plan    PT Assessment Patient needs continued PT services  PT Diagnosis  Generalized weakness;Hemiplegia dominant side   PT Problem List Decreased strength;Decreased activity tolerance;Decreased balance;Decreased mobility;Decreased coordination;Decreased safety awareness;Decreased knowledge of precautions  PT Treatment Interventions Gait training;Functional mobility training;Therapeutic activities;Therapeutic exercise;Balance training;Neuromuscular re-education;Patient/family education   PT Goals (Current goals can be found in the Care Plan section) Acute Rehab PT Goals Patient Stated Goal: For pt to regain as much independence as possible (per daughter) PT Goal Formulation: Patient unable to participate in goal setting Time For Goal Achievement: 06/14/14 Potential to Achieve Goals: Fair    Frequency Min 3X/week   Barriers to discharge        Co-evaluation   Reason for Co-Treatment: Complexity of the patient's impairments (multi-system involvement)           End of Session   Activity Tolerance: Patient tolerated treatment well;Patient limited by fatigue Patient left: in bed;with call bell/phone within reach;with bed alarm set Nurse Communication: Mobility status         Time: 1520-1540 (10:35 to 10:45) PT Time Calculation (min) (ACUTE ONLY): 20 min   Charges:   PT Evaluation $Initial PT Evaluation Tier I: 1 Procedure PT Treatments $Therapeutic Activity: 8-22 mins   PT G Codes:        Carolyn [name], Eliseo GumKenneth V 05/31/2014, 6:30 PM 05/31/2014  [place] BingKen Garrit Marrow, PT [phone]  (pager)

## 2014-05-31 NOTE — Progress Notes (Signed)
UR completed.  Pt has CIR consult placed. Await PT eval/recommendation.  Carlyle LipaMichelle Nekisha Mcdiarmid, RN BSN MHA CCM Trauma/Neuro ICU Case Manager [phone]

## 2014-05-31 NOTE — Evaluation (Signed)
Occupational Therapy Evaluation Patient Details Name: Carolyn [name] MRN: [id number] DOB: 1945-07-17 Today's Date: 05/31/2014    History of Present Illness 69 yr old with PMH: HTN, AAA 2015, renal difficulty admitted as a code stroke. CT large left frontal parietal intracerebral hemorrhage with a 7.5 mm with extention into the left lateral ventricle. Underwent craniotomy 4/28. Intubated 4/28-5/8.  Repeat CT scans show new interal left parieto-occipital and basal ganglia infarcts raising concern for the initial hematoma being hemorrhagic transformation from embolic stroke.  Stroke involves left parieto-occipital area, Rt PCA, MCA/PCA, and ACA regions.   Clinical Impression   Pt admitted with above. She demonstrates the below listed deficits and will benefit from continued OT to maximize safety and independence with BADLs,  Pt presents with Rt hemiplegia, Lt gaze preference with Lt inattention, impaired communication.  Currently, she requires total A for ADLs and max A for bed mobility.  Family is in process of trying to set up 24 hour care at discharge.  She is going to need extensive post acute rehab.  Recommend CIR.  Will follow acutely.       Follow Up Recommendations  CIR;Supervision/Assistance - 24 hour    Equipment Recommendations  Other (comment) (TBD by next venue )    Recommendations for Other Services Rehab consult     Precautions / Restrictions Precautions Precautions: Fall Restrictions Weight Bearing Restrictions: No      Mobility Bed Mobility Overal bed mobility: Needs Assistance Bed Mobility: Sit to Supine;Supine to Sit     Supine to sit: Max assist Sit to supine: Max assist   General bed mobility comments: Pt able to minimally assist with pushing trunk up into sitting.  As she fatigued, she lowered trunk to bed and attempted to left LEs onto bed.  No spontaneous movement noted Rt LE   Transfers Overall transfer level: Needs assistance                General transfer comment: unable to safely attempt with +1 assist     Balance Overall balance assessment: Needs assistance Sitting-balance support: Feet supported Sitting balance-Leahy Scale: Poor Sitting balance - Comments: requires mod A.  Pt with decreased activation of Rt side of trunk causing heavy lean  Postural control: Right lateral lean                                  ADL Overall ADL's : Needs assistance/impaired Eating/Feeding: Maximal assistance;Bed level   Grooming: Wash/dry hands;Brushing hair;Total assistance;Bed level;Sitting Grooming Details (indicate cue type and reason): Pt took comb from therapist, but made no attempt to use it. Attempted Hand over hand assist, but pt resists activity  Upper Body Bathing: Total assistance;Sitting;Bed level   Lower Body Bathing: Total assistance;Bed level   Upper Body Dressing : Total assistance;Bed level   Lower Body Dressing: Total assistance;Bed level   Toilet Transfer: Total assistance Toilet Transfer Details (indicate cue type and reason): unable to safely perform with +1 assist  Toileting- Clothing Manipulation and Hygiene: Total assistance;Bed level       Functional mobility during ADLs: Total assistance       Vision Vision Assessment?: Yes Additional Comments: Pt unable to participate in formal assessment due to communication deficits.  She demonstrates Lt gaze preference.  With max cues, she will track therapist to her Rt and make eye contact with therapist on the Rt.    Perception Perception Perception Tested?: Yes Perception Deficits:  Inattention/neglect   Praxis Praxis Praxis tested?: Deficits Deficits: Ideomotor;Ideation    Pertinent Vitals/Pain Pain Assessment: Faces Faces Pain Scale: No hurt     Hand Dominance Right   Extremity/Trunk Assessment Upper Extremity Assessment Upper Extremity Assessment: RUE deficits/detail RUE Deficits / Details: Appears flaccid.   Does not attempt  to move or use it.  PROM WFL  RUE Coordination: decreased fine motor;decreased gross motor   Lower Extremity Assessment Lower Extremity Assessment: Defer to PT evaluation   Cervical / Trunk Assessment Cervical / Trunk Assessment: Other exceptions Cervical / Trunk Exceptions: Pt with passive lateral flexion Rt.  Maintains posterior pevlic tilt with thoracic flexion    Communication Communication Communication: Expressive difficulties;Receptive difficulties   Cognition Arousal/Alertness: Awake/alert;Lethargic Behavior During Therapy: Flat affect Overall Cognitive Status: Difficult to assess                     General Comments       Exercises Exercises: General Upper Extremity     Shoulder Instructions      Home Living Family/patient expects to be discharged to:: Private residence   Available Help at Discharge: Family;Available 24 hours/day Type of Home: House Home Access: Stairs to enter Entergy CorporationEntrance Stairs-Number of Steps: threshold    Home Layout: One level     Bathroom Shower/Tub: Tub/shower unit;Walk-in shower Shower/tub characteristics: Engineer, building servicesCurtain Bathroom Toilet: Standard     Home Equipment: Shower seat;Grab bars - tub/shower   Additional Comments: Pt lived alone.  Her family is investigating options to provide 24 hour assist       Prior Functioning/Environment Level of Independence: Independent        Comments: Pt was fully independent.  She worked full time as an Regulatory affairs officeradministrative assistant     OT Diagnosis: Generalized weakness;Cognitive deficits;Disturbance of vision;Hemiplegia dominant side   OT Problem List: Decreased strength;Decreased range of motion;Decreased activity tolerance;Impaired balance (sitting and/or standing);Impaired vision/perception;Decreased coordination;Decreased cognition;Decreased safety awareness;Decreased knowledge of use of DME or AE;Impaired sensation;Impaired tone;Obesity;Impaired UE functional use   OT  Treatment/Interventions: Self-care/ADL training;Neuromuscular education;DME and/or AE instruction;Manual therapy;Therapeutic activities;Splinting;Cognitive remediation/compensation;Visual/perceptual remediation/compensation;Patient/family education;Balance training    OT Goals(Current goals can be found in the care plan section) Acute Rehab OT Goals Patient Stated Goal: For pt to regain as much independence as possible (per daughter) OT Goal Formulation: With family Time For Goal Achievement: 06/14/14 Potential to Achieve Goals: Good ADL Goals Pt Will Perform Eating: with min assist;sitting Pt Will Perform Grooming: with min assist;sitting Pt Will Perform Upper Body Bathing: with mod assist;sitting Pt Will Transfer to Toilet: with mod assist;squat pivot transfer;stand pivot transfer;bedside commode Additional ADL Goal #1: Pt will scan to left to locate food items and grooming items with min A during ADL tasks  Additional ADL Goal #2: Family will be independent with PROM Rt UE   OT Frequency: Min 3X/week   Barriers to D/C: Decreased caregiver support          Co-evaluation PT/OT/SLP Co-Evaluation/Treatment: Yes Reason for Co-Treatment: Complexity of the patient's impairments (multi-system involvement)   OT goals addressed during session: ADL's and self-care      End of Session Nurse Communication: Mobility status  Activity Tolerance: Patient limited by lethargy Patient left: in bed;with call bell/phone within reach   Time: 1343-1403 OT Time Calculation (min): 20 min Charges:  OT General Charges $OT Visit: 1 Procedure OT Evaluation $Initial OT Evaluation Tier I: 1 Procedure OT Treatments $Neuromuscular Re-education: 8-22 mins G-Codes:    Carlo Lorson M 05/31/2014, 3:07 PM

## 2014-05-31 NOTE — Progress Notes (Signed)
Nutrition Follow-up  DOCUMENTATION CODES:  Obesity unspecified  INTERVENTION:  Magic cup TID with meals.   NUTRITION DIAGNOSIS:  Inadequate oral intake related to inability to eat as evidenced by NPO status.  Progressing (diet just advanced)  GOAL:  Patient will meet greater than or equal to 90% of their needs  Not met.   MONITOR:  PO intake, Supplement acceptance, Diet advancement, I & O's  REASON FOR ASSESSMENT:  Consult Enteral/tube feeding initiation and management  ASSESSMENT:  21 F with PMH of htn and recent stent of AAA performed @ [facility] presented to the ED 4/28 with L hemispheric hemorrhagic CVA. Intubated in ED due to depressed LOC   Pt discussed during ICU rounds and with RN.  Pt extubated 5/8 now s/p MBS, SLP recommends Dysphagia II with Nectar thickened liquids.    Height:  Ht Readings from Last 1 Encounters:  05/20/14 _0  (1.626 m)    Weight:  Wt Readings from Last 1 Encounters:  05/31/14 208 lb 15.9 oz (94.8 kg)    Ideal Body Weight:  54.5 kg  Wt Readings from Last 10 Encounters:  05/31/14 208 lb 15.9 oz (94.8 kg)    BMI:  Body mass index is 35.86 kg/(m^2).  Estimated Nutritional Needs:  Kcal:  1650-1800  Protein:  80-90 grams  Fluid:  > 1.6 L/day  Skin:  Reviewed, no issues  Diet Order:     EDUCATION NEEDS:  No education needs identified at this time   Intake/Output Summary (Last 24 hours) at 05/31/14 1240 Last data filed at 05/31/14 1100  Gross per 24 hour  Intake   1490 ml  Output   2475 ml  Net   -985 ml    Last BM:  5/8  Maylon Peppers RD, [place], [place] Pager [phone] After Hours Pager

## 2014-05-31 NOTE — Progress Notes (Signed)
Patient ID: Carolyn [name], female   DOB: 1945/11/16, 69 y.o.   MRN: [id number] Afeb, vss Now extubated and looks comfortable E She is awake, and alert. She is following some simple commands on the left side, and will mimic as well. Will continue present rx. Will be an excellent rehab candidate.

## 2014-06-01 ENCOUNTER — Inpatient Hospital Stay (HOSPITAL_COMMUNITY): Payer: BLUE CROSS/BLUE SHIELD

## 2014-06-01 DIAGNOSIS — I639 Cerebral infarction, unspecified: Secondary | ICD-10-CM | POA: Insufficient documentation

## 2014-06-01 DIAGNOSIS — G8191 Hemiplegia, unspecified affecting right dominant side: Secondary | ICD-10-CM | POA: Insufficient documentation

## 2014-06-01 DIAGNOSIS — R5381 Other malaise: Secondary | ICD-10-CM

## 2014-06-01 DIAGNOSIS — R4701 Aphasia: Secondary | ICD-10-CM | POA: Insufficient documentation

## 2014-06-01 DIAGNOSIS — G8190 Hemiplegia, unspecified affecting unspecified side: Secondary | ICD-10-CM

## 2014-06-01 DIAGNOSIS — I950 Idiopathic hypotension: Secondary | ICD-10-CM

## 2014-06-01 DIAGNOSIS — E785 Hyperlipidemia, unspecified: Secondary | ICD-10-CM | POA: Insufficient documentation

## 2014-06-01 LAB — BASIC METABOLIC PANEL
Anion gap: 13 (ref 5–15)
BUN: 20 mg/dL (ref 6–20)
CO2: 25 mmol/L (ref 22–32)
CREATININE: 0.59 mg/dL (ref 0.44–1.00)
Calcium: 9.1 mg/dL (ref 8.9–10.3)
Chloride: 104 mmol/L (ref 101–111)
GFR calc Af Amer: >60 mL/min (ref >60)
GLUCOSE: 92 mg/dL (ref 70–99)
Potassium: 3.6 mmol/L (ref 3.5–5.1)
Sodium: 142 mmol/L (ref 135–145)

## 2014-06-01 MED ORDER — LEVOTHYROXINE SODIUM 75 MCG PO TABS
75.0000 ug | ORAL_TABLET | Freq: Every day | ORAL | Status: DC
  Administered 2014-06-02 – 2014-06-03 (×2): 75 ug via ORAL
  Filled 2014-06-01 (×3): qty 1

## 2014-06-01 MED ORDER — ADULT MULTIVITAMIN W/MINERALS CH
1.0000 | ORAL_TABLET | Freq: Every day | ORAL | Status: DC
  Administered 2014-06-01 – 2014-06-03 (×3): 1 via ORAL
  Filled 2014-06-01 (×3): qty 1

## 2014-06-01 MED ORDER — METOPROLOL SUCCINATE ER 25 MG PO TB24
25.0000 mg | ORAL_TABLET | Freq: Every day | ORAL | Status: DC
  Administered 2014-06-01 – 2014-06-03 (×3): 25 mg via ORAL
  Filled 2014-06-01 (×3): qty 1

## 2014-06-01 NOTE — Progress Notes (Signed)
Inpatient Rehabilitation  I visited the patient at the bedside then phoned her daughter Andris Baumannlicia Hemrick [phone] cell) to discuss pt's need for post acute rehabilitation and possible venues.  I explained about the IP Rehab program and advised Helmut Musterlicia that I left brochures on pt's table for the family.  Helmut Musterlicia stated that she wants to discuss post acute rehab plans (CIR vs. SNF)  with her 2 brothers.  I will follow up tomorrow with Helmut MusterAlicia when she makes her am visit to her mom to assist in DC planning.  Please call if questions.  Weldon PickingSusan Almee Pelphrey PT Inpatient Rehab Admissions Coordinator Cell [phone] Office [phone]

## 2014-06-01 NOTE — Progress Notes (Signed)
PULMONARY  / CRITICAL CARE MEDICINE  Name: Carolyn [name] MRN: [id number] DOB: May 03, 1945    LOS: 12  REFERRING MD :  Dr. Roseanne Reno  CHIEF COMPLAINT:  ICH  BRIEF PATIENT DESCRIPTION:  29 F with PMH of htn and recent stent of AAA performed @ [facility] presented to the ED 4/28 with L hemispheric hemorrhagic CVA. Intubated in ED due to depressed LOC and admitted to [facility] service with Stroke and NS consultants. 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    MAJOR EVENTS/TEST RESULTS: 4/28 admitted as above to [facility] service. Stroke team consultation. NS consultation 4/28 CT head: Large left frontal parietal intracerebral hemorrhage with a 7.5 mm of midline shift. The hemorrhage has extended into the left lateral ventricle 4/28 Procedure: Left frontoparietal craniotomy for evacuation of intracerebral hematoma. Surgeon: Gerlene Fee 4/29 CT head: Interval decompression of the left-sided cerebral hemorrhage, with subdural drain in place along the craniotomy site. The hematoma is significantly reduced in volume. There is gas in the center of the hematoma and a small amount of gas and blood products in the extra-axial space on the left. Intraventricular hemorrhage in both lateral ventricles and in the third and fourth ventricle, without hydrocephalus. Faintly increased density along the right temporal lobe is compatible with a small amount of new subarachnoid hemorrhage along the right temporal lobe. 3 mm of left-to-right midline shift 4/29 Remains intubated, sedated, on nicardipine, propofol 4/29 TTE: nml LV fn 4/30 CT head: "improved" 5/1 head CT (brady) >> no change 05/25/14: Afebrile. A-line BP reading 20 points higher than cuff 05/28/14: Opens eyes and squeezes hand but not wiggle toes on left.  5/8  Extubated  SUBJECTIVE: Remains extubated  VITAL SIGNS: Temp:  [98 F (36.7 C)-98.8 F (37.1 C)] 98 F (36.7 C) (05/10 0722) Pulse Rate:  [63-84] 74 (05/10 0722) Resp:  [12-21] 16  (05/10 0722) BP: (123-163)/(44-81) 163/62 mmHg (05/10 0722) SpO2:  [95 %-100 %] 95 % (05/10 0722) VENTILATOR SETTINGS:     INTAKE / OUTPUT: Intake/Output      05/09 0701 - 05/10 0700 05/10 0701 - 05/11 0700   P.O.  240   I.V. (mL/kg) 1080 (11.4)    NG/GT     IV Piggyback 105    Total Intake(mL/kg) 1185 (12.5) 240 (2.5)   Urine (mL/kg/hr) 750 (0.3)    Stool     Total Output 750     Net +435 +240        Urine Occurrence 3 x 2 x    PHYSICAL EXAMINATION: General: no distress Neuro: moves left side, follows some commands HEENT: wound site clean Cardiovascular:  s1 s2 regular Lungs: reduced bases Abdomen:  Obese, soft, +BS Ext: warm, no edema  LABS:  PULMONARY  Recent Labs Lab 05/26/14 0320 05/28/14 0444 05/29/14 0320  PHART 7.412 7.437 7.425  PCO2ART 45.2* 44.4 45.0  PO2ART 72.0* 152* 121*  HCO3 28.6* 29.4* 29.0*  TCO2 30 30.7 30.3  O2SAT 94.0 98.7 97.9   CBC  Recent Labs Lab 05/29/14 0510 05/30/14 0520 05/31/14 0500  HGB 10.6* 10.9* 11.2*  HCT 34.2* 35.2* 35.5*  WBC 13.0* 12.7* 12.8*  PLT 306 319 327    CHEMISTRY  Recent Labs Lab 05/26/14 0330 05/27/14 0600 05/28/14 0425 05/29/14 0510 05/30/14 0520 05/31/14 0500 06/01/14 0451  NA 144 143 140 137 140 140 142  K 3.5 3.8 3.6 3.8 3.9 3.7 3.6  CL 105 104 101 100* 100* 103 104  CO2 GLUCOSE  115* 129* 119* 118* 121* 98 92  BUN 21* 18 20  CREATININE 0.55 0.56 0.50 0.57 0.50 0.62 0.59  CALCIUM 9.1 9.0 8.9 9.1 9.3 9.4 9.1  MG 2.2 2.1 2.1 2.1 2.2  --   --   PHOS 4.4 3.7 3.8 4.0 4.0  --   --    Estimated Creatinine Clearance: 75.1 mL/min (by C-G formula based on Cr of 0.59).   ENDOCRINE CBG (last 3)   Recent Labs  05/30/14 2342 05/31/14 0412 05/31/14 0751  GLUCAP 90 99 99   IMAGING x48h Dg Chest Port 1 View  06/01/2014   CLINICAL DATA:  Atelectasis.  EXAM: PORTABLE CHEST - 1 VIEW  COMPARISON:  05/31/2014 .  FINDINGS: Left PICC line in stable position.  Heart size stable with mild cardiomegaly. Pulmonary vascularity normal. Lungs are clear of infiltrates. No pleural effusion or pneumothorax.  IMPRESSION: 1. PICC line in stable position. 2. No acute cardiopulmonary disease.   Electronically Signed   By: Maisie Fus  Register   On: 06/01/2014 07:35   Dg Chest Port 1 View  05/31/2014   CLINICAL DATA:  Respiratory failure  EXAM: PORTABLE CHEST - 1 VIEW  COMPARISON:  05/29/2014  FINDINGS: Left upper extremity PICC, tip at the upper SVC.  Borderline cardiomegaly, accentuated by technique. Stable mild aortic tortuosity. Stable inflation after tracheal and esophageal extubation, with mild residual interstitial crowding at the left base. There is no suspected edema, consolidation, effusion, or pneumothorax.  IMPRESSION: Stable inflation post extubation.   Electronically Signed   By: Marnee Kocher M.D.   On: 05/31/2014 07:54   Dg Swallowing Func-speech Pathology  05/31/2014    Objective Swallowing Evaluation:    Patient Details  Name: Carolyn [name] MRN: [id number] Date of Birth: 05-18-1945  Today's Date: 05/31/2014 Time: SLP Start Time (ACUTE ONLY): 1110-SLP Stop Time (ACUTE ONLY): 1130 SLP Time Calculation (min) (ACUTE ONLY): 20 min  Past Medical History:  Past Medical History  Diagnosis Date  . Hypertension   . AAA (abdominal aortic aneurysm)     a. s/p stent grafting @ [facility].  . Hemorrhagic cerebrovascular accident (CVA)     a. 05/20/2014 L hemispheric hemorrhagic CVA s/p decompressive craniotomy  and evacuation.  . H/O echocardiogram     a. 04/2014 Echo: EF 65-70%, Gr 1 DD, mild LVH.   Past Surgical History:  Past Surgical History  Procedure Laterality Date  . Abdominal aortic aneurysm repair    . Craniotomy Left 05/20/2014    Procedure: CRANIOTOMY HEMATOMA EVACUATION;  Surgeon: Aliene Beams, MD;   Location: [facility];  Service: Neurosurgery;  Laterality: Left;   HPI:  Other Pertinent Information: 69 yr old with PMH: HTN, AAA 2015, renal  difficulty admitted as a  code stroke. CT large left frontal parietal  intracerebral hemorrhage with a 7.5 mm with extention into the left  lateral ventricle. Underwent craniotomy 4/28. Intubated 4/28-5/8. CXR 5/6  Persistent low lung volumes with mild stable subsegmental atelectasis  and/or infiltrates and repeat CXR 5/7 the lungs are grossly clear.  No Data Recorded  Assessment / Plan / Recommendation CHL IP CLINICAL IMPRESSIONS 05/31/2014  Therapy Diagnosis Moderate oral phase dysphagia;Mild pharyngeal phase  dysphagia  Clinical Impression Pt presents with a primary oral phase dysphagia with  apraxia and right labial and buccal weakness and sensory deficits  resulting in severe anterior spillage and buccal residuals on the right.  Lingual manipulation for mastication of solids is impaired; pt transited  unmasticated cookie.  Oropharyngeal phase is characterized by normal  strength and only trace flash penetration of thin liquids. Pt did not  aspirate during the study, thus laryngeal sensation is undetermined.  Recommend conservative diet of Dys 2 (ground) with nectar thick liquids to  allow for SLP observation during meal to ensure tolerance and upgrade to  thin if arousal is stable. Family in agreement.       CHL IP TREATMENT RECOMMENDATION 05/31/2014  Treatment Recommendations Therapy as outlined in treatment plan below     CHL IP DIET RECOMMENDATION 05/31/2014  SLP Diet Recommendations Dysphagia 2 (Fine chop);Nectar  Liquid Administration via (None)  Medication Administration Crushed with puree  Compensations Slow rate;Small sips/bites;Check for pocketing;Check for  anterior loss  Postural Changes and/or Swallow Maneuvers (None)     CHL IP OTHER RECOMMENDATIONS 05/31/2014  Recommended Consults (None)  Oral Care Recommendations Oral care BID  Other Recommendations Order thickener from pharmacy;Have oral suction  available     CHL IP FOLLOW UP RECOMMENDATIONS 05/31/2014  Follow up Recommendations Inpatient Rehab     CHL IP FREQUENCY AND DURATION  05/31/2014  Speech Therapy Frequency (ACUTE ONLY) (None)  Treatment Duration 2 weeks     Pertinent Vitals/Pain NA    SLP Swallow Goals No flowsheet data found.  No flowsheet data found.    CHL IP REASON FOR REFERRAL 05/31/2014  Reason for Referral Objectively evaluate swallowing function     CHL IP ORAL PHASE 05/31/2014  Lips (None)  Tongue (None)  Mucous membranes (None)  Nutritional status (None)  Other (None)  Oxygen therapy (None)  Oral Phase Impaired  Oral - Pudding Teaspoon (None)  Oral - Pudding Cup (None)  Oral - Honey Teaspoon (None)  Oral - Honey Cup (None)  Oral - Honey Syringe (None)  Oral - Nectar Teaspoon (None)  Oral - Nectar Cup (None)  Oral - Nectar Straw (None)  Oral - Nectar Syringe (None)  Oral - Ice Chips (None)  Oral - Thin Teaspoon (None)  Oral - Thin Cup (None)  Oral - Thin Straw (None)  Oral - Thin Syringe (None)  Oral - Puree (None)  Oral - Mechanical Soft (None)  Oral - Regular (None)  Oral - Multi-consistency (None)  Oral - Pill (None)  Oral Phase - Comment (None)      CHL IP PHARYNGEAL PHASE 05/31/2014  Pharyngeal Phase Impaired  Pharyngeal - Pudding Teaspoon (None)  Penetration/Aspiration details (pudding teaspoon) (None)  Pharyngeal - Pudding Cup (None)  Penetration/Aspiration details (pudding cup) (None)  Pharyngeal - Honey Teaspoon (None)  Penetration/Aspiration details (honey teaspoon) (None)  Pharyngeal - Honey Cup (None)  Penetration/Aspiration details (honey cup) (None)  Pharyngeal - Honey Syringe (None)  Penetration/Aspiration details (honey syringe) (None)  Pharyngeal - Nectar Teaspoon (None)  Penetration/Aspiration details (nectar teaspoon) (None)  Pharyngeal - Nectar Cup (None)  Penetration/Aspiration details (nectar cup) (None)  Pharyngeal - Nectar Straw (None)  Penetration/Aspiration details (nectar straw) (None)  Pharyngeal - Nectar Syringe (None)  Penetration/Aspiration details (nectar syringe) (None)  Pharyngeal - Ice Chips (None)  Penetration/Aspiration details (ice chips)  (None)  Pharyngeal - Thin Teaspoon (None)  Penetration/Aspiration details (thin teaspoon) (None)  Pharyngeal - Thin Cup (None)  Penetration/Aspiration details (thin cup) (None)  Pharyngeal - Thin Straw (None)  Penetration/Aspiration details (thin straw) (None)  Pharyngeal - Thin Syringe (None)  Penetration/Aspiration details (thin syringe') (None)  Pharyngeal - Puree (None)  Penetration/Aspiration details (puree) (None)  Pharyngeal - Mechanical Soft (None)  Penetration/Aspiration details (mechanical soft) (None)  Pharyngeal - Regular (None)  Penetration/Aspiration details (regular) (None)  Pharyngeal - Multi-consistency (None)  Penetration/Aspiration details (multi-consistency) (None)  Pharyngeal - Pill (None)  Penetration/Aspiration details (pill) (None)  Pharyngeal Comment (None)      No flowsheet data found.  No flowsheet data found.        Harlon DittyBonnie DeBlois, [place]MA CCC-SLP [phone]  Dyanne IhaDeBlois, Riley NearingBonnie Caroline 05/31/2014, 12:19 PM    I reviewed CXR myself, PICC line in good position with no acute disease noted.  ASSESSMENT / PLAN:  PULMONARY A: Acute respiratory failure in setting of CVA. P: IS as able pcxr for atx showed resolution, but continue IS.  CARDIOVASCULAR A: HTN emergency. Hx of AAA s/p repair. Hypotension, junctional bradycardia >> improved. BP rising, history of junctional brady. P:  Tele Low dose clonidine hydralazine ordered.  RENAL A: New hypokalemia. P: No role hypernatremia this far out Chem in am  Replace K. Check Mg and Phos.  GASTROINTESTINAL A: Nutrition off dysphagia passed modified study P:   Swallow assessment SLP- dysphagia 2 ordered  Protonix for SUP  HEMATOLOGIC A: Anemia of critical illness. P: SQ heparin  INFECTIOUS A: No evidence for infection. P: Monitor temp, WBC count  ENDOCRINE A: Hx of hypothyroidism.  Hyperglycemia. P: Levothyroxine SSI, may dc with TF off  NEUROLOGIC A: Lt frontal parietal ICH 2nd to HTN emergency >> s/p  craniotomy 4/28.   P: RASS goal: 0 Keppra per neurology, consider 21 days then dc PT consulted OT  Transferred from ICU to SDU,  Will likely transfer to CIR once approved.  Discussed with bedside RN and CCM NP.  Alyson ReedyWesam G. Hisayo Delossantos, M.D. [facility]. Pager: [phone]. After hours pager: [phone].

## 2014-06-01 NOTE — Progress Notes (Signed)
Chaplain initiated follow up with pt and family. Chaplain informed pt family of continued chaplain support should they need it. Page chaplain as needed.    06/01/14 0900  Clinical Encounter Type  Visited With Patient and family together  Visit Type Follow-up;Spiritual support  Spiritual Encounters  Spiritual Needs Emotional  Stress Factors  Family Stress Factors Major life changes  Xaden Kaufman, Mayer MaskerCourtney F, Chaplain 06/01/2014 9:59 AM

## 2014-06-01 NOTE — Progress Notes (Signed)
STROKE TEAM PROGRESS NOTE   SUBJECTIVE (INTERVAL HISTORY) No family is at bedside. She was transferred to step down unit yesterday. She is awake, open eyes and smile to physician, but still global aphasic with right hemiplegia. Passed swallow yesterday.   OBJECTIVE Temp:  [98 F (36.7 C)-98.2 F (36.8 C)] 98.2 F (36.8 C) (05/10 1230) Pulse Rate:  [63-84] 82 (05/10 1230) Cardiac Rhythm:  [-] Normal sinus rhythm (05/10 0722) Resp:  [12-18] 16 (05/10 1230) BP: (142-163)/(57-81) 142/69 mmHg (05/10 1230) SpO2:  [94 %-100 %] 94 % (05/10 1230)   Recent Labs Lab 05/30/14 1555 05/30/14 2027 05/30/14 2342 05/31/14 0412 05/31/14 0751  GLUCAP 105* 99 90 99 99    Recent Labs Lab 05/26/14 0330 05/27/14 0600 05/28/14 0425 05/29/14 0510 05/30/14 0520 05/31/14 0500 06/01/14 0451  NA 144 143 140 137 140 140 142  K 3.5 3.8 3.6 3.8 3.9 3.7 3.6  CL 105 104 101 100* 100* 103 104  CO2 29 31 29 30 30 28 25   GLUCOSE 115* 129* 119* 118* 121* 98 92  BUN 19 19 18 20  21* 18 20  CREATININE 0.55 0.56 0.50 0.57 0.50 0.62 0.59  CALCIUM 9.1 9.0 8.9 9.1 9.3 9.4 9.1  MG 2.2 2.1 2.1 2.1 2.2  --   --   PHOS 4.4 3.7 3.8 4.0 4.0  --   --    No results for input(s): AST, ALT, ALKPHOS, BILITOT, PROT, ALBUMIN in the last 168 hours.  Recent Labs Lab 05/27/14 0600 05/28/14 0425 05/29/14 0510 05/30/14 0520 05/31/14 0500  WBC 13.4* 12.2* 13.0* 12.7* 12.8*  NEUTROABS 9.8* 8.7* 9.3*  --   --   HGB 10.7* 10.3* 10.6* 10.9* 11.2*  HCT 35.1* 33.8* 34.2* 35.2* 35.5*  MCV 89.3 87.1 86.6 86.5 86.6  PLT 278 273 306 319 327   No results for input(s): CKTOTAL, CKMB, CKMBINDEX, TROPONINI in the last 168 hours. No results for input(s): LABPROT, INR in the last 72 hours. No results for input(s): COLORURINE, LABSPEC, PHURINE, GLUCOSEU, HGBUR, BILIRUBINUR, KETONESUR, PROTEINUR, UROBILINOGEN, NITRITE, LEUKOCYTESUR in the last 72 hours.  Invalid input(s): APPERANCEUR     Component Value Date/Time   CHOL 242*  05/21/2014 1048   TRIG 181* 05/24/2014 0400   HDL 61 05/21/2014 1048   CHOLHDL 4.0 05/21/2014 1048   VLDL 49* 05/21/2014 1048   LDLCALC 132* 05/21/2014 1048   Lab Results  Component Value Date   HGBA1C 5.9* 05/21/2014      Component Value Date/Time   LABOPIA NONE DETECTED 05/20/2014 1513   COCAINSCRNUR NONE DETECTED 05/20/2014 1513   LABBENZ NONE DETECTED 05/20/2014 1513   AMPHETMU NONE DETECTED 05/20/2014 1513   THCU NONE DETECTED 05/20/2014 1513   LABBARB NONE DETECTED 05/20/2014 1513    No results for input(s): ETH in the last 168 hours.  I have personally reviewed the radiological images below and agree with the radiology interpretations.  Ct Head Wo Contrast 05/26/14 No significant change in volume of intraparenchymal hemorrhage. No new intracranial hemorrhage. Decreasing volume of intraventricular hemorrhage. No change in size of the cerebral ventricles. Recent nonhemorrhagic infarctions in the left occipital lobe and in the left globus pallidus/internal capsule genu.  05/21/2014   IMPRESSION: 1. Interval decompression of the left-sided cerebral hemorrhage, with subdural drain in place along the craniotomy site. The hematoma is significantly reduced in volume. There is gas in the center of the hematoma and a small amount of gas and blood products in the extra-axial space on the left. 2.  Intraventricular hemorrhage in both lateral ventricles and in the third and fourth ventricle, without hydrocephalus. 3. Faintly increased density along the right temporal lobe is compatible with a small amount of new subarachnoid hemorrhage along the right temporal lobe. 4. 3 mm of left-to-right midline shift.     05/20/2014   IMPRESSION: Large left frontal parietal intracerebral hemorrhage with a 7.5 mm of midline shift. The hemorrhage has extended into the left lateral ventricle.    Dg Chest Port 1 View  05/21/2014    IMPRESSION: NG tube placed.  Stable bibasilar atelectasis.  No evidence of  pulmonary edema.     05/20/2014   IMPRESSION: Endotracheal tube tip at the carina with the tip directed toward the right main bronchus. No edema or consolidation. No pneumothorax.    2D echo -  estimated ejection fraction was in the range of 65% to 70%  CUS - 1-39% right internal carotid artery stenosis and 40-59% left internal carotid artery stenosis. The left vertebral artery is patent with antegrade flow. Unable to visualize the right vertebral artery.   MRI brain and MRA head and neck 1. Stable appearance of residual left frontal and parietal hemorrhage following the craniotomy. 2. Areas of acute/subacute nonhemorrhagic infarct within the occipital lobes bilaterally, the posterior left temporal lobe, and the left globus pallidus. 3. 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 The configuration suggests this could be an on stable plaque. 4. Moderate to high-grade stenosis of the proximal right vertebral artery without a significant distal stenosis. 5. Mild atherosclerotic changes at the right carotid bifurcation and bilateral cavernous internal carotid arteries without other stenoses. 6. Subarachnoid hemorrhage over the right temporal lobe. 7. Intravascular enhancement in the posterior circulation territories suggests luxury perfusion or less likely meningitis. 8. Nonspecific enhancement surrounding the area of hemorrhage likely related to the recent surgery.   PHYSICAL EXAM  General - Well nourished, well developed elderly Caucasian lady, no acute distress.    Ophthalmologic - fundi not visualized due to incorporation.  Cardiovascular - Regular rate and rhythm with no murmur.  Neuro - Awake, alert, global aphasia, inconsistenly following command with eye opening/closure but not other commands. PERRL, pupil 3mm reactive to light. Left eyelid apraxia. Eyes has right gaze preference but can cross midline. Not blinking to visual  threat on the right side. Right facial droop. Spontaneously moving LUE and LLE, but 0/5 RUE on pain and trace withdraw on RLE on pain stimulation, no babinski bilaterally. Reflex 1+.    ASSESSMENT/PLAN Carolyn [name] is a 69 y.o. female with history of hypertension presenting with right arm weakness, severe headache, and subsequent unconsciousness.  She did not receive IV t-PA due to a large left frontal parietal intracerebral hemorrhage.  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  Repeat CT scans show new interval left parieto-occipital and BG infarcts raising concern for the initial hematoma being hemorrhagic transformation from embolic stroke. Stroke involves left parieto-occipital area, right PCA, MCA/PCA, and ACA regions, concerning for cardioembolic source. Left brain embolic stroke could also come from proximal left ICA stenosis with ulcerated plaque.  Resultant  Global aphasia and right hemiplegia  MRI as above concerning embolic stroke with hemorrhagic transformation  MRA neck showed 60% ICA stenosis with ulcerated plaque  Repeat CT showed smaller hematoma and decreased midline shift  Heparin subq for VTE prophylaxis DIET DYS 2 Room service appropriate?: No; Fluid consistency:: Nectar Thick  aspirin 81 mg orally every day and  clopidogrel 75 mg orally every day prior to admission, now on ASA 81mg . Continue antiplatelet on discharge.  EEG 05/24/14 No seizure activity noted  Continue keppra for now for seizure prophylaxis x 21 days as per NSG and CCM  For stroke etiology, pt is not anticoagulation candidate now, will hold off further embolic work up for now.  Therapy recommendations: CIR  Disposition: pending CIR admission  Carotid stenosis, left  CUS showed 40-59% stenosis  MRI showed 60% stenosis with ulcerative plaques  Difficult to determine whether it is symptomatic or now since pt also had bilateral infarcts more consistent with  cardioembolic stroke.  Need outpt follow up  Hypertension  Home meds: Toprol-XL  BP fluctuating   Home meds resumed.  Close monitoring  Hyperlipidemia  LDL 132, not at goal  Pt has allergy to statins  Hold off statin now  Other Stroke Risk Factors  Advanced age  Obesity, Body mass index is 35.86 kg/(m^2).   Other Active Problems  Hyperkalemia  hyperglycemia  Mild anemia  Other Pertinent History  History of abdominal aortic aneurysm repair  Hospital day # 12  Neurology will sign off. Please call with questions. [facility]. Thanks for the consult.   Marvel PlanJindong Camari Wisham, MD PhD Stroke Neurology 06/01/2014 3:14 PM     To contact Stroke Continuity provider, please refer to [email or web address]. After hours, contact General Neurology

## 2014-06-01 NOTE — Consult Note (Signed)
Physical Medicine and Rehabilitation Consult Reason for Consult: Nontraumatic ICH Referring Physician: Critical care   HPI: Carolyn [name] is a 69 y.o. right handed female with history of hypertension, recent status post AAA stent grafting at [facility] and was maintained on aspirin and Plavix.. 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 Admitted 05/20/2014 after after developing severe headache right arm weakness while at work and suddenly became unresponsive. Blood pressure 241/123. Cranial CT scan showed left ICH with 3 mm midline shift. Patient was intubated for airway protection. Underwent left frontoparietal craniotomy for evacuation of intracerebral hematoma 05/20/2014 per Dr. [name]. Nasogastric tube placed for nutritional support. Echocardiogram with ejection fraction of 70% grade 1 diastolic dysfunction. Carotid Dopplers with left 40-59% ICA stenosis. Maintained on Keppra for seizure prophylaxis. EEG showed no seizure activity. Cardiology consulted 05/23/2014 for ectopic atrial bradycardia identified per EKG no acute ST-T changes felt likely vagally mediated from CNS event and monitored with no further workup needed. Latest follow-up cranial CT scan MRI shows stable appearance of residual left frontal parietal hemorrhage following craniotomy there was areas of acute subacute nonhemorrhagic infarct within the occipital lobes bilaterally, posterior left temporal lobe, and left globus pallidus. MR angiogram of head and neck showed 60% stenosis of the proximal left ICA artery. Moderate to high-grade stenosis of the proximal right vertebral artery without significant distal stenosis. Small subarachnoid hemorrhage over the right temporal lobe. Neurology following recently placed on aspirin 81 mg daily 05/27/2014 as well as initiation of subcutaneous heparin for DVT prophylaxis. Patient was extubated 05/30/2014. Swallow study per  speech therapy maintained on a dysphagia #2 nectar thick liquid diet. Contact precautions for MRSA PCR screening. Physical therapy evaluation 05/31/2014 with recommendations of physical medicine rehabilitation consult.   Review of Systems  Unable to perform ROS: acuity of condition  All other systems reviewed and are negative.  Past Medical History  Diagnosis Date  . Hypertension   . AAA (abdominal aortic aneurysm)     a. s/p stent grafting @ [facility].  . Hemorrhagic cerebrovascular accident (CVA)     a. 05/20/2014 L hemispheric hemorrhagic CVA s/p decompressive craniotomy and evacuation.  . H/O echocardiogram     a. 04/2014 Echo: EF 65-70%, Gr 1 DD, mild LVH.   Past Surgical History  Procedure Laterality Date  . Abdominal aortic aneurysm repair    . Craniotomy Left 05/20/2014    Procedure: CRANIOTOMY HEMATOMA EVACUATION;  Surgeon: Aliene Beamsandy Kritzer, MD;  Location: [facility];  Service: Neurosurgery;  Laterality: Left;   History reviewed. No pertinent family history. Social History:  reports that she quit smoking about 9 years ago. She does not have any smokeless tobacco history on file. She reports that she drinks alcohol. She reports that she does not use illicit drugs. Allergies:  Allergies  Allergen Reactions  . Statins Other (See Comments)    reductis inhibitors  . Adhesive [Tape] Rash  . Latex Rash  . Other Rash    Medical tape and bandaids   Medications Prior to Admission  Medication Sig Dispense Refill  . aspirin EC 81 MG tablet Take 81 mg by mouth daily.    . clopidogrel (PLAVIX) 75 MG tablet Take 75 mg by mouth daily.    Marland Kitchen. levothyroxine (SYNTHROID, LEVOTHROID) 75 MCG tablet Take 75 mcg by mouth daily before breakfast.    . metoprolol succinate (TOPROL-XL) 25 MG 24 hr tablet Take 25 mg by mouth daily.    .Marland Kitchen  Multiple Vitamin (MULTIVITAMIN WITH MINERALS) TABS tablet Take 1 tablet by mouth daily.      Home: Home Living Family/patient expects to be discharged to::  Private residence Living Arrangements: Alone Available Help at Discharge: Family, Available 24 hours/day Type of Home: House Home Access: Stairs to enter [employer] of Steps: threshold  Home Layout: One level Home Equipment: Shower seat, Grab bars - tub/shower Additional Comments: Pt lived alone.  Her family is investigating options to provide 24 hour assist   Lives With: Alone  Functional History: Prior Function Level of Independence: Independent Comments: Pt was fully independent.  She worked full time as an Theatre manager Status:  Mobility: Bed Mobility Overal bed mobility: Needs Assistance Bed Mobility: Sit to Supine, Supine to Sit Supine to sit: Total assist, +2 for safety/equipment Sit to supine: Total assist, +2 for safety/equipment General bed mobility comments: pt too fatigued to assist coming to EOB Transfers Overall transfer level: Needs assistance General transfer comment: pt too lethargic to attempt stand this afternoon      ADL: ADL Overall ADL's : Needs assistance/impaired Eating/Feeding: Maximal assistance, Bed level Grooming: Wash/dry hands, Brushing hair, Total assistance, Bed level, Sitting Grooming Details (indicate cue type and reason): Pt took comb from therapist, but made no attempt to use it. Attempted Hand over hand assist, but pt resists activity  Upper Body Bathing: Total assistance, Sitting, Bed level Lower Body Bathing: Total assistance, Bed level Upper Body Dressing : Total assistance, Bed level Lower Body Dressing: Total assistance, Bed level Toilet Transfer: Total assistance Toilet Transfer Details (indicate cue type and reason): unable to safely perform with +1 assist  Toileting- Clothing Manipulation and Hygiene: Total assistance, Bed level Functional mobility during ADLs: Total assistance  Cognition: Cognition Overall Cognitive Status: Difficult to assess Arousal/Alertness: Awake/alert Orientation  Level: Other (comment) Attention: Sustained Sustained Attention: Impaired Sustained Attention Impairment: Verbal basic Memory:  (TBA) Awareness: Impaired Awareness Impairment: Intellectual impairment, Emergent impairment, Anticipatory impairment Problem Solving: Impaired Problem Solving Impairment: Verbal basic, Functional basic Safety/Judgment: Impaired Cognition Arousal/Alertness: Awake/alert, Lethargic Behavior During Therapy: Flat affect Overall Cognitive Status: Difficult to assess Difficult to assess due to: Level of arousal, Impaired communication  Blood pressure 162/66, pulse 63, temperature 98 F (36.7 C), temperature source Oral, resp. rate 15, height  (1.626 m), weight 94.8 kg (208 lb 15.9 oz), SpO2 97 %. Physical Exam  HENT:  Craniotomy site clean and dry  Eyes:  Pupils reactive to light without nystagmus  Neck: Normal range of motion. Neck supple. No thyromegaly present.  Cardiovascular:  Cardiac rate control  Respiratory:  Poor inspiratory effort but clear to auscultation  GI: Soft. Bowel sounds are normal. She exhibits no distension.  Neurological:  Patient does open her eyes to command. Lethargic which did limit exam. Follows commands 25% or less, expressive greater than receptive aphasia. 0/5 RUE and RLE. Does spontaneously move left but inconsistent. Appears to have large right field cut also.   Psychiatric:  flat    Results for orders placed or performed during the hospital encounter of 05/20/14 (from the past 24 hour(s))  Glucose, capillary     Status: None   Collection Time: 05/31/14  7:51 AM  Result Value Ref Range   Glucose-Capillary 99 70 - 99 mg/dL  Basic metabolic panel     Status: None   Collection Time: 06/01/14  4:51 AM  Result Value Ref Range   Sodium 142 135 - 145 mmol/L   Potassium 3.6 3.5 - 5.1 mmol/L  Chloride 104 101 - 111 mmol/L   CO2 25 22 - 32 mmol/L   Glucose, Bld 92 70 - 99 mg/dL   BUN 20 6 - 20 mg/dL   Creatinine, Ser  1.61 0.44 - 1.00 mg/dL   Calcium 9.1 8.9 - 09.6 mg/dL   GFR calc non Af Amer >60 >60 mL/min   GFR calc Af Amer >60 >60 mL/min   Anion gap 13 5 - 15   Dg Chest Port 1 View  05/31/2014   CLINICAL DATA:  Respiratory failure  EXAM: PORTABLE CHEST - 1 VIEW  COMPARISON:  05/29/2014  FINDINGS: Left upper extremity PICC, tip at the upper SVC.  Borderline cardiomegaly, accentuated by technique. Stable mild aortic tortuosity. Stable inflation after tracheal and esophageal extubation, with mild residual interstitial crowding at the left base. There is no suspected edema, consolidation, effusion, or pneumothorax.  IMPRESSION: Stable inflation post extubation.   Electronically Signed   By: Marnee Raiche M.D.   On: 05/31/2014 07:54   Dg Swallowing Func-speech Pathology  05/31/2014    Objective Swallowing Evaluation:    Patient Details  Name: Aamira Bischoff MRN: [id number] Date of Birth: 14-Dec-1945  Today's Date: 05/31/2014 Time: SLP Start Time (ACUTE ONLY): 1110-SLP Stop Time (ACUTE ONLY): 1130 SLP Time Calculation (min) (ACUTE ONLY): 20 min  Past Medical History:  Past Medical History  Diagnosis Date  . Hypertension   . AAA (abdominal aortic aneurysm)     a. s/p stent grafting @ [facility].  . Hemorrhagic cerebrovascular accident (CVA)     a. 05/20/2014 L hemispheric hemorrhagic CVA s/p decompressive craniotomy  and evacuation.  . H/O echocardiogram     a. 04/2014 Echo: EF 65-70%, Gr 1 DD, mild LVH.   Past Surgical History:  Past Surgical History  Procedure Laterality Date  . Abdominal aortic aneurysm repair    . Craniotomy Left 05/20/2014    Procedure: CRANIOTOMY HEMATOMA EVACUATION;  Surgeon: Aliene Beams, MD;   Location: [facility];  Service: Neurosurgery;  Laterality: Left;   HPI:  Other Pertinent Information: 69 yr old with PMH: HTN, AAA 2015, renal  difficulty admitted as a code stroke. CT large left frontal parietal  intracerebral hemorrhage with a 7.5 mm with extention into the left  lateral ventricle. Underwent  craniotomy 4/28. Intubated 4/28-5/8. CXR 5/6  Persistent low lung volumes with mild stable subsegmental atelectasis  and/or infiltrates and repeat CXR 5/7 the lungs are grossly clear.  No Data Recorded  Assessment / Plan / Recommendation CHL IP CLINICAL IMPRESSIONS 05/31/2014  Therapy Diagnosis Moderate oral phase dysphagia;Mild pharyngeal phase  dysphagia  Clinical Impression Pt presents with a primary oral phase dysphagia with  apraxia and right labial and buccal weakness and sensory deficits  resulting in severe anterior spillage and buccal residuals on the right.  Lingual manipulation for mastication of solids is impaired; pt transited  unmasticated cookie. Oropharyngeal phase is characterized by normal  strength and only trace flash penetration of thin liquids. Pt did not  aspirate during the study, thus laryngeal sensation is undetermined.  Recommend conservative diet of Dys 2 (ground) with nectar thick liquids to  allow for SLP observation during meal to ensure tolerance and upgrade to  thin if arousal is stable. Family in agreement.       CHL IP TREATMENT RECOMMENDATION 05/31/2014  Treatment Recommendations Therapy as outlined in treatment plan below     CHL IP DIET RECOMMENDATION 05/31/2014  SLP Diet Recommendations Dysphagia 2 (Fine chop);Nectar  Liquid Administration via (  None)  Medication Administration Crushed with puree  Compensations Slow rate;Small sips/bites;Check for pocketing;Check for  anterior loss  Postural Changes and/or Swallow Maneuvers (None)     CHL IP OTHER RECOMMENDATIONS 05/31/2014  Recommended Consults (None)  Oral Care Recommendations Oral care BID  Other Recommendations Order thickener from pharmacy;Have oral suction  available     CHL IP FOLLOW UP RECOMMENDATIONS 05/31/2014  Follow up Recommendations Inpatient Rehab     CHL IP FREQUENCY AND DURATION 05/31/2014  Speech Therapy Frequency (ACUTE ONLY) (None)  Treatment Duration 2 weeks     Pertinent Vitals/Pain NA    SLP Swallow Goals No flowsheet  data found.  No flowsheet data found.    CHL IP REASON FOR REFERRAL 05/31/2014  Reason for Referral Objectively evaluate swallowing function     CHL IP ORAL PHASE 05/31/2014  Lips (None)  Tongue (None)  Mucous membranes (None)  Nutritional status (None)  Other (None)  Oxygen therapy (None)  Oral Phase Impaired  Oral - Pudding Teaspoon (None)  Oral - Pudding Cup (None)  Oral - Honey Teaspoon (None)  Oral - Honey Cup (None)  Oral - Honey Syringe (None)  Oral - Nectar Teaspoon (None)  Oral - Nectar Cup (None)  Oral - Nectar Straw (None)  Oral - Nectar Syringe (None)  Oral - Ice Chips (None)  Oral - Thin Teaspoon (None)  Oral - Thin Cup (None)  Oral - Thin Straw (None)  Oral - Thin Syringe (None)  Oral - Puree (None)  Oral - Mechanical Soft (None)  Oral - Regular (None)  Oral - Multi-consistency (None)  Oral - Pill (None)  Oral Phase - Comment (None)      CHL IP PHARYNGEAL PHASE 05/31/2014  Pharyngeal Phase Impaired  Pharyngeal - Pudding Teaspoon (None)  Penetration/Aspiration details (pudding teaspoon) (None)  Pharyngeal - Pudding Cup (None)  Penetration/Aspiration details (pudding cup) (None)  Pharyngeal - Honey Teaspoon (None)  Penetration/Aspiration details (honey teaspoon) (None)  Pharyngeal - Honey Cup (None)  Penetration/Aspiration details (honey cup) (None)  Pharyngeal - Honey Syringe (None)  Penetration/Aspiration details (honey syringe) (None)  Pharyngeal - Nectar Teaspoon (None)  Penetration/Aspiration details (nectar teaspoon) (None)  Pharyngeal - Nectar Cup (None)  Penetration/Aspiration details (nectar cup) (None)  Pharyngeal - Nectar Straw (None)  Penetration/Aspiration details (nectar straw) (None)  Pharyngeal - Nectar Syringe (None)  Penetration/Aspiration details (nectar syringe) (None)  Pharyngeal - Ice Chips (None)  Penetration/Aspiration details (ice chips) (None)  Pharyngeal - Thin Teaspoon (None)  Penetration/Aspiration details (thin teaspoon) (None)  Pharyngeal - Thin Cup (None)  Penetration/Aspiration  details (thin cup) (None)  Pharyngeal - Thin Straw (None)  Penetration/Aspiration details (thin straw) (None)  Pharyngeal - Thin Syringe (None)  Penetration/Aspiration details (thin syringe') (None)  Pharyngeal - Puree (None)  Penetration/Aspiration details (puree) (None)  Pharyngeal - Mechanical Soft (None)  Penetration/Aspiration details (mechanical soft) (None)  Pharyngeal - Regular (None)  Penetration/Aspiration details (regular) (None)  Pharyngeal - Multi-consistency (None)  Penetration/Aspiration details (multi-consistency) (None)  Pharyngeal - Pill (None)  Penetration/Aspiration details (pill) (None)  Pharyngeal Comment (None)      No flowsheet data found.  No flowsheet data found.        Harlon DittyBonnie DeBlois, [place]MA CCC-SLP [phone]  Claudine MoutonDeBlois, Bonnie Caroline 05/31/2014, 12:19 PM     Assessment/Plan: Diagnosis: ICH with dense right HP 1. Does the need for close, 24 hr/day medical supervision in concert with the patient's rehab needs make it unreasonable for this patient to be served in a less intensive setting? Yes 2.  Co-Morbidities requiring supervision/potential complications: htn, 3. Due to bladder management, bowel management, safety, skin/wound care, disease management, medication administration, pain management and patient education, does the patient require 24 hr/day rehab nursing? Yes 4. Does the patient require coordinated care of a physician, rehab nurse, PT (1-2 hrs/day, 5 days/week), OT (1-2 hrs/day, 5 days/week) and SLP (1-2 hrs/day, 5 days/week) to address physical and functional deficits in the context of the above medical diagnosis(es)? Yes Addressing deficits in the following areas: balance, endurance, locomotion, strength, transferring, bowel/bladder control, bathing, dressing, feeding, grooming, toileting, cognition, speech, language, swallowing and psychosocial support 5. Can the patient actively participate in an intensive therapy program of at least 3 hrs of therapy per day at least 5 days  per week? Potentially 6. The potential for patient to make measurable gains while on inpatient rehab is good 7. Anticipated functional outcomes upon discharge from inpatient rehab are min assist and mod assist  with PT, min assist and mod assist with OT, supervision, min assist and mod assist with SLP. 8. Estimated rehab length of stay to reach the above functional goals is: 20-30 days 9. Does the patient have adequate social supports and living environment to accommodate these discharge functional goals? Yes 10. Anticipated D/C setting: Home 11. Anticipated post D/C treatments: HH therapy and Outpatient therapy 12. Overall Rehab/Functional Prognosis: excellent  RECOMMENDATIONS: This patient's condition is appropriate for continued rehabilitative care in the following setting: CIR Patient has agreed to participate in recommended program. Yes Note that insurance prior authorization may be required for reimbursement for recommended care.  Comment: Rehab Admissions Coordinator to follow up.  Thanks,  Ranelle Oyster, MD, [place]     06/01/2014

## 2014-06-02 DIAGNOSIS — E876 Hypokalemia: Secondary | ICD-10-CM

## 2014-06-02 DIAGNOSIS — R4701 Aphasia: Secondary | ICD-10-CM

## 2014-06-02 LAB — BASIC METABOLIC PANEL
ANION GAP: 11 (ref 5–15)
BUN: 16 mg/dL (ref 6–20)
CO2: 24 mmol/L (ref 22–32)
Calcium: 8.2 mg/dL — ABNORMAL LOW (ref 8.9–10.3)
Chloride: 107 mmol/L (ref 101–111)
Creatinine, Ser: 0.54 mg/dL (ref 0.44–1.00)
Glucose, Bld: 90 mg/dL (ref 70–99)
POTASSIUM: 3.1 mmol/L — AB (ref 3.5–5.1)
Sodium: 142 mmol/L (ref 135–145)

## 2014-06-02 LAB — MAGNESIUM: MAGNESIUM: 2 mg/dL (ref 1.7–2.4)

## 2014-06-02 LAB — CBC
HCT: 33.2 % — ABNORMAL LOW (ref 36.0–46.0)
Hemoglobin: 10.5 g/dL — ABNORMAL LOW (ref 12.0–15.0)
MCH: 27.2 pg (ref 26.0–34.0)
MCHC: 31.6 g/dL (ref 30.0–36.0)
MCV: 86 fL (ref 78.0–100.0)
PLATELETS: 306 10*3/uL (ref 150–400)
RBC: 3.86 MIL/uL — ABNORMAL LOW (ref 3.87–5.11)
RDW: 15.2 % (ref 11.5–15.5)
WBC: 14.3 10*3/uL — ABNORMAL HIGH (ref 4.0–10.5)

## 2014-06-02 LAB — PHOSPHORUS: PHOSPHORUS: 3.5 mg/dL (ref 2.5–4.6)

## 2014-06-02 MED ORDER — PANTOPRAZOLE SODIUM 40 MG PO TBEC
40.0000 mg | DELAYED_RELEASE_TABLET | Freq: Every day | ORAL | Status: DC
  Administered 2014-06-03: 40 mg via ORAL
  Filled 2014-06-02: qty 1

## 2014-06-02 MED ORDER — LEVETIRACETAM 500 MG PO TABS
500.0000 mg | ORAL_TABLET | Freq: Two times a day (BID) | ORAL | Status: DC
  Administered 2014-06-02 – 2014-06-03 (×2): 500 mg via ORAL
  Filled 2014-06-02 (×3): qty 1

## 2014-06-02 MED ORDER — POTASSIUM CHLORIDE 10 MEQ/50ML IV SOLN
10.0000 meq | INTRAVENOUS | Status: AC
  Administered 2014-06-02 (×4): 10 meq via INTRAVENOUS
  Filled 2014-06-02 (×4): qty 50

## 2014-06-02 NOTE — Progress Notes (Signed)
Inpatient Rehabilitation  I met with the patient and her daughter Carolyn [name] at the bedside to further discuss post acute rehab needs. Pt. Remains aphasic.   Carolyn [name] states that she and her brothers have talked and  agree that their preference is for pt. to come to IP Rehab initially with intent for SNF for remainder of her recovery.  I advised Carolyn [name] that we will seek insurance authorization for IP Rehab and will convey that SNF is expected to be needed post IP Rehab.  I informed Carolyn [name] that [place] (which is her primary coverage, not Medicare, as confirmed with [employer] in [place] center) may deny our IP Rehab request.  She is aware that while we will pursue IP Rehab, we could get a denial,  in which case pt. would likely need SNF for rehab.  Carolyn [name] states she is employed by [employer] and they have agreed to consider her mom for SNF there.  I have undated Henrietta Mayo, CM and [place], [place] will follow up tomorrow.  Please call if questions.  [place] Admissions Coordinator Cell [phone] Office [phone]

## 2014-06-02 NOTE — Progress Notes (Signed)
Physical Therapy Treatment Patient Details Name: Carolyn [name] MRN: [id number] DOB: 1945-04-17 Today's Date: 06/02/2014    History of Present Illness 69 yr old with PMH: HTN, AAA 2015, renal difficulty admitted as a code stroke. CT large left frontal parietal intracerebral hemorrhage with a 7.5 mm with extention into the left lateral ventricle. Underwent craniotomy 4/28. Intubated 4/28-5/8.  Repeat CT scans show new interal left parieto-occipital and basal ganglia infarcts raising concern for the initial hematoma being hemorrhagic transformation from embolic stroke.  Stroke involves left parieto-occipital area, Rt PCA, MCA/PCA, and ACA regions.    PT Comments    Pt admitted with above diagnosis. Pt currently with functional limitations due to balance and endurance deficits as well as weakness. Pt progressing well with incr ability to participate today.  Pt with delayed processing but can follow commands with incr time.  Great rehab candidate.  Pt will benefit from skilled PT to increase their independence and safety with mobility to allow discharge to the venue listed below.    Follow Up Recommendations  CIR     Equipment Recommendations  Other (comment) (TBA next venue)    Recommendations for Other Services Rehab consult     Precautions / Restrictions Precautions Precautions: Fall Restrictions Weight Bearing Restrictions: No    Mobility  Bed Mobility Overal bed mobility: Needs Assistance Bed Mobility: Rolling;Sidelying to Sit Rolling: Total assist;+2 for physical assistance (pt =10% reached for rail) Sidelying to sit: Total assist;+2 for physical assistance       General bed mobility comments: Pt needed total assist to come to EOB.  Pt could not push her trunk up without total assist (pt doing 10%).  Once up, total assist to scoot pt to eOB with pad as well as to asssit pt with upright sitting.    Transfers Overall transfer level: Needs assistance Equipment used:  None Transfers: Squat Pivot Transfers     Squat pivot transfers: Max assist;+2 physical assistance (pt =25% with pt weight bearing on bil LEs for transfer)     General transfer comment: Pt required assist for anterior lean as well as assist to stand.  Pt able to stand semi upright with max assist and use of pads.  Pivoted around with pt trying to move left LE however right LE kicked into extension during transfer.  Pt attempted to scoot back in chair but ultimately needed max assist to scoot pt back.    Ambulation/Gait                 Stairs            Wheelchair Mobility    Modified Rankin (Stroke Patients Only) Modified Rankin (Stroke Patients Only) Pre-Morbid Rankin Score: No symptoms Modified Rankin: Severe disability     Balance Overall balance assessment: Needs assistance;History of Falls Sitting-balance support: Bilateral upper extremity supported;Feet supported Sitting balance-Leahy Scale: Poor Sitting balance - Comments: pushing with L UE in sitting, sat x 15 minutes working on midline orientation, side to sit Postural control: Right lateral lean                          Cognition Arousal/Alertness: Awake/alert Behavior During Therapy: WFL for tasks assessed/performed Overall Cognitive Status: Impaired/Different from baseline Area of Impairment: Safety/judgement;Awareness;Problem solving;Attention;Following commands   Current Attention Level: Focused   Following Commands: Follows one step commands with increased time;Follows one step commands inconsistently (multimodal cues) Safety/Judgement: Decreased awareness of safety;Decreased awareness of deficits   Problem Solving:  Slow processing;Decreased initiation;Difficulty sequencing;Requires verbal cues;Requires tactile cues General Comments: Pt more alert today and able to participate.  Still with cognitive delays but improving.      Exercises General Exercises - Lower Extremity Long Arc  Quad: AROM;Left;5 reps;Seated Other Exercises Other Exercises: R LE ROM exercise    General Comments        Pertinent Vitals/Pain Pain Assessment: Faces Pain Score: 0-No pain Faces Pain Scale: No hurt  VSS    Home Living                      Prior Function            PT Goals (current goals can now be found in the care plan section) Acute Rehab PT Goals Patient Stated Goal: For pt to regain as much independence as possible (per daughter) Progress towards PT goals: Progressing toward goals    Frequency  Min 3X/week    PT Plan Current plan remains appropriate    Co-evaluation PT/OT/SLP Co-Evaluation/Treatment: Yes Reason for Co-Treatment: Complexity of the patient's impairments (multi-system involvement);For patient/therapist safety PT goals addressed during session: Mobility/safety with mobility OT goals addressed during session: ADL's and self-care     End of Session Equipment Utilized During Treatment: Gait belt Activity Tolerance: Patient tolerated treatment well;Patient limited by fatigue Patient left: in chair;with call bell/phone within reach;with family/visitor present     Time: [id number] PT Time Calculation (min) (ACUTE ONLY): 39 min  Charges:  $Therapeutic Activity: 23-37 mins                    G CodesTawni Millers:      Teegan Guinther F 06/02/2014, 10:25 AM Eber Jonesawn Yael Angerer,PT Acute Rehabilitation [phone])

## 2014-06-02 NOTE — Evaluation (Signed)
Occupational Therapy Evaluation Patient Details Name: [name] CourtGeraldine Orth MRN: [id number] DOB: 05/06/1945 Today's Date: 06/02/2014    History of Present Illness 69 yr old with PMH: HTN, AAA 2015, renal difficulty admitted as a code stroke. CT large left frontal parietal intracerebral hemorrhage with a 7.5 mm with extention into the left lateral ventricle. Underwent craniotomy 4/28. Intubated 4/28-5/8.  Repeat CT scans show new interal left parieto-occipital and basal ganglia infarcts raising concern for the initial hematoma being hemorrhagic transformation from embolic stroke.  Stroke involves left parieto-occipital area, Rt PCA, MCA/PCA, and ACA regions.   Clinical Impression   Pt with improved ability to self feed, able to wash her face with min assist and multimodal cues. Sat EOB while addressing midline orientation and righting posture x 15 minutes with good tolerance.  Pt requires multimodal cues to follow commands. Continues to require +2 assist for bed mobility and transfer to chair.  Educated daughter in positioning to protect R UE and encourage midline orientation of head.      Follow Up Recommendations  CIR;Supervision/Assistance - 24 hour    Equipment Recommendations       Recommendations for Other Services       Precautions / Restrictions Precautions Precautions: Fall Restrictions Weight Bearing Restrictions: No      Mobility Bed Mobility Overal bed mobility: Needs Assistance Bed Mobility: Rolling;Sidelying to Sit Rolling: Total assist;+2 for physical assistance (pt =10% reached for rail) Sidelying to sit: Total assist;+2 for physical assistance       General bed mobility comments: Pt needed total assist to come to EOB.  Pt could not push her trunk up without total assist (pt doing 10%).  Once up, total assist to scoot pt to eOB with pad as well as to asssit pt with upright sitting.    Transfers Overall transfer level: Needs assistance Equipment used:  None Transfers: Squat Pivot Transfers     Squat pivot transfers: Max assist;+2 physical assistance (pt =25% with pt weight bearing on bil LEs for transfer)     General transfer comment: Pt required assist for anterior lean as well as assist to stand.  Pt able to stand semi upright with max assist and use of pads.  Pivoted around with pt trying to move left LE however right LE kicked into extension during transfer.  Pt attempted to scoot back in chair but ultimately needed max assist to scoot pt back.      Balance Overall balance assessment: Needs assistance;History of Falls   Sitting balance-Leahy Scale: Poor Sitting balance - Comments: pushing with L UE in sitting, sat x 15 minutes working on midline orientation, side to sit Postural control: Right lateral lean                                  ADL Overall ADL's : Needs assistance/impaired Eating/Feeding: Minimal assistance;Sitting Eating/Feeding Details (indicate cue type and reason): pt demonstrated ability to drink with cup, per ST, pt self fed her magic cup with her L hand once assisted to initiate Grooming: Wash/dry face;Minimal assistance;Bed level Grooming Details (indicate cue type and reason): required tactile and verbal cues to initiate, but then completed with tactile cues to wash R side of face                               General ADL Comments: Instructed daughter in positioning R UE  on pillow in chair and placement of blankets to assist in decreasing lateral flexion of neck. Educated daughter to speak to pt from her R side and positioned recliner in room so daughter could sit to pt's right side.     Vision Additional Comments: requiring max verbal cues and assist to turn head to regard R hemispace   Perception     Praxis      Pertinent Vitals/Pain Pain Assessment: Faces Pain Score: 0-No pain Faces Pain Scale: No hurt     Hand Dominance     Extremity/Trunk Assessment              Communication     Cognition Arousal/Alertness: Awake/alert Behavior During Therapy: WFL for tasks assessed/performed Overall Cognitive Status: Impaired/Different from baseline Area of Impairment: Safety/judgement;Awareness;Problem solving;Attention;Following commands   Current Attention Level: Focused   Following Commands: Follows one step commands with increased time;Follows one step commands inconsistently (multimodal cues) Safety/Judgement: Decreased awareness of safety;Decreased awareness of deficits   Problem Solving: Slow processing;Decreased initiation;Difficulty sequencing;Requires verbal cues;Requires tactile cues General Comments: Pt more alert today and able to participate.  Still with cognitive delays but improving.     General Comments       Exercises       Shoulder Instructions      Home Living                                          Prior Functioning/Environment               OT Diagnosis:     OT Problem List:     OT Treatment/Interventions:      OT Goals(Current goals can be found in the care plan section) Acute Rehab OT Goals Patient Stated Goal: For pt to regain as much independence as possible (per daughter) Time For Goal Achievement: 06/14/14 Potential to Achieve Goals: Good  OT Frequency: Min 3X/week   Barriers to D/C:            Co-evaluation PT/OT/SLP Co-Evaluation/Treatment: Yes Reason for Co-Treatment: Complexity of the patient's impairments (multi-system involvement);For patient/therapist safety PT goals addressed during session: Mobility/safety with mobility OT goals addressed during session: ADL's and self-care      End of Session Equipment Utilized During Treatment: Gait belt Nurse Communication: Need for lift equipment  Activity Tolerance: Patient tolerated treatment well Patient left: in chair;with call bell/phone within reach;with family/visitor present   Time: [id number] OT Time Calculation (min):  39 min Charges:  OT General Charges $OT Visit: 1 Procedure OT Treatments $Neuromuscular Re-education: 8-22 mins G-Codes:    Evern BioMayberry, Dustin Burrill Lynn 06/02/2014, 10:22 AM  [phone]

## 2014-06-02 NOTE — Progress Notes (Addendum)
PULMONARY  / CRITICAL CARE MEDICINE  Name: [name] CourtGeraldine Rumble MRN: [id number] DOB: Mar 30, 1945    LOS: 13  REFERRING MD :  Dr. Roseanne RenoStewart  CHIEF COMPLAINT:  ICH  BRIEF PATIENT DESCRIPTION:  [age] F with PMH of htn and [facility] to [facility] ED 4/28 with L hemispheric hemorrhagic CVA. Intubated in ED due to depressed LOC [facility]. 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    MAJOR EVENTS/TEST RESULTS: [facility] Stroke team consultation. NS consultation 4/28 CT head: Large left frontal parietal intracerebral hemorrhage with a 7.5 mm of midline shift. The hemorrhage has extended into the left lateral ventricle 4/28 Procedure: Left frontoparietal craniotomy for evacuation of intracerebral hematoma. Surgeon: [name] 4/29 CT head: Interval decompression of the left-sided cerebral hemorrhage, with subdural drain in place along the craniotomy site. The hematoma is significantly reduced in volume. There is gas in the center of the hematoma and a small amount of gas and blood products in the extra-axial space on the left. Intraventricular hemorrhage in both lateral ventricles and in the third and fourth ventricle, without hydrocephalus. Faintly increased density along the right temporal lobe is compatible with a small amount of new subarachnoid hemorrhage along the right temporal lobe. 3 mm of left-to-right midline shift 4/29 Remains intubated, sedated, on nicardipine, propofol 4/29 TTE: nml LV fn 4/30 CT head: "improved" 5/1 head CT (brady) >> no change 05/25/14: Afebrile. A-line BP reading 20 points higher than cuff 05/28/14: Opens eyes and squeezes hand but not wiggle toes on left.  5/8  Extubated  SUBJECTIVE: Remains extubated  VITAL SIGNS: Temp:  [97.7 F (36.5 C)-98.9 F (37.2 C)] 98.4 F (36.9 C) (05/11 0700) Pulse Rate:  [60-87] 72 (05/11 1100) Resp:  [14-19] 17  (05/11 1100) BP: (113-166)/(33-72) 113/51 mmHg (05/11 1100) SpO2:  [92 %-100 %] 98 % (05/11 1100) Weight:  [86.3 kg (190 lb 4.1 oz)] 86.3 kg (190 lb 4.1 oz) (05/11 0449) VENTILATOR SETTINGS:     INTAKE / OUTPUT: Intake/Output      05/10 0701 - 05/11 0700 05/11 0701 - 05/12 0700   P.O. 600    I.V. (mL/kg) 830 (9.6)    IV Piggyback     Total Intake(mL/kg) 1430 (16.6)    Urine (mL/kg/hr)     Total Output       Net +1430          Urine Occurrence 6 x 1 x   Stool Occurrence  1 x    PHYSICAL EXAMINATION: General: no distress Neuro: moves left side, follows some commands HEENT: wound site clean Cardiovascular:  s1 s2 regular Lungs: reduced bases Abdomen:  Obese, soft, +BS Ext: warm, no edema  LABS:  PULMONARY  Recent Labs Lab 05/28/14 0444 05/29/14 0320  PHART 7.437 7.425  PCO2ART 44.4 45.0  PO2ART 152* 121*  HCO3 29.4* 29.0*  TCO2 30.7 30.3  O2SAT 98.7 97.9   CBC  Recent Labs Lab 05/30/14 0520 05/31/14 0500 06/02/14 0441  HGB 10.9* 11.2* 10.5*  HCT 35.2* 35.5* 33.2*  WBC 12.7* 12.8* 14.3*  PLT 319 327 306    CHEMISTRY  Recent Labs Lab 05/27/14 0600 05/28/14 0425 05/29/14 0510 05/30/14 0520 05/31/14 0500 06/01/14 0451 06/02/14 0441  NA 143 140 137 140 140 142 142  K 3.8 3.6 3.8 3.9 3.7 3.6 3.1*  CL 104 101 100* 100* 103 104 107  CO2 31 29 30  30  28 25 24   GLUCOSE 129* 119* 118* 121* 98 92 90  BUN 19 18 20  21* 18 20 16   CREATININE 0.56 0.50 0.57 0.50 0.62 0.59 0.54  CALCIUM 9.0 8.9 9.1 9.3 9.4 9.1 8.2*  MG 2.1 2.1 2.1 2.2  --   --  2.0  PHOS 3.7 3.8 4.0 4.0  --   --  3.5   Estimated Creatinine Clearance: 71.5 mL/min (by C-G formula based on Cr of 0.54).   ENDOCRINE CBG (last 3)   Recent Labs  05/30/14 2342 05/31/14 0412 05/31/14 0751  GLUCAP 90 99 99   IMAGING x48h Dg Chest Port 1 View  06/01/2014   CLINICAL DATA:  Atelectasis.  EXAM: PORTABLE CHEST - 1 VIEW  COMPARISON:  05/31/2014 .  FINDINGS: Left PICC line in stable position.  Heart size stable with mild cardiomegaly. Pulmonary vascularity normal. Lungs are clear of infiltrates. No pleural effusion or pneumothorax.  IMPRESSION: 1. PICC line in stable position. 2. No acute cardiopulmonary disease.   Electronically Signed   By: Maisie Fushomas  Register   On: 06/01/2014 07:35   I reviewed CXR myself, PICC line in good position with no acute disease noted.  ASSESSMENT / PLAN:  PULMONARY A: Acute respiratory failure in setting of CVA. P: IS as able pCXR for atx showed resolution, but continue IS. Repeat CXR in AM now that patient eating to insure no aspiration and resolution of atelectasis.  CARDIOVASCULAR A: HTN emergency. Hx of AAA s/p repair. Hypotension, junctional bradycardia >> improved. BP rising, history of junctional brady. P:  Tele Low dose clonidine hydralazine ordered.  RENAL A: New hypokalemia. P: No role hypernatremia this far out Chem in am  Replace K again today but will give IV. Mg and Phos are within range.  GASTROINTESTINAL A: Nutrition off Dysphagia passed modified study P:   Swallow assessment SLP- dysphagia 2 ordered  Protonix for SUP  HEMATOLOGIC A: Anemia of critical illness. P: SQ heparin  INFECTIOUS A: No evidence for infection. P: Monitor temp, WBC count  ENDOCRINE A: Hx of hypothyroidism.  Hyperglycemia. P: Levothyroxine SSI, may dc with TF off  NEUROLOGIC A: Lt frontal parietal ICH 2nd to HTN emergency >> s/p craniotomy 4/28.   P: RASS goal: 0 Keppra per neurology, consider 21 days then dc PT consulted OT  Transferred from ICU to SDU,  Will likely transfer to CIR once approved by insurance.  Discussed with bedside RN and CCM NP.  Alyson ReedyWesam G. Magali Bray, M.D. [facility]. Pager: [phone]. After hours pager: [phone].

## 2014-06-02 NOTE — PMR Pre-admission (Signed)
PMR Admission Coordinator Pre-Admission Assessment  Patient: Carolyn [name] is an 69 y.o., female MRN: [id number] DOB: 17-Apr-1945 Height: 5\' 4"  (162.6 cm) Weight: 90.4 kg (199 lb 4.7 oz)              Insurance Information HMO:     PPO:      PCP:      IPA:      80/20:      OTHER: EPO Advantage PRIMARY: [place]      Policy#: [id number]      Subscriber: self Received authorization for nine days from 06-03-14 through 06-11-14 with updates due to [place] on 06-09-14 CM Name: Dominga FerryJoanne, RN      Phone#: [phone], option 1     Fax#: [phone] Pre-Cert#: [id number]      Employer: [employer] (working full time) Benefits:  Phone #: [phone]     Name: Michaele OfferMiranda Eff. Date: 01-22-14     Deduct: none      Out of Pocket Max: [phone]      Life Max: none CIR: $500/per admit, pre-auth needed      SNF: $500/per admit, 100 days visit limit Outpatient: 100%     Co-Pay: $25 copay, 100 visit limits Home Health: 100%      Co-Pay: none, unlimited visits DME: 100%     Co-Pay: none, visit limit based on medical necessity Providers: in network  SECONDARY: Medicare A & B      Policy#: [id number]      Subscriber: self Pre-Cert#: verified in [employer]      Employer: [employer]  Eff. Date: A: 07-23-10, B: 11-22-13     Deduct: $1288      Out of Pocket Max: none       Life Max: unlimited CIR: 100%      SNF: 100% days 1-20; 80% days 21-100 (100 days max) Outpatient: 80%     Co-Pay: 20% Home Health: 100%      Co-Pay: none, no visit limits DME: 80%     Co-Pay: 20%   Emergency Contact Information Contact Information    Name Relation Home Work Mobile   Hemric,Alicia Daughter [phone]  [phone]   Arcola JanskySpring,Bob Son   [phone]   Earleen NewportSpring,Mike Son   [phone]     Current Medical History  Patient Admitting Diagnosis: ICH with dense right HP  History of Present Illness: Carolyn [name] is a 69 y.o. right handed female with history of hypertension, recent status post AAA stent grafting [facility] and was maintained on aspirin and Plavix.. 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 Admitted 05/20/2014 after after developing severe headache right arm weakness while at work and suddenly became unresponsive. Blood pressure 241/123. Cranial CT scan showed left ICH with 3 mm midline shift. Patient was intubated for airway protection. Underwent left frontoparietal craniotomy for evacuation of intracerebral hematoma 05/20/2014 per Dr. [name]. Nasogastric tube placed for nutritional support. Echocardiogram with ejection fraction of 70% grade 1 diastolic dysfunction. Carotid Dopplers with left 40-59% ICA stenosis. Maintained on Keppra for seizure prophylaxis. EEG showed no seizure activity. Cardiology consulted 05/23/2014 for ectopic atrial bradycardia identified per EKG no acute ST-T changes felt likely vagally mediated from CNS event and monitored with no further workup needed. Latest follow-up cranial CT scan MRI shows stable appearance of residual left frontal parietal hemorrhage following craniotomy there was areas of acute subacute nonhemorrhagic infarct within the occipital lobes bilaterally, posterior left temporal lobe, and left globus  pallidus. MR angiogram of head and neck showed 60% stenosis of the proximal left ICA artery. Moderate to high-grade stenosis of the proximal right vertebral artery without significant distal stenosis. Small subarachnoid hemorrhage over the right temporal lobe. Neurology following recently placed on aspirin 81 mg daily 05/27/2014 as well as initiation of subcutaneous heparin for DVT prophylaxis. Patient was extubated 05/30/2014. Swallow study per speech therapy maintained on a dysphagia #2 nectar thick liquid diet. Contact precautions for MRSA PCR screening. Physical therapy evaluation 05/31/2014 with recommendations of physical medicine rehabilitation consult.  NIH Total: 19  Past Medical History  Past  Medical History  Diagnosis Date  . Hypertension   . AAA (abdominal aortic aneurysm)     a. s/p stent grafting @ [facility].  . Hemorrhagic cerebrovascular accident (CVA)     a. 05/20/2014 L hemispheric hemorrhagic CVA s/p decompressive craniotomy and evacuation.  . H/O echocardiogram     a. 04/2014 Echo: EF 65-70%, Gr 1 DD, mild LVH.    Family History  family history is not on file.  Prior Rehab/Hospitalizations: none   Current Medications   Current facility-administered medications:  .  0.9 %  sodium chloride infusion, , Intravenous, Continuous, Coralyn Helling, MD, Last Rate: 50 mL/hr at 06/03/14 0828 .  acetaminophen (TYLENOL) tablet 650 mg, 650 mg, Per Tube, Q4H PRN **OR** acetaminophen (TYLENOL) suppository 650 mg, 650 mg, Rectal, Q4H PRN, Merwyn Katos, MD .  aspirin chewable tablet 81 mg, 81 mg, Oral, Daily, Micki Riley, MD, 81 mg at 06/03/14 1111 .  fentaNYL (SUBLIMAZE) injection 25 mcg, 25 mcg, Intravenous, Q2H PRN, Coralyn Helling, MD .  heparin injection 5,000 Units, 5,000 Units, Subcutaneous, 3 times per day, Coletta Memos, MD, 5,000 Units at 06/03/14 0535 .  levETIRAcetam (KEPPRA) tablet 500 mg, 500 mg, Oral, BID, Darl Householder Masters, RPH, 500 mg at 06/03/14 1110 .  levothyroxine (SYNTHROID, LEVOTHROID) tablet 75 mcg, 75 mcg, Oral, QAC breakfast, Darl Householder Masters, RPH, 75 mcg at 06/03/14 0827 .  metoprolol succinate (TOPROL-XL) 24 hr tablet 25 mg, 25 mg, Oral, Daily, Marvel Plan, MD, 25 mg at 06/03/14 1111 .  multivitamin with minerals tablet 1 tablet, 1 tablet, Oral, Daily, Marvel Plan, MD, 1 tablet at 06/03/14 1111 .  ondansetron (ZOFRAN) injection 4 mg, 4 mg, Intravenous, Q6H PRN, Genelle Gather, MD, 4 mg at 05/20/14 1714 .  pantoprazole (PROTONIX) EC tablet 40 mg, 40 mg, Oral, Daily, Darl Householder Masters, RPH, 40 mg at 06/03/14 1111 .  sodium chloride 0.9 % injection 10-40 mL, 10-40 mL, Intracatheter, Q12H, Merwyn Katos, MD, 20 mL at 06/03/14 1112 .  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 She is home in the afternoons to get her granddtr off the school bus. She loves to spend time with her grand-children. She also enjoys reading (newspapers, ads and Time magazine). She also loves to do scratch off lottery tickets.   Home Assistive Devices / Equipment Home Assistive Devices/Equipment: None Home Equipment: Shower seat, Grab bars - tub/shower  Prior Functional Level Prior Function Level of Independence: Independent Comments: Pt was fully independent.  She worked full time as an Environmental health practitioner  Current Functional Level Cognition  Arousal/Alertness: Awake/alert Overall Cognitive Status: Impaired/Different from baseline Difficult to assess due to: Impaired communication Current Attention Level: Focused Orientation Level: Other (comment) (UTA) Following Commands: Follows one step commands with increased time, Follows one step commands inconsistently (multimodal cues) Safety/Judgement: Decreased awareness of safety, Decreased awareness of deficits General Comments: Pt more alert today and able to participate.  Still with cognitive delays but improving.   Attention: Sustained Sustained Attention: Impaired Sustained Attention Impairment: Verbal basic Memory:  (TBA) Awareness: Impaired Awareness Impairment: Intellectual impairment, Emergent impairment, Anticipatory impairment Problem Solving: Impaired Problem Solving Impairment: Verbal basic, Functional basic Safety/Judgment: Impaired    Extremity Assessment (includes Sensation/Coordination)  Upper  Extremity Assessment: RUE deficits/detail RUE Deficits / Details: Appears flaccid.   Does not attempt to move or use it.  PROM WFL  RUE Coordination: decreased fine motor, decreased gross motor  Lower Extremity Assessment: RLE deficits/detail, LLE deficits/detail RLE Deficits / Details: noted spontaneous flexion of leg, nothing to command    ADLs  Overall ADL's : Needs assistance/impaired Eating/Feeding: Minimal assistance, Sitting Eating/Feeding Details (indicate cue type and reason): pt demonstrated ability to drink with cup, per ST, pt self fed her magic cup with her L hand once assisted to initiate Grooming: Wash/dry face, Minimal assistance, Bed level Grooming Details (indicate cue type and reason): required tactile and verbal cues to initiate, but then completed with tactile cues to wash R side of face Upper Body Bathing: Total assistance, Sitting, Bed level Lower Body Bathing: Total assistance, Bed level Upper Body Dressing : Total assistance, Bed level Lower Body Dressing: Total assistance, Bed level Toilet Transfer: Total assistance Toilet Transfer Details (indicate cue type and reason): unable to safely perform with +1 assist  Toileting- Clothing Manipulation and Hygiene: Total assistance, Bed level Functional mobility during ADLs: Total assistance General ADL Comments: Instructed daughter in positioning R UE on pillow in chair and placement of blankets to assist in decreasing lateral flexion of neck. Educated daughter to speak to pt from her R side and positioned recliner in room so daughter could sit to pt's right side.    Mobility  Overal bed mobility: Needs Assistance Bed Mobility: Rolling, Sidelying to Sit Rolling: Total assist, +2 for physical assistance (pt =10% reached for rail) Sidelying to sit: Total assist, +2 for physical assistance Supine to sit: Total assist, +2 for safety/equipment Sit to supine: Total assist, +2 for safety/equipment General bed mobility  comments: Pt needed total assist to come to EOB.  Pt could not push her trunk up without total assist (pt doing 10%).  Once up, total assist to scoot pt to eOB with pad as well as to asssit pt with upright sitting.      Transfers  Overall transfer level: Needs assistance Equipment used: None Transfers: Squat Pivot Transfers Squat pivot transfers: Max assist, +2 physical assistance (pt =25% with pt weight bearing on bil LEs for transfer) General transfer comment: Pt required assist for anterior lean as well as assist to stand.  Pt able to stand semi upright with max assist and use of pads.  Pivoted around with pt trying to move left LE however right LE kicked into extension during transfer.  Pt attempted to scoot back in chair but ultimately needed max assist to scoot pt back.      Ambulation / Gait / Stairs / Wheelchair Mobility   not assessed, anticipate needs    Posture / Balance Dynamic Sitting Balance Sitting balance - Comments: pushing with L UE in  sitting, sat x 15 minutes working on midline orientation, side to sit Balance Overall balance assessment: Needs assistance, History of Falls Sitting-balance support: Bilateral upper extremity supported, Feet supported Sitting balance-Leahy Scale: Poor Sitting balance - Comments: pushing with L UE in sitting, sat x 15 minutes working on midline orientation, side to sit Postural control: Right lateral lean    Special needs/care consideration BiPAP/CPAP no CPM no  Continuous Drip IV no  Dialysis no         Life Vest no  Oxygen no  Special Bed no  Trach Size no  Wound Vac (area) no       Skin - staples along L parietal area                               Bowel mgmt: last BM on 06-02-14 Bladder mgmt: + urinary incontinence. Pt did use bedpan for first time last night. Per dtr, pt indicates her need to use the restroom by shaking her L LE and getting restless. Diabetic mgmt no   Previous Home Environment Living Arrangements: Alone  Lives  With: Alone Available Help at Discharge: Family (all children work) Type of Home: House Home Layout: One level Home Access: Stairs to enter Secretary/administratorntrance Stairs-Number of Steps: threshold  Bathroom Shower/Tub: Tub/shower unit, Health visitorWalk-in shower Bathroom Toilet: Standard Home Care Services: No Additional Comments: Pt lived alone.  Her family is investigating options to provide 24 hour assist and are agreeable to pursuing SNF after CIR.  Discharge Living Setting Plans for Discharge Living Setting: Other (Comment) (plan is for SNF as children cannot provide care) Type of Home at Discharge: Skilled Nursing Facility Care Facility Name at Discharge: to be determined Does the patient have any problems obtaining your medications?: Yes (Describe)  Social/Family/Support Systems Patient Roles: Other (Comment) (worked full time as Corporate treasureradmin. assistant for [employer]) Contact Information: dtr Helmut Musterlicia Hemrick is primary contact Anticipated Caregiver: the plan is for SNF after CIR as all children work. Anticipated Caregiver's Contact Information: see above Ability/Limitations of Caregiver: all three children work (dtr works at [employer] and states they may consider her for SNF) Caregiver Availability: Other (Comment) (all three children work and would not be avail for support) Discharge Plan Discussed with Primary Caregiver: Yes Is Caregiver In Agreement with Plan?: Yes Does Caregiver/Family have Issues with Lodging/Transportation while Pt is in Rehab?: No  Goals/Additional Needs Patient/Family Goal for Rehab: Minimal to Moderate assist with PT/OT; Supervision to minimal to moderate assistance with SLP Expected length of stay: 20-30 days Cultural Considerations: none Equipment Needs: to be determined Pt/Family Agrees to Admission and willing to participate: Yes (family is aware of the plan for SNF after CIR) Program Orientation Provided & Reviewed with Pt/Caregiver Including Roles  & Responsibilities:  Yes   Decrease burden of Care through IP rehab admission: This was discussed between Dr. Riley KillSwartz and Darl PikesSusan, admission coordinator. Per that discussion, it is possible to consider this admit as decreasing burden of care. However, Dr. Rosalyn ChartersSwartz's initial rehab goals indicate that pt may make more progress and possibly get to a minimal to moderate level. This determination will be clarified as pt participates in the program. Diet advancement, Decrease number of caregivers, Bowel and bladder program and Patient/family education.  Possible need for SNF placement upon discharge: yes, this is likely as all adult children work. Pt's daughter, Helmut Musterlicia states she is employed by [employer] and they have agreed to consider her mom  for SNF there. It was explained that possible SNF placement would be dependent on insurance approval and if SNF is in network with [place]BS of [place].  Patient Condition: This patient's medical and functional status has changed since the consult dated: 06-01-14 in which the Rehabilitation Physician determined and documented that the patient's condition is appropriate for intensive rehabilitative care in an inpatient rehabilitation facility. See "History of Present Illness" (above) for medical update. Functional changes are: total assistance with all functional mobility. Patient's medical and functional status update has been discussed with the Rehabilitation physician and patient remains appropriate for inpatient rehabilitation. Will admit to inpatient rehab today.  Preadmission Screen Completed By: Juliann Mule, PT, 06/03/2014 12:58 PM ______________________________________________________________________   Discussed status with Dr. Wynn Banker on 06-03-14 at 1258 and received telephone approval for admission today.  Admission Coordinator:  Juliann Mule, PT, time1258/Date 06-03-14

## 2014-06-02 NOTE — Progress Notes (Signed)
eLink Physician-Brief Progress Note Patient Name: Carolyn [name] DOB: [birth year] MRN: [id number]   Date of Service  06/02/2014  HPI/Events of Note  Family witnessed erratic movement of LLE. Patient was reportedly awake during this event, did not lose control of bowel or bladder, did not bite tongue and did not experience a post ictal stage. At worst this represents a focal seizure. Hard to establish the exact significance. Patient now awake and able to move LLE weakly.  eICU Interventions  Continue Keppra.      Intervention Category Major Interventions: Seizures - evaluation and management  Lenell AntuSommer,Ameya Vowell Eugene 06/02/2014, 9:38 PM

## 2014-06-02 NOTE — Progress Notes (Signed)
06/02/2014 Monitor tech called patient heart rate went to 39 at 0815 for 8 seconds. Dr Molli KnockYacoub was called at 1230 and  made aware. [facility] HospitalNadine Mounir Skipper RN.

## 2014-06-02 NOTE — Progress Notes (Signed)
Speech Language Pathology Treatment: Dysphagia;Cognitive-Linquistic  Patient Details Name: Carolyn [name] MRN: [id number] DOB: 1945/08/05 Today's Date: 06/02/2014 Time: [id number] SLP Time Calculation (min) (ACUTE ONLY): 25 min  Assessment / Plan / Recommendation Clinical Impression  Treatment session focused on therapeutic interventions for functional communication, problem solving, and safe PO intake during am meal. Pt responded well to max multimodal cueing to follow one step commands with feeding, fading to min verbal/tactile cues. Pt eventually able to initiate self feeding, problem solving with basic tasks at end of session. Pt tolerated thin liquids well, able to use straw with moderate tactile cues fading to independence at end of session, with no signs of aspiration. Moderate assist needed to remove pocketed eggs from right buccal cavity. Will upgrade from dys 2 (fine chop) thin liquids. Encourage self feeding with assist.    HPI Other Pertinent Information: 69 yr old with PMH: HTN, AAA 2015, renal difficulty admitted as a code stroke. CT large left frontal parietal intracerebral hemorrhage with a 7.5 mm with extention into the left lateral ventricle. Underwent craniotomy 4/28. Intubated 4/28-5/8. CXR 5/6 Persistent low lung volumes with mild stable subsegmental atelectasis and/or infiltrates and repeat CXR 5/7 the lungs are grossly clear.   Pertinent Vitals Pain Assessment: Faces Faces Pain Scale: No hurt  SLP Plan  Continue with current plan of care    Recommendations Diet recommendations: Dysphagia 2 (fine chop);Thin liquid Liquids provided via: Cup;Straw Medication Administration: Crushed with puree Supervision: Staff to assist with self feeding Compensations: Slow rate;Small sips/bites;Check for pocketing;Check for anterior loss              General recommendations: Rehab consult Oral Care Recommendations: Oral care BID Follow up Recommendations: Inpatient Rehab Plan:  Continue with current plan of care    GO    [facility]-CahBonnie Kahiau Schewe, MA CCC-SLP [phone]  Carolyn MoutonDeBlois, Carlisa Eble Carolyn [name] 06/02/2014, 9:46 AM

## 2014-06-03 ENCOUNTER — Inpatient Hospital Stay (HOSPITAL_COMMUNITY): Payer: BLUE CROSS/BLUE SHIELD

## 2014-06-03 ENCOUNTER — Inpatient Hospital Stay (HOSPITAL_COMMUNITY)
Admission: RE | Admit: 2014-06-03 | Discharge: 2014-07-14 | DRG: 057 | Disposition: A | Discharge status: Skilled Nursing Facility | Payer: BLUE CROSS/BLUE SHIELD | Source: Intra-hospital | Attending: Physical Medicine & Rehabilitation | Admitting: Physical Medicine & Rehabilitation

## 2014-06-03 DIAGNOSIS — E039 Hypothyroidism, unspecified: Secondary | ICD-10-CM | POA: Diagnosis not present

## 2014-06-03 DIAGNOSIS — I6340 Cerebral infarction due to embolism of unspecified cerebral artery: Secondary | ICD-10-CM

## 2014-06-03 DIAGNOSIS — G8191 Hemiplegia, unspecified affecting right dominant side: Secondary | ICD-10-CM

## 2014-06-03 DIAGNOSIS — Z22322 Carrier or suspected carrier of Methicillin resistant Staphylococcus aureus: Secondary | ICD-10-CM

## 2014-06-03 DIAGNOSIS — N390 Urinary tract infection, site not specified: Secondary | ICD-10-CM | POA: Diagnosis not present

## 2014-06-03 DIAGNOSIS — B9689 Other specified bacterial agents as the cause of diseases classified elsewhere: Secondary | ICD-10-CM

## 2014-06-03 DIAGNOSIS — I610 Nontraumatic intracerebral hemorrhage in hemisphere, subcortical: Secondary | ICD-10-CM | POA: Diagnosis not present

## 2014-06-03 DIAGNOSIS — G8190 Hemiplegia, unspecified affecting unspecified side: Secondary | ICD-10-CM | POA: Diagnosis not present

## 2014-06-03 DIAGNOSIS — I69151 Hemiplegia and hemiparesis following nontraumatic intracerebral hemorrhage affecting right dominant side: Principal | ICD-10-CM

## 2014-06-03 DIAGNOSIS — R4701 Aphasia: Secondary | ICD-10-CM | POA: Diagnosis present

## 2014-06-03 DIAGNOSIS — R1310 Dysphagia, unspecified: Secondary | ICD-10-CM | POA: Diagnosis not present

## 2014-06-03 DIAGNOSIS — Z7902 Long term (current) use of antithrombotics/antiplatelets: Secondary | ICD-10-CM

## 2014-06-03 DIAGNOSIS — I612 Nontraumatic intracerebral hemorrhage in hemisphere, unspecified: Secondary | ICD-10-CM | POA: Diagnosis present

## 2014-06-03 DIAGNOSIS — I69120 Aphasia following nontraumatic intracerebral hemorrhage: Secondary | ICD-10-CM | POA: Diagnosis not present

## 2014-06-03 DIAGNOSIS — B952 Enterococcus as the cause of diseases classified elsewhere: Secondary | ICD-10-CM | POA: Diagnosis not present

## 2014-06-03 DIAGNOSIS — Z7982 Long term (current) use of aspirin: Secondary | ICD-10-CM | POA: Diagnosis not present

## 2014-06-03 DIAGNOSIS — Z79899 Other long term (current) drug therapy: Secondary | ICD-10-CM | POA: Diagnosis not present

## 2014-06-03 DIAGNOSIS — I69191 Dysphagia following nontraumatic intracerebral hemorrhage: Secondary | ICD-10-CM | POA: Diagnosis not present

## 2014-06-03 DIAGNOSIS — I10 Essential (primary) hypertension: Secondary | ICD-10-CM | POA: Diagnosis not present

## 2014-06-03 DIAGNOSIS — Z87891 Personal history of nicotine dependence: Secondary | ICD-10-CM

## 2014-06-03 DIAGNOSIS — R1314 Dysphagia, pharyngoesophageal phase: Secondary | ICD-10-CM | POA: Diagnosis not present

## 2014-06-03 DIAGNOSIS — I619 Nontraumatic intracerebral hemorrhage, unspecified: Secondary | ICD-10-CM

## 2014-06-03 DIAGNOSIS — A499 Bacterial infection, unspecified: Secondary | ICD-10-CM | POA: Diagnosis not present

## 2014-06-03 LAB — BASIC METABOLIC PANEL
Anion gap: 8 (ref 5–15)
BUN: 13 mg/dL (ref 6–20)
CO2: 27 mmol/L (ref 22–32)
Calcium: 9.1 mg/dL (ref 8.9–10.3)
Chloride: 104 mmol/L (ref 101–111)
Creatinine, Ser: 0.6 mg/dL (ref 0.44–1.00)
GFR calc Af Amer: >60 mL/min (ref >60)
Glucose, Bld: 96 mg/dL (ref 65–99)
POTASSIUM: 3.7 mmol/L (ref 3.5–5.1)
SODIUM: 139 mmol/L (ref 135–145)

## 2014-06-03 LAB — CBC
HEMATOCRIT: 36.3 % (ref 36.0–46.0)
HEMOGLOBIN: 11.2 g/dL — AB (ref 12.0–15.0)
MCH: 26.4 pg (ref 26.0–34.0)
MCHC: 30.9 g/dL (ref 30.0–36.0)
MCV: 85.6 fL (ref 78.0–100.0)
Platelets: 334 10*3/uL (ref 150–400)
RBC: 4.24 MIL/uL (ref 3.87–5.11)
RDW: 15.3 % (ref 11.5–15.5)
WBC: 13.7 10*3/uL — ABNORMAL HIGH (ref 4.0–10.5)

## 2014-06-03 LAB — MAGNESIUM: MAGNESIUM: 2 mg/dL (ref 1.7–2.4)

## 2014-06-03 LAB — PHOSPHORUS: Phosphorus: 3.9 mg/dL (ref 2.5–4.6)

## 2014-06-03 MED ORDER — ADULT MULTIVITAMIN W/MINERALS CH
1.0000 | ORAL_TABLET | Freq: Every day | ORAL | Status: DC
  Administered 2014-06-04 – 2014-07-14 (×41): 1 via ORAL
  Filled 2014-06-03 (×44): qty 1

## 2014-06-03 MED ORDER — SORBITOL 70 % SOLN
30.0000 mL | Freq: Every day | Status: DC | PRN
  Administered 2014-06-15 – 2014-07-01 (×4): 30 mL via ORAL
  Filled 2014-06-03 (×5): qty 30

## 2014-06-03 MED ORDER — LEVETIRACETAM 500 MG PO TABS
500.0000 mg | ORAL_TABLET | Freq: Two times a day (BID) | ORAL | Status: DC
  Filled 2014-06-03 (×2): qty 1

## 2014-06-03 MED ORDER — METOPROLOL SUCCINATE ER 25 MG PO TB24
25.0000 mg | ORAL_TABLET | Freq: Every day | ORAL | Status: DC
  Administered 2014-06-04: 25 mg via ORAL
  Filled 2014-06-03 (×3): qty 1

## 2014-06-03 MED ORDER — HEPARIN SODIUM (PORCINE) 5000 UNIT/ML IJ SOLN: 5000.0000 [IU] | Freq: Three times a day (TID) | INTRAMUSCULAR | Status: DC

## 2014-06-03 MED ORDER — ACETAMINOPHEN 325 MG PO TABS
650.0000 mg | ORAL_TABLET | ORAL | Status: DC | PRN
  Administered 2014-06-04 – 2014-06-08 (×5): 650 mg
  Filled 2014-06-03 (×5): qty 2

## 2014-06-03 MED ORDER — ONDANSETRON HCL 4 MG/2ML IJ SOLN: 4.0000 mg | Freq: Four times a day (QID) | INTRAMUSCULAR | Status: DC | PRN

## 2014-06-03 MED ORDER — LEVOTHYROXINE SODIUM 75 MCG PO TABS
75.0000 ug | ORAL_TABLET | Freq: Every day | ORAL | Status: DC
  Administered 2014-06-04 – 2014-07-14 (×41): 75 ug via ORAL
  Filled 2014-06-03 (×42): qty 1

## 2014-06-03 MED ORDER — HEPARIN SODIUM (PORCINE) 5000 UNIT/ML IJ SOLN
5000.0000 [IU] | Freq: Three times a day (TID) | INTRAMUSCULAR | Status: DC
  Administered 2014-06-03 – 2014-07-14 (×121): 5000 [IU] via SUBCUTANEOUS
  Filled 2014-06-03 (×127): qty 1

## 2014-06-03 MED ORDER — ACETAMINOPHEN 650 MG RE SUPP: 650.0000 mg | RECTAL | Status: DC | PRN

## 2014-06-03 MED ORDER — LEVETIRACETAM 100 MG/ML PO SOLN
500.0000 mg | Freq: Two times a day (BID) | ORAL | Status: DC
  Administered 2014-06-03 – 2014-07-14 (×82): 500 mg via ORAL
  Filled 2014-06-03 (×85): qty 5

## 2014-06-03 MED ORDER — PANTOPRAZOLE SODIUM 40 MG PO TBEC
40.0000 mg | DELAYED_RELEASE_TABLET | Freq: Every day | ORAL | Status: DC
  Administered 2014-06-04: 40 mg via ORAL
  Filled 2014-06-03: qty 1

## 2014-06-03 MED ORDER — ONDANSETRON HCL 4 MG PO TABS
4.0000 mg | ORAL_TABLET | Freq: Four times a day (QID) | ORAL | Status: DC | PRN
  Filled 2014-06-03: qty 1

## 2014-06-03 MED ORDER — CETYLPYRIDINIUM CHLORIDE 0.05 % MT LIQD
7.0000 mL | Freq: Two times a day (BID) | OROMUCOSAL | Status: DC
  Administered 2014-06-03 – 2014-07-14 (×75): 7 mL via OROMUCOSAL

## 2014-06-03 MED ORDER — ASPIRIN 81 MG PO CHEW
81.0000 mg | CHEWABLE_TABLET | Freq: Every day | ORAL | Status: DC
  Administered 2014-06-04 – 2014-07-13 (×40): 81 mg via ORAL
  Filled 2014-06-03 (×39): qty 1

## 2014-06-03 NOTE — Progress Notes (Signed)
Rehab admissions - I am following pt's case today. [place]. I have reviewed pt's case and noted that she had erratic movement of L LE last night. Per epic note, "at worst this represents a focal seizure."  I have called and spoken with Dr. Molli KnockYacoub to seek his input on this and to determine when pt is medically ready for inpatient rehab. He will get back to me after he assesses pt.  I will keep the pt/family and medical team aware of any updates as I wait to hear from Dr. Molli KnockYacoub.  Thanks.  Juliann MuleJanine Sosha Shepherd, PT Rehabilitation Admissions Coordinator [phone]

## 2014-06-03 NOTE — Discharge Summary (Signed)
Physician Discharge Summary  Patient ID: Carolyn [name] MRN: [id number] DOB/AGE: [birth year] 69 y.o.  Admit date: 05/20/2014 Discharge date: 06/03/2014    Discharge Diagnoses:  Active Problems:   ICH (intracerebral hemorrhage)   Hypertensive emergency   Cerebral brain hemorrhage   Respiratory failure   Junctional bradycardia   Arterial hypotension   Bradycardia   Acute respiratory failure   Acute respiratory failure, unspecified whether with hypoxia or hypercapnia   Embolic cerebral infarction   Endotracheal tube present   Stroke   Embolic stroke   HLD (hyperlipidemia)   Aphasia   Right hemiplegia    Brief Summary: Carolyn [name] is a 69 y.o. y/o female with a PMH of HTN, recent stent of AAA performed at [facility], maintained on ASA and plavix.   Presented 4/28 as code stroke with acute onset headache, R arm weakness.  Became unresponsive, BP 241/123 on admission and was found to have acute ICH. Intubated for airway protection, started on cardene gtt and PCCM admitted. She underwent left frontoparietal craniotomy for evacuation of intracerebral hematoma 04/28 per Dr. Hal Neer. She was followed closely by neurology and neurosurgery, maintained on keppra for seizure prophylaxis.  EEG negative for seizure activity and most recent imaging shows stable appearance of residual L frontal parietal hemorrhage s/p craniotomy.  She was started on SQ heparin for DVT prophylaxis with neurology approval 5/5.  Course was c/b bradycardia, followed by cardiology and felt to be r/t vasovagal with no further w/u needed.  She weaned slowly from vent and was extubated successfully 5/8.  She remains aphasic, passed swallow eval and is tolerating PO diet and working well with PT.  She is medically cleared and stable for d/c to inpatient Rehab.    MAJOR EVENTS/TEST RESULTS: 4/28 admitted as above to [facility] service. Stroke team consultation. NS consultation 4/28 CT head: Large left frontal parietal  intracerebral hemorrhage with a 7.5 mm of midline shift. The hemorrhage has extended into the left lateral ventricle 4/28 Procedure: Left frontoparietal craniotomy for evacuation of intracerebral hematoma. Surgeon: Hal Neer 4/29 CT head: Interval decompression of the left-sided cerebral hemorrhage, with subdural drain in place along the craniotomy site. The hematoma is significantly reduced in volume. There is gas in the center of the hematoma and a small amount of gas and blood products in the extra-axial space on the left. Intraventricular hemorrhage in both lateral ventricles and in the third and fourth ventricle, without hydrocephalus. Faintly increased density along the right temporal lobe is compatible with a small amount of new subarachnoid hemorrhage along the right temporal lobe. 3 mm of left-to-right midline shift 4/29 Remains intubated, sedated, on nicardipine, propofol 4/29 TTE: nml LV fn 4/30 CT head: "improved" 5/1 head CT (brady) >> no change 05/25/14: Afebrile. A-line BP reading 20 points higher than cuff 05/28/14: Opens eyes and squeezes hand but not wiggle toes on left.  5/8 Extubated                                                                    D/c plan by Discharge Diagnosis  Lt frontal parietal ICH 2nd to HTN emergency >> s/p craniotomy 4/28.  PLAN -  Keppra per neurology, consider 21 days then dc PT/OT  Acute respiratory failure - r/t ICH  Plan -  pulm hygiene  Intermittent f/u CXR to ensure no aspiration/ f/u atx   HTN emergency. Hx of AAA s/p repair. Hypotension, junctional bradycardia >> improved. PLAN -  Continue metoprolol  Monitor BP and HR with hx bradycardia   Dysphagia - s/p stroke  PLAN -  Ongoing ST   Filed Vitals:   06/03/14 0000 06/03/14 0400 06/03/14 0406 06/03/14 0805  BP: 168/64 106/86  126/78  Pulse: 65 83  77  Temp: 98.7 F (37.1 C) 98.2 F (36.8 C)  98.5 F (36.9 C)  TempSrc: Oral Oral  Oral  Resp: 15 19  13   Height:       Weight:   199 lb 4.7 oz (90.4 kg)   SpO2: 98% 95%  96%     Discharge Labs  BMET  Recent Labs Lab 05/28/14 0425 05/29/14 0510 05/30/14 0520 05/31/14 0500 06/01/14 0451 06/02/14 0441 06/03/14 0414  NA 140 137 140 140 142 142 139  K 3.6 3.8 3.9 3.7 3.6 3.1* 3.7  CL 101 100* 100* 103 104 107 104  CO2 29 30 30 28 25 24 27   GLUCOSE 119* 118* 121* 98 92 90 96  BUN 18 20 21* 18 20 16 13   CREATININE 0.50 0.57 0.50 0.62 0.59 0.54 0.60  CALCIUM 8.9 9.1 9.3 9.4 9.1 8.2* 9.1  MG 2.1 2.1 2.2  --   --  2.0 2.0  PHOS 3.8 4.0 4.0  --   --  3.5 3.9     CBC   Recent Labs Lab 05/31/14 0500 06/02/14 0441 06/03/14 0414  HGB 11.2* 10.5* 11.2*  HCT 35.5* 33.2* 36.3  WBC 12.8* 14.3* 13.7*  PLT 327 306 334   Anti-Coagulation No results for input(s): INR in the last 168 hours.    Discharge Instructions    Ambulatory referral to Neurology    Complete by:  As directed   Pt will follow up with Dr. Erlinda Hong at [facility] in about 2 months. Thanks.            Follow-up Information    Follow up with Xu,Jindong, MD. Schedule an appointment as soon as possible for a visit in 2 months.   Specialty:  Neurology   Why:  stroke clinic   Contact information:   [address] [place] [address] [phone]        Scheduled Meds: . aspirin  81 mg Oral Daily  . heparin subcutaneous  5,000 Units Subcutaneous 3 times per day  . levETIRAcetam  500 mg Oral BID  . levothyroxine  75 mcg Oral QAC breakfast  . metoprolol succinate  25 mg Oral Daily  . multivitamin with minerals  1 tablet Oral Daily  . pantoprazole  40 mg Oral Daily  . sodium chloride  10-40 mL Intracatheter Q12H   Continuous Infusions: . sodium chloride 50 mL/hr at 06/03/14 0828   PRN Meds:.acetaminophen **OR** acetaminophen, fentaNYL (SUBLIMAZE) injection, ondansetron (ZOFRAN) IV, sodium chloride   Disposition:  CIR   Discharged Condition: Carolyn [name] has met maximum benefit of inpatient care and is  medically stable and cleared for discharge.  Patient is pending follow up as above.      Time spent on disposition:  Greater than 35 minutes.   SignedDarlina Sicilian, NP 06/03/2014  11:04 AM Pager: [phone] or [phone]  *Care during the described time interval was provided by me and/or other providers on the critical care team. I have reviewed this patient's available data, including medical history, events of note,  physical examination and test results as part of my evaluation.

## 2014-06-03 NOTE — Progress Notes (Signed)
Rehab admissions - I spoke with Dr. Nelda Marseille about pt's status and he gave medical clearance for pt to be admitted to CIR today. We have insurance approval and a bed available and will admit her to rehab later today. MD and dtr shared that pt shakes her L LE when she needs to use the restroom and that's likely what happened last night per MD.  I called and updated Debbie, case Freight forwarder and Eliezer Lofts, social worker is also aware. I also spoke with pt's RN.  I met with pt and dtr to share these updates. Pt was aphasic but did nod and smile at times. Pt's dtr was very supportive and I will complete admission paperwork with pt's dtr soon.  Please call me with any questions. Thanks.  Nanetta Batty, PT Rehabilitation Admissions Coordinator [phone]

## 2014-06-03 NOTE — H&P (Signed)
Physical Medicine and Rehabilitation Admission H&P   Chief Complaint  Patient presents with  . Code Stroke  : HPI: Carolyn [name] is a 69 y.o. right handed female with history of hypertension, recent status post AAA stent grafting at [facility] and was maintained on aspirin and Plavix.. By report patient lived alone prior to admission working as an Web designer. Admitted 05/20/2014 after after developing severe headache right arm weakness while at work and suddenly became unresponsive. Blood pressure 241/123. Cranial CT scan showed left ICH with 3 mm midline shift. Patient was intubated for airway protection. Underwent left frontoparietal craniotomy for evacuation of intracerebral hematoma 05/20/2014 per Dr. Hal Neer. Nasogastric tube placed for nutritional support. Echocardiogram with ejection fraction of 99% grade 1 diastolic dysfunction. Carotid Dopplers with left 40-59% ICA stenosis. Maintained on Keppra for seizure prophylaxis. EEG showed no seizure activity. Cardiology consulted 05/23/2014 for ectopic atrial bradycardia identified per EKG no acute ST-T changes felt likely vagally mediated from CNS event and monitored with no further workup needed. Latest follow-up cranial CT scan MRI shows stable appearance of residual left frontal parietal hemorrhage following craniotomy there was areas of acute subacute nonhemorrhagic infarct within the occipital lobes bilaterally, posterior left temporal lobe, and left globus pallidus. MR angiogram of head and neck showed 60% stenosis of the proximal left ICA artery. Moderate to high-grade stenosis of the proximal right vertebral artery without significant distal stenosis. Small subarachnoid hemorrhage over the right temporal lobe. Neurology following recently placed on aspirin 81 mg daily 05/27/2014 as well as initiation of subcutaneous heparin for DVT prophylaxis. Patient was extubated 05/30/2014. Swallow study per speech  therapy maintained on a dysphagia #2 nectar thick liquid diet. Contact precautions for MRSA PCR screening. Physical therapy evaluation 05/31/2014 with recommendations of physical medicine rehabilitation consult  ROS Review of Systems  Unable to perform ROS: acuity of condition  All other systems reviewed and are negative Past Medical History  Diagnosis Date  . Hypertension   . AAA (abdominal aortic aneurysm)     a. s/p stent grafting @ [facility].  . Hemorrhagic cerebrovascular accident (CVA)     a. 05/20/2014 L hemispheric hemorrhagic CVA s/p decompressive craniotomy and evacuation.  . H/O echocardiogram     a. 04/2014 Echo: EF 65-70%, Gr 1 DD, mild LVH.   Past Surgical History  Procedure Laterality Date  . Abdominal aortic aneurysm repair    . Craniotomy Left 05/20/2014    Procedure: CRANIOTOMY HEMATOMA EVACUATION; Surgeon: Karie Chimera, MD; Location: [facility]; Service: Neurosurgery; Laterality: Left;   History reviewed. No pertinent family history. Social History:  reports that she quit smoking about 9 years ago. She does not have any smokeless tobacco history on file. She reports that she drinks alcohol. She reports that she does not use illicit drugs. Allergies:  Allergies  Allergen Reactions  . Statins Other (See Comments)    reductis inhibitors  . Adhesive [Tape] Rash  . Latex Rash  . Other Rash    Medical tape and bandaids   Medications Prior to Admission  Medication Sig Dispense Refill  . aspirin EC 81 MG tablet Take 81 mg by mouth daily.    . clopidogrel (PLAVIX) 75 MG tablet Take 75 mg by mouth daily.    Marland Kitchen levothyroxine (SYNTHROID, LEVOTHROID) 75 MCG tablet Take 75 mcg by mouth daily before breakfast.    . metoprolol succinate (TOPROL-XL) 25 MG 24 hr tablet Take 25 mg by mouth daily.    . Multiple Vitamin (MULTIVITAMIN  WITH MINERALS) TABS tablet Take 1 tablet by mouth  daily.      Home: Home Living Family/patient expects to be discharged to:: Private residence Living Arrangements: Alone Available Help at Discharge: Family, Available 24 hours/day Type of Home: House Home Access: Stairs to enter [employer] of Steps: threshold  Home Layout: One level Home Equipment: Shower seat, Grab bars - tub/shower Additional Comments: Pt lived alone. Her family is investigating options to provide 24 hour assist  Lives With: Alone  Functional History: Prior Function Level of Independence: Independent Comments: Pt was fully independent. She worked full time as an Aeronautical engineer Status:  Mobility: Bed Mobility Overal bed mobility: Needs Assistance Bed Mobility: Rolling, Sidelying to Sit Rolling: Total assist, +2 for physical assistance (pt =10% reached for rail) Sidelying to sit: Total assist, +2 for physical assistance Supine to sit: Total assist, +2 for safety/equipment Sit to supine: Total assist, +2 for safety/equipment General bed mobility comments: Pt needed total assist to come to EOB. Pt could not push her trunk up without total assist (pt doing 10%). Once up, total assist to scoot pt to eOB with pad as well as to asssit pt with upright sitting.  Transfers Overall transfer level: Needs assistance Equipment used: None Transfers: Squat Pivot Transfers Squat pivot transfers: Max assist, +2 physical assistance (pt =25% with pt weight bearing on bil LEs for transfer) General transfer comment: Pt required assist for anterior lean as well as assist to stand. Pt able to stand semi upright with max assist and use of pads. Pivoted around with pt trying to move left LE however right LE kicked into extension during transfer. Pt attempted to scoot back in chair but ultimately needed max assist to scoot pt back.       ADL: ADL Overall ADL's : Needs assistance/impaired Eating/Feeding: Minimal assistance,  Sitting Eating/Feeding Details (indicate cue type and reason): pt demonstrated ability to drink with cup, per ST, pt self fed her magic cup with her L hand once assisted to initiate Grooming: Wash/dry face, Minimal assistance, Bed level Grooming Details (indicate cue type and reason): required tactile and verbal cues to initiate, but then completed with tactile cues to wash R side of face Upper Body Bathing: Total assistance, Sitting, Bed level Lower Body Bathing: Total assistance, Bed level Upper Body Dressing : Total assistance, Bed level Lower Body Dressing: Total assistance, Bed level Toilet Transfer: Total assistance Toilet Transfer Details (indicate cue type and reason): unable to safely perform with +1 assist  Toileting- Clothing Manipulation and Hygiene: Total assistance, Bed level Functional mobility during ADLs: Total assistance General ADL Comments: Instructed daughter in positioning R UE on pillow in chair and placement of blankets to assist in decreasing lateral flexion of neck. Educated daughter to speak to pt from her R side and positioned recliner in room so daughter could sit to pt's right side.  Cognition: Cognition Overall Cognitive Status: Impaired/Different from baseline Arousal/Alertness: Awake/alert Orientation Level: Other (comment) (UTA) Attention: Sustained Sustained Attention: Impaired Sustained Attention Impairment: Verbal basic Memory: (TBA) Awareness: Impaired Awareness Impairment: Intellectual impairment, Emergent impairment, Anticipatory impairment Problem Solving: Impaired Problem Solving Impairment: Verbal basic, Functional basic Safety/Judgment: Impaired Cognition Arousal/Alertness: Awake/alert Behavior During Therapy: WFL for tasks assessed/performed Overall Cognitive Status: Impaired/Different from baseline Area of Impairment: Safety/judgement, Awareness, Problem solving, Attention, Following commands Current Attention Level: Focused Following  Commands: Follows one step commands with increased time, Follows one step commands inconsistently (multimodal cues) Safety/Judgement: Decreased awareness of safety, Decreased awareness of  deficits Problem Solving: Slow processing, Decreased initiation, Difficulty sequencing, Requires verbal cues, Requires tactile cues General Comments: Pt more alert today and able to participate. Still with cognitive delays but improving.  Difficult to assess due to: Impaired communication  Physical Exam: Blood pressure 168/64, pulse 65, temperature 98.7 F (37.1 C), temperature source Oral, resp. rate 15, height 5' 4"  (1.626 m), weight 90.4 kg (199 lb 4.7 oz), SpO2 98 %. Physical Exam HENT:  Craniotomy site clean and dry  Eyes:  Pupils reactive to light without nystagmus  Neck: Normal range of motion. Neck supple. No thyromegaly present.  Cardiovascular:  Cardiac rate control  Respiratory:  Poor inspiratory effort but clear to auscultation  GI: Soft. Bowel sounds are normal. She exhibits no distension.  Neurological:  Patient does open her eyes to command. Lethargic which did limit exam. Follows commands 25% or less, expressive greater than receptive aphasia. 0/5 RUE and RLE. Does spontaneously move left but inconsistent. Appears to have large right field cut also.  Pinch on RUE and RLE elicits withdrawl Psychiatric:  flat     Lab Results Last 48 Hours    Results for orders placed or performed during the hospital encounter of 05/20/14 (from the past 48 hour(s))  Basic metabolic panel Status: None   Collection Time: 06/01/14 4:51 AM  Result Value Ref Range   Sodium 142 135 - 145 mmol/L   Potassium 3.6 3.5 - 5.1 mmol/L   Chloride 104 101 - 111 mmol/L   CO2 25 22 - 32 mmol/L   Glucose, Bld 92 70 - 99 mg/dL   BUN 20 6 - 20 mg/dL   Creatinine, Ser 0.59 0.44 - 1.00 mg/dL   Calcium 9.1 8.9 - 10.3 mg/dL   GFR calc non Af Amer >60 >60 mL/min    GFR calc Af Amer >60 >60 mL/min    Comment: (NOTE) The eGFR has been calculated using the CKD EPI equation. This calculation has not been validated in all clinical situations. eGFR's persistently <60 mL/min signify possible Chronic Kidney Disease.    Anion gap 13 5 - 15  CBC Status: Abnormal   Collection Time: 06/02/14 4:41 AM  Result Value Ref Range   WBC 14.3 (H) 4.0 - 10.5 K/uL   RBC 3.86 (L) 3.87 - 5.11 MIL/uL   Hemoglobin 10.5 (L) 12.0 - 15.0 g/dL   HCT 33.2 (L) 36.0 - 46.0 %   MCV 86.0 78.0 - 100.0 fL   MCH 27.2 26.0 - 34.0 pg   MCHC 31.6 30.0 - 36.0 g/dL   RDW 15.2 11.5 - 15.5 %   Platelets 306 150 - 400 K/uL  Basic metabolic panel Status: Abnormal   Collection Time: 06/02/14 4:41 AM  Result Value Ref Range   Sodium 142 135 - 145 mmol/L   Potassium 3.1 (L) 3.5 - 5.1 mmol/L   Chloride 107 101 - 111 mmol/L   CO2 24 22 - 32 mmol/L   Glucose, Bld 90 70 - 99 mg/dL   BUN 16 6 - 20 mg/dL   Creatinine, Ser 0.54 0.44 - 1.00 mg/dL   Calcium 8.2 (L) 8.9 - 10.3 mg/dL   GFR calc non Af Amer >60 >60 mL/min   GFR calc Af Amer >60 >60 mL/min    Comment: (NOTE) The eGFR has been calculated using the CKD EPI equation. This calculation has not been validated in all clinical situations. eGFR's persistently <60 mL/min signify possible Chronic Kidney Disease.    Anion gap 11 5 - 15  Magnesium Status: None   Collection Time: 06/02/14 4:41 AM  Result Value Ref Range   Magnesium 2.0 1.7 - 2.4 mg/dL  Phosphorus Status: None   Collection Time: 06/02/14 4:41 AM  Result Value Ref Range   Phosphorus 3.5 2.5 - 4.6 mg/dL      Imaging Results (Last 48 hours)    Dg Chest Port 1 View  06/01/2014 CLINICAL DATA: Atelectasis. EXAM: PORTABLE CHEST - 1 VIEW COMPARISON: 05/31/2014 . FINDINGS: Left PICC line in stable position. Heart size  stable with mild cardiomegaly. Pulmonary vascularity normal. Lungs are clear of infiltrates. No pleural effusion or pneumothorax. IMPRESSION: 1. PICC line in stable position. 2. No acute cardiopulmonary disease. Electronically Signed By: Marcello Moores Register On: 06/01/2014 07:35        Medical Problem List and Plan: 1. 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 DVT Prophylaxis/Anticoagulation: Subcutaneous heparin initiated 05/24/2014. Monitor for any bleeding episodes 3. Pain Management: Tylenol as needed 4. Seizure prophylaxis. Keppra 500 mg twice a day. EEG negative 5. Neuropsych: This patient is not capable of making decisions on her own behalf. 6. Skin/Wound Care: Routine skin checks 7. Fluids/Electrolytes/Nutrition: Strict I knows of follow-up chemistries 8. Dysphagia. Dysphagia #2 nectar liquids. Monitor hydration follow-up speech therapy 9. MRSA PCR screening positive. Contact precautions 10. Hypertension. Toprol-XL 25 mg daily. Monitor with increased mobility 11. Hypothyroidism. Synthroid  Post Admission Physician Evaluation: 1. Functional deficits secondary to Left frontoparietal IPH with RIght HP, aphasia and dysphagia. 2. Patient is admitted to receive collaborative, interdisciplinary care between the physiatrist, rehab nursing staff, and therapy team. 3. Patient's level of medical complexity and substantial therapy needs in context of that medical necessity cannot be provided at a lesser intensity of care such as a SNF. 4. Patient has experienced substantial functional loss from his/her baseline which was documented above under the "Functional History" and "Functional Status" headings. Judging by the patient's diagnosis, physical exam, and functional history, the patient has potential for functional progress which will result in measurable gains while on inpatient rehab. These gains will be of substantial and practical  use upon discharge in facilitating mobility and self-care at the household level. 5. Physiatrist will provide 24 hour management of medical needs as well as oversight of the therapy plan/treatment and provide guidance as appropriate regarding the interaction of the two. 6. 24 hour rehab nursing will assist with bladder management, bowel management, safety, skin/wound care, disease management, medication administration, pain management and patient education and help integrate therapy concepts, techniques,education, etc. 7. PT will assess and treat for/with: ADLs, Cognitive perceptual skills, Neuromuscular re education, safety, endurance, equipment. Goals are: Min A. 8. OT will assess and treat for/with: ADLs, Cognitive perceptual skills, Neuromuscular re education, safety, endurance, equipment. Goals are: Min A. Therapy may proceed with showering this patient. 9. SLP will assess and treat for/with: Swallowing and aphasia. Goals are: express basic needs. 10. Case Management and Social Worker will assess and treat for psychological issues and discharge planning. 11. Team conference will be held weekly to assess progress toward goals and to determine barriers to discharge. 12. Patient will receive at least 3 hours of therapy per day at least 5 days per week. 13. ELOS: 22-28d  14. Prognosis: fair     Charlett Blake M.D. [facility] FAAPM&R (Sports Med, Neuromuscular Med) Diplomate Am Board of Electrodiagnostic Med  06/03/2014

## 2014-06-04 ENCOUNTER — Inpatient Hospital Stay (HOSPITAL_COMMUNITY): Payer: BLUE CROSS/BLUE SHIELD | Admitting: Speech Pathology

## 2014-06-04 ENCOUNTER — Inpatient Hospital Stay (HOSPITAL_COMMUNITY): Payer: BLUE CROSS/BLUE SHIELD | Admitting: Occupational Therapy

## 2014-06-04 ENCOUNTER — Inpatient Hospital Stay (HOSPITAL_COMMUNITY): Payer: BLUE CROSS/BLUE SHIELD | Admitting: Physical Therapy

## 2014-06-04 DIAGNOSIS — G8190 Hemiplegia, unspecified affecting unspecified side: Secondary | ICD-10-CM

## 2014-06-04 DIAGNOSIS — R4701 Aphasia: Secondary | ICD-10-CM

## 2014-06-04 DIAGNOSIS — I610 Nontraumatic intracerebral hemorrhage in hemisphere, subcortical: Secondary | ICD-10-CM

## 2014-06-04 DIAGNOSIS — I619 Nontraumatic intracerebral hemorrhage, unspecified: Secondary | ICD-10-CM

## 2014-06-04 LAB — COMPREHENSIVE METABOLIC PANEL
ALBUMIN: 3.1 g/dL — AB (ref 3.5–5.0)
ALK PHOS: 99 U/L (ref 38–126)
ALT: 23 U/L (ref 14–54)
AST: 25 U/L (ref 15–41)
Anion gap: 8 (ref 5–15)
BUN: 12 mg/dL (ref 6–20)
CHLORIDE: 102 mmol/L (ref 101–111)
CO2: 28 mmol/L (ref 22–32)
CREATININE: 0.62 mg/dL (ref 0.44–1.00)
Calcium: 9.2 mg/dL (ref 8.9–10.3)
GFR calc non Af Amer: >60 mL/min (ref >60)
Glucose, Bld: 96 mg/dL (ref 65–99)
Potassium: 3.6 mmol/L (ref 3.5–5.1)
Sodium: 138 mmol/L (ref 135–145)
TOTAL PROTEIN: 6.7 g/dL (ref 6.5–8.1)
Total Bilirubin: 0.4 mg/dL (ref 0.3–1.2)

## 2014-06-04 LAB — CBC WITH DIFFERENTIAL/PLATELET
Basophils Absolute: 0 10*3/uL (ref 0.0–0.1)
Basophils Relative: 0 % (ref 0–1)
EOS PCT: 3 % (ref 0–5)
Eosinophils Absolute: 0.5 10*3/uL (ref 0.0–0.7)
HEMATOCRIT: 36.3 % (ref 36.0–46.0)
Hemoglobin: 11.4 g/dL — ABNORMAL LOW (ref 12.0–15.0)
Lymphocytes Relative: 18 % (ref 12–46)
Lymphs Abs: 2.4 10*3/uL (ref 0.7–4.0)
MCH: 26.8 pg (ref 26.0–34.0)
MCHC: 31.4 g/dL (ref 30.0–36.0)
MCV: 85.4 fL (ref 78.0–100.0)
Monocytes Absolute: 0.9 10*3/uL (ref 0.1–1.0)
Monocytes Relative: 7 % (ref 3–12)
Neutro Abs: 9.7 10*3/uL — ABNORMAL HIGH (ref 1.7–7.7)
Neutrophils Relative %: 72 % (ref 43–77)
PLATELETS: 331 10*3/uL (ref 150–400)
RBC: 4.25 MIL/uL (ref 3.87–5.11)
RDW: 15.3 % (ref 11.5–15.5)
WBC: 13.5 10*3/uL — AB (ref 4.0–10.5)

## 2014-06-04 MED ORDER — SODIUM CHLORIDE 0.9 % IJ SOLN
10.0000 mL | INTRAMUSCULAR | Status: DC | PRN
  Administered 2014-06-05 – 2014-06-06 (×6): 10 mL
  Administered 2014-06-07 (×2): 30 mL
  Administered 2014-06-09 (×2): 10 mL
  Administered 2014-06-10: 30 mL
  Administered 2014-06-11: 10 mL
  Administered 2014-06-11: 30 mL
  Administered 2014-06-12 – 2014-06-15 (×5): 10 mL
  Filled 2014-06-04 (×18): qty 40

## 2014-06-04 NOTE — Evaluation (Signed)
Speech Language Pathology Assessment and Plan  Patient Details  Name: Jaidin Ugarte MRN: [id number] Date of Birth: 1945/12/14  SLP Diagnosis: Aphasia;Dysarthria;Cognitive Impairments;Dysphagia;Apraxia;Voice disorder  Rehab Potential: Excellent ELOS: 4 weeks    Today's Date: 06/04/2014 SLP Individual Time: 0998-3382 SLP Individual Time Calculation (min): 75 min   Problem List:  Patient Active Problem List   Diagnosis Date Noted  . Embolic stroke   . HLD (hyperlipidemia)   . Aphasia   . Right hemiplegia   . Endotracheal tube present   . Stroke   . 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 Acute respiratory failure, unspecified whether with hypoxia or hypercapnia   . Acute respiratory failure   . Bradycardia 05/23/2014  . Cerebral brain hemorrhage   . Respiratory failure   . Junctional bradycardia   . Arterial hypotension   . ICH (intracerebral hemorrhage) 05/20/2014  . Hypertensive emergency 05/20/2014   Past Medical History:  Past Medical History  Diagnosis Date  . Hypertension   . AAA (abdominal aortic aneurysm)     a. s/p stent grafting @ [facility].  . Hemorrhagic cerebrovascular accident (CVA)     a. 05/20/2014 L hemispheric hemorrhagic CVA s/p decompressive craniotomy and evacuation.  . H/O echocardiogram     a. 04/2014 Echo: EF 65-70%, Gr 1 DD, mild LVH.   Past Surgical History:  Past Surgical History  Procedure Laterality Date  . Abdominal aortic aneurysm repair    . Craniotomy Left 05/20/2014    Procedure: CRANIOTOMY HEMATOMA EVACUATION;  Surgeon: Karie Chimera, MD;  Location: [place] [facility];  Service: Neurosurgery;  Laterality: Left;    Assessment / Plan / Recommendation Clinical Impression Patient is a 69 y.o. right handed female with history of hypertension, recent status post AAA stent grafting at [facility] and was maintained on aspirin and Plavix. 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 Admitted 05/20/2014 after developing severe headache right arm weakness while at work and suddenly became unresponsive. Blood pressure 241/123. Cranial CT scan showed left ICH with 3 mm midline shift. Patient was intubated for airway protection. Underwent left frontoparietal craniotomy for evacuation of intracerebral hematoma 05/20/2014 per Dr. Hal Neer. Nasogastric tube placed for nutritional support. Echocardiogram with ejection fraction of 50% grade 1 diastolic dysfunction. Carotid Dopplers with left 40-59% ICA stenosis. Maintained on Keppra for seizure prophylaxis. EEG showed no seizure activity. Cardiology consulted 05/23/2014 for ectopic atrial bradycardia identified per EKG no acute ST-T changes felt likely vagally mediated from CNS event and monitored with no further workup needed. Latest follow-up cranial CT scan MRI shows stable appearance of residual left frontal parietal hemorrhage following craniotomy there was areas of acute subacute nonhemorrhagic infarct within the occipital lobes bilaterally, posterior left temporal lobe, and left globus pallidus. MR angiogram of head and neck showed 60% stenosis of the proximal left ICA artery. Moderate to high-grade stenosis of the proximal right vertebral artery without significant distal stenosis. Small subarachnoid hemorrhage over the right temporal lobe. Neurology following recently placed on aspirin 81 mg daily 05/27/2014 as well as initiation of subcutaneous heparin for DVT prophylaxis. Patient was extubated 05/30/2014. Swallow study per speech therapy maintained on a dysphagia #2 nectar thick liquid diet. Contact precautions for MRSA PCR screening. Physical therapy evaluation 05/31/2014 with recommendations of physical medicine rehabilitation consult. Patient transferred to CIR on 06/03/2014 and administered a cognitive-linguistic evaluation and BSE. Patient presents with a moderate-severe oral/verbal apraxia and global aphasia which impacts all  four modalities of language and  ability to follow commands and functionally communicate her wants/needs. Patient also demonstrates moderate-severe cognitive impairments characterized by decreased initiation, orientation, sustained attention, attention to right field of body/environment, intellectual awareness and safety which impacts her ability to complete functional and familiar tasks safely. Patient also administered a BSE and required Mod-Max verbal cues for self-feeding in regards to motor planning and impulsivity. Patient demonstrated efficient mastication of Dys. 2 textures with minimal right buccal pocketing that cleared with Min verbal and visual cues. Patient also consumed thin liquids via cup without overt s/s of aspiration except for cough X 1 with large, consecutive sips. Recommend patient continue Dys. 2 textures and upgrade to thin liquids with full supervision for safety. Patient will benefit from skilled SLP intervention to maximize her cognitive-linguistic, speech and swallowing function in order to maximize her functional independence prior to discharge.   Skilled Therapeutic Interventions          Administered a cognitive-linguistic evaluation and BSE. Please see above for details. Educated patient and family in regards to patient's current cognitive-linguistic and swallowing function and goals of skilled SLP intervention.   SLP Assessment  Patient will need skilled Speech Lanaguage Pathology Services during CIR admission    Recommendations  SLP Diet Recommendations: Dysphagia 2 (Fine chop);Thin Liquid Administration via: Cup Medication Administration: Crushed with puree Supervision: Staff to assist with self feeding;Full supervision/cueing for compensatory strategies Compensations: Slow rate;Small sips/bites;Check for pocketing;Check for anterior loss Postural Changes and/or Swallow Maneuvers: Upright 30-60 min after meal;Seated upright 90 degrees Oral Care Recommendations: Oral care  BID Patient destination: [facility] (SNF) Follow up Recommendations: 24 hour supervision/assistance;Skilled Nursing facility Equipment Recommended: None recommended by SLP    SLP Frequency 1 to 3 out of 7 days   SLP Treatment/Interventions Cognitive remediation/compensation;Cueing hierarchy;Environmental controls;Dysphagia/aspiration precaution training;Functional tasks;Internal/external aids;Patient/family education;Speech/Language facilitation;Therapeutic Activities    Pain No/Denies Pain  Prior Functioning Type of Home: House  Lives With: Alone Available Help at Discharge: Family Vocation: Full time employment  Short Term Goals: Week 1: SLP Short Term Goal 1 (Week 1): Patient will consume current diet without overt s/s of aspiration with Mod A verbal cues for use of swallow strategies.  SLP Short Term Goal 2 (Week 1): Patient will scan to midline with Mod A multimodal cues during functional and familiar tasks. SLP Short Term Goal 3 (Week 1): Patient will demonstrate sustained attention to functional and familiar tasks for 5 minutes with Mod A verbal cues for redirection.  SLP Short Term Goal 4 (Week 1): Patient will identify functional items from a field of 2 with 75% accuracy with Mod A multimodal cues.  SLP Short Term Goal 5 (Week 1): Patient will name functional items with Max A multimodal cues with 25% accuracy.  SLP Short Term Goal 6 (Week 1): Patient will phonate on command with Max A multimodal cues in 25% of opportunities .   See FIM for current functional status Refer to Care Plan for Long Term Goals  Recommendations for other services: None  Discharge Criteria: Patient will be discharged from SLP if patient refuses treatment 3 consecutive times without medical reason, if treatment goals not met, if there is a change in medical status, if patient makes no progress towards goals or if patient is discharged from hospital.  The above assessment, treatment plan,  treatment alternatives and goals were discussed and mutually agreed upon: by family  Kalana Yust 06/04/2014, 5:14 PM

## 2014-06-04 NOTE — Progress Notes (Signed)
  [place] [facility]     PROGRESS NOTE    Subjective/Complaints: No distress or problems. ROS limited due to language  Objective: Vital Signs: Blood pressure 149/43, pulse 64, temperature 98.1 F (36.7 C), temperature source Oral, resp. rate 18, weight 87.5 kg (192 lb 14.4 oz), SpO2 98 %. Dg Chest Port 1 View  06/03/2014   CLINICAL DATA:  69 year old hypertensive female with hypoxia. Subsequent encounter.  EXAM: PORTABLE CHEST - 1 VIEW  COMPARISON:  06/01/2010.  FINDINGS: Left PICC line tip mid to distal superior vena cava level. No gross pneumothorax.  Mild rotation.  Heart slightly enlarged.  Calcified tortuous aorta.  Poor inspiration.  Central pulmonary vascular prominence.  No segmental consolidation.  IMPRESSION: Mild central pulmonary vascular prominence.  No segmental consolidation.  Calcified tortuous aorta.   Electronically Signed   By: Lacy DuverneySteven  Olson M.D.   On: 06/03/2014 07:17    Recent Labs  06/03/14 0414 06/04/14 0540  WBC 13.7* 13.5*  HGB 11.2* 11.4*  HCT 36.3 36.3  PLT 334 331    Recent Labs  06/03/14 0414 06/04/14 0540  NA 139 138  K 3.7 3.6  CL 104 102  GLUCOSE 96 96  BUN 13 12  CREATININE 0.60 0.62  CALCIUM 9.1 9.2   CBG (last 3)  No results for input(s): GLUCAP in the last 72 hours.  Wt Readings from Last 3 Encounters:  06/03/14 87.5 kg (192 lb 14.4 oz)  06/03/14 90.4 kg (199 lb 4.7 oz)    Physical Exam:  HENT:  Craniotomy site clean and dry  Eyes:  Pupils reactive to light without nystagmus  Neck: Normal range of motion. Neck supple. No thyromegaly present.  Cardiovascular:  Cardiac rate control  Respiratory:  Poor inspiratory effort but clear to auscultation  GI: Soft. Bowel sounds are normal. She exhibits no distension.  Neurological:  Patient does open her eyes to command. Alert.  Follows commands 25% or less, expressive greater than receptive aphasia. 0/5 RUE and RLE. Spontaneous movement of left arm and  leg.. Right HH. Pinch on RUE and RLE elicits withdrawl Psychiatric:  flat   Assessment/Plan: 1. 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 Physiatrist is providing close team supervision and 24 hour management of active medical problems listed below. Physiatrist and rehab team continue to assess barriers to discharge/monitor patient progress toward functional and medical goals. FIM:                                  Medical Problem List and Plan: 1. 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 DVT Prophylaxis/Anticoagulation: Subcutaneous heparin initiated 05/24/2014. Monitor for any bleeding episodes 3. Pain Management: Tylenol as needed 4. Seizure prophylaxis. Keppra 500 mg twice a day. EEG negative 5. Neuropsych: This patient is not capable of making decisions on her own behalf. 6. Skin/Wound Care: Routine skin checks 7. Fluids/Electrolytes/Nutrition: Follow i's and o's, po intake, check labs.  8. Dysphagia. Dysphagia #2 nectar liquids. Monitor hydration follow-up speech therapy 9. MRSA PCR screening positive. Contact precautions 10. Hypertension. Toprol-XL 25 mg daily. Monitor with increased mobility 11. Hypothyroidism. Synthroid LOS (Days) 1 A FACE TO FACE EVALUATION WAS PERFORMED  Gari Trovato T 06/04/2014 7:20 AM

## 2014-06-04 NOTE — Progress Notes (Signed)
[name] [facility] Admission Coordinator Signed Physical Medicine and Rehabilitation PMR Pre-admission 06/02/2014 3:34 PM  Related encounter: ED to Hosp-Admission (Discharged) from 05/20/2014 in [facility] 2C STEPDOWN    Expand All Collapse All   PMR Admission Coordinator Pre-Admission Assessment  Patient: Carolyn [name] is an 69 y.o., female MRN: [id number] DOB: 13-Sep-1945 Height:  (162.6 cm) Weight: 90.4 kg (199 lb 4.7 oz)  Insurance Information HMO: PPO: PCP: IPA: 80/20: OTHER: EPO Advantage PRIMARY: BCBS [place] Policy#: [id number] Subscriber: self Received authorization for nine days from 06-03-14 through 06-11-14 with updates due to H. Rivera Colen on 06-09-14 CM Name: Dominga Ferry Phone#: [phone], option 1 Fax#: [phone] Pre-Cert#: [id number] Employer: [employer] (working full time) Benefits: Phone #: [phone] Name: Michaele Offer. Date: 01-22-14 Deduct: none Out of Pocket Max: [phone] Life Max: none CIR: $500/per admit, pre-auth needed SNF: $500/per admit, 100 days visit limit Outpatient: 100% Co-Pay: $25 copay, 100 visit limits Home Health: 100% Co-Pay: none, unlimited visits DME: 100% Co-Pay: none, visit limit based on medical necessity Providers: in network  SECONDARY: Medicare A & B Policy#: [id number] Subscriber: self Pre-Cert#: verified in [employer] Employer: [employer]  Eff. Date: A: 07-23-10, B: 11-22-13 Deduct: $1288 Out of Pocket Max: none Life Max: unlimited CIR: 100% SNF: 100% days 1-20; 80% days 21-100 (100 days max) Outpatient: 80% Co-Pay: 20% Home Health: 100% Co-Pay: none, no visit limits DME: 80% Co-Pay: 20%   Emergency Contact Information Contact Information     Name Relation Home Work Mobile   Hemric,Alicia Daughter [phone]  [phone]   Cinderella, Christoffersen   [phone]   Suheily, Birks   [phone]     Current Medical History  Patient Admitting Diagnosis: ICH with dense right HP  History of Present Illness: Carolyn [name] is a 69 y.o. right handed female with history of hypertension, recent status post AAA stent grafting at [facility] and was maintained on aspirin and Plavix.. 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 Admitted 05/20/2014 after after developing severe headache right arm weakness while at work and suddenly became unresponsive. Blood pressure 241/123. Cranial CT scan showed left ICH with 3 mm midline shift. Patient was intubated for airway protection. Underwent left frontoparietal craniotomy for evacuation of intracerebral hematoma 05/20/2014 per Dr. Gerlene Fee. Nasogastric tube placed for nutritional support. Echocardiogram with ejection fraction of 70% grade 1 diastolic dysfunction. Carotid Dopplers with left 40-59% ICA stenosis. Maintained on Keppra for seizure prophylaxis. EEG showed no seizure activity. Cardiology consulted 05/23/2014 for ectopic atrial bradycardia identified per EKG no acute ST-T changes felt likely vagally mediated from CNS event and monitored with no further workup needed. Latest follow-up cranial CT scan MRI shows stable appearance of residual left frontal parietal hemorrhage following craniotomy there was areas of acute subacute nonhemorrhagic infarct within the occipital lobes bilaterally, posterior left temporal lobe, and left globus pallidus. MR angiogram of head and neck showed 60% stenosis of the proximal left ICA artery. Moderate to high-grade stenosis of the proximal right vertebral artery without significant distal stenosis. Small subarachnoid hemorrhage over the right temporal lobe. Neurology following recently  placed on aspirin 81 mg daily 05/27/2014 as well as initiation of subcutaneous heparin for DVT prophylaxis. Patient was extubated 05/30/2014. Swallow study per speech therapy maintained on a dysphagia #2 nectar thick liquid diet. Contact precautions for MRSA PCR screening. Physical therapy evaluation 05/31/2014 with recommendations of physical medicine rehabilitation consult.  NIH Total: 19  Past Medical History  Past  Medical History  Diagnosis Date  . Hypertension   . AAA (abdominal aortic aneurysm)     a. s/p stent grafting @ [facility].  . Hemorrhagic cerebrovascular accident (CVA)     a. 05/20/2014 L hemispheric hemorrhagic CVA s/p decompressive craniotomy and evacuation.  . H/O echocardiogram     a. 04/2014 Echo: EF 65-70%, Gr 1 DD, mild LVH.    Family History  family history is not on file.  Prior Rehab/Hospitalizations: none  Current Medications   Current facility-administered medications:  . 0.9 % sodium chloride infusion, , Intravenous, Continuous, Coralyn Helling, MD, Last Rate: 50 mL/hr at 06/03/14 0828 . acetaminophen (TYLENOL) tablet 650 mg, 650 mg, Per Tube, Q4H PRN **OR** acetaminophen (TYLENOL) suppository 650 mg, 650 mg, Rectal, Q4H PRN, Merwyn Katos, MD . aspirin chewable tablet 81 mg, 81 mg, Oral, Daily, Micki Riley, MD, 81 mg at 06/03/14 1111 . fentaNYL (SUBLIMAZE) injection 25 mcg, 25 mcg, Intravenous, Q2H PRN, Coralyn Helling, MD . heparin injection 5,000 Units, 5,000 Units, Subcutaneous, 3 times per day, Coletta Memos, MD, 5,000 Units at 06/03/14 0535 . levETIRAcetam (KEPPRA) tablet 500 mg, 500 mg, Oral, BID, Darl Householder Masters, RPH, 500 mg at 06/03/14 1110 . levothyroxine (SYNTHROID, LEVOTHROID) tablet 75 mcg, 75 mcg, Oral, QAC breakfast, Darl Householder Masters, RPH, 75 mcg at 06/03/14 0827 . metoprolol succinate (TOPROL-XL) 24 hr tablet 25 mg, 25 mg, Oral, Daily, Marvel Plan, MD, 25 mg at 06/03/14 1111 . multivitamin with  minerals tablet 1 tablet, 1 tablet, Oral, Daily, Marvel Plan, MD, 1 tablet at 06/03/14 1111 . ondansetron (ZOFRAN) injection 4 mg, 4 mg, Intravenous, Q6H PRN, Genelle Gather, MD, 4 mg at 05/20/14 1714 . pantoprazole (PROTONIX) EC tablet 40 mg, 40 mg, Oral, Daily, Darl Householder Masters, RPH, 40 mg at 06/03/14 1111 . sodium chloride 0.9 % injection 10-40 mL, 10-40 mL, Intracatheter, Q12H, Merwyn Katos, MD, 20 mL at 06/03/14 1112 . 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 She is home in the afternoons to get her granddtr off the school bus. She loves to spend time with her grand-children. She also enjoys reading (newspapers, ads and Time magazine). She also loves to do scratch off lottery tickets.   Home Assistive Devices / Equipment Home Assistive Devices/Equipment: None Home Equipment: Shower seat, Grab bars - tub/shower  Prior Functional Level Prior Function Level of Independence: Independent Comments: Pt was fully independent. She worked full time as an Teacher, English as a foreign language  Arousal/Alertness: Awake/alert Overall Cognitive Status: Impaired/Different from baseline Difficult to assess due to: Impaired communication Current Attention Level: Focused Orientation Level: Other (comment) (UTA) Following Commands: Follows one step commands with increased time, Follows one step commands inconsistently (multimodal cues) Safety/Judgement: Decreased awareness of safety, Decreased awareness of deficits General Comments: Pt more alert today and able to  participate. Still with cognitive delays but improving.  Attention: Sustained Sustained Attention: Impaired Sustained Attention Impairment: Verbal basic Memory: (TBA) Awareness: Impaired Awareness Impairment: Intellectual impairment, Emergent impairment, Anticipatory impairment Problem Solving: Impaired Problem Solving Impairment: Verbal basic, Functional basic Safety/Judgment: Impaired   Extremity Assessment (includes Sensation/Coordination)  Upper Extremity Assessment: RUE deficits/detail RUE  Deficits / Details: Appears flaccid. Does not attempt to move or use it. PROM WFL  RUE Coordination: decreased fine motor, decreased gross motor  Lower Extremity Assessment: RLE deficits/detail, LLE deficits/detail RLE Deficits / Details: noted spontaneous flexion of leg, nothing to command    ADLs  Overall ADL's : Needs assistance/impaired Eating/Feeding: Minimal assistance, Sitting Eating/Feeding Details (indicate cue type and reason): pt demonstrated ability to drink with cup, per ST, pt self fed her magic cup with her L hand once assisted to initiate Grooming: Wash/dry face, Minimal assistance, Bed level Grooming Details (indicate cue type and reason): required tactile and verbal cues to initiate, but then completed with tactile cues to wash R side of face Upper Body Bathing: Total assistance, Sitting, Bed level Lower Body Bathing: Total assistance, Bed level Upper Body Dressing : Total assistance, Bed level Lower Body Dressing: Total assistance, Bed level Toilet Transfer: Total assistance Toilet Transfer Details (indicate cue type and reason): unable to safely perform with +1 assist  Toileting- Clothing Manipulation and Hygiene: Total assistance, Bed level Functional mobility during ADLs: Total assistance General ADL Comments: Instructed daughter in positioning R UE on pillow in chair and placement of blankets to assist in decreasing lateral flexion of neck. Educated  daughter to speak to pt from her R side and positioned recliner in room so daughter could sit to pt's right side.    Mobility  Overal bed mobility: Needs Assistance Bed Mobility: Rolling, Sidelying to Sit Rolling: Total assist, +2 for physical assistance (pt =10% reached for rail) Sidelying to sit: Total assist, +2 for physical assistance Supine to sit: Total assist, +2 for safety/equipment Sit to supine: Total assist, +2 for safety/equipment General bed mobility comments: Pt needed total assist to come to EOB. Pt could not push her trunk up without total assist (pt doing 10%). Once up, total assist to scoot pt to eOB with pad as well as to asssit pt with upright sitting.     Transfers  Overall transfer level: Needs assistance Equipment used: None Transfers: Squat Pivot Transfers Squat pivot transfers: Max assist, +2 physical assistance (pt =25% with pt weight bearing on bil LEs for transfer) General transfer comment: Pt required assist for anterior lean as well as assist to stand. Pt able to stand semi upright with max assist and use of pads. Pivoted around with pt trying to move left LE however right LE kicked into extension during transfer. Pt attempted to scoot back in chair but ultimately needed max assist to scoot pt back.     Ambulation / Gait / Stairs / Wheelchair Mobility   not assessed, anticipate needs    Posture / Balance Dynamic Sitting Balance Sitting balance - Comments: pushing with L UE in sitting, sat x 15 minutes working on midline orientation, side to sit Balance Overall balance assessment: Needs assistance, History of Falls Sitting-balance support: Bilateral upper extremity supported, Feet supported Sitting balance-Leahy Scale: Poor Sitting balance - Comments: pushing with L UE in sitting, sat x 15 minutes working on midline orientation, side to sit Postural control: Right lateral lean    Special needs/care consideration BiPAP/CPAP no CPM no   Continuous Drip IV no  Dialysis no  Life Vest no  Oxygen no  Special Bed no  Trach Size no  Wound Vac (area) no  Skin - staples along L parietal area  Bowel mgmt: last BM on 06-02-14 Bladder mgmt: + urinary incontinence. Pt did use bedpan for first time last night. Per dtr, pt indicates her need  to use the restroom by shaking her L LE and getting restless. Diabetic mgmt no   Previous Home Environment Living Arrangements: Alone Lives With: Alone Available Help at Discharge: Family (all children work) Type of Home: House Home Layout: One level Home Access: Stairs to enter Secretary/administratorntrance Stairs-Number of Steps: threshold  Bathroom Shower/Tub: Tub/shower unit, Health visitorWalk-in shower Bathroom Toilet: Standard Home Care Services: No Additional Comments: Pt lived alone. Her family is investigating options to provide 24 hour assist and are agreeable to pursuing SNF after CIR.  Discharge Living Setting Plans for Discharge Living Setting: Other (Comment) (plan is for SNF as children cannot provide care) Type of Home at Discharge: Skilled Nursing Facility Care Facility Name at Discharge: to be determined Does the patient have any problems obtaining your medications?: Yes (Describe)  Social/Family/Support Systems Patient Roles: Other (Comment) (worked full time as Corporate treasureradmin. assistant for [employer]) Contact Information: dtr Helmut Musterlicia Hemrick is primary contact Anticipated Caregiver: the plan is for SNF after CIR as all children work. Anticipated Caregiver's Contact Information: see above Ability/Limitations of Caregiver: all three children work (dtr works at [employer] and states they may consider her for SNF) Caregiver Availability: Other (Comment) (all three children work and would not be avail for support) Discharge Plan Discussed with Primary Caregiver: Yes Is Caregiver In Agreement with Plan?: Yes Does Caregiver/Family have Issues with  Lodging/Transportation while Pt is in Rehab?: No  Goals/Additional Needs Patient/Family Goal for Rehab: Minimal to Moderate assist with PT/OT; Supervision to minimal to moderate assistance with SLP Expected length of stay: 20-30 days Cultural Considerations: none Equipment Needs: to be determined Pt/Family Agrees to Admission and willing to participate: Yes (family is aware of the plan for SNF after CIR) Program Orientation Provided & Reviewed with Pt/Caregiver Including Roles & Responsibilities: Yes   Decrease burden of Care through IP rehab admission: This was discussed between Dr. Riley KillSwartz and Darl PikesSusan, admission coordinator. Per that discussion, it is possible to consider this admit as decreasing burden of care. However, Dr. Rosalyn ChartersSwartz's initial rehab goals indicate that pt may make more progress and possibly get to a minimal to moderate level. This determination will be clarified as pt participates in the program. Diet advancement, Decrease number of caregivers, Bowel and bladder program and Patient/family education.  Possible need for SNF placement upon discharge: yes, this is likely as all adult children work. Pt's daughter, Helmut Musterlicia states she is employed by [employer] and they have agreed to consider her mom for SNF there. It was explained that possible SNF placement would be dependent on insurance approval and if SNF is in network with BCBS of [place].  Patient Condition: This patient's medical and functional status has changed since the consult dated: 06-01-14 in which the Rehabilitation Physician determined and documented that the patient's condition is appropriate for intensive rehabilitative care in an inpatient rehabilitation facility. See "History of Present Illness" (above) for medical update. Functional changes are: total assistance with all functional mobility. Patient's medical and functional status update has been discussed with the Rehabilitation physician and patient remains appropriate for  inpatient rehabilitation. Will admit to inpatient rehab today.  Preadmission Screen Completed By: Juliann MuleJanine Shaylon Gillean, PT, 06/03/2014 12:58 PM ______________________________________________________________________  Discussed status with Dr. Wynn BankerKirsteins on 06-03-14 at 1258 and received telephone approval for admission today.  Admission Coordinator: Juliann MuleJanine Ladonya Jerkins, PT, time1258/Date 06-03-14          Cosigned by: Erick ColaceAndrew E Kirsteins, MD at 06/03/2014 1:19 PM  Revision History     Date/Time User Provider Type  Action   06/03/2014 1:19 PM Erick ColaceAndrew E Kirsteins, MD Physician Cosign   06/03/2014 1:05 PM Thane EduJanine L Isidoro Santillana Rehab Admission Coordinator Sign

## 2014-06-04 NOTE — Evaluation (Signed)
Physical Therapy Assessment and Plan  Patient Details  Name: Carolyn [name] MRN: [id number] Date of Birth: February 19, 1945  PT Diagnosis: Abnormal posture, Abnormality of gait, Cognitive deficits, Hemiparesis dominant, Hypotonia, Impaired sensation and Muscle weakness Rehab Potential: Fair ELOS: 26-28 days   Today's Date: 06/04/2014 PT Individual Time: 9528-4132 PT Individual Time Calculation (min): 90 min    Problem List:  Patient Active Problem List   Diagnosis Date Noted  . Embolic stroke   . HLD (hyperlipidemia)   . Aphasia   . Right hemiplegia   . Endotracheal tube present   . Stroke   . 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 Acute respiratory failure, unspecified whether with hypoxia or hypercapnia   . Acute respiratory failure   . Bradycardia 05/23/2014  . Cerebral brain hemorrhage   . Respiratory failure   . Junctional bradycardia   . Arterial hypotension   . ICH (intracerebral hemorrhage) 05/20/2014  . Hypertensive emergency 05/20/2014    Past Medical History:  Past Medical History  Diagnosis Date  . Hypertension   . AAA (abdominal aortic aneurysm)     a. s/p stent grafting @ [facility].  . Hemorrhagic cerebrovascular accident (CVA)     a. 05/20/2014 L hemispheric hemorrhagic CVA s/p decompressive craniotomy and evacuation.  . H/O echocardiogram     a. 04/2014 Echo: EF 65-70%, Gr 1 DD, mild LVH.   Past Surgical History:  Past Surgical History  Procedure Laterality Date  . Abdominal aortic aneurysm repair    . Craniotomy Left 05/20/2014    Procedure: CRANIOTOMY HEMATOMA EVACUATION;  Surgeon: Karie Chimera, MD;  Location: [facility];  Service: Neurosurgery;  Laterality: Left;    Assessment & Plan Clinical Impression: Carolyn [name] is a 69 y.o. right handed female with history of hypertension, recent status post AAA stent grafting at [facility] and was maintained on aspirin and Plavix.. By report patient lived alone prior to admission  working as an Web designer. Admitted 05/20/2014 after after developing severe headache right arm weakness while at work and suddenly became unresponsive. Blood pressure 241/123. Cranial CT scan showed left ICH with 3 mm midline shift. Patient was intubated for airway protection. Underwent left frontoparietal craniotomy for evacuation of intracerebral hematoma 05/20/2014 per Dr. Hal Neer. Nasogastric tube placed for nutritional support. Echocardiogram with ejection fraction of 44% grade 1 diastolic dysfunction. Carotid Dopplers with left 40-59% ICA stenosis. Maintained on Keppra for seizure prophylaxis. EEG showed no seizure activity. Cardiology consulted 05/23/2014 for ectopic atrial bradycardia identified per EKG no acute ST-T changes felt likely vagally mediated from CNS event and monitored with no further workup needed. Latest follow-up cranial CT scan MRI shows stable appearance of residual left frontal parietal hemorrhage following craniotomy there was areas of acute subacute nonhemorrhagic infarct within the occipital lobes bilaterally, posterior left temporal lobe, and left globus pallidus. MR angiogram of head and neck showed 60% stenosis of the proximal left ICA artery. Moderate to high-grade stenosis of the proximal right vertebral artery without significant distal stenosis. Small subarachnoid hemorrhage over the right temporal lobe. Neurology following recently placed on aspirin 81 mg daily 05/27/2014 as well as initiation of subcutaneous heparin for DVT prophylaxis. Patient was extubated 05/30/2014. Swallow study per speech therapy maintained on a dysphagia #2 nectar thick liquid diet. Contact precautions for MRSA PCR screening. Physical therapy evaluation 05/31/2014 with recommendations of physical medicine rehabilitation consult. Patient transferred to CIR on 06/03/2014.   Patient currently requires +2 assist with mobility secondary to muscle weakness, decreased cardiorespiratoy  endurance,  impaired timing and sequencing, abnormal tone, unbalanced muscle activation, motor apraxia, decreased coordination and decreased motor planning, field cut, decreased midline orientation and decreased attention to right and decreased initiation, decreased attention, decreased awareness, decreased problem solving, decreased safety awareness, decreased memory and delayed processing.  Prior to hospitalization, patient was independent  with mobility and lived with Alone in a House home.  Home access is threshold Stairs to enter.  Patient will benefit from skilled PT intervention to maximize safe functional mobility, minimize fall risk and decrease caregiver burden for planned discharge home vs SNF with 24 hour assist.  Anticipate patient will benefit from follow up [facility] at discharge.  PT - End of Session Activity Tolerance: Decreased this session;Tolerates 30+ min activity with multiple rests Endurance Deficit: Yes Endurance Deficit Description: fatigued quickly with sitting balance and static standing  PT Assessment Rehab Potential (ACUTE/IP ONLY): Fair Barriers to Discharge: Decreased caregiver support PT Patient demonstrates impairments in the following area(s): Balance;Behavior;Edema;Endurance;Motor;Nutrition;Pain;Perception;Safety;Sensory PT Transfers Functional Problem(s): Bed Mobility;Bed to Chair;Car;Furniture PT Locomotion Functional Problem(s): Ambulation;Wheelchair Mobility;Stairs PT Plan PT Intensity: Minimum of 1-2 x/day ,45 to 90 minutes PT Frequency: 5 out of 7 days PT Duration Estimated Length of Stay: 26-28 days PT Treatment/Interventions: Ambulation/gait training;Balance/vestibular training;Cognitive remediation/compensation;Functional electrical stimulation;Discharge planning;Disease management/prevention;DME/adaptive equipment instruction;Functional mobility training;Neuromuscular re-education;Pain management;Patient/family education;Psychosocial support;Splinting/orthotics;Stair  training;Therapeutic Activities;Therapeutic Exercise;UE/LE Strength taining/ROM;UE/LE Coordination activities;Visual/perceptual remediation/compensation;Wheelchair propulsion/positioning PT Transfers Anticipated Outcome(s): mod A PT Locomotion Anticipated Outcome(s): mod A wheelchair level PT Recommendation Follow Up Recommendations: Skilled nursing facility;24 hour supervision/assistance Patient destination: [place] (SNF) (home v SNF due to 24/7 A unavailable) Equipment Recommended: To be determined  Skilled Therapeutic Intervention Skilled therapeutic intervention initiated following evaluation. No family present on eval, history obtained via medical record. Patient received asleep in bed, incontinent of urine. Required +2 assist for rolling to R/L to doff/don brief and perform hygiene total A. Patient lethargic at beginning of session with heavy lean to R in sitting that progressed to pushing to R throughout session as patient became more alert. Patient required total multimodal cues to decrease pushing through LUE and to reposition head at midline due to cervical lateral sidebending to R and strong L gaze preference due to R field cut/inattention. See below for further details on standing, transfers, and initiation of gait. Instructed patient in wheelchair propulsion using LUE with total HOH assist, but patient unable to sequence to due apraxia and impaired cognition. Patient followed basic one step commands < 10% of time and only nodded "yes" to all questions and smiled. Patient was able to track to R past midline with max multimodal A cues for brief periods of time > 5 sec. Switched out patient's wheelchair to tilt in space wheelchair with head rest for improved OOB seating tolerance, midline orientation, head/neck positioning, safety, and improved pressure relief. Recommend nursing staff utilize [facility] for transfers at this time, safety plan updated. Patient left sitting in TIS  wheelchair with quick release belt on at RN station, RN notified of patient grimacing during standing activities and patient location.   PT Evaluation Precautions/Restrictions Precautions Precautions: Fall Precaution Comments: R field cut/R inattention, pushes to R  Restrictions Weight Bearing Restrictions: No General Chart Reviewed: Yes Family/Caregiver Present: No Pain Pain Assessment Pain Assessment: Faces Pain Score: 6  Pain Descriptors / Indicators: Crushing;Grimacing Pain Onset: With Activity Patients Stated Pain Goal: 2 Pain Intervention(s): Medication (See eMAR);Repositioned Home Living/Prior Functioning Home Living Available Help at Discharge: Family Type of Home: House Home Access: Stairs to enter  Entrance Stairs-Number of Steps: threshold  Home Layout: One level  Lives With: Alone Prior Function Level of Independence: Independent with basic ADLs;Independent with homemaking with ambulation;Independent with gait;Independent with transfers Driving: Yes Vocation: Full time employment Vocation Requirements: Web designer Leisure: Hobbies-yes (Comment) Comments: grandkids, scrabble, likes lottery scratch offs  Vision/Perception  Vision - Assessment Eye Alignment: Within Functional Limits Ocular Range of Motion: Within Functional Limits Alignment/Gaze Preference: Gaze left Tracking/Visual Pursuits: Impaired - to be further tested in functional context Saccades: Impaired - to be further tested in functional context Convergence: Impaired - to be further tested in functional context  Cognition Overall Cognitive Status: Impaired/Different from baseline Arousal/Alertness: Lethargic Orientation Level: Disoriented to situation;Oriented to place Attention: Sustained Sustained Attention: Impaired Sustained Attention Impairment: Verbal basic;Functional basic Memory: Impaired Memory Impairment: Decreased recall of new information Awareness: Impaired Awareness  Impairment: Intellectual impairment Problem Solving: Impaired Problem Solving Impairment: Functional basic Executive Function: Initiating Initiating: Impaired Initiating Impairment: Functional basic Safety/Judgment: Impaired Sensation Sensation Light Touch: Impaired by gross assessment (RUE/RLE) Stereognosis: Not tested Hot/Cold: Not tested Proprioception: Impaired by gross assessment (RUE/RLE) Additional Comments: limited by cognition/aphasia Coordination Gross Motor Movements are Fluid and Coordinated: No Fine Motor Movements are Fluid and Coordinated: No Coordination and Movement Description: RUE/RLE flaccid Motor  Motor Motor: Hemiplegia;Motor apraxia;Abnormal postural alignment and control Motor - Skilled Clinical Observations: Pushes to R, RUE/LE hemiplegia   Mobility Bed Mobility Bed Mobility: Supine to Sit Supine to Sit: 1: +2 Total assist;HOB flat Supine to Sit Details: Verbal cues for technique;Tactile cues for initiation;Tactile cues for sequencing;Visual cues/gestures for sequencing;Manual facilitation for weight shifting;Manual facilitation for placement Transfers Transfers: Yes Sit to Stand: From chair/3-in-1;1: +2 Total assist;From bed Sit to Stand Details: Manual facilitation for weight bearing;Manual facilitation for placement;Manual facilitation for weight shifting Sit to Stand Details (indicate cue type and reason): 3 musketeers Stand to Sit: 1: +2 Total assist;To bed;To chair/3-in-1 Stand to Sit Details (indicate cue type and reason): Manual facilitation for weight shifting;Manual facilitation for weight bearing;Manual facilitation for placement Stand to Sit Details: 3 musketeers Lateral/Scoot Transfers: 1: +2 Total assist;With armrests removed Lateral/Scoot Transfer Details: Manual facilitation for weight shifting;Manual facilitation for weight bearing;Manual facilitation for placement;Visual cues/gestures for sequencing;Verbal cues for sequencing Locomotion   Ambulation Ambulation: Yes Ambulation/Gait Assistance: 1: +2 Total assist Ambulation Distance (Feet): 2 Feet (4 steps) Assistive device: 2 person hand held assist (3 musketeers) Gait Gait: Yes Gait Pattern: Impaired Gait Pattern: Step-to pattern;Decreased stride length;Decreased stance time - left;Decreased step length - left;Decreased step length - right;Decreased weight shift to left;Shuffle;Trunk flexed;Narrow base of support (total A to advance/stabilize RLE) Gait velocity: decreased Stairs / Additional Locomotion Stairs: No (unsafe to attempt on eval) Product manager Mobility: Yes Wheelchair Assistance: 1: +1 Total assist Wheelchair Assistance Details: Verbal cues for sequencing;Visual cues/gestures for sequencing;Tactile cues for initiation;Tactile cues for placement;Tactile cues for sequencing [facility]) Wheelchair Propulsion: Left upper extremity Wheelchair Parts Management: Needs assistance Distance: 5 ft  Trunk/Postural Assessment  Cervical Assessment Cervical Assessment: Exceptions to [facility] (laterally flexed to R, poor head control) Thoracic Assessment Thoracic Assessment: Exceptions to [facility] (kyphotic) Lumbar Assessment Lumbar Assessment: Exceptions to [facility] (posterior pelvic tilt) Postural Control Head Control: keeps neck laterally flexed to R, when corrected, able to maintain midline for < 1 min Trunk Control: pushes to R and posterior Protective Responses: absent  Balance Static Sitting Balance Static Sitting - Level of Assistance: 2: Max assist;3: Mod assist Dynamic Sitting Balance Dynamic Sitting - Level of Assistance: 1: +2 Total assist  Sitting balance - Comments: Pushing with LUE in sitting, improved with L hand on lap Static Standing Balance Static Standing - Level of Assistance: 1: +2 Total assist Extremity Assessment  RUE Assessment RUE Assessment: Exceptions to [facility] RUE PROM (degrees) Overall PROM Right Upper Extremity: Within functional limits  for tasks performed RUE Tone RUE Tone: Flaccid LUE Assessment LUE Assessment: Within Functional Limits RLE Assessment RLE Assessment: Exceptions to [facility] RLE Strength RLE Overall Strength: Deficits;Due to impaired cognition RLE Overall Strength Comments: 0/5, no spontaneous movement noted, unable to formally assess due to cognition/aphasia RLE Tone RLE Tone: Flaccid LLE Assessment LLE Assessment: Within Functional Limits  FIM:  FIM - Control and instrumentation engineer Devices: Arm rests Bed/Chair Transfer: 1: Two helpers FIM - Locomotion: Wheelchair Distance: 5 ft Locomotion: Wheelchair: 1: Total Assistance/staff pushes wheelchair (Pt<25%) FIM - Locomotion: Ambulation Locomotion: Ambulation Assistive Devices: Other (comment) (3 musketeers) Ambulation/Gait Assistance: 1: +2 Total assist Locomotion: Ambulation: 1: Two helpers FIM - Locomotion: Stairs Locomotion: Stairs: 0: Activity did not occur (unsafe to attempt on eval)   Refer to Care Plan for Long Term Goals  Recommendations for other services: None  Discharge Criteria: Patient will be discharged from PT if patient refuses treatment 3 consecutive times without medical reason, if treatment goals not met, if there is a change in medical status, if patient makes no progress towards goals or if patient is discharged from hospital.  The above assessment, treatment plan, treatment alternatives and goals were discussed and mutually agreed upon: No family available/patient unable  Laretta Alstrom 06/04/2014, 5:21 PM

## 2014-06-04 NOTE — Progress Notes (Signed)
Patient information reviewed and entered into eRehab system by Sussie Minor, RN, CRRN, PPS Coordinator.  Information including medical coding and functional independence measure will be reviewed and updated through discharge.     Per nursing patient was given "Data Collection Information Summary for Patients in Inpatient Rehabilitation Facilities with attached "Privacy Act Statement-Health Care Records" upon admission.  

## 2014-06-04 NOTE — IPOC Note (Signed)
Overall Plan of Care [facility](IPOC) Patient Details Name: Carolyn [name] MRN: [id number] DOB: March 25, 1945  Admitting Diagnosis: NON TRAUMATIC ICH  Hospital Problems: Active Problems:   ICH (intracerebral hemorrhage)   Aphasia   Right hemiplegia     Functional Problem List: Nursing Bladder, Bowel, Perception, Safety, Endurance, Sensory, Nutrition, Medication Management, Motor  PT Balance, Behavior, Edema, Endurance, Motor, Nutrition, Pain, Perception, Safety, Sensory  OT Balance, Cognition, Endurance, Motor, Perception, Safety, Sensory, Vision  SLP Cognition  TR         Basic ADL's: OT Eating, Grooming, Bathing, Dressing, Toileting     Advanced  ADL's: OT       Transfers: PT Bed Mobility, Bed to Chair, Car, Occupational psychologisturniture  OT Toilet, Research scientist (life sciences)Tub/Shower     Locomotion: PT Ambulation, Psychologist, prison and probation servicesWheelchair Mobility, Stairs     Additional Impairments: OT Fuctional Use of Upper Extremity  SLP Swallowing, Communication, Social Cognition comprehension, expression Social Interaction, Problem Solving, Memory, Attention, Awareness  TR      Anticipated Outcomes Item Anticipated Outcome  Self Feeding Supervision  Swallowing  Min A with least restrictive diet   Basic self-care  min A  Toileting  min A   Bathroom Transfers min A  Bowel/Bladder  max assist with bowel and bladder   Transfers  mod A  Locomotion  mod A wheelchair level  Communication  Min-Mod A  Cognition  Min-Mod A   Pain  n/a  Safety/Judgment  maintain safety with max assist    Therapy Plan: PT Intensity: Minimum of 1-2 x/day ,45 to 90 minutes PT Frequency: 5 out of 7 days PT Duration Estimated Length of Stay: 26-28 days OT Intensity: Minimum of 1-2 x/day, 45 to 90 minutes OT Frequency: 5 out of 7 days OT Duration/Estimated Length of Stay: 26-28 days SLP Intensity: Minumum of 1-2 x/day, 30 to 90 minutes SLP Frequency: 1 to 3 out of 7 days SLP Duration/Estimated Length of Stay: 4 weeks       Team  Interventions: Nursing Interventions Patient/Family Education, Disease Management/Prevention, Discharge Planning, Cognitive Remediation/Compensation, Bladder Management, Bowel Management, Medication Management, Dysphagia/Aspiration Precaution Training  PT interventions Ambulation/gait training, Balance/vestibular training, Cognitive remediation/compensation, Functional electrical stimulation, Discharge planning, Disease management/prevention, DME/adaptive equipment instruction, Functional mobility training, Neuromuscular re-education, Pain management, Patient/family education, Psychosocial support, Splinting/orthotics, Stair training, Therapeutic Activities, Therapeutic Exercise, UE/LE Strength taining/ROM, UE/LE Coordination activities, Visual/perceptual remediation/compensation, Wheelchair propulsion/positioning  OT Interventions Balance/vestibular training, Cognitive remediation/compensation, Discharge planning, DME/adaptive equipment instruction, Functional mobility training, Neuromuscular re-education, Patient/family education, Psychosocial support, Self Care/advanced ADL retraining, Therapeutic Activities, Therapeutic Exercise, Visual/perceptual remediation/compensation  SLP Interventions Cognitive remediation/compensation, Cueing hierarchy, Environmental controls, Dysphagia/aspiration precaution training, Functional tasks, Internal/external aids, Patient/family education, Speech/Language facilitation, Therapeutic Activities  TR Interventions    SW/CM Interventions Discharge Planning, Psychosocial Support, Patient/Family Education    Team Discharge Planning: Destination: PT-Skilled Nursing Facility (SNF) (home v SNF due to 24/7 A unavailable) ,OT- Home , SLP-Skilled Nursing Facility (SNF) Projected Follow-up: PT-Skilled nursing facility, 24 hour supervision/assistance, OT-  Home health OT, 24 hour supervision/assistance, SLP-24 hour supervision/assistance, Skilled Nursing facility Projected  Equipment Needs: PT-To be determined, OT- 3 in 1 bedside comode, Tub/shower bench, SLP-None recommended by SLP Equipment Details: PT- , OT-  Patient/family involved in discharge planning: PT- Patient unable/family or caregiver not available,  OT-Patient, Family member/caregiver, SLP-Patient  MD ELOS: 24-28 days Medical Rehab Prognosis:  Excellent Assessment: The patient has been admitted for CIR therapies with the diagnosis of CVA. The team will be addressing functional mobility, strength, stamina, balance, safety, adaptive techniques and  equipment, self-care, bowel and bladder mgt, patient and caregiver education, NMR, tone mgt, cognitive-linguistic treatment, coping skills, ego support, swallowing, communication, leisure awareness. Goals have been set at min assist for self-care and basic mobility and min to mod assist for communication/language/cognition.    Ranelle OysterZachary T. Jalie Eiland, MD, FAAPMR      See Team Conference Notes for weekly updates to the plan of care

## 2014-06-04 NOTE — Progress Notes (Signed)
Ranelle OysterZachary T Swartz, MD Physician Signed Physical Medicine and Rehabilitation Consult Note 06/01/2014 5:35 AM  Related encounter: ED to Hosp-Admission (Discharged) [facility]    Expand All Collapse All        Physical Medicine and Rehabilitation Consult Reason for Consult: Nontraumatic ICH Referring Physician: Critical care   HPI: Carolyn [name] is a 69 y.o. right handed female with history of hypertension, recent status post AAA stent grafting at [facility] and was maintained on aspirin and Plavix.. 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 Admitted 05/20/2014 after after developing severe headache right arm weakness while at work and suddenly became unresponsive. Blood pressure 241/123. Cranial CT scan showed left ICH with 3 mm midline shift. Patient was intubated for airway protection. Underwent left frontoparietal craniotomy for evacuation of intracerebral hematoma 05/20/2014 per Dr. [name]. Nasogastric tube placed for nutritional support. Echocardiogram with ejection fraction of 70% grade 1 diastolic dysfunction. Carotid Dopplers with left 40-59% ICA stenosis. Maintained on Keppra for seizure prophylaxis. EEG showed no seizure activity. Cardiology consulted 05/23/2014 for ectopic atrial bradycardia identified per EKG no acute ST-T changes felt likely vagally mediated from CNS event and monitored with no further workup needed. Latest follow-up cranial CT scan MRI shows stable appearance of residual left frontal parietal hemorrhage following craniotomy there was areas of acute subacute nonhemorrhagic infarct within the occipital lobes bilaterally, posterior left temporal lobe, and left globus pallidus. MR angiogram of head and neck showed 60% stenosis of the proximal left ICA artery. Moderate to high-grade stenosis of the proximal right vertebral artery without significant distal  stenosis. Small subarachnoid hemorrhage over the right temporal lobe. Neurology following recently placed on aspirin 81 mg daily 05/27/2014 as well as initiation of subcutaneous heparin for DVT prophylaxis. Patient was extubated 05/30/2014. Swallow study per speech therapy maintained on a dysphagia #2 nectar thick liquid diet. Contact precautions for MRSA PCR screening. Physical therapy evaluation 05/31/2014 with recommendations of physical medicine rehabilitation consult.   Review of Systems  Unable to perform ROS: acuity of condition  All other systems reviewed and are negative.  Past Medical History  Diagnosis Date  . Hypertension   . AAA (abdominal aortic aneurysm)     a. s/p stent grafting @ [facility].  . Hemorrhagic cerebrovascular accident (CVA)     a. 05/20/2014 L hemispheric hemorrhagic CVA s/p decompressive craniotomy and evacuation.  . H/O echocardiogram     a. 04/2014 Echo: EF 65-70%, Gr 1 DD, mild LVH.   Past Surgical History  Procedure Laterality Date  . Abdominal aortic aneurysm repair    . Craniotomy Left 05/20/2014    Procedure: CRANIOTOMY HEMATOMA EVACUATION; Surgeon: Aliene Beamsandy Kritzer, MD; Location: [facility]; Service: Neurosurgery; Laterality: Left;   History reviewed. No pertinent family history. Social History:  reports that she quit smoking about 9 years ago. She does not have any smokeless tobacco history on file. She reports that she drinks alcohol. She reports that she does not use illicit drugs. Allergies:  Allergies  Allergen Reactions  . Statins Other (See Comments)    reductis inhibitors  . Adhesive [Tape] Rash  . Latex Rash  . Other Rash    Medical tape and bandaids   Medications Prior to Admission  Medication Sig Dispense Refill  . aspirin EC 81 MG tablet Take 81 mg by mouth daily.    . clopidogrel (PLAVIX) 75 MG tablet Take 75 mg by mouth daily.    .Marland Kitchen  levothyroxine  (SYNTHROID, LEVOTHROID) 75 MCG tablet Take 75 mcg by mouth daily before breakfast.    . metoprolol succinate (TOPROL-XL) 25 MG 24 hr tablet Take 25 mg by mouth daily.    . Multiple Vitamin (MULTIVITAMIN WITH MINERALS) TABS tablet Take 1 tablet by mouth daily.      Home: Home Living Family/patient expects to be discharged to:: Private residence Living Arrangements: Alone Available Help at Discharge: Family, Available 24 hours/day Type of Home: House Home Access: Stairs to enter [employer] CorporationEntrance Stairs-Number of Steps: threshold  Home Layout: One level Home Equipment: Shower seat, Grab bars - tub/shower Additional Comments: Pt lived alone. Her family is investigating options to provide 24 hour assist  Lives With: Alone  Functional History: Prior Function Level of Independence: Independent Comments: Pt was fully independent. She worked full time as an Theatre manageradministrative assistant  Functional Status:  Mobility: Bed Mobility Overal bed mobility: Needs Assistance Bed Mobility: Sit to Supine, Supine to Sit Supine to sit: Total assist, +2 for safety/equipment Sit to supine: Total assist, +2 for safety/equipment General bed mobility comments: pt too fatigued to assist coming to EOB Transfers Overall transfer level: Needs assistance General transfer comment: pt too lethargic to attempt stand this afternoon      ADL: ADL Overall ADL's : Needs assistance/impaired Eating/Feeding: Maximal assistance, Bed level Grooming: Wash/dry hands, Brushing hair, Total assistance, Bed level, Sitting Grooming Details (indicate cue type and reason): Pt took comb from therapist, but made no attempt to use it. Attempted Hand over hand assist, but pt resists activity  Upper Body Bathing: Total assistance, Sitting, Bed level Lower Body Bathing: Total assistance, Bed level Upper Body Dressing : Total assistance, Bed level Lower Body Dressing: Total assistance, Bed level Toilet Transfer: Total  assistance Toilet Transfer Details (indicate cue type and reason): unable to safely perform with +1 assist  Toileting- Clothing Manipulation and Hygiene: Total assistance, Bed level Functional mobility during ADLs: Total assistance  Cognition: Cognition Overall Cognitive Status: Difficult to assess Arousal/Alertness: Awake/alert Orientation Level: Other (comment) Attention: Sustained Sustained Attention: Impaired Sustained Attention Impairment: Verbal basic Memory: (TBA) Awareness: Impaired Awareness Impairment: Intellectual impairment, Emergent impairment, Anticipatory impairment Problem Solving: Impaired Problem Solving Impairment: Verbal basic, Functional basic Safety/Judgment: Impaired Cognition Arousal/Alertness: Awake/alert, Lethargic Behavior During Therapy: Flat affect Overall Cognitive Status: Difficult to assess Difficult to assess due to: Level of arousal, Impaired communication  Blood pressure 162/66, pulse 63, temperature 98 F (36.7 C), temperature source Oral, resp. rate 15, height 5\' 4"  (1.626 m), weight 94.8 kg (208 lb 15.9 oz), SpO2 97 %. Physical Exam  HENT:  Craniotomy site clean and dry  Eyes:  Pupils reactive to light without nystagmus  Neck: Normal range of motion. Neck supple. No thyromegaly present.  Cardiovascular:  Cardiac rate control  Respiratory:  Poor inspiratory effort but clear to auscultation  GI: Soft. Bowel sounds are normal. She exhibits no distension.  Neurological:  Patient does open her eyes to command. Lethargic which did limit exam. Follows commands 25% or less, expressive greater than receptive aphasia. 0/5 RUE and RLE. Does spontaneously move left but inconsistent. Appears to have large right field cut also.  Psychiatric:  flat     Lab Results Last 24 Hours    Results for orders placed or performed during the hospital encounter of 05/20/14 (from the past 24 hour(s))  Glucose, capillary Status: None   Collection  Time: 05/31/14 7:51 AM  Result Value Ref Range   Glucose-Capillary 99 70 - 99 mg/dL  Basic metabolic  panel Status: None   Collection Time: 06/01/14 4:51 AM  Result Value Ref Range   Sodium 142 135 - 145 mmol/L   Potassium 3.6 3.5 - 5.1 mmol/L   Chloride 104 101 - 111 mmol/L   CO2 25 22 - 32 mmol/L   Glucose, Bld 92 70 - 99 mg/dL   BUN 20 6 - 20 mg/dL   Creatinine, Ser 9.60 0.44 - 1.00 mg/dL   Calcium 9.1 8.9 - 45.4 mg/dL   GFR calc non Af Amer >60 >60 mL/min   GFR calc Af Amer >60 >60 mL/min   Anion gap 13 5 - 15      Imaging Results (Last 48 hours)    Dg Chest Port 1 View  05/31/2014 CLINICAL DATA: Respiratory failure EXAM: PORTABLE CHEST - 1 VIEW COMPARISON: 05/29/2014 FINDINGS: Left upper extremity PICC, tip at the upper SVC. Borderline cardiomegaly, accentuated by technique. Stable mild aortic tortuosity. Stable inflation after tracheal and esophageal extubation, with mild residual interstitial crowding at the left base. There is no suspected edema, consolidation, effusion, or pneumothorax. IMPRESSION: Stable inflation post extubation. Electronically Signed By: Marnee Grieb M.D. On: 05/31/2014 07:54   Dg Swallowing Func-speech Pathology  05/31/2014 Objective Swallowing Evaluation: Patient Details Name: Myesha Stillion MRN: [id number] Date of Birth: 19-Feb-1945 Today's Date: 05/31/2014 Time: SLP Start Time (ACUTE ONLY): 1110-SLP Stop Time (ACUTE ONLY): 1130 SLP Time Calculation (min) (ACUTE ONLY): 20 min Past Medical History: Past Medical History Diagnosis Date . Hypertension . AAA (abdominal aortic aneurysm) a. s/p stent grafting @ [facility]. . Hemorrhagic cerebrovascular accident (CVA) a. 05/20/2014 L hemispheric hemorrhagic CVA s/p decompressive craniotomy and evacuation. . H/O echocardiogram a. 04/2014 Echo: EF 65-70%, Gr 1 DD, mild LVH. Past Surgical History: Past Surgical  History Procedure Laterality Date . Abdominal aortic aneurysm repair . Craniotomy Left 05/20/2014 Procedure: CRANIOTOMY HEMATOMA EVACUATION; Surgeon: Aliene Beams, MD; Location: [facility]; Service: Neurosurgery; Laterality: Left; HPI: Other Pertinent Information: 69 yr old with PMH: HTN, AAA 2015, renal difficulty admitted as a code stroke. CT large left frontal parietal intracerebral hemorrhage with a 7.5 mm with extention into the left lateral ventricle. Underwent craniotomy 4/28. Intubated 4/28-5/8. CXR 5/6 Persistent low lung volumes with mild stable subsegmental atelectasis and/or infiltrates and repeat CXR 5/7 the lungs are grossly clear. No Data Recorded Assessment / Plan / Recommendation CHL IP CLINICAL IMPRESSIONS 05/31/2014 Therapy Diagnosis Moderate oral phase dysphagia;Mild pharyngeal phase dysphagia Clinical Impression Pt presents with a primary oral phase dysphagia with apraxia and right labial and buccal weakness and sensory deficits resulting in severe anterior spillage and buccal residuals on the right. Lingual manipulation for mastication of solids is impaired; pt transited unmasticated cookie. Oropharyngeal phase is characterized by normal strength and only trace flash penetration of thin liquids. Pt did not aspirate during the study, thus laryngeal sensation is undetermined. Recommend conservative diet of Dys 2 (ground) with nectar thick liquids to allow for SLP observation during meal to ensure tolerance and upgrade to thin if arousal is stable. Family in agreement. CHL IP TREATMENT RECOMMENDATION 05/31/2014 Treatment Recommendations Therapy as outlined in treatment plan below CHL IP DIET RECOMMENDATION 05/31/2014 SLP Diet Recommendations Dysphagia 2 (Fine chop);Nectar Liquid Administration via (None) Medication Administration Crushed with puree Compensations Slow rate;Small sips/bites;Check for pocketing;Check for anterior loss Postural Changes  and/or Swallow Maneuvers (None) CHL IP OTHER RECOMMENDATIONS 05/31/2014 Recommended Consults (None) Oral Care Recommendations Oral care BID Other Recommendations Order thickener from pharmacy;Have oral suction available CHL IP FOLLOW UP RECOMMENDATIONS 05/31/2014 Follow up  Recommendations Inpatient Rehab CHL IP FREQUENCY AND DURATION 05/31/2014 Speech Therapy Frequency (ACUTE ONLY) (None) Treatment Duration 2 weeks Pertinent Vitals/Pain NA SLP Swallow Goals No flowsheet data found. No flowsheet data found. CHL IP REASON FOR REFERRAL 05/31/2014 Reason for Referral Objectively evaluate swallowing function CHL IP ORAL PHASE 05/31/2014 Lips (None) Tongue (None) Mucous membranes (None) Nutritional status (None) Other (None) Oxygen therapy (None) Oral Phase Impaired Oral - Pudding Teaspoon (None) Oral - Pudding Cup (None) Oral - Honey Teaspoon (None) Oral - Honey Cup (None) Oral - Honey Syringe (None) Oral - Nectar Teaspoon (None) Oral - Nectar Cup (None) Oral - Nectar Straw (None) Oral - Nectar Syringe (None) Oral - Ice Chips (None) Oral - Thin Teaspoon (None) Oral - Thin Cup (None) Oral - Thin Straw (None) Oral - Thin Syringe (None) Oral - Puree (None) Oral - Mechanical Soft (None) Oral - Regular (None) Oral - Multi-consistency (None) Oral - Pill (None) Oral Phase - Comment (None) CHL IP PHARYNGEAL PHASE 05/31/2014 Pharyngeal Phase Impaired Pharyngeal - Pudding Teaspoon (None) Penetration/Aspiration details (pudding teaspoon) (None) Pharyngeal - Pudding Cup (None) Penetration/Aspiration details (pudding cup) (None) Pharyngeal - Honey Teaspoon (None) Penetration/Aspiration details (honey teaspoon) (None) Pharyngeal - Honey Cup (None) Penetration/Aspiration details (honey cup) (None) Pharyngeal - Honey Syringe (None) Penetration/Aspiration details (honey syringe) (None) Pharyngeal - Nectar Teaspoon (None) Penetration/Aspiration details (nectar teaspoon)  (None) Pharyngeal - Nectar Cup (None) Penetration/Aspiration details (nectar cup) (None) Pharyngeal - Nectar Straw (None) Penetration/Aspiration details (nectar straw) (None) Pharyngeal - Nectar Syringe (None) Penetration/Aspiration details (nectar syringe) (None) Pharyngeal - Ice Chips (None) Penetration/Aspiration details (ice chips) (None) Pharyngeal - Thin Teaspoon (None) Penetration/Aspiration details (thin teaspoon) (None) Pharyngeal - Thin Cup (None) Penetration/Aspiration details (thin cup) (None) Pharyngeal - Thin Straw (None) Penetration/Aspiration details (thin straw) (None) Pharyngeal - Thin Syringe (None) Penetration/Aspiration details (thin syringe') (None) Pharyngeal - Puree (None) Penetration/Aspiration details (puree) (None) Pharyngeal - Mechanical Soft (None) Penetration/Aspiration details (mechanical soft) (None) Pharyngeal - Regular (None) Penetration/Aspiration details (regular) (None) Pharyngeal - Multi-consistency (None) Penetration/Aspiration details (multi-consistency) (None) Pharyngeal - Pill (None) Penetration/Aspiration details (pill) (None) Pharyngeal Comment (None) No flowsheet data found. No flowsheet data found. Harlon Ditty, [place] CCC-SLP [phone] Claudine Mouton 05/31/2014, 12:19 PM     Assessment/Plan: Diagnosis: ICH with dense right HP 1. Does the need for close, 24 hr/day medical supervision in concert with the patient's rehab needs make it unreasonable for this patient to be served in a less intensive setting? Yes 2. Co-Morbidities requiring supervision/potential complications: htn, 3. Due to bladder management, bowel management, safety, skin/wound care, disease management, medication administration, pain management and patient education, does the patient require 24 hr/day rehab nursing? Yes 4. Does the patient require coordinated care of a physician, rehab nurse, PT (1-2 hrs/day, 5 days/week), OT (1-2 hrs/day, 5  days/week) and SLP (1-2 hrs/day, 5 days/week) to address physical and functional deficits in the context of the above medical diagnosis(es)? Yes Addressing deficits in the following areas: balance, endurance, locomotion, strength, transferring, bowel/bladder control, bathing, dressing, feeding, grooming, toileting, cognition, speech, language, swallowing and psychosocial support 5. Can the patient actively participate in an intensive therapy program of at least 3 hrs of therapy per day at least 5 days per week? Potentially 6. The potential for patient to make measurable gains while on inpatient rehab is good 7. Anticipated functional outcomes upon discharge from inpatient rehab are min assist and mod assist with PT, min assist and mod assist with OT, supervision, min assist and mod assist with SLP. 8. Estimated  rehab length of stay to reach the above functional goals is: 20-30 days 9. Does the patient have adequate social supports and living environment to accommodate these discharge functional goals? Yes 10. Anticipated D/C setting: Home 11. Anticipated post D/C treatments: HH therapy and Outpatient therapy 12. Overall Rehab/Functional Prognosis: excellent  RECOMMENDATIONS: This patient's condition is appropriate for continued rehabilitative care in the following setting: CIR Patient has agreed to participate in recommended program. Yes Note that insurance prior authorization may be required for reimbursement for recommended care.  Comment: Rehab Admissions Coordinator to follow up.  Thanks,  Ranelle Oyster, MD, [facility]     06/01/2014       Revision History     Date/Time User Provider Type Action   06/01/2014 9:47 AM Ranelle Oyster, MD Physician Sign   06/01/2014 6:19 AM Charlton Amor, PA-C Physician Assistant Pend   View Details Report       Routing History     Date/Time From To Method   06/01/2014 9:47 AM Ranelle Oyster, MD Ranelle Oyster, MD In Basket

## 2014-06-04 NOTE — Plan of Care (Signed)
Problem: RH BLADDER ELIMINATION Goal: RH STG MANAGE BLADDER WITH ASSISTANCE STG Manage Bladder With mod Assistance  Outcome: Not Progressing Total assist for now

## 2014-06-04 NOTE — Evaluation (Signed)
Occupational Therapy Assessment and Plan  Patient Details  Name: Carolyn [name] MRN: [id number] Date of Birth: 1945-12-01  OT Diagnosis: abnormal posture, apraxia, cognitive deficits, disturbance of vision, flaccid hemiplegia and hemiparesis, hemiplegia affecting dominant side and muscle weakness (generalized) Rehab Potential: Rehab Potential (ACUTE ONLY): Good ELOS: 26-28 days   Today's Date: 06/04/2014 OT Individual Time: 0900-1002 OT Individual Time Calculation (min): 62 min     Problem List:  Patient Active Problem List   Diagnosis Date Noted  . Embolic stroke   . HLD (hyperlipidemia)   . Aphasia   . Right hemiplegia   . Endotracheal tube present   . Stroke   . 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 Acute respiratory failure, unspecified whether with hypoxia or hypercapnia   . Acute respiratory failure   . Bradycardia 05/23/2014  . Cerebral brain hemorrhage   . Respiratory failure   . Junctional bradycardia   . Arterial hypotension   . ICH (intracerebral hemorrhage) 05/20/2014  . Hypertensive emergency 05/20/2014    Past Medical History:  Past Medical History  Diagnosis Date  . Hypertension   . AAA (abdominal aortic aneurysm)     a. s/p stent grafting @ [facility].  . Hemorrhagic cerebrovascular accident (CVA)     a. 05/20/2014 L hemispheric hemorrhagic CVA s/p decompressive craniotomy and evacuation.  . H/O echocardiogram     a. 04/2014 Echo: EF 65-70%, Gr 1 DD, mild LVH.   Past Surgical History:  Past Surgical History  Procedure Laterality Date  . Abdominal aortic aneurysm repair    . Craniotomy Left 05/20/2014    Procedure: CRANIOTOMY HEMATOMA EVACUATION;  Surgeon: Karie Chimera, MD;  Location: [place] [facility];  Service: Neurosurgery;  Laterality: Left;    Assessment & Plan Clinical Impression:  Carolyn [name] is a 69 y.o. right handed female with history of hypertension, recent status post AAA stent grafting at [facility] and was  maintained on aspirin and Plavix.. By report patient lived alone prior to admission working as an Web designer. Admitted 05/20/2014 after after developing severe headache right arm weakness while at work and suddenly became unresponsive. Blood pressure 241/123. Cranial CT scan showed left ICH with 3 mm midline shift. Patient was intubated for airway protection. Underwent left frontoparietal craniotomy for evacuation of intracerebral hematoma 05/20/2014 per Carolyn [name]. Nasogastric tube placed for nutritional support. Echocardiogram with ejection fraction of 10% grade 1 diastolic dysfunction. Carotid Dopplers with left 40-59% ICA stenosis. Maintained on Keppra for seizure prophylaxis. EEG showed no seizure activity. Cardiology consulted 05/23/2014 for ectopic atrial bradycardia identified per EKG no acute ST-T changes felt likely vagally mediated from CNS event and monitored with no further workup needed. Latest follow-up cranial CT scan MRI shows stable appearance of residual left frontal parietal hemorrhage following craniotomy there was areas of acute subacute nonhemorrhagic infarct within the occipital lobes bilaterally, posterior left temporal lobe, and left globus pallidus. MR angiogram of head and neck showed 60% stenosis of the proximal left ICA artery. Moderate to high-grade stenosis of the proximal right vertebral artery without significant distal stenosis. Small subarachnoid hemorrhage over the right temporal lobe. Neurology following recently placed on aspirin 81 mg daily 05/27/2014 as well as initiation of subcutaneous heparin for DVT prophylaxis. Patient was extubated 05/30/2014. Swallow study per speech therapy maintained on a dysphagia #2 nectar thick liquid diet. Contact precautions for MRSA PCR screening. Physical therapy evaluation 05/31/2014 with recommendations of physical medicine rehabilitation consult Patient transferred to CIR on 06/03/2014 .  Patient currently requires total  with basic self-care skills secondary to muscle weakness, decreased cardiorespiratoy endurance, abnormal tone and motor apraxia, decreased visual perceptual skills, decreased visual motor skills and field cut, decreased midline orientation and right side neglect, decreased initiation and decreased attention and decreased sitting balance and hemiplegia.  Prior to hospitalization, patient was fully independent, living alone, working full time as an Web designer.  Patient will benefit from skilled intervention to increase independence with basic self-care skills prior to discharge home with care partner.  Anticipate patient will require 24 hour supervision and minimal physical assistance and follow up home health.  OT - End of Session Activity Tolerance: Tolerates 10 - 20 min activity with multiple rests OT Assessment Rehab Potential (ACUTE ONLY): Good Barriers to Discharge: Decreased caregiver support Barriers to Discharge Comments: Family plans to hire 24hr assistance for home OT Patient demonstrates impairments in the following area(s): Balance;Cognition;Endurance;Motor;Perception;Safety;Sensory;Vision OT Basic ADL's Functional Problem(s): Eating;Grooming;Bathing;Dressing;Toileting OT Transfers Functional Problem(s): Toilet;Tub/Shower OT Additional Impairment(s): Fuctional Use of Upper Extremity OT Plan OT Intensity: Minimum of 1-2 x/day, 45 to 90 minutes OT Frequency: 5 out of 7 days OT Duration/Estimated Length of Stay: 26-28 days OT Treatment/Interventions: Balance/vestibular training;Cognitive remediation/compensation;Discharge planning;DME/adaptive equipment instruction;Functional mobility training;Neuromuscular re-education;Patient/family education;Psychosocial support;Self Care/advanced ADL retraining;Therapeutic Activities;Therapeutic Exercise;Visual/perceptual remediation/compensation OT Self Feeding Anticipated Outcome(s): Supervision OT Basic Self-Care Anticipated  Outcome(s): min A OT Toileting Anticipated Outcome(s): min A OT Bathroom Transfers Anticipated Outcome(s): min A OT Recommendation Patient destination: Home Follow Up Recommendations: Home health OT;24 hour supervision/assistance Equipment Recommended: 3 in 1 bedside comode;Tub/shower bench   Skilled Therapeutic Intervention Pt seen for initial evaluation and BADLs with transfers. For the first 40 min of session, no family present. Pt was able to follow the majority of very simple one step commands with extra time, but did require hand over hand guiding to guide L side of body during functional mobility such as rolling or stabilizing self in sitting.  Bathing performed bed level with pt washing part of UB.  Daughter arrived with clothing. Pants donned from bed with +2 A. Rehab tech arrived to A with a +2 sidelying to sit (pt did assist by pushing up with L arm) and slide board to chair with total A. Quick release belt and lap tray applied. Brief education with her son and daughter in regards to PROM to RUE, standing on her R side, encouraging her to attend to her R side.   OT Evaluation Precautions/Restrictions  Precautions Precautions: Fall Restrictions Weight Bearing Restrictions: No  Pain Pain Assessment Pain Assessment: Faces Faces Pain Scale: No hurt Home Living/Prior Functioning Home Living Available Help at Discharge: Family, Available 24 hours/day Type of Home: House Home Access: Stairs to enter [employer] of Steps: threshold  Home Layout: One level Additional Comments: Pt lived alone.  Her family is investigating options to provide 24 hour assist   Lives With: Alone Prior Function Level of Independence: Independent with basic ADLs, Independent with homemaking with ambulation, Independent with gait Vocation: Full time employment Comments: Pt was fully independent.  She worked full time as an Web designer  ADL ADL ADL Comments: Refer to  [employer] Vision/Perception  Vision- History Baseline Vision/History: Wears glasses Wears Glasses: At all times Patient Visual Report: Other (comment) (pt unable to report) Vision- Assessment Vision Assessment?: Yes;Vision impaired- to be further tested in functional context Eye Alignment: Within Functional Limits Ocular Range of Motion: Within Functional Limits Alignment/Gaze Preference: Gaze left Tracking/Visual Pursuits: Impaired - to be further tested in functional  context Saccades: Impaired - to be further tested in functional context Convergence: Impaired - to be further tested in functional context Visual Fields: Right visual field deficit;Impaired-to be further tested in functional context Diplopia Assessment: Other (comment) (pt frequently closes L eye, to be evaluated further)  Cognition Overall Cognitive Status: Impaired/Different from baseline Arousal/Alertness: Awake/alert Orientation Level: Other (comment) (global aphasia unable to assess) Attention: Sustained Sustained Attention: Impaired Sustained Attention Impairment: Verbal basic;Functional basic Sensation Sensation Light Touch: Impaired by gross assessment (RUE/RLE) Stereognosis: Not tested Hot/Cold: Not tested Proprioception: Impaired by gross assessment (RUE/RLE) Coordination Gross Motor Movements are Fluid and Coordinated: No Fine Motor Movements are Fluid and Coordinated: No Coordination and Movement Description: RUE/RLE flaccid Motor  Motor Motor: Hemiplegia;Motor apraxia Motor - Skilled Clinical Observations: Pt required hand over hand guiding for placement on LUE/ LLE with bed mobility, sitting balance, slide board transfers Mobility  Total A x2 slide board transfers   Trunk/Postural Assessment  Cervical Assessment Cervical Assessment: Within Functional Limits Thoracic Assessment Thoracic Assessment: Exceptions to [facility] (kyphotic posture) Lumbar Assessment Lumbar Assessment: Exceptions to [facility] (posterior  pelvic tilt) Postural Control Postural Control: Deficits on evaluation Trunk Control: in sitting, pushes to R and posterior Righting Reactions: when pt falls back, unable to self correct  Balance Static Sitting Balance Static Sitting - Level of Assistance: 2: Max assist Dynamic Sitting Balance Dynamic Sitting - Level of Assistance: 1: +2 Total assist Sitting balance - Comments: Pushing with LUE in sitting, improved with L hand on lap Static Standing Balance Static Standing - Level of Assistance: Not tested (comment) Extremity/Trunk Assessment RUE Assessment RUE Assessment: Exceptions to [facility] RUE PROM (degrees) Overall PROM Right Upper Extremity: Within functional limits for tasks performed RUE Tone RUE Tone: Flaccid LUE Assessment LUE Assessment: Within Functional Limits  FIM:  FIM - Eating Eating Activity: 3: Helper scoops food on utensil every scoop FIM - Grooming Grooming Steps: Wash, rinse, dry face Grooming: 2: Patient completes 1 of 4 or 2 of 5 steps FIM - Bathing Bathing Steps Patient Completed: Chest;Abdomen Bathing: 1: Total-Patient completes 0-2 of 10 parts or less than 25% FIM - Upper Body Dressing/Undressing Upper body dressing/undressing: 0: Wears gown/pajamas-no public clothing FIM - Lower Body Dressing/Undressing Lower body dressing/undressing: 1: Two helpers FIM - Toileting Toileting: 0: Activity did not occur FIM - Control and instrumentation engineer Devices: Sliding board Bed/Chair Transfer: 1: Two helpers;1: Bed > Chair or W/C: Total A (helper does all/Pt. < 25%);2: Supine > Sit: Max A (lifting assist/Pt. 25-49%) FIM - Air cabin crew Transfers: 0-Activity did not occur FIM - Tub/Shower Transfers Tub/shower Transfers: 0-Activity did not occur or was simulated   Refer to Care Plan for Long Term Goals  Recommendations for other services: Neuropsych  Discharge Criteria: Patient will be discharged from OT if patient refuses treatment  3 consecutive times without medical reason, if treatment goals not met, if there is a change in medical status, if patient makes no progress towards goals or if patient is discharged from hospital.  The above assessment, treatment plan, treatment alternatives and goals were discussed and mutually agreed upon: by patient and by family  [place] 06/04/2014, 12:20 PM

## 2014-06-05 ENCOUNTER — Inpatient Hospital Stay (HOSPITAL_COMMUNITY): Payer: BLUE CROSS/BLUE SHIELD | Admitting: Occupational Therapy

## 2014-06-05 ENCOUNTER — Inpatient Hospital Stay (HOSPITAL_COMMUNITY): Payer: BLUE CROSS/BLUE SHIELD | Admitting: Physical Therapy

## 2014-06-05 ENCOUNTER — Inpatient Hospital Stay (HOSPITAL_COMMUNITY): Payer: BLUE CROSS/BLUE SHIELD | Admitting: *Deleted

## 2014-06-05 ENCOUNTER — Inpatient Hospital Stay (HOSPITAL_COMMUNITY): Payer: BLUE CROSS/BLUE SHIELD | Admitting: Speech Pathology

## 2014-06-05 MED ORDER — PANTOPRAZOLE SODIUM 40 MG PO PACK
40.0000 mg | PACK | Freq: Every day | ORAL | Status: DC
  Administered 2014-06-05 – 2014-06-21 (×17): 40 mg via ORAL
  Filled 2014-06-05 (×19): qty 20

## 2014-06-05 MED ORDER — METOPROLOL TARTRATE 25 MG/10 ML ORAL SUSPENSION
12.5000 mg | Freq: Two times a day (BID) | ORAL | Status: DC
  Administered 2014-06-05 – 2014-06-21 (×33): 12.5 mg via ORAL
  Filled 2014-06-05 (×36): qty 5

## 2014-06-05 NOTE — Progress Notes (Signed)
Speech Language Pathology Daily Session Note  Patient Details  Name: Carolyn [name] MRN: [id number] Date of Birth: 11-Dec-1945  Today's Date: 06/05/2014 SLP Individual Time: 1500-1530 SLP Individual Time Calculation (min): 30 min  Short Term Goals: Week 1: SLP Short Term Goal 1 (Week 1): Patient will consume current diet without overt s/s of aspiration with Mod A verbal cues for use of swallow strategies.  SLP Short Term Goal 2 (Week 1): Patient will scan to midline with Mod A multimodal cues during functional and familiar tasks. SLP Short Term Goal 3 (Week 1): Patient will demonstrate sustained attention to functional and familiar tasks for 5 minutes with Mod A verbal cues for redirection.  SLP Short Term Goal 4 (Week 1): Patient will identify functional items from a field of 2 with 75% accuracy with Mod A multimodal cues.  SLP Short Term Goal 5 (Week 1): Patient will name functional items with Max A multimodal cues with 25% accuracy.  SLP Short Term Goal 6 (Week 1): Patient will phonate on command with Max A multimodal cues in 25% of opportunities .   Skilled Therapeutic Interventions: Skilled treatment session focused on speech and language goals. Upon arrival, patient was awake while supine in bed with visitors present. Patient repositioned in bed to maximize arousal and alertness. Patient initially answered basic yes/no questions appropriately with head nods in regards to identifying family members but identified functional items from a field of 2 with 50% accuracy. Patient able to mouth words to 50% of "Happy Birthday" but was unable to perform any other automatic task despite Max A multimodal cues. Patient aphonic this session and was unable to voice despite Max A multimodal cues, however, patient able to vocal "hmmm" automatically when clinician asked if she needed anything else prior to her leaving. Patient became labile towards end of session. Family educated on patient's current function  and goals of skilled SLP intervention. Patient left supine in bed with alarm on and all needs within reach. Continue with current plan of care.    FIM:  Comprehension Comprehension Mode: Auditory Comprehension: 2-Understands basic 25 - 49% of the time/requires cueing 51 - 75% of the time Expression Expression Mode: Nonverbal Expression: 1-Expresses basis less than 25% of the time/requires cueing greater than 75% of the time. Social Interaction Social Interaction: 1-Interacts appropriately less than 25% of the time. May be withdrawn or combative. Problem Solving Problem Solving: 1-Solves basic less than 25% of the time - needs direction nearly all the time or does not effectively solve problems and may need a restraint for safety Memory Memory:  (can't assess-global aphasia) FIM - Eating Eating Activity: 5: Supervision/cues  Pain Pain Assessment Pain Assessment: No/denies pain  Therapy/Group: Individual Therapy  Elbert Spickler 06/05/2014, 4:14 PM

## 2014-06-05 NOTE — Progress Notes (Signed)
Physical Therapy Session Note  Patient Details  Name: Carolyn [name] MRN: [id number] Date of Birth: 06/12/1945   Today's Date: 06/05/2014 PT Individual Time: 0755-0855 PT Individual Time Calculation (min): 60 min    Skilled Therapeutic Interventions/Progress Updates:  Patient in bed, agrees to therapy and does not complain of any pain. Max A for rolling R and L to change and toileting and donning pants.Coming to sit with max A of 2. Poor balance in sitting EOB, max A for upper body dressing. Transfer to [name]/c squat pivot max A of 2, TIS [name]/c. Training and instructions in transfer to mat with sliding board-max A of 2 ,patient has difficulty initiating transfer. Training in sitting balance on mat with max A due to strong R side leaning. Activities in sitting to facilitate trunk strength and balance in sitting. Patient reached for cones on R and L with max multimodal cues and manual facilitation of maintain sitting and return to midline. Sit to stand x3 with max A of 2,and manual assistance of weight shift applied on R IT and manual locking of R knee in standing. Patient returned to TIS [name]/c via sliding board with max A, left at the nurses station with QR belt on.  Therapy Documentation Precautions:  Precautions Precautions: Fall Precaution Comments: R field cut/R inattention, pushes to R  Restrictions Weight Bearing Restrictions: No  See FIM for current functional status  Therapy/Group: Individual Therapy  Dorna MaiCzajkowska, Carolyn [name] 06/05/2014, 8:51 AM

## 2014-06-05 NOTE — Progress Notes (Signed)
Physical Therapy Session Note  Patient Details  Name: Carolyn [name] MRN: [id number] Date of Birth: [birth year]  Today's Date: 06/05/2014 PT Individual Time: 1300-1330 PT Individual Time Calculation (min): 30 min   Short Term Goals: Week 1:  PT Short Term Goal 1 (Week 1): Patient will perform bed mobility with max A x 1.  PT Short Term Goal 2 (Week 1): Patient will perform bed <> wheelchair transfer with mod A x 2.  PT Short Term Goal 3 (Week 1): Patient will ambulate 10 ft with +2 assist and wheelchair follow.  PT Short Term Goal 4 (Week 1): Patient will initiate stair training.  PT Short Term Goal 5 (Week 1): Patient will perform dynamic standing balance x 2 min with +2 assist.   Skilled Therapeutic Interventions/Progress Updates:  Pt was seen bedside in the pm. Pt transferred supine to edge of bed with bed rail, head of bed elevated and max to total A and verbal cues. Pt tolerated edge of bed x 15 minutes with min guard to min A and L UE support. While on edge of bed, pt ate apple sauce with full assist. Pt also performed LAQs, AROM on L LE and PROM R LE. Pt transferred edge of bed to supine with max to total A and verbal cues. Pt rolled into L sidelying with max A and verbal cues. Pt moved up in bed total A. Pt left sitting with head of bed elevated and call bell within reach.   Therapy Documentation Precautions:  Precautions Precautions: Fall Precaution Comments: R field cut/R inattention, pushes to R  Restrictions Weight Bearing Restrictions: No General:   Pain: No c/o pain.   See FIM for current functional status  Therapy/Group: Individual Therapy  Rayford HalstedMitchell, Latroya Ng G 06/05/2014, 3:01 PM

## 2014-06-05 NOTE — Progress Notes (Signed)
[facility] PHYSICAL MEDICINE & REHABILITATION     PROGRESS NOTE    Subjective/Complaints: No problems over night.  ROS limited due to severe aphasia  Objective: Vital Signs: Blood pressure 133/52, pulse 76, temperature 97.9 F (36.6 C), temperature source Oral, resp. rate 18, weight 87.5 kg (192 lb 14.4 oz), SpO2 100 %. No results found.  Recent Labs  06/03/14 0414 06/04/14 0540  WBC 13.7* 13.5*  HGB 11.2* 11.4*  HCT 36.3 36.3  PLT 334 331    Recent Labs  06/03/14 0414 06/04/14 0540  NA 139 138  K 3.7 3.6  CL 104 102  GLUCOSE 96 96  BUN 13 12  CREATININE 0.60 0.62  CALCIUM 9.1 9.2   CBG (last 3)  No results for input(s): GLUCAP in the last 72 hours.  Wt Readings from Last 3 Encounters:  06/03/14 87.5 kg (192 lb 14.4 oz)  06/03/14 90.4 kg (199 lb 4.7 oz)    Physical Exam:  HENT:  Craniotomy site clean and dry  Eyes:  Pupils reactive to light without nystagmus  Neck: Normal range of motion. Neck supple. No thyromegaly present.  Cardiovascular:  Cardiac rate control  Respiratory:  Poor inspiratory effort but clear to auscultation  GI: Soft. Bowel sounds are normal. She exhibits no distension.  Neurological:  Patient alert.  Follows commands 25% or less, expressive greater than receptive aphasia. 0/5 RUE and RLE. Spontaneous movement of left arm and leg.. Right HH. Pinch on RUE and RLE elicits withdrawl Psychiatric:  flat   Assessment/Plan: 1. 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 Physiatrist is providing close team supervision and 24 hour management of active medical problems listed below. Physiatrist and rehab team continue to assess barriers to discharge/monitor patient progress toward functional and medical goals. FIM: FIM - Bathing Bathing Steps Patient Completed: Chest, Abdomen Bathing: 1: Total-Patient  completes 0-2 of 10 parts or less than 25%  FIM - Upper Body Dressing/Undressing Upper body dressing/undressing: 0: Wears gown/pajamas-no public clothing FIM - Lower Body Dressing/Undressing Lower body dressing/undressing: 1: Two helpers  FIM - Toileting Toileting: 0: Activity did not occur  FIM - ArchivistToilet Transfers Toilet Transfers: 0-Activity did not occur  FIM - BankerBed/Chair Transfer Bed/Chair Transfer Assistive Devices: Arm rests Bed/Chair Transfer: 1: Two helpers  FIM - Locomotion: Wheelchair Distance: 5 ft Locomotion: Wheelchair: 1: Total Assistance/staff pushes wheelchair (Pt<25%) FIM - Locomotion: Ambulation Locomotion: Ambulation Assistive Devices: Other (comment) (3 musketeers) Ambulation/Gait Assistance: 1: +2 Total assist Locomotion: Ambulation: 1: Two helpers  Comprehension Comprehension Mode: Auditory Comprehension: 2-Understands basic 25 - 49% of the time/requires cueing 51 - 75% of the time  Expression Expression Mode: Verbal Expression: 1-Expresses basis less than 25% of the time/requires cueing greater than 75% of the time.  Social Interaction Social Interaction: 2-Interacts appropriately 25 - 49% of time - Needs frequent redirection.  Problem Solving Problem Solving: 1-Solves basic less than 25% of the time - needs direction nearly all the time or does not effectively solve problems and may need a restraint for safety  Memory Memory: 1-Recognizes or recalls less than 25% of the time/requires cueing greater than 75% of the time  Medical Problem List and Plan: 1. 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 DVT Prophylaxis/Anticoagulation: Subcutaneous heparin initiated 05/24/2014. Monitor for any bleeding episodes 3. Pain Management: Tylenol as needed 4. Seizure prophylaxis. Keppra 500 mg twice a day.  EEG negative 5. Neuropsych: This patient is not capable of making decisions on her own behalf. 6.  Skin/Wound Care: Routine skin checks 7. Fluids/Electrolytes/Nutrition: Follow i's and o's, po intake, check labs.  8. Dysphagia. Dysphagia #2 nectar liquids. Monitor hydration follow-up speech therapy 9. MRSA PCR screening positive. Contact precautions 10. Hypertension. Toprol-XL 25 mg daily. Monitor with increased mobility 11. Hypothyroidism. Synthroid LOS (Days) 2 A FACE TO FACE EVALUATION WAS PERFORMED  SWARTZ,ZACHARY T 06/05/2014 8:00 AM

## 2014-06-05 NOTE — Progress Notes (Signed)
Occupational Therapy Session Note  Patient Details  Name: [name] CourtGeraldine Scalese MRN: [id number] Date of Birth: 08-28-1945  Today's Date: 06/05/2014 OT Individual Time: 1000-1130 OT Individual Time Calculation (min): 90 min    Short Term Goals: Week 1:  OT Short Term Goal 1 (Week 1): Pt will be able to sit to EOB with mod A. OT Short Term Goal 2 (Week 1): Pt will maintain static sitting EOB with mod A. OT Short Term Goal 3 (Week 1): Pt will demonstrate increased awareness of RUE to wash it with min A. OT Short Term Goal 4 (Week 1): Pt will don shirt with max A. OT Short Term Goal 5 (Week 1): Pt will roll in bed with mod A to assist caregivers with changing her LB clothing.  Skilled Therapeutic Interventions/Progress Updates:    Pt received in w/c sitting at nursing station. Pt had just been dressed earlier in am by PT and rehab tech to prepare for PT session. Pt taken to gym and was positioned in an upright position at table to facilitate R side attention to RUE, visual tracking, and facilitation of RUE. Pt's R arm placed on table for PROM exercises, attempted to have pt use LUE to move R but she continually placed L hand on arm rest pushing her to the R. Significant apraxia with these basic movements.  Used vibration to stimulate R arm but no response and pt was very passive to the stimulation.  Attempted fastening clothespins, but pt not focused and squirming in her chair. Could not hold clothespins in L hand with smooth coordination. Pt was becoming more lethargic.  After 30 min,pt asked if she needed to get back to bed, nodded yes.  Rehab tech assisted this therapist with a slide board w/c to bed to her L with total A x2. Pt was not able to assist with L leg or arm. Sat at EOB for 5 minutes with max A to achieve midline. L hand on lap to avoid pushing to the R. Pt adjusted into bed in a chair position so more upright. Pt  Became more alert at this point. Worked on BUE integration with hands clasped  for RUE PROM with max A to lift arms even with L arm assisting due to apraxia.   Pt engaged in simple grooming, hand over hand guidance to use toothbrush on gums (dentures removed and washed and then replaced), combing hair. Pt did wash face.   Pt repositioned into supine to facilitate hip/knee mobility for rolling and initiation of LLE movements. Again demonstrated very impaired praxis with movement.  Pt was adjusted in bed with head elevated, R arm elevated on pillows.  Improved visual tracking to the R and attention to environment on R. Call light pad placed on pt within reach.  Therapy Documentation Precautions:  Precautions Precautions: Fall Precaution Comments: R field cut/R inattention, pushes to R  Restrictions Weight Bearing Restrictions: No    Vital Signs: Therapy Vitals Temp: 97.9 F (36.6 C) Temp Source: Oral Pulse Rate: 80 Resp: 20 BP: (!) 153/63 mmHg Patient Position (if appropriate): Lying Oxygen Therapy SpO2: 100 % Pain: Pain Assessment Pain Assessment: No/denies pain ADL: ADL ADL Comments: Refer to FIM  See FIM for current functional status  Therapy/Group: Individual Therapy  Ambyr Qadri 06/05/2014, 4:54 PM

## 2014-06-06 ENCOUNTER — Inpatient Hospital Stay (HOSPITAL_COMMUNITY): Payer: BLUE CROSS/BLUE SHIELD | Admitting: Occupational Therapy

## 2014-06-06 ENCOUNTER — Inpatient Hospital Stay (HOSPITAL_COMMUNITY): Payer: BLUE CROSS/BLUE SHIELD

## 2014-06-06 NOTE — Progress Notes (Signed)
[facility] PHYSICAL MEDICINE & REHABILITATION     PROGRESS NOTE    Subjective/Complaints: Had a good night. RN says she used the word "bathroom" to let them know she needed to empty ROS limited due to severe aphasia  Objective: Vital Signs: Blood pressure 135/50, pulse 83, temperature 98.1 F (36.7 C), temperature source Oral, resp. rate 18, weight 87.5 kg (192 lb 14.4 oz), SpO2 97 %. No results found.  Recent Labs  06/04/14 0540  WBC 13.5*  HGB 11.4*  HCT 36.3  PLT 331    Recent Labs  06/04/14 0540  NA 138  K 3.6  CL 102  GLUCOSE 96  BUN 12  CREATININE 0.62  CALCIUM 9.2   CBG (last 3)  No results for input(s): GLUCAP in the last 72 hours.  Wt Readings from Last 3 Encounters:  06/03/14 87.5 kg (192 lb 14.4 oz)  06/03/14 90.4 kg (199 lb 4.7 oz)    Physical Exam:  HENT:  Craniotomy site clean and dry  Eyes:  Pupils reactive to light without nystagmus  Neck: Normal range of motion. Neck supple. No thyromegaly present.  Cardiovascular:  Cardiac rate control  Respiratory:  Poor inspiratory effort but clear to auscultation  GI: Soft. Bowel sounds are normal. She exhibits no distension.  Neurological:  Patient alert.  Follows basic commands.  expressive greater than receptive aphasia. 0/5 RUE and RLE.  early flexor tone in RUE. Spontaneous movement of left arm and leg.. Right HH. Pinch on RUE and RLE elicits withdrawl Psychiatric:  flat   Assessment/Plan: 1. 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 Physiatrist is providing close team supervision and 24 hour management of active medical problems listed below. Physiatrist and rehab team continue to assess barriers to discharge/monitor patient progress toward functional and medical goals. FIM: FIM - Bathing Bathing Steps Patient Completed: Chest, Abdomen Bathing: 1:  Total-Patient completes 0-2 of 10 parts or less than 25%  FIM - Upper Body Dressing/Undressing Upper body dressing/undressing: 0: Wears gown/pajamas-no public clothing FIM - Lower Body Dressing/Undressing Lower body dressing/undressing: 1: Two helpers  FIM - Toileting Toileting: 0: Activity did not occur  FIM - ArchivistToilet Transfers Toilet Transfers: 0-Activity did not occur  FIM - BankerBed/Chair Transfer Bed/Chair Transfer Assistive Devices: HOB elevated, Bed rails Bed/Chair Transfer: 2: Sit > Supine: Max A (lifting assist/Pt. 25-49%), 1: Sit > Supine: Total A (helper does all/Pt. < 25%), 1: Supine > Sit: Total A (helper does all/Pt. < 25%), 2: Supine > Sit: Max A (lifting assist/Pt. 25-49%)  FIM - Locomotion: Wheelchair Distance: 5 ft Locomotion: Wheelchair: 1: Total Assistance/staff pushes wheelchair (Pt<25%) FIM - Locomotion: Ambulation Locomotion: Ambulation Assistive Devices: Other (comment) (3 musketeers) Ambulation/Gait Assistance: 1: +2 Total assist Locomotion: Ambulation: 1: Two helpers  Comprehension Comprehension Mode: Auditory Comprehension: 2-Understands basic 25 - 49% of the time/requires cueing 51 - 75% of the time  Expression Expression Mode: Nonverbal Expression: 1-Expresses basis less than 25% of the time/requires cueing greater than 75% of the time.  Social Interaction Social Interaction: 1-Interacts appropriately less than 25% of the time. May be withdrawn or combative.  Problem Solving Problem Solving: 1-Solves basic less than 25% of the time - needs direction nearly all the time or does not effectively solve problems and may need a restraint for safety  Memory Memory:  (can't assess-global aphasia)  Medical Problem List and Plan: 1. Functional deficits secondary to nontraumatic  ICH status post left frontoparietal craniotomy 2. DVT Prophylaxis/Anticoagulation: Subcutaneous heparin initiated 05/24/2014. Monitor for any bleeding episodes 3. Pain Management:  Tylenol as needed 4. Seizure prophylaxis. Keppra 500 mg twice a day. EEG negative 5. Neuropsych: This patient is not capable of making decisions on her own behalf. 6. Skin/Wound Care: Routine skin checks 7. Fluids/Electrolytes/Nutrition: Follow i's and o's, po intake, check labs.  8. Dysphagia. Dysphagia #2 nectar liquids. Monitor hydration follow-up speech therapy 9. MRSA PCR screening positive. Contact precautions 10. Hypertension. Toprol-XL 25 mg daily. Had one elevated pressure yesterday (?valid)---otherwise bp's have been good---observe only for now 11. Hypothyroidism. Synthroid   LOS (Days) 3 A FACE TO FACE EVALUATION WAS PERFORMED  SWARTZ,ZACHARY T 06/06/2014 7:19 AM

## 2014-06-06 NOTE — Progress Notes (Signed)
Physical Therapy Session Note  Patient Details  Name: Carolyn [name] MRN: [id number] Date of Birth: 1945-05-26  Today's Date: 06/06/2014 PT Individual Time: 1100-1130 PT Individual Time Calculation (min): 30 min  and Today's Date: 06/06/2014 PT Missed Time: 30 Minutes Missed Time Reason: Patient fatigue;Bowel/bladder accident  Short Term Goals: Week 1:  PT Short Term Goal 1 (Week 1): Patient will perform bed mobility with max A x 1.  PT Short Term Goal 2 (Week 1): Patient will perform bed <> wheelchair transfer with mod A x 2.  PT Short Term Goal 3 (Week 1): Patient will ambulate 10 ft with +2 assist and wheelchair follow.  PT Short Term Goal 4 (Week 1): Patient will initiate stair training.  PT Short Term Goal 5 (Week 1): Patient will perform dynamic standing balance x 2 min with +2 assist.   Skilled Therapeutic Interventions/Progress Updates:    Sessio nfocused on bed mobility, seated balance, functional use of LUE. Pt extremely lethargic throughout session, difficulty keeping eyes open.  Pt rolled L/R w/ TotalA to initiate roll then ModA to complete roll in order to pull pants up. Pt moved supine>sit w/ use of rails and HOB elevated w/ MaxA. Tolerated sitting EOB for 10', Mod-TotalA to maintain balance due to pushing to R. Intermittently able to release L hand from bedrail and touch target <25% of the time. Pt benefited from visual and tactile cueing vs verbal cueing for task. With max cueing pt able to reaching forward and to L to grab tissue from box with L hand to address L weight shift and midline orientation. Pt continually required max multimodal cues to maintain eyes open while sitting. When asked if pt needed to lay back down she nodded emphatically and began to lay down to L side. After laying down noted pt with incontinent episode. NT present to assist with hygiene. Pt missed last 30 minutes of session due to fatigue and incontinent episode.  Therapy Documentation Precautions:   Precautions Precautions: Fall Precaution Comments: R field cut/R inattention, pushes to R  Restrictions Weight Bearing Restrictions: No Pain: Pain Assessment Pain Assessment: Faces Faces Pain Scale: No hurt  See FIM for current functional status  Therapy/Group: Individual Therapy  Hosie SpangleGodfrey, Ahmoni Edge  Hosie SpangleJess Collin Hendley, PT, DPT 06/06/2014, 7:52 AM

## 2014-06-06 NOTE — Progress Notes (Signed)
Occupational Therapy Session Note  Patient Details  Name: Carolyn [name] MRN: [id number] Date of Birth: [birth year]  Today's Date: 06/06/2014 OT Individual Time: [id number] OT Individual Time Calculation (min): 50 min    Skilled Therapeutic Interventions/Progress Updates:    Pt supine in bed with her son present.  She was unaware initially that he was in the room.  When therapist asked her if there were more people in the room besides her and the therapist, she nodded her head no.  Once therapist made her direct her visual attention to midline and she was able to see him and then correctly identify him thorough head movements when asked if it was her son.  During session the only verbal comments she made were "okay" when asked to perform a task.  This was noted only two times during session.  Pt noted to be incontinent of stool in her brief as well as bed pad soaked with urine.  Worked on rolling in bed and toilet hygiene during session.  Pt needing max demonstrational cueing and total assist to initiate and complete rolling to either side to begin session.  She progressed to rolling to the right with mod assist but still total assist to the left.  Transitioned to sitting in preparation for wheelchair transfer with max assist.  Pt needed total assist for sitting balance as she would push significantly with the LUE, exhibiting LOB to the right side.  Total assist +2 for transfer to the wheelchair using sliding board.  Pt left in room with family present and safety belt in place.  Pt also in tilt in space wheelchair.   Therapy Documentation Precautions:  Precautions Precautions: Fall Precaution Comments: R field cut/R inattention, pushes to R  Restrictions Weight Bearing Restrictions: No  Pain: Pain Assessment Faces Pain Scale: No hurt ADL: See FIM for current functional status  Therapy/Group: Individual Therapy  Carolyn [name] OTR/L 06/06/2014, 4:03 PM

## 2014-06-07 ENCOUNTER — Inpatient Hospital Stay (HOSPITAL_COMMUNITY): Payer: BLUE CROSS/BLUE SHIELD | Admitting: Physical Therapy

## 2014-06-07 ENCOUNTER — Inpatient Hospital Stay (HOSPITAL_COMMUNITY): Payer: BLUE CROSS/BLUE SHIELD | Admitting: Occupational Therapy

## 2014-06-07 ENCOUNTER — Inpatient Hospital Stay (HOSPITAL_COMMUNITY): Payer: BLUE CROSS/BLUE SHIELD | Admitting: Speech Pathology

## 2014-06-07 NOTE — Progress Notes (Signed)
Speech Language Pathology Daily Session Note  Patient Details  Name: Carolyn [name] MRN: [id number] Date of Birth: [birth year]  Today's Date: 06/07/2014 SLP Individual Time: 1137-1207 SLP Individual Time Calculation (min): 30 min  Short Term Goals: Week 1: SLP Short Term Goal 1 (Week 1): Patient will consume current diet without overt s/s of aspiration with Mod A verbal cues for use of swallow strategies.  SLP Short Term Goal 2 (Week 1): Patient will scan to midline with Mod A multimodal cues during functional and familiar tasks. SLP Short Term Goal 3 (Week 1): Patient will demonstrate sustained attention to functional and familiar tasks for 5 minutes with Mod A verbal cues for redirection.  SLP Short Term Goal 4 (Week 1): Patient will identify functional items from a field of 2 with 75% accuracy with Mod A multimodal cues.  SLP Short Term Goal 5 (Week 1): Patient will name functional items with Max A multimodal cues with 25% accuracy.  SLP Short Term Goal 6 (Week 1): Patient will phonate on command with Max A multimodal cues in 25% of opportunities .   Skilled Therapeutic Interventions:  Pt was seen for skilled ST targeting goals for dysphagia.  Upon arrival, pt was seated upright in wheelchair, awake, alert, and agreeable to participate in ST.  Pt was noted to impulsively take large bites of dys 2 textures and benefited from overall max assist verbal, visual, and tactile cues for utilization of rate and portion control to monitor.  With the abovementioned SLP interventions for use of swallowing precautions, pt exhibited minimal left sided buccal residue post swallow.  No overt s/s of aspiration were noted with solid textures or liquids.  SLP also facilitated the session with hand over hand assist faded to mod-max visual and verbal cues to monitor and correct right sided anterior loss of materials resulting from decreased labial seal.  Pt indicated that she was finished eating lunch after only  eating a few bites of dys 2 textures; however, pt was amenable to eating a magic cup and mouthed what flavor she preferred when offered a choice of two.  At the end of the session, pt left upright in wheelchair at the nursing station with quick release belt donned.  Continue per current plan of care.    FIM:  Comprehension Comprehension Mode: Auditory Comprehension: 2-Understands basic 25 - 49% of the time/requires cueing 51 - 75% of the time Expression Expression Mode: Verbal Expression: 2-Expresses basic 25 - 49% of the time/requires cueing 50 - 75% of the time. Uses single words/gestures. Social Interaction Social Interaction: 2-Interacts appropriately 25 - 49% of time - Needs frequent redirection. Problem Solving Problem Solving: 2-Solves basic 25 - 49% of the time - needs direction more than half the time to initiate, plan or complete simple activities Memory Memory: 1-Recognizes or recalls less than 25% of the time/requires cueing greater than 75% of the time FIM - Eating Eating Activity: 5: Needs verbal cues/supervision;4: Helper checks for pocketed food;4: Help with picking up utensils  Pain Pain Assessment Pain Assessment: Faces Faces Pain Scale: No hurt  Therapy/Group: Individual Therapy  Carolyn [name], Carolyn [name] 06/07/2014, 3:57 PM

## 2014-06-07 NOTE — Progress Notes (Signed)
Social Work  Social Work Assessment and Plan  Patient Details  Name: Carolyn [name] MRN: [id number] Date of Birth: 04-05-1945  Today's Date: 06/04/2014  Problem List:  Patient Active Problem List   Diagnosis Date Noted  . Embolic stroke   . HLD (hyperlipidemia)   . Aphasia   . Right hemiplegia   . Endotracheal tube present   . Stroke   . 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 Acute respiratory failure, unspecified whether with hypoxia or hypercapnia   . Acute respiratory failure   . Bradycardia 05/23/2014  . Cerebral brain hemorrhage   . Respiratory failure   . Junctional bradycardia   . Arterial hypotension   . Nontraumatic hemorrhage of cerebral hemisphere 05/20/2014  . Hypertensive emergency 05/20/2014   Past Medical History:  Past Medical History  Diagnosis Date  . Hypertension   . AAA (abdominal aortic aneurysm)     a. s/p stent grafting @ [facility].  . Hemorrhagic cerebrovascular accident (CVA)     a. 05/20/2014 L hemispheric hemorrhagic CVA s/p decompressive craniotomy and evacuation.  . H/O echocardiogram     a. 04/2014 Echo: EF 65-70%, Gr 1 DD, mild LVH.   Past Surgical History:  Past Surgical History  Procedure Laterality Date  . Abdominal aortic aneurysm repair    . Craniotomy Left 05/20/2014    Procedure: CRANIOTOMY HEMATOMA EVACUATION;  Surgeon: Aliene Beamsandy Kritzer, MD;  Location: [facility];  Service: Neurosurgery;  Laterality: Left;   Social History:  reports that she quit smoking about 9 years ago. She does not have any smokeless tobacco history on file. She reports that she drinks alcohol. She reports that she does not use illicit drugs.  Family / Support Systems Marital Status: Divorced How Long?: over 15 yrs Patient Roles: Parent, Engineer, structuralCaregiver (helped to provide support to ex-husband;  cared for grchildren after school) Spouse/Significant Other: Daughter reports that pt and husband divorced many years ago due to spouse's mental illness  (schizophrenia), however, she is still supportive to him and checked on him regularly. Children: daughter, Alver Sorrowlicia Hemric [facility]([place]) @ [phone](H) [phone] or [phone](C) [phone]; son, Virgina OrganBob Laduca [facility]([place]) @ [phone];   son, Thomas HoffMike Morison [facility]([place]) @ [phone] Anticipated Caregiver: Daughter reports that one of her brothers is considering the idea of bringing patient to his home after d/c where he and his wife might provide care.  Daughter reports that son's wife is also battling metastatic breast cancer and that they would also need to hire some private duty assistance during the day if they do this.   Ability/Limitations of Caregiver: all three children work (dtr works at [employer] and states they may consider her for SNF);  while son is "considering" d/c to his home, daughter does not feel this is realistic unless exceptional progress made by pt on CIR. Caregiver Availability: Other (Comment) (only evenings unless they hire private assistance) Family Dynamics: Daughter reports that all three children have very good relationship with pt  and with one another.  Reports they are "...all coping with it (mother's stroke) a little differently...we will need to get on the same page about our plans though..."  Social History Preferred language: English Religion:  Cultural Background: NA Read: Yes Write: Yes Employment Status: Employed Name of Employer: [employer] - full-time Environmental health practitioneradministrative assistant Return to Work Plans: Would not anticipate a return given the extent of her current deficits Fish farm managerLegal Hisotry/Current Legal Issues: None Guardian/Conservator: daughter has POA for pt   Abuse/Neglect Physical Abuse: Denies Verbal  Abuse: Denies Sexual Abuse: Denies Exploitation of patient/patient's resources: Denies Self-Neglect: Denies  Emotional Status Pt's affect, behavior adn adjustment status: Pt laying in bed and holding her forehead.  When asked if having pain she states, "Yes" and  grimacing.  Alerted RN.  Attempted to introduce myself, however, pt with severe expressive aphasia and cannot complete interview on her own behalf.  Observed pt later in therapy session where she was participating without any apparent distress.  Will monitor mood as her  speech and cognition improves overall.   Recent Psychosocial Issues: Chronic support needs of her ex-husband, however, she shared these responsibilities with other family members Pyschiatric History: None Substance Abuse History: None  Patient / Family Perceptions, Expectations & Goals Pt/Family understanding of illness & functional limitations: Cannot assess pt's awareness.  Daughter reports that she and her brothers have a basic understandning of the extent of her hemorrhage but "in different places" as far as recovery expectations.  Daughter feels they will develop a much clearer understanding as she progresses through CIR. 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 Pt/family expectations/goals: Daughter hopeful pt will be able to indicate need to go to the bathroom and "help us to help her..." both physically and communication.  Community Resources Levi StraussCommunity Agencies: None Premorbid Home Care/DME Agencies: None Transportation available at discharge: yes Resource referrals recommended: Neuropsychology, Support group (specify)  Discharge Planning Living Arrangements: Alone Support Systems: Children Type of Residence: Private residence Insurance Resources: Harrah[employer] EntertainmentMedicare, Media plannerrivate Insurance (specify) (BCBS is primary) Architectinancial Resources: Employment, Restaurant manager, fast foodocial Security Financial Screen Referred: No Living Expenses: Own Money Management:  Patient Does the patient have any problems obtaining your medications?: No Home Management: pt was independent with this PTA Patient/Family Preliminary Plans: Daughter reports that family considering arranging 24/7 care at home vs SNF (most likely) Barriers to Discharge: Steps, Self care, Family Support Social Work Anticipated Follow Up Needs: HH/OP, SNF Expected length of stay: 4 weeks  Clinical Impression Very unfortunate mother and grandmother here following a brain hemorrhage and with significant physical and cognitive deficits.  Was working f/t and providing after-school care to her grandchildren PTA.  Three very supportive adult children in the area, however, all are working f/t.  Will likeley need to pursue SNF following CIR. Family considering arranging 2/47 care at son's home, however, this may not be the best option immediately after CIR.  Family very distressed by this event and will need ongoing support as well as patient.  Will follow for support and d/c planning needs.  Bret Stamour 06/04/2014, 2:48 PM

## 2014-06-07 NOTE — Progress Notes (Signed)
Occupational Therapy Session Note  Patient Details  Name: Carolyn [name] MRN: [id number] Date of Birth: [birth year]  Today's Date: 06/07/2014 OT Individual Time: 1400-1459 OT Individual Time Calculation (min): 59 min    Skilled Therapeutic Interventions/Progress Updates:    [facility] squat pivot using Bobath method.  Pt with delayed initiation to assist with transfer as well.  Worked on sitting balance with mirror for feedback.  Pt with increased posterior pelvic tilt as well as increased trunk flexion both forward and laterally to the right side.  Pt still with significant pusher tendencies with the LUE and trunk when allowed to place the LUE on the mat.  Noted these were decreased somewhat when therapist had her place the left hand around the right wrist in her lap.  Pt with increased LOB forward, backward, and to the right at times, requiring total assist overall to correct.  Pt was able to initiate forward balance correction when leaning back, but needed verbal cueing and min assist or she would continue falling posteriorly.  She was unable to stop herself once she reached midline and needed max assist to keep from falling to the right and forward at this time.  Integrated functional reaching task with the LUE to help decrease pushing as well.  Pt not able to accurately reach for the object when given verbal commands but she was able to reach with demonstrational commands.  Pt transferred back to the wheelchair via sliding board with total assist +2 to the right side.  Once back in the room pt was transferred back to the bed squat pivot to the left side with total assist.  Bed alarm and soft touch call button within reach however pt unable to demonstrate method for calling for assist when therapist asked.    Therapy Documentation Precautions:  Precautions Precautions: Fall Precaution Comments: R field cut/R inattention, pushes to R   Restrictions Weight Bearing Restrictions: No  Vital Signs: Therapy Vitals Temp: 97.7 F (36.5 C) Temp Source: Oral Pulse Rate: 80 Resp: 18 BP: (!) 144/67 mmHg Patient Position (if appropriate): Sitting Oxygen Therapy SpO2: 97 % O2 Device: Not Delivered Pain: Pain Assessment Pain Assessment: Faces Faces Pain Scale: No hurt ADL: ADL ADL Comments: Refer to FIM  See FIM for current functional status  Therapy/Group: Individual Therapy  Annamarie Yamaguchi OTR/L 06/07/2014, 4:32 PM

## 2014-06-07 NOTE — Progress Notes (Signed)
Occupational Therapy Session Note  Patient Details  Name: Carolyn [name] MRN: [id number] Date of Birth: Jan 03, 1946  Today's Date: 06/07/2014 OT Individual Time: 1000-1100 OT Individual Time Calculation (min): 60 min    Short Term Goals: Week 1:  OT Short Term Goal 1 (Week 1): Pt will be able to sit to EOB with mod A. OT Short Term Goal 2 (Week 1): Pt will maintain static sitting EOB with mod A. OT Short Term Goal 3 (Week 1): Pt will demonstrate increased awareness of RUE to wash it with min A. OT Short Term Goal 4 (Week 1): Pt will don shirt with max A. OT Short Term Goal 5 (Week 1): Pt will roll in bed with mod A to assist caregivers with changing her LB clothing.  Skilled Therapeutic Interventions/Progress Updates:    1:1 Pt in bed when arrived grimacing and moaning. Assisted pt with max A to roll to her right - pt beginning to be incontinent of bowel. Transitioned to sitting EOB with rolling to her left and side lying into sitting with total A +2 (pt <5%). Pt with heavy anterior lean with no righting reaction and pushing with her left LE. Transferred squat pivot into regular w/c with total A +2. Once in w/c continued to present with heavy pushing to her left requiring extra time to decr fear of falling and pushing with +2 and then able to transition to one person. Pt then transferred w/c<toilet with out allowing pt to use left LE to decr pushing. Once on toilet pt able to void urine and BM with extra time and groaning ceased and was able to relax. However pt did require max A for sitting balance on toilet.  Therapist sat in front of her to achieve safe anterior lean while tech perform hygiene. Transferred back to w/c with total A +2 (pt 15%). Transitioned to sink to focus on dressing with visual and body awareness to left body and environment, maintaining head and shoulder in midline with use of mirror and therapist's positioning. 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 Pt actively participating in dressing and grooming tasks with extra time. Pt also answering yes/no questions with head nods more often then when this therapist observed on Friday. Pt participate in sit to stands x3 with total A +2 for clothing management to pull up pants. Pt more alert and trying to follow one step motor commands in context of familiar ADL tasks with max multimodal cuing. Dtr arrived at end of session. Discussed progress, clinical findings and simple recommendations for positioning and pressure relief in w/c. Left with Dtr in room with safety belt on in tilt in space w/c.  Therapy Documentation Precautions:  Precautions Precautions: Fall Precaution Comments: R field cut/R inattention, pushes to R  Restrictions Weight Bearing Restrictions: No   Pain: Grimaces- took her to toilet - able to go. Also was holding head - RN gave meds ADL: ADL ADL Comments: Refer to FIM  See FIM for current functional status  Therapy/Group: Individual Therapy  Roney MansSmith, Nikan Ellingson [facility] 06/07/2014, 2:36 PM

## 2014-06-07 NOTE — Progress Notes (Signed)
[place] PHYSICAL MEDICINE & REHABILITATION     PROGRESS NOTE    Subjective/Complaints: No problems reported overnight. Pt alert and appears comfortable this am ROS limited due to severe aphasia  Objective: Vital Signs: Blood pressure 147/57, pulse 81, temperature 98.1 F (36.7 C), temperature source Oral, resp. rate 18, weight 87.5 kg (192 lb 14.4 oz), SpO2 96 %. No results found. No results for input(s): WBC, HGB, HCT, PLT in the last 72 hours. No results for input(s): NA, K, CL, GLUCOSE, BUN, CREATININE, CALCIUM in the last 72 hours.  Invalid input(s): CO CBG (last 3)  No results for input(s): GLUCAP in the last 72 hours.  Wt Readings from Last 3 Encounters:  06/03/14 87.5 kg (192 lb 14.4 oz)  06/03/14 90.4 kg (199 lb 4.7 oz)    Physical Exam:  HENT:  Craniotomy site clean and dry  Eyes:  Pupils reactive to light without nystagmus  Neck: Normal range of motion. Neck supple. No thyromegaly present.  Cardiovascular:  Cardiac rate controlled, regular, no murmur  Respiratory:  Inconsistent effort but clear to auscultation  GI: Soft. Bowel sounds are normal. She exhibits no distension.  Neurological:  Patient alert.  Follows basic commands.  expressive greater than receptive aphasia--no words for me. 0/5 RUE and RLE.  early flexor tone in RUE. Spontaneous movement of left arm and leg.. Right HH. Pinch on RUE and RLE elicits withdrawl Psychiatric:  flat   Assessment/Plan: 1. 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 Physiatrist is providing close team supervision and 24 hour management of active medical problems listed below. Physiatrist and rehab team continue to assess barriers to discharge/monitor patient progress toward functional and medical goals. FIM: FIM - Bathing Bathing Steps Patient Completed: Chest, Abdomen Bathing:  1: Total-Patient completes 0-2 of 10 parts or less than 25%  FIM - Upper Body Dressing/Undressing Upper body dressing/undressing: 0: Wears gown/pajamas-no public clothing FIM - Lower Body Dressing/Undressing Lower body dressing/undressing: 1: Two helpers  FIM - Toileting Toileting: 1: Total-Patient completed zero steps, helper did all 3  FIM - ArchivistToilet Transfers Toilet Transfers: 0-Activity did not occur  FIM - BankerBed/Chair Transfer Bed/Chair Transfer Assistive Devices: HOB elevated, Bed rails Bed/Chair Transfer: 2: Supine > Sit: Max A (lifting assist/Pt. 25-49%), 1: Two helpers  FIM - Locomotion: Wheelchair Distance: 5 ft Locomotion: Wheelchair: 0: Activity did not occur FIM - Locomotion: Ambulation Locomotion: Ambulation Assistive Devices: Other (comment) (3 musketeers) Ambulation/Gait Assistance: 1: +2 Total assist Locomotion: Ambulation: 0: Activity did not occur  Comprehension Comprehension Mode: Auditory Comprehension: 2-Understands basic 25 - 49% of the time/requires cueing 51 - 75% of the time  Expression Expression Mode: Nonverbal Expression: 1-Expresses basis less than 25% of the time/requires cueing greater than 75% of the time.  Social Interaction Social Interaction: 1-Interacts appropriately less than 25% of the time. May be withdrawn or combative.  Problem Solving Problem Solving: 1-Solves basic less than 25% of the time - needs direction nearly all the time or does not effectively solve problems and may need a restraint for safety  Memory Memory: 1-Recognizes or recalls less than 25% of the time/requires cueing greater than 75% of the time  Medical Problem List and Plan: 1. 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 DVT Prophylaxis/Anticoagulation: Subcutaneous heparin initiated 05/24/2014. Monitor for any bleeding episodes 3. Pain Management: Tylenol as needed 4. Seizure prophylaxis. Keppra 500 mg  twice a  day. EEG negative 5. Neuropsych: This patient is not capable of making decisions on her own behalf. 6. Skin/Wound Care: Routine skin checks 7. Fluids/Electrolytes/Nutrition: Follow i's and o's, po intake, check labs.  8. Dysphagia. Dysphagia #2 nectar liquids. Monitor hydration follow-up speech therapy 9. MRSA PCR screening positive. Contact precautions 10. Hypertension. Toprol-XL 25 mg daily. Continue to check per shift. No changes to regimen at present 11. Hypothyroidism. Synthroid   LOS (Days) 4 A FACE TO FACE EVALUATION WAS PERFORMED  SWARTZ,ZACHARY T 06/07/2014 7:46 AM

## 2014-06-07 NOTE — Progress Notes (Signed)
Physical Therapy Session Note  Patient Details  Name: Carolyn [name] MRN: [id number] Date of Birth: 03/27/1945  Today's Date: 06/07/2014 PT Individual Time: 1300-1400 PT Individual Time Calculation (min): 60 min   Short Term Goals: Week 1:  PT Short Term Goal 1 (Week 1): Patient will perform bed mobility with max A x 1.  PT Short Term Goal 2 (Week 1): Patient will perform bed <> wheelchair transfer with mod A x 2.  PT Short Term Goal 3 (Week 1): Patient will ambulate 10 ft with +2 assist and wheelchair follow.  PT Short Term Goal 4 (Week 1): Patient will initiate stair training.  PT Short Term Goal 5 (Week 1): Patient will perform dynamic standing balance x 2 min with +2 assist.   Skilled Therapeutic Interventions/Progress Updates:   Session focused on following one step commands, sitting balance, midline orientation, decreasing pushing tendencies to R, sit <> stand, and lateral scoot transfers. Performed lateral scoot transfers TIS wheelchair <> level mat table to R and L with patient's LUE in lap or on therapist's shoulder to decrease pushing tendencies, max-total A x 2. Performed sitting balance with BLE supported and wedge under R hip, LUE frequently repositioned in patient's lap to decrease heavy pushing to R and mirror in front for visual feedback. Patient performed reaching for targets on left using LUE to facilitate lateral weight shift to L and decrease pushing to R. Patient required min-total A for sitting balance with LOB to right, posterior, and anterior. With posterior LOB, patient demonstrated delayed balance reaction but still required assist to regain balance. Patient more successful with task with therapist initiating removing LUE from mat in order to reach for object. Patient also worked on static sitting balance with focus on midline orientation, min-total A for balance. Patient instructed in sitting <> propping on L forearm for active rest and to decrease pushing. Performed sit  <> stand x 4 from mat with max-total A x 2, therapist under RUE and providing downward approximation RLE, rehab tech providing L HHA. Patient with initial delay in coming to standing keeping knees and trunk flexed, requiring max multimodal cueing to come to full stand. Performed lateral weight shifting to L and R with total A. Patient noted to be more alert and able to follow basic one step commands 75% of time compared to initial evaluation. Patient required total multimodal cues to attend to R and for head turn to R. Patient transferred back to wheelchair and repositioned back in chair with total A, handoff to OT for next session.  Therapy Documentation Precautions:  Precautions Precautions: Fall Precaution Comments: R field cut/R inattention, pushes to R  Restrictions Weight Bearing Restrictions: No Pain: Pain Assessment Pain Assessment: Faces Faces Pain Scale: No hurt  See FIM for current functional status  Therapy/Group: Individual Therapy  Kerney ElbeVarner, Tanikka Bresnan A 06/07/2014, 1:59 PM

## 2014-06-08 ENCOUNTER — Inpatient Hospital Stay (HOSPITAL_COMMUNITY): Payer: BLUE CROSS/BLUE SHIELD

## 2014-06-08 ENCOUNTER — Inpatient Hospital Stay (HOSPITAL_COMMUNITY): Payer: BLUE CROSS/BLUE SHIELD | Admitting: Physical Therapy

## 2014-06-08 ENCOUNTER — Inpatient Hospital Stay (HOSPITAL_COMMUNITY): Payer: BLUE CROSS/BLUE SHIELD | Admitting: Speech Pathology

## 2014-06-08 DIAGNOSIS — I612 Nontraumatic intracerebral hemorrhage in hemisphere, unspecified: Secondary | ICD-10-CM

## 2014-06-08 MED ORDER — ACETAMINOPHEN 325 MG PO TABS
650.0000 mg | ORAL_TABLET | Freq: Three times a day (TID) | ORAL | Status: DC
  Administered 2014-06-08 – 2014-06-30 (×55): 650 mg
  Filled 2014-06-08 (×55): qty 2

## 2014-06-08 MED ORDER — ACETAMINOPHEN 650 MG RE SUPP
650.0000 mg | Freq: Three times a day (TID) | RECTAL | Status: DC
  Filled 2014-06-08 (×18): qty 1

## 2014-06-08 NOTE — Progress Notes (Signed)
[place] PHYSICAL MEDICINE & REHABILITATION     PROGRESS NOTE    Subjective/Complaints: Had a good night. Denies headache or pain. No sob, cough ROS limited due to severe aphasia  Objective: Vital Signs: Blood pressure 140/99, pulse 79, temperature 98.1 F (36.7 C), temperature source Oral, resp. rate 18, weight 87.5 kg (192 lb 14.4 oz), SpO2 97 %. No results found. No results for input(s): WBC, HGB, HCT, PLT in the last 72 hours. No results for input(s): NA, K, CL, GLUCOSE, BUN, CREATININE, CALCIUM in the last 72 hours.  Invalid input(s): CO CBG (last 3)  No results for input(s): GLUCAP in the last 72 hours.  Wt Readings from Last 3 Encounters:  06/03/14 87.5 kg (192 lb 14.4 oz)  06/03/14 90.4 kg (199 lb 4.7 oz)    Physical Exam:  HENT:  Craniotomy site clean and dry  Eyes:  Pupils reactive to light without nystagmus  Neck: Normal range of motion. Neck supple. No thyromegaly present.  Cardiovascular:  Cardiac rate controlled, regular, no murmur  Respiratory:  Inconsistent effort but clear to auscultation  GI: Soft. Bowel sounds are normal. She exhibits no distension.  Neurological:  Patient alert.  Follows basic commands.  expressive greater than receptive aphasia--offering one word answers to simple questions at least 50% of time---better with spontaneous speech. Fairly reliable with yes/no answers. 0/5 RUE and RLE.  early flexor tone in RUE. Spontaneous movement of left arm and leg.. Right HH. Pinch on RUE and RLE elicits withdrawl Psychiatric:  flat   Assessment/Plan: 1. 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 Physiatrist is providing close team supervision and 24 hour management of active medical problems listed below. Physiatrist and rehab team continue to assess barriers to discharge/monitor patient progress toward  functional and medical goals. FIM: FIM - Bathing Bathing Steps Patient Completed: Chest, Abdomen Bathing: 1: Total-Patient completes 0-2 of 10 parts or less than 25%  FIM - Upper Body Dressing/Undressing Upper body dressing/undressing steps patient completed: Put head through opening of pull over shirt/dress Upper body dressing/undressing: 2: Max-Patient completed 25-49% of tasks FIM - Lower Body Dressing/Undressing Lower body dressing/undressing: 1: Two helpers  FIM - Toileting Toileting: 1: Two helpers  FIM - ArchivistToilet Transfers Toilet Transfers: 1-Two helpers  FIM - BankerBed/Chair Transfer Bed/Chair Transfer Assistive Devices: HOB elevated, Bed rails Bed/Chair Transfer: 1: Two helpers  FIM - Locomotion: Wheelchair Distance: 5 ft Locomotion: Wheelchair: 1: Total Assistance/staff pushes wheelchair (Pt<25%) FIM - Locomotion: Ambulation Locomotion: Ambulation Assistive Devices: Other (comment) (3 musketeers) Ambulation/Gait Assistance: 1: +2 Total assist Locomotion: Ambulation: 0: Activity did not occur  Comprehension Comprehension Mode: Auditory Comprehension: 2-Understands basic 25 - 49% of the time/requires cueing 51 - 75% of the time  Expression Expression Mode: Verbal Expression: 2-Expresses basic 25 - 49% of the time/requires cueing 50 - 75% of the time. Uses single words/gestures.  Social Interaction Social Interaction: 2-Interacts appropriately 25 - 49% of time - Needs frequent redirection.  Problem Solving Problem Solving: 2-Solves basic 25 - 49% of the time - needs direction more than half the time to initiate, plan or complete simple activities  Memory Memory: 1-Recognizes or recalls less than 25% of the time/requires cueing greater than 75% of the time  Medical Problem List and Plan: 1. 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 DVT Prophylaxis/Anticoagulation: Subcutaneous heparin initiated 05/24/2014. Monitor  for any bleeding episodes  3. Pain Management: Tylenol as needed, warm/cold compresses for head 4. Seizure prophylaxis. Keppra 500 mg twice a day. EEG negative 5. Neuropsych: This patient is not capable of making decisions on her own behalf. 6. Skin/Wound Care: Routine skin checks 7. Fluids/Electrolytes/Nutrition: Follow i's and o's, po intake, check labs.  8. Dysphagia. Dysphagia #2 nectar liquids. Encourage PO 9. MRSA PCR screening positive. Contact precautions 10. Hypertension. Toprol-XL 25 mg daily. Continue to check per shift. No changes to regimen at present 11. Hypothyroidism. Synthroid    LOS (Days) 5 A FACE TO FACE EVALUATION WAS PERFORMED  SWARTZ,ZACHARY T 06/08/2014 7:49 AM

## 2014-06-08 NOTE — Progress Notes (Signed)
Occupational Therapy Session Note  Patient Details  Name: Carolyn CourtGeraldine Eduardo MRN: [id number] Date of Birth: [birth year]  Today's Date: 06/08/2014 OT Individual Time: 1000-1100 OT Individual Time Calculation (min): 60 min    Short Term Goals: Week 1:  OT Short Term Goal 1 (Week 1): Pt will be able to sit to EOB with mod A. OT Short Term Goal 2 (Week 1): Pt will maintain static sitting EOB with mod A. OT Short Term Goal 3 (Week 1): Pt will demonstrate increased awareness of RUE to wash it with min A. OT Short Term Goal 4 (Week 1): Pt will don shirt with max A. OT Short Term Goal 5 (Week 1): Pt will roll in bed with mod A to assist caregivers with changing her LB clothing.  Skilled Therapeutic Interventions/Progress Updates:    1:1 Engaged in self care retraining including dressing and grooming at the sink.  Focused on postural control and alignment at midline with max cuing in supported and unsupported sitting, hand over hand assistance for hemi dressing with shirt and pants, sit to stand 3 musketeers style, visual attention to right body and environment, following one step directions, answering yes/ no questions with contextual cues. Pt required max A for anterior weight shift in the w/c to come forwards during UB dressing, spitting into the sink with tooth brushing and in prep for sit to stands. Pt did demonstrate automatic initiation with dressing of familiar items. Pt needed max instructional cues for sequencing and task organization. Pt performed sit to stands with max A+2 3 musketeer method. Pt with difficulty achieving fully upright postioning. Pt continues to demonstrate apraxia with grooming tools.    Daughter observed the last 30 min of session. Transitioned to the gym to focus on squat pivot transfer w/c<>mat using bobath method with total A +2 with focus on postural control and decrease pushing towards weak side. Once EOM focus on postural control at midline with tactile, Verbal and visual  cues with mirror feedback. Lateral reaching to the right to promote weight shift towards her right (the side she pushes away from). Left in tilt in space w/c with daughter.   Therapy Documentation Precautions:  Precautions Precautions: Fall Precaution Comments: R field cut/R inattention, pushes to R  Restrictions Weight Bearing Restrictions: No Pain:  grimaces with standing initially - relief with rest ADL: ADL ADL Comments: Refer to FIM  See FIM for current functional status  Therapy/Group: Individual Therapy  Roney MansSmith, Syair Fricker [facility] 06/08/2014, 3:22 PM

## 2014-06-08 NOTE — Progress Notes (Signed)
Speech Language Pathology Daily Session Note  Patient Details  Name: Carolyn [name] MRN: [id number] Date of Birth: 1945/07/30  Today's Date: 06/08/2014 SLP Individual Time: 0800-0900 SLP Individual Time Calculation (min): 60 min  Short Term Goals: Week 1: SLP Short Term Goal 1 (Week 1): Patient will consume current diet without overt s/s of aspiration with Mod A verbal cues for use of swallow strategies.  SLP Short Term Goal 2 (Week 1): Patient will scan to midline with Mod A multimodal cues during functional and familiar tasks. SLP Short Term Goal 3 (Week 1): Patient will demonstrate sustained attention to functional and familiar tasks for 5 minutes with Mod A verbal cues for redirection.  SLP Short Term Goal 4 (Week 1): Patient will identify functional items from a field of 2 with 75% accuracy with Mod A multimodal cues.  SLP Short Term Goal 5 (Week 1): Patient will name functional items with Max A multimodal cues with 25% accuracy.  SLP Short Term Goal 6 (Week 1): Patient will phonate on command with Max A multimodal cues in 25% of opportunities .   Skilled Therapeutic Interventions: Skilled treatment session focused on dysphagia and speech/language goals. SLP facilitated session by repositioning patient in bed to maximize arousal and alertness. Patient consumed Dys. 2 textures with thin liquids without overt s/s of aspiration but required Mod A verbal and visual cues for use of swallow strategies. Patient also required Mod-Max multimodal cues for functional problem solving with self-feeding.  Patient demonstrated increased ability to scan to midline and right field of environment with functional tasks and while attending to clinician. Patient answered yes/no questions in regards to wants/needs with 100% accuracy and answered open-ended questions in regards to biographical information with 25% accuracy.  Patient demonstrated increased ability to consistently utilize her voice at the word level.  Patient left supine in bed with alarm on and all needs within reach. Continue with current plan of care.    FIM:  Comprehension Comprehension Mode: Auditory Comprehension: 2-Understands basic 25 - 49% of the time/requires cueing 51 - 75% of the time Expression Expression Mode: Verbal Expression: 2-Expresses basic 25 - 49% of the time/requires cueing 50 - 75% of the time. Uses single words/gestures. Social Interaction Social Interaction: 2-Interacts appropriately 25 - 49% of time - Needs frequent redirection. Problem Solving Problem Solving: 2-Solves basic 25 - 49% of the time - needs direction more than half the time to initiate, plan or complete simple activities Memory Memory: 1-Recognizes or recalls less than 25% of the time/requires cueing greater than 75% of the time FIM - Eating Eating Activity: 5: Needs verbal cues/supervision;4: Helper checks for pocketed food;4: Help with picking up utensils  Pain No/Denies Pain   Therapy/Group: Individual Therapy  Max Romano 06/08/2014, 3:31 PM

## 2014-06-08 NOTE — Progress Notes (Signed)
Pt's.urine cloudy with strong odor; indicates discomfort with voiding. PAC aware,orders received.

## 2014-06-08 NOTE — Progress Notes (Signed)
Occupational Therapy Session Note  Patient Details  Name: Carolyn [name] MRN: [id number] Date of Birth: 07/08/1945  Today's Date: 06/08/2014 OT Individual Time: 1530-1600 OT Individual Time Calculation (min): 30 min    Short Term Goals: Week 1:  OT Short Term Goal 1 (Week 1): Pt will be able to sit to EOB with mod A. OT Short Term Goal 2 (Week 1): Pt will maintain static sitting EOB with mod A. OT Short Term Goal 3 (Week 1): Pt will demonstrate increased awareness of RUE to wash it with min A. OT Short Term Goal 4 (Week 1): Pt will don shirt with max A. OT Short Term Goal 5 (Week 1): Pt will roll in bed with mod A to assist caregivers with changing her LB clothing.  Skilled Therapeutic Interventions/Progress Updates:    Pt seen for 1:1 OT session with focus on bed mobility, command following, cognitive remediation, and PROM to RUE. Pt received supine in bed incontinent of bladder. Completed rolling R<>L to complete hygiene and changing of brief with max-total A +2. Pt grimacing upon lowering to HOB flat and tearful however unable to determine discomfort due to language deficits. Pt nodding head "yes" to option of toileting therefore placed bedpan however no output noted. Upon removal of bedpan pt no longer demonstrating discomfort. With repetition, pt demonstrated carryover with sequencing of rolling to R as she initiated R knee flexion and reaching to R with LUE. Pt required max A to roll to R and total-total +2 for rolling to L. Provided PROM to RUE with no tone or muscle activation noted, however pt visually attending to RUE. Pt accurately recalled number of children and verbalized 2/3 names of children. Pt required total A for orientation. Pt left supine in bed with HOB elevated and all needs in reach.   Therapy Documentation Precautions:  Precautions Precautions: Fall Precaution Comments: R field cut/R inattention, pushes to R  Restrictions Weight Bearing Restrictions: No General:    Vital Signs: Therapy Vitals Temp: 97.7 F (36.5 C) Temp Source: Oral Pulse Rate: 80 Resp: 18 BP: (!) 122/59 mmHg Patient Position (if appropriate): Lying Oxygen Therapy SpO2: 99 % O2 Device: Not Delivered Pain: Pt unable to verbalize pain; pt grimaced in pain with shoulder flexion approx 80 degrees See FIM for current functional status  Therapy/Group: Individual Therapy  Daneil Danerkinson, Kuuipo Anzaldo N 06/08/2014, 4:05 PM

## 2014-06-08 NOTE — Progress Notes (Signed)
Physical Therapy Session Note  Patient Details  Name: Carolyn [name] MRN: [id number] Date of Birth: [birth year]  Today's Date: 06/08/2014 PT Individual Time: 0900-1000 PT Individual Time Calculation (min): 60 min   Short Term Goals: Week 1:  PT Short Term Goal 1 (Week 1): Patient will perform bed mobility with max A x 1.  PT Short Term Goal 2 (Week 1): Patient will perform bed <> wheelchair transfer with mod A x 2.  PT Short Term Goal 3 (Week 1): Patient will ambulate 10 ft with +2 assist and wheelchair follow.  PT Short Term Goal 4 (Week 1): Patient will initiate stair training.  PT Short Term Goal 5 (Week 1): Patient will perform dynamic standing balance x 2 min with +2 assist.   Skilled Therapeutic Interventions/Progress Updates:   Session focused on functional transfers, standing tolerance, midline orientation, decreasing pusher tendencies, wheelchair propulsion and positioning, and activity tolerance. Patient with overall improved alertness, vocalizations, and attention to R compared to yesterday. Patient semi reclined in bed, reporting "yes" to questioning for bathroom. Patient transferred supine > sit with HOB elevated and max A x 2 and sit <> stand from edge of bed with +2 assist via 3 musketeers technique to allow RN to perform skin inspection. Patient took a few steps forward with max A x 2 with total A to advance and stabilize RLE while RN brought manual wheelchair up behind patient. Patient shouting out and grimacing in pain during ambulation but unable to verbalize exact location. Patient transferred wheelchair <> commode with +3 assist with total A x 2 and RN holding patient's LUE to prevent her from pushing to R and stabilizing wheelchair. Performed hygiene via forward lean, total A. Patient required mod-max A for sitting balance on commode with therapist on R side between wall and patient. Patient propelled wheelchair using LUE with total HOH progressed to mod-max A x 100 ft, rehab  tech in front of patient providing max verbal/visual cues to correct for midline orientation due to head tilt to R. Placed [name] basic back support in patient's manual wheelchair for improved postural support and sitting tolerance. Performed sit <> stand from wheelchair with max A x 2 to reposition back in wheelchair. Patient left sitting in wheelchair, handoff to OT.   Therapy Documentation Precautions:  Precautions Precautions: Fall Precaution Comments: R field cut/R inattention, pushes to R  Restrictions Weight Bearing Restrictions: No Pain: Pain Assessment Pain Assessment: Faces Faces Pain Scale: Hurts whole lot Pain Type: Acute pain Pain Location:  (unable to verbalize) Pain Descriptors / Indicators: Grimacing;Moaning Pain Onset: With Activity (standing) Pain Intervention(s): RN made aware  See FIM for current functional status  Therapy/Group: Individual Therapy  Kerney ElbeVarner, Erland Vivas A 06/08/2014, 10:11 AM

## 2014-06-08 NOTE — Patient Care Conference (Addendum)
Inpatient RehabilitationTeam Conference and Plan of Care Update Date: 06/08/2014   Time: 3:05 PM    Patient Name: [name] CourtGeraldine Childrey      Medical Record Number: [id number]  Date of Birth: [birth year] Sex: Female         Room/Bed: 4W11C/4W11C-01 Payor Info: Payor: MEDICARE / Plan: MEDICARE PART A AND B / Product Type: *No Product type* /    Admitting Diagnosis: NON TRAUMATIC ICH  Admit Date/Time:  06/03/2014  3:45 PM Admission Comments: No comment available   Primary Diagnosis:  Nontraumatic hemorrhage of cerebral hemisphere Principal Problem: Nontraumatic hemorrhage of cerebral hemisphere  Patient Active Problem List   Diagnosis Date Noted  . Embolic stroke   . HLD (hyperlipidemia)   . Aphasia   . Right hemiplegia   . Endotracheal tube present   . Stroke   . 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 Acute respiratory failure, unspecified whether with hypoxia or hypercapnia   . Acute respiratory failure   . Bradycardia 05/23/2014  . Cerebral brain hemorrhage   . Respiratory failure   . Junctional bradycardia   . Arterial hypotension   . Nontraumatic hemorrhage of cerebral hemisphere 05/20/2014  . Hypertensive emergency 05/20/2014    Expected Discharge Date: Expected Discharge Date: 07/01/14  Team Members Present: Physician leading conference: Dr. Faith RogueZachary Swartz Social Worker Present: Amada JupiterLucy Janeva Peaster, LCSW Nurse Present: Carmie EndAngie Joyce, RN PT Present: Bayard Huggerebecca Varner, PT OT Present: Roney MansJennifer Smith, Carollee SiresT;Kayla Perkinson, OT SLP Present: Feliberto Gottronourtney Payne, SLP PPS Coordinator present : Tora DuckMarie Noel, RN, CRRN     Current Status/Progress Goal Weekly Team Focus  Medical   left ICH, dense right HP, aphasia  improve functional mobility and language  bladder, pain, language, nutrition   Bowel/Bladder   Incontinent bowel and bladder  Continent with timed toileting  Timed toileting q 2-3 hours W/A.   Swallow/Nutrition/ Hydration   Dys. 2 textures with thin liquids, Mod-Max A for use of swallow  strategies  Min A  trials of Dys. 3 textures    ADL's   total A +2 for all bathing, dressing, toileting and transfers  min A overall - will continue to assess if need to downgrade  activty tolerance, visual scanning, attention, functional transfers,    Mobility   min-total A sitting balance, max A x 2 sit <> stand, max-total A x 2 squat pivot and slide board transfers, max-total to +2 bed mobility, pushes to R, fatigues quickly   mod-max A overall  sitting balance, midline orientation, decreasing pusher tendencies, functional transfers, standing tolerance, family education   Communication   Max-Total A  Mod A  naming, object identification, yes/no accuracy, voicing    Safety/Cognition/ Behavioral Observations  Max A  Min-Mod A  sustained attention, attention to right field of enviornment, awareness, problem solving    Pain   Non-verbal s/s headache; Tylenol effective but not holding  Managed at goal 2/10  Observe for non-verbal s/s pain.   Skin   Incision line C.D.I., no s/s infection.  Resolving at D/C , no s/s infection, breakdown  Control incontinence; barrier cream to buttocks with MGP 'paste'    Rehab Goals Patient on target to meet rehab goals: Yes *See Care Plan and progress notes for long and short-term goals.  Barriers to Discharge: profound neuro deficits    Possible Resolutions to Barriers:  nmr, cognitive linguisitc education, family ed, secondary venue of care    Discharge Planning/Teaching Needs:  pt most likely to d/c to SNF      Team  Discussion:  Some language improvements already. Able to hold information between sessions.  Improved posturally and attention to the right.  Better voicing.  Very apraxic.  Strong pushing.  Team pleased overall with improvements made this first week.  SW reports family slowly developing an understanding of deficits and best plans for d/c (which will likely be SNF).  Revisions to Treatment Plan:  None   Continued Need for Acute  Rehabilitation Level of Care: The patient requires daily medical management by a physician with specialized training in physical medicine and rehabilitation for the following conditions: Daily direction of a multidisciplinary physical rehabilitation program to ensure safe treatment while eliciting the highest outcome that is of practical value to the patient.: Yes Daily medical management of patient stability for increased activity during participation in an intensive rehabilitation regime.: Yes Daily analysis of laboratory values and/or radiology reports with any subsequent need for medication adjustment of medical intervention for : Neurological problems;Other  Ermias Tomeo 06/09/2014, 12:57 PM

## 2014-06-08 NOTE — Plan of Care (Signed)
Problem: RH PAIN MANAGEMENT Goal: RH STG PAIN MANAGED AT OR BELOW PT'S PAIN GOAL Outcome: Progressing Managed 3/10

## 2014-06-09 ENCOUNTER — Inpatient Hospital Stay (HOSPITAL_COMMUNITY): Payer: BLUE CROSS/BLUE SHIELD | Admitting: Speech Pathology

## 2014-06-09 ENCOUNTER — Inpatient Hospital Stay (HOSPITAL_COMMUNITY): Payer: BLUE CROSS/BLUE SHIELD

## 2014-06-09 ENCOUNTER — Inpatient Hospital Stay (HOSPITAL_COMMUNITY): Payer: BLUE CROSS/BLUE SHIELD | Admitting: Physical Therapy

## 2014-06-09 DIAGNOSIS — N390 Urinary tract infection, site not specified: Secondary | ICD-10-CM

## 2014-06-09 DIAGNOSIS — A499 Bacterial infection, unspecified: Secondary | ICD-10-CM

## 2014-06-09 LAB — URINALYSIS, ROUTINE W REFLEX MICROSCOPIC
BILIRUBIN URINE: NEGATIVE
GLUCOSE, UA: NEGATIVE mg/dL
HGB URINE DIPSTICK: NEGATIVE
KETONES UR: NEGATIVE mg/dL
Nitrite: POSITIVE — AB
PH: 5.5 (ref 5.0–8.0)
Protein, ur: 30 mg/dL — AB
Specific Gravity, Urine: 1.018 (ref 1.005–1.030)
Urobilinogen, UA: 0.2 mg/dL (ref 0.0–1.0)

## 2014-06-09 LAB — URINE MICROSCOPIC-ADD ON

## 2014-06-09 MED ORDER — AMOXICILLIN 250 MG PO CAPS
250.0000 mg | ORAL_CAPSULE | Freq: Three times a day (TID) | ORAL | Status: DC
  Administered 2014-06-09 – 2014-06-11 (×7): 250 mg via ORAL
  Filled 2014-06-09 (×10): qty 1

## 2014-06-09 NOTE — Progress Notes (Signed)
Occupational Therapy Session Note  Patient Details  Name: Carolyn CourtGeraldine Full MRN: [id number] Date of Birth: 1945/12/05  Today's Date: 06/09/2014 OT Individual Time: 1000-1100 and 1530-1600 OT Individual Time Calculation (min): 60 min and 30 min     Short Term Goals: Week 1:  OT Short Term Goal 1 (Week 1): Pt will be able to sit to EOB with mod A. OT Short Term Goal 2 (Week 1): Pt will maintain static sitting EOB with mod A. OT Short Term Goal 3 (Week 1): Pt will demonstrate increased awareness of RUE to wash it with min A. OT Short Term Goal 4 (Week 1): Pt will don shirt with max A. OT Short Term Goal 5 (Week 1): Pt will roll in bed with mod A to assist caregivers with changing her LB clothing.  Skilled Therapeutic Interventions/Progress Updates:    Session 1: Pt seen for 1:1 OT session with focus on postural control, sitting balance, sit<>stand, functional transfers, attention to R, coordination, and overall cognitive remediation. Pt received supine in bed oriented to place with min cues. Completed supine>sit wit HOB flat and max A +2. Pt with strong pushing tendencies upon sitting requiring max-total A for sitting balance. Transitioned to propped on L elbow to break pushing tendencies with pt tolerating up to 10 seconds. Pt tearful at times however unable to determine if pain or fear in relation to poor spatial awareness. Pt progressed to sitting EOB with min A for up to 10 seconds. Completed squat pivot transfer bed>w/c with total A +2. Pt required total A to initiate face washing with max cues. Pt completed dressing with max cues for arousal, initiation, and problem solving. Completed sit<>stand 2x with 3 musketeer technique of max A+2 and third person to manage pants. 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 Pt required max multimodal cues for arousal throughout session. Pt left reclined in w/c at nurses station.   Session 2: Pt  seen for 1:1 OT session with focus on postural control, midline orientation, yes/no accuracy, orientation, and activity tolerance. Pt received supine in bed agreeable to sit EOB with mod cues of encouragement. Pt completed supine>sit with total A for initiation and max A to complete. Pt sat EOB for approx 20 min with min-mod A with multimodal cues and manual facilitation for midline orientation. Pt required 2 rest breaks via reclining on therapist during sitting. Utilized hook on wall as visual cue for cervical extension and positioning of head in midline. Engaged in therapeutic conversation with focus on orientation, pt's interests and yes/no accuracy. At end of session pt returned to supine with total A and left with all needs in reach.   Therapy Documentation Precautions:  Precautions Precautions: Fall Precaution Comments: R field cut/R inattention, pushes to R  Restrictions Weight Bearing Restrictions: No General:   Vital Signs:  Pain: Unable to determine due to cognitive linguistic deficits  See FIM for current functional status  Therapy/Group: Individual Therapy  Daneil Danerkinson, Donette Mainwaring N 06/09/2014, 12:24 PM

## 2014-06-09 NOTE — Progress Notes (Signed)
Physical Therapy Session Note  Patient Details  Name: Carolyn CourtGeraldine Bernstein MRN: [id number] Date of Birth: [birth year]  Today's Date: 06/09/2014 PT Individual Time: 1430-1530 PT Individual Time Calculation (min): 60 min   Short Term Goals: Week 1:  PT Short Term Goal 1 (Week 1): Patient will perform bed mobility with max A x 1.  PT Short Term Goal 2 (Week 1): Patient will perform bed <> wheelchair transfer with mod A x 2.  PT Short Term Goal 3 (Week 1): Patient will ambulate 10 ft with +2 assist and wheelchair follow.  PT Short Term Goal 4 (Week 1): Patient will initiate stair training.  PT Short Term Goal 5 (Week 1): Patient will perform dynamic standing balance x 2 min with +2 assist.   Skilled Therapeutic Interventions/Progress Updates:    Neuromuscular Reeducation: PT attempts neural faciliation through R UE PNF D1 flexion/extension pattern x 10 reps - pt completely flaccid and only PROM completed.  PT places R leg and instructs pt in L leg placement to attempt bridges (neurologic overflow techniqe), and even spreads a sheet on the floor and demonstrates what a bridge is, but pt is unable to cognitively comprehend this exercise and unable to attempt it, despite tactile & verbal cues from PT.  PT instructs pt in hip IR/adduction in B hook lie (PT assists R leg/foot stability) - Pt's R leg is completely flaccid and only PROM completed, including when PT resists pt's L knee into IR, attempting to achieve neurologic overflow.   Therapeutic Activity: PT instructs pt in rolling R in flat bed with bedrail req min A, progressing to SBA with verbal cues only - blocked practice for repetition to encourage learning: x 10 reps. PT instructs pt in rolling L in flat bed with bedrail req mod, progressing to min A once R leg/arm pre-positioned, while PT uses draw sheet to assist pt at the hips.  PT instructs pt in scooting up towards HOB in flat bed, req pre-placement of L arm on bedrail to assist, and  pre-placement of legs in hook lie position x 10 reps req mod-max A, but pt demonstrates the beginnings of bridging during this activity.   Therapeutic Exercise: PT instructs pt in L side UE/LE ROM and strengthening exercises: bicep curls, arm "punches" to shoulder height, and PNF D1 flexion pattern - all with 5# dumbell; SLR, side lie hip abduction, and PNF D1 flexion to L LE - all with 5# ankle weight on: x 10 reps each.   Pt is unable to follow commands well for manual resistance, but appears to participate better with functional tasks and arom exercises. Continue per PT POC.   Therapy Documentation Precautions:  Precautions Precautions: Fall Precaution Comments: R field cut/R inattention, pushes to R  Restrictions Weight Bearing Restrictions: No Pain: Pain Assessment Pain Assessment: No/denies pain   See FIM for current functional status  Therapy/Group: Individual Therapy  Megumi Treaster M 06/09/2014, 8:59 AM

## 2014-06-09 NOTE — Progress Notes (Signed)
Social Work Patient ID: Carolyn PhilipsGeraldine [name], female   DOB: 12-Jan-1946, 69 y.o.   MRN: [id number]   Spoke with daughter today to review team conference.  She is aware and agreeable with targeted d/c date of 6/9 and agrees that she is seeing good progress so far.  Denies any questions at this point.  She is hopeful that this next week will also show measurable improvement.  Continue to follow for support.  Ignatz Deis, LCSW

## 2014-06-09 NOTE — Progress Notes (Signed)
Social Work Patient ID: Carolyn PhilipsGeraldine [name], female   DOB: September 04, 1945, 69 y.o.   MRN: [id number]   Carolyn PancoastLucy R Tylar Amborn, LCSW Social Worker Addendum  Patient Care Conference 06/08/2014  8:06 PM    Expand All Collapse All   Inpatient RehabilitationTeam Conference and Plan of Care Update Date: 06/08/2014   Time: 3:05 PM     Patient Name: Carolyn [name]       Medical Record Number: [id number]  Date of Birth: September 04, 1945 Sex: Female         Room/Bed: 4W11C/4W11C-01 Payor Info: Payor: MEDICARE / Plan: MEDICARE PART A AND B / Product Type: *No Product type* /    Admitting Diagnosis: NON TRAUMATIC ICH   Admit Date/Time:  06/03/2014  3:45 PM Admission Comments: No comment available   Primary Diagnosis:  Nontraumatic hemorrhage of cerebral hemisphere Principal Problem: Nontraumatic hemorrhage of cerebral hemisphere    Patient Active Problem List     Diagnosis  Date Noted   .  Embolic stroke     .  HLD (hyperlipidemia)     .  Aphasia     .  Right hemiplegia     .  Endotracheal tube present     .  Stroke     .  Embolic cerebral infarction  78/29/562105/04/2014   .  Acute respiratory failure, unspecified whether with hypoxia or hypercapnia     .  Acute respiratory failure     .  Bradycardia  05/23/2014   .  Cerebral brain hemorrhage     .  Respiratory failure     .  Junctional bradycardia     .  Arterial hypotension     .  Nontraumatic hemorrhage of cerebral hemisphere  05/20/2014   .  Hypertensive emergency  05/20/2014     Expected Discharge Date: Expected Discharge Date: 07/01/14  Team Members Present: Physician leading conference: Dr. Faith RogueZachary [name] Social Worker Present: Carolyn JupiterLucy Demitrios Molyneux, LCSW Nurse Present: Carolyn EndAngie Joyce, RN PT Present: Carolyn [name], PT OT Present: Carolyn [name], Carolyn [name];Carolyn [name], OT SLP Present: Carolyn [name], SLP PPS Coordinator present : Carolyn DuckMarie Noel, RN, CRRN        Current Status/Progress  Goal  Weekly Team Focus   Medical     left ICH, dense right HP, aphasia  improve  functional mobility and language   bladder, pain, language, nutrition    Bowel/Bladder     Incontinent bowel and bladder  Continent with timed toileting  Timed toileting q 2-3 hours W/A.   Swallow/Nutrition/ Hydration     Dys. 2 textures with thin liquids, Mod-Max A for use of swallow strategies  Min A  trials of Dys. 3 textures    ADL's     total A +2 for all bathing, dressing, toileting and transfers   min A overall - will continue to assess if need to downgrade   activty tolerance, visual scanning, attention, functional transfers,    Mobility     min-total A sitting balance, max A x 2 sit <> stand, max-total A x 2 squat pivot and slide board transfers, max-total to +2 bed mobility, pushes to R, fatigues quickly   mod-max A overall  sitting balance, midline orientation, decreasing pusher tendencies, functional transfers, standing tolerance, family education    Communication     Max-Total A  Mod A  naming, object identification, yes/no accuracy, voicing    Safety/Cognition/ Behavioral Observations    Max A  Min-Mod A  sustained attention, attention to right field of enviornment, awareness, problem  solving    Pain     Non-verbal s/s headache; Tylenol effective but not holding   Managed at goal 2/10  Observe for non-verbal s/s pain.   Skin     Incision line C.D.I., no s/s infection.   Resolving at D/C , no s/s infection, breakdown   Control incontinence; barrier cream to buttocks with MGP 'paste'    Rehab Goals Patient on target to meet rehab goals: Yes *See Care Plan and progress notes for long and short-term goals.    Barriers to Discharge:  profound neuro deficits     Possible Resolutions to Barriers:   nmr, cognitive linguisitc education, family ed, secondary venue of care     Discharge Planning/Teaching Needs:   pt most likely to d/c to SNF       Team Discussion:    Some language improvements already. Able to hold information between sessions.  Improved posturally and  attention to the right.  Better voicing.  Very apraxic.  Strong pushing.  Team pleased overall with improvements made this first week.  SW reports family slowly developing an understanding of deficits and best plans for d/c (which will likely be SNF).   Revisions to Treatment Plan:    None    Continued Need for Acute Rehabilitation Level of Care: The patient requires daily medical management by a physician with specialized training in physical medicine and rehabilitation for the following conditions: Daily direction of a multidisciplinary physical rehabilitation program to ensure safe treatment while eliciting the highest outcome that is of practical value to the patient.: Yes Daily medical management of patient stability for increased activity during participation in an intensive rehabilitation regime.: Yes Daily analysis of laboratory values and/or radiology reports with any subsequent need for medication adjustment of medical intervention for : Neurological problems;Carolyn [name]  Carolyn [name] 06/09/2014, 12:57 PM        Revision History      Date/Time User Provider Type Action    06/09/2014 12:58 PM Carolyn PancoastLucy R Carolyn [name] Signer, LCSW Social Worker Addend    06/09/2014 12:58 PM Carolyn PancoastLucy R Carolyn Blanchfield, LCSW Social Worker Incomplete Revision    06/08/2014  8:09 PM Carolyn PancoastLucy R Carolyn Lewey, LCSW Social Worker Sign    View Details Report

## 2014-06-09 NOTE — Progress Notes (Signed)
Physical Therapy Session Note  Patient Details  Name: Carolyn [name] MRN: [id number] Date of Birth: Feb 04, 1945  Today's Date: 06/09/2014 PT Co-Treatment Time: 1300 (co-tx with SLP)-1330 PT Co-Treatment Time Calculation (min): 30 min  Short Term Goals: Week 1:  PT Short Term Goal 1 (Week 1): Patient will perform bed mobility with max A x 1.  PT Short Term Goal 2 (Week 1): Patient will perform bed <> wheelchair transfer with mod A x 2.  PT Short Term Goal 3 (Week 1): Patient will ambulate 10 ft with +2 assist and wheelchair follow.  PT Short Term Goal 4 (Week 1): Patient will initiate stair training.  PT Short Term Goal 5 (Week 1): Patient will perform dynamic standing balance x 2 min with +2 assist.   Skilled Therapeutic Interventions/Progress Updates:   Skilled co-treatment with SLP to address arousal, attention, sitting balance, functional transfers, midline orientation, decreasing pushing tendencies, and cognitive-linguistic goals. Patient appeared fatigued at beginning of session as she had been up in TIS wheelchair since previous therapy session. Performed squat pivot transfers to R and L with max-total A x 2, cues to keep LUE in lap or around therapist to decrease pushing to R. Performed sitting balance with BLE supported and wedge under R hip, LUE frequently repositioned in patient's lap or on therapist's LE to decrease heavy pushing to R, initially requiring max multimodal cues to maintain sitting balance with reaching faded to min A at end of session. Patient demonstrated LOB forward and backward with delayed balance reaction with posterior LOB. Patient performed reaching task to facilitate weight shift to L with focus on naming and following 1 step directions. Performed sit <> stand x 4 from mat with max-total A x 2, therapist under RUE and providing downward approximation RLE and SLP providing L HHA. Patient with initial delay in coming to standing keeping knees and trunk flexed,  requiring max multimodal cueing to come to full stand. Patient able to verbalize that she does not have pain but is fearful of standing. Patient returned to bed with max-total A x 2 and left semi reclined with bed alarm on and needs within reach.   Therapy Documentation Precautions:  Precautions Precautions: Fall Precaution Comments: R field cut/R inattention, pushes to R  Restrictions Weight Bearing Restrictions: No Pain: Pain Assessment Pain Assessment: No/denies pain  See FIM for current functional status  Therapy/Group: Individual Therapy  Kerney ElbeVarner, Brailey Buescher A 06/09/2014, 2:01 PM

## 2014-06-09 NOTE — Progress Notes (Signed)
[facility] PHYSICAL MEDICINE & REHABILITATION     PROGRESS NOTE    Subjective/Complaints: States she rested ok. Denies pain this morning.  ROS limited due to severe aphasia  Objective: Vital Signs: Blood pressure 132/55, pulse 78, temperature 97.7 F (36.5 C), temperature source Oral, resp. rate 18, weight 87.5 kg (192 lb 14.4 oz), SpO2 98 %. No results found. No results for input(s): WBC, HGB, HCT, PLT in the last 72 hours. No results for input(s): NA, K, CL, GLUCOSE, BUN, CREATININE, CALCIUM in the last 72 hours.  Invalid input(s): CO CBG (last 3)  No results for input(s): GLUCAP in the last 72 hours.  Wt Readings from Last 3 Encounters:  06/03/14 87.5 kg (192 lb 14.4 oz)  06/03/14 90.4 kg (199 lb 4.7 oz)    Physical Exam:  HENT:  Craniotomy site clean and dry  Eyes:  Pupils reactive to light without nystagmus  Neck: Normal range of motion. Neck supple. No thyromegaly present.  Cardiovascular:  Cardiac rate controlled, regular, no murmur  Respiratory:  clear to auscultation  GI: Soft. Bowel sounds are normal. She exhibits no distension.  Neurological:  Patient alert.  Follows basic commands.  expressive greater than receptive aphasia--offering one word answers to simple questions at least 50% of time---improving reliability with yes/no answers. 0/5 RUE and RLE.  early flexor tone in RUE. Spontaneous movement of left arm and leg.. Right HH. Pinch on RUE and RLE elicits withdrawl Psychiatric: alert, appears in good spirits  Assessment/Plan: 1. 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 Physiatrist is providing close team supervision and 24 hour management of active medical problems listed below. Physiatrist and rehab team continue to assess barriers to discharge/monitor patient progress toward functional and medical goals. FIM: FIM  - Bathing Bathing Steps Patient Completed: Chest, Abdomen Bathing: 1: Total-Patient completes 0-2 of 10 parts or less than 25%  FIM - Upper Body Dressing/Undressing Upper body dressing/undressing steps patient completed: Put head through opening of pull over shirt/dress Upper body dressing/undressing: 2: Max-Patient completed 25-49% of tasks FIM - Lower Body Dressing/Undressing Lower body dressing/undressing: 1: Two helpers  FIM - Toileting Toileting: 1: Two helpers  FIM - ArchivistToilet Transfers Toilet Transfers: 1-Two helpers  FIM - BankerBed/Chair Transfer Bed/Chair Transfer Assistive Devices: [employer] InsuranceHOB elevated Bed/Chair Transfer: 1: Two helpers  FIM - Locomotion: Wheelchair Distance: 5 ft Locomotion: Wheelchair: 2: Travels 50 - 149 ft with maximal assistance (Pt: 25 - 49%) FIM - Locomotion: Ambulation Locomotion: Ambulation Assistive Devices: Other (comment) (3 musketeers) Ambulation/Gait Assistance: 1: +2 Total assist Locomotion: Ambulation: 0: Activity did not occur  Comprehension Comprehension Mode: Auditory Comprehension: 2-Understands basic 25 - 49% of the time/requires cueing 51 - 75% of the time  Expression Expression Mode: Verbal Expression: 2-Expresses basic 25 - 49% of the time/requires cueing 50 - 75% of the time. Uses single words/gestures.  Social Interaction Social Interaction: 2-Interacts appropriately 25 - 49% of time - Needs frequent redirection.  Problem Solving Problem Solving: 2-Solves basic 25 - 49% of the time - needs direction more than half the time to initiate, plan or complete simple activities  Memory Memory: 1-Recognizes or recalls less than 25% of the time/requires cueing greater than 75% of the time  Medical Problem List and Plan: 1. 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 DVT Prophylaxis/Anticoagulation: Subcutaneous heparin initiated 05/24/2014. Monitor for any bleeding episodes 3. Pain  Management: Tylenol now scheduled TID. Continue  warm/cold compresses for head prn 4. Seizure prophylaxis. Keppra 500 mg twice a day. EEG negative 5. Neuropsych: This patient is not capable of making decisions on her own behalf. 6. Skin/Wound Care: Routine skin checks 7. Fluids/Electrolytes/Nutrition: Follow i's and o's, po intake, check labs.  8. Dysphagia. Dysphagia #2 nectar liquids. Encourage PO 9. MRSA PCR screening positive. Contact precautions 10. Hypertension. Toprol-XL 25 mg daily. Continue to check per shift. No changes to regimen at present 11. Hypothyroidism. Synthroid 12. Urine: UA +, cx pending  -begin empirix amoxil    LOS (Days) 6 A FACE TO FACE EVALUATION WAS PERFORMED  SWARTZ,ZACHARY T 06/09/2014 7:46 AM

## 2014-06-09 NOTE — Progress Notes (Signed)
Speech Language Pathology Daily Session Note  Patient Details  Name: [name] CourtGeraldine Biagini MRN: [id number] Date of Birth: 1945/11/14  Today's Date: 06/09/2014 SLP Co-Treatment Time: 1330 (co-tc with PT(RV) 1300-1400)-1400 SLP Co-Treatment Time Calculation (min): 30 min  Short Term Goals: Week 1: SLP Short Term Goal 1 (Week 1): Patient will consume current diet without overt s/s of aspiration with Mod A verbal cues for use of swallow strategies.  SLP Short Term Goal 2 (Week 1): Patient will scan to midline with Mod A multimodal cues during functional and familiar tasks. SLP Short Term Goal 3 (Week 1): Patient will demonstrate sustained attention to functional and familiar tasks for 5 minutes with Mod A verbal cues for redirection.  SLP Short Term Goal 4 (Week 1): Patient will identify functional items from a field of 2 with 75% accuracy with Mod A multimodal cues.  SLP Short Term Goal 5 (Week 1): Patient will name functional items with Max A multimodal cues with 25% accuracy.  SLP Short Term Goal 6 (Week 1): Patient will phonate on command with Max A multimodal cues in 25% of opportunities .   Skilled Therapeutic Interventions: Co-treatment with PT focused on arousal, attention, sitting balance and cognitive-linguistic goals.  Upon arrival, patient was upright in wheelchair and appeared fatigued.  Patient transferred to mat with focus on sitting balance, naming and following 1 step directions.  Patient initially required Max A multimodal cues to maintain sitting balance during reaching tasks which faded to Min A by end of session. Patient name functional items and answered basic questions in regards to wants/needs with 90% accuracy when given a choice from a field of 2. Patient was oriented to place, time and situation when given choices with 100% accuracy and was able to complete automatic sequences (counting, DOW, MOY) with extra time and Min-Max A multimodal cues.  Patient also utilized an increased  vocal intensity with 50% of opportunities with Max A multimodal cues. Patient transferred back to bed at end of session. Patient left supine with bed alarm on and all needs within reach. Continue with current plan of care.     FIM:  Comprehension Comprehension Mode: Auditory Comprehension: 2-Understands basic 25 - 49% of the time/requires cueing 51 - 75% of the time Expression Expression Mode: Verbal Expression: 2-Expresses basic 25 - 49% of the time/requires cueing 50 - 75% of the time. Uses single words/gestures. Social Interaction Social Interaction: 2-Interacts appropriately 25 - 49% of time - Needs frequent redirection. Problem Solving Problem Solving: 2-Solves basic 25 - 49% of the time - needs direction more than half the time to initiate, plan or complete simple activities Memory Memory: 2-Recognizes or recalls 25 - 49% of the time/requires cueing 51 - 75% of the time  Pain Pain Assessment Pain Assessment: No/denies pain  Therapy/Group: Individual Therapy  Timothea Bodenheimer 06/09/2014, 4:35 PM

## 2014-06-10 ENCOUNTER — Inpatient Hospital Stay (HOSPITAL_COMMUNITY): Payer: BLUE CROSS/BLUE SHIELD | Admitting: Speech Pathology

## 2014-06-10 ENCOUNTER — Inpatient Hospital Stay (HOSPITAL_COMMUNITY): Payer: BLUE CROSS/BLUE SHIELD | Admitting: Occupational Therapy

## 2014-06-10 ENCOUNTER — Inpatient Hospital Stay (HOSPITAL_COMMUNITY): Payer: BLUE CROSS/BLUE SHIELD

## 2014-06-10 NOTE — Progress Notes (Signed)
Occupational Therapy Session Note  Patient Details  Name: Carolyn [name] MRN: [id number] Date of Birth: [birth year]  Today's Date: 06/10/2014 OT Individual Time: [id number] OT Individual Time Calculation (min): 65 min    Short Term Goals: Week 1:  OT Short Term Goal 1 (Week 1): Pt will be able to sit to EOB with mod A. OT Short Term Goal 2 (Week 1): Pt will maintain static sitting EOB with mod A. OT Short Term Goal 3 (Week 1): Pt will demonstrate increased awareness of RUE to wash it with min A. OT Short Term Goal 4 (Week 1): Pt will don shirt with max A. OT Short Term Goal 5 (Week 1): Pt will roll in bed with mod A to assist caregivers with changing her LB clothing.  Skilled Therapeutic Interventions/Progress Updates:    Engaged in ADL retraining with focus on sitting balance, postural control, sit <> stand, functional transfers, and attention to Rt.  Pt supine in bed required max assist +2 for bed mobility and supine to sit.  Upon sitting at EOB, pt with strong pushing with LUE requiring max assist to maintain sitting balance.  Max multimodal cues to reposition to propped on Lt elbow to decrease pushing.  Slide board transfer bed > w/c with max assist +2 with over the back technique to promote forward weight shift.  UB dressing completed seated at sink with pt able to attend and sequence dressing after therapist donned Rt sleeve.  Pt with no clean pants, however engaged in sit > stand with +2 with max assist with use of mirror to attempt to provide visual input to promote upright standing balance.  In sitting and standing pt with lean to Rt, requiring max cues for orientation.  Pt setup for breakfast with cues to scan tray to locate items, left with nurse tech to provide full supervision for self-feeding.  Therapy Documentation Precautions:  Precautions Precautions: Fall Precaution Comments: R field cut/R inattention, pushes to R  Restrictions Weight Bearing Restrictions: No General:    Vital Signs: Therapy Vitals Temp: 98 F (36.7 C) Temp Source: Oral Pulse Rate: 82 Resp: 18 BP: (!) 135/58 mmHg Patient Position (if appropriate): Lying Oxygen Therapy SpO2: 98 % O2 Device: Not Delivered Pain:  Pt with no c/o pain ADL: ADL ADL Comments: Refer to FIM  See FIM for current functional status  Therapy/Group: Individual Therapy  Rosalio LoudHOXIE, [name] 06/10/2014, 9:18 AM

## 2014-06-10 NOTE — Progress Notes (Signed)
Physical Therapy Session Note  Patient Details  Name: Carolyn [name] MRN: [id number] Date of Birth: 11-29-1945  Today's Date: 06/10/2014 PT Individual Time: 0905-1000 PT Individual Time Calculation (min): 55 min  Pt missed 5 min due to extreme fatigue.   Short Term Goals: Week 1:  PT Short Term Goal 1 (Week 1): Patient will perform bed mobility with max A x 1.  PT Short Term Goal 2 (Week 1): Patient will perform bed <> wheelchair transfer with mod A x 2.  PT Short Term Goal 3 (Week 1): Patient will ambulate 10 ft with +2 assist and wheelchair follow.  PT Short Term Goal 4 (Week 1): Patient will initiate stair training.  PT Short Term Goal 5 (Week 1): Patient will perform dynamic standing balance x 2 min with +2 assist.   Skilled Therapeutic Interventions/Progress Updates:   Pt appeared exhausted at start of session but participated to the best of her ability.   tx focused on transfers, neuro re-ed, midline orientation, RLE facilitation  SBT +2 assist tilt in space w/c > mat to L .  Scooting backwards on mat with +2 assist.  neuromuscular re-education via VCs, manual cues, visual cues for: -in sitting, reaching L within BOS L hand, rotating trunk and crossing midline to R with tactile cues to turn head and place cone on target to R, x 5 x 2 -in sitting, L lateral leans >< midline upright sitting, focusing on functional movements without pushing past midline -passive stretching R heel cord in supine - R hip ext facilitation in supine; flicker felt by PT after repetition and bil movements, with  "push" VC  Propped supine on wedge> sitting with mod assist after set up for slight roll to L, pushing up with LUE. Sit> supine +2 assist for bil LEs and trunk control for rest before start of OT next session.     With phonemic cues during repetitions of movements, pt counted aloud 1-15, x 2.  She answered several yes/no questions accurately, and smiled and laughed appropriately throughout  session.  Therapy Documentation Precautions:  Precautions Precautions: Fall Precaution Comments: R field cut/R inattention, pushes to R  Restrictions Weight Bearing Restrictions: No Pain: Pain Assessment Pain Assessment: No/denies pain      See FIM for current functional status  Therapy/Group: Individual Therapy  Doxie Augenstein 06/10/2014, 5:33 PM

## 2014-06-10 NOTE — Progress Notes (Signed)
Occupational Therapy Session Note  Patient Details  Name: Carolyn CourtGeraldine Geesey MRN: [id number] Date of Birth: [birth year]  Today's Date: 06/10/2014 OT Individual Time: 1001-1111 OT Individual Time Calculation (min): 70 min    Short Term Goals: Week 1:  OT Short Term Goal 1 (Week 1): Pt will be able to sit to EOB with mod A. OT Short Term Goal 2 (Week 1): Pt will maintain static sitting EOB with mod A. OT Short Term Goal 3 (Week 1): Pt will demonstrate increased awareness of RUE to wash it with min A. OT Short Term Goal 4 (Week 1): Pt will don shirt with max A. OT Short Term Goal 5 (Week 1): Pt will roll in bed with mod A to assist caregivers with changing her LB clothing.  Skilled Therapeutic Interventions/Progress Updates:    Pt seen for 1:1 OT session with focus on NMR, postural control, sitting balance, activity tolerance, functional transfers, cognitive remediation, visual scanning, and attention to R. Pt received supine on mat table following PT session. Pt oriented to month and place with use of yes/no questions and 100% accuracy. Sat EOM with min-max A static sitting balance EOM secondary to pushing with LUE. Utilized [name] Plus Lift with +3 assist. Pt required manual facilitation for anterior pelvic tilt and trunk extension to achieve midline and upright posture. Pt required verbal cues and occasional min A for orienting head to midline. Pt stood 2x for approx 10-15 seconds. Engaged in therapeutic conversation with focus on identifying colors and objects while scanning L>R with mod-max cues. 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 Provided PROM to RUE with pt noted to have slight tone in R shoulder and elbow. Pt completed squat pivot transfer to L with mod A +2 overall and pt initiating transfer. Returned to room and indicated need to toilet via gestures. Pt required total A +3 (3rd person to stabilize chair) for squat pivot transfer w/c<>toilet and for  toilet hygiene. Pt left reclined in w/c with daughter present. Of note pt with improved activity tolerance and overall engagement in therapy session.   Therapy Documentation Precautions:  Precautions Precautions: Fall Precaution Comments: R field cut/R inattention, pushes to R  Restrictions Weight Bearing Restrictions: No General:   Vital Signs:  Pain: Pain Assessment Pain Assessment: No/denies pain  See FIM for current functional status  Therapy/Group: Individual Therapy  Daneil Danerkinson, Elsworth Ledin N 06/10/2014, 12:30 PM

## 2014-06-10 NOTE — Progress Notes (Signed)
Speech Language Pathology Weekly Progress and Session Note  Patient Details  Name: Carolyn [name] MRN: [id number] Date of Birth: [birth year]  Beginning of progress report period: Jun 03, 2014 End of progress report period: Jun 10, 2014  Today's Date: 06/10/2014 SLP Individual Time: 1330-1430 SLP Individual Time Calculation (min): 60 min  Short Term Goals: Week 1: SLP Short Term Goal 1 (Week 1): Patient will consume current diet without overt s/s of aspiration with Mod A verbal cues for use of swallow strategies.  SLP Short Term Goal 1 - Progress (Week 1): Met SLP Short Term Goal 2 (Week 1): Patient will scan to midline with Mod A multimodal cues during functional and familiar tasks. SLP Short Term Goal 2 - Progress (Week 1): Met SLP Short Term Goal 3 (Week 1): Patient will demonstrate sustained attention to functional and familiar tasks for 5 minutes with Mod A verbal cues for redirection.  SLP Short Term Goal 3 - Progress (Week 1): Met SLP Short Term Goal 4 (Week 1): Patient will identify functional items from a field of 2 with 75% accuracy with Mod A multimodal cues.  SLP Short Term Goal 4 - Progress (Week 1): Met SLP Short Term Goal 5 (Week 1): Patient will name functional items with Max A multimodal cues with 25% accuracy.  SLP Short Term Goal 5 - Progress (Week 1): Met SLP Short Term Goal 6 (Week 1): Patient will phonate on command with Max A multimodal cues in 25% of opportunities .  SLP Short Term Goal 6 - Progress (Week 1): Met    New Short Term Goals: Week 2: SLP Short Term Goal 1 (Week 2): Patient will consume current diet without overt s/s of aspiration with Min A verbal cues for use of swallow strategies.  SLP Short Term Goal 2 (Week 2): Patient will scan to midline with Mod A verbal cues during functional and familiar tasks. SLP Short Term Goal 3 (Week 2): Patient will demonstrate sustained attention to functional and familiar tasks for 5 minutes with Min A verbal cues for  redirection.  SLP Short Term Goal 4 (Week 2): Patient will name functional items with Max A phonemic cues with 25% accuracy.  SLP Short Term Goal 5 (Week 2): Patient will phonate on command with Max A verbal cues in 25% of opportunities .  SLP Short Term Goal 6 (Week 2): Patient will identify functional items from a field of 2 with 75% accuracy with Mod A verbal cues.   Weekly Progress Updates:  Pt made functional gains this reporting period and has met 6 out of 6 short term goals.  Pt currently requires mod-max assist multimodal cues for basic cognitive-linguistic tasks for functional communication.  Pt continues to present with decreased coordination of respiration with onset of phonation resulting in dysphonia during structured and nonstructured speech tasks.  Pt also presents with a global aphasia with expressive impairments more impaired than receptive.  Pt can follow basic commands, answer basic yes/no questions in a functional context, and receptively identify items from a field of 2 with mod assist multimodal cues.  Pt can name basic objects and phonate on command with max assist multimodal cues.  Pt is currently consuming dys 2 textures and thin liquids with full supervision for use of swallowing precautions to minimize s/s of aspiration and buccal residue left in the oral cavity post swallow.  Pt would continue to benefit from skilled ST while inpatient in order to maximize functional independence and reduce burden of care prior  to discharge.  Anticipate that pt will need 24/7 supervision, assist for med and financial management, and ST follow up at next level of care.  Pt and family education is ongoing.     Intensity: Minumum of 1-2 x/day, 30 to 90 minutes Frequency: 1 to 3 out of 7 days Duration/Length of Stay: 4 weeks Treatment/Interventions: Cognitive remediation/compensation;Cueing hierarchy;Environmental controls;Dysphagia/aspiration precaution training;Functional tasks;Internal/external  aids;Patient/family education;Speech/Language facilitation;Therapeutic Activities   Daily Session  Skilled Therapeutic Interventions: Pt was seen for skilled ST targeting cognitive-linguistic goals.  Upon arrival, pt was asleep in bed but was easily awakened to voice and light touch.  Pt was transferred to wheelchair with nurse tech to maximize attention and alertness during structured therapeutic tasks.  In a quiet environment, pt matched picture to object from a field of three with max assist multimodal cuing for ~25% accuracy.  SLP facilitated the session with skilled diaphragmatic breathing exercises with manipulatives to redirect airflow through the upper airway in order to coordinate respiration with onset of phonation.  Pt required max assist instructional cues to return demonstration of the abovementioned exercises.  Following completion of exercises, pt was able to increase vocal intensity and achieve vocalization on command with max assist multimodal cues.  Pt left upright in wheelchair at nursing station with quick release belt donned.  Goals updated on this date to reflect current progress and plan of care.         FIM:  Comprehension Comprehension Mode: Auditory Comprehension: 3-Understands basic 50 - 74% of the time/requires cueing 25 - 50%  of the time Expression Expression Mode: Verbal Expression: 2-Expresses basic 25 - 49% of the time/requires cueing 50 - 75% of the time. Uses single words/gestures. Social Interaction Social Interaction: 2-Interacts appropriately 25 - 49% of time - Needs frequent redirection. Problem Solving Problem Solving: 2-Solves basic 25 - 49% of the time - needs direction more than half the time to initiate, plan or complete simple activities Memory Memory: 2-Recognizes or recalls 25 - 49% of the time/requires cueing 51 - 75% of the time FIM - Eating Eating Activity: 5: Supervision/cues;4: Helper checks for pocketed food General    Pain Pain  Assessment Pain Assessment: No/denies pain  Therapy/Group: Individual Therapy  Maudry Zeidan, Selinda Orion 06/10/2014, 4:30 PM

## 2014-06-10 NOTE — Progress Notes (Signed)
[facility] PHYSICAL MEDICINE & REHABILITATION     PROGRESS NOTE    Subjective/Complaints: Slept well.  ROS limited due to severe aphasia  Objective: Vital Signs: Blood pressure 135/58, pulse 82, temperature 98 F (36.7 C), temperature source Oral, resp. rate 18, weight 83.6 kg (184 lb 4.9 oz), SpO2 98 %. No results found. No results for input(s): WBC, HGB, HCT, PLT in the last 72 hours. No results for input(s): NA, K, CL, GLUCOSE, BUN, CREATININE, CALCIUM in the last 72 hours.  Invalid input(s): CO CBG (last 3)  No results for input(s): GLUCAP in the last 72 hours.  Wt Readings from Last 3 Encounters:  06/09/14 83.6 kg (184 lb 4.9 oz)  06/03/14 90.4 kg (199 lb 4.7 oz)    Physical Exam:  HENT:  Craniotomy site clean and dry  Eyes:  Pupils reactive to light without nystagmus  Neck: Normal range of motion. Neck supple. No thyromegaly present.  Cardiovascular:  Cardiac rate controlled, regular, no murmur  Respiratory:  clear to auscultation  GI: Soft. Bowel sounds are normal. She exhibits no distension.  Neurological:  Patient alert.  Follows basic commands.  expressive greater than receptive aphasia--offering one word answers to simple questions at least 50% of time---improving reliability with yes/no answers. 0/5 RUE and RLE.  early flexor tone in RUE. Spontaneous movement of left arm and leg.. Right HH. Pinch on RUE and RLE elicits withdrawl Psychiatric: alert, appears in good spirits  Assessment/Plan: 1. 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 Physiatrist is providing close team supervision and 24 hour management of active medical problems listed below. Physiatrist and rehab team continue to assess barriers to discharge/monitor patient progress toward functional and medical goals. FIM: FIM - Bathing Bathing Steps Patient  Completed: Chest, Abdomen Bathing: 1: Total-Patient completes 0-2 of 10 parts or less than 25%  FIM - Upper Body Dressing/Undressing Upper body dressing/undressing steps patient completed: Put head through opening of pull over shirt/dress Upper body dressing/undressing: 2: Max-Patient completed 25-49% of tasks FIM - Lower Body Dressing/Undressing Lower body dressing/undressing: 1: Two helpers  FIM - Toileting Toileting: 1: Two helpers  FIM - ArchivistToilet Transfers Toilet Transfers: 1-Two helpers  FIM - BankerBed/Chair Transfer Bed/Chair Transfer Assistive Devices: Nationwide Mutual InsuranceHOB elevated Bed/Chair Transfer: 1: Two helpers  FIM - Locomotion: Wheelchair Distance: 5 ft Locomotion: Wheelchair: 1: Total Assistance/staff pushes wheelchair (Pt<25%) FIM - Locomotion: Ambulation Locomotion: Ambulation Assistive Devices: Other (comment) (3 musketeers) Ambulation/Gait Assistance: 1: +2 Total assist Locomotion: Ambulation: 0: Activity did not occur  Comprehension Comprehension Mode: Auditory Comprehension: 2-Understands basic 25 - 49% of the time/requires cueing 51 - 75% of the time  Expression Expression Mode: Verbal Expression: 2-Expresses basic 25 - 49% of the time/requires cueing 50 - 75% of the time. Uses single words/gestures.  Social Interaction Social Interaction: 2-Interacts appropriately 25 - 49% of time - Needs frequent redirection.  Problem Solving Problem Solving: 2-Solves basic 25 - 49% of the time - needs direction more than half the time to initiate, plan or complete simple activities  Memory Memory: 2-Recognizes or recalls 25 - 49% of the time/requires cueing 51 - 75% of the time  Medical Problem List and Plan: 1. 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 DVT Prophylaxis/Anticoagulation: Subcutaneous heparin initiated 05/24/2014. Monitor for any bleeding episodes 3. Pain Management: Tylenol now scheduled TID. Continue  warm/cold  compresses for head prn 4.  Seizure prophylaxis. Keppra 500 mg twice a day. EEG negative 5. Neuropsych: This patient is not capable of making decisions on her own behalf. 6. Skin/Wound Care: Routine skin checks 7. Fluids/Electrolytes/Nutrition: Follow i's and o's, po intake, check labs.  8. Dysphagia. Dysphagia #2 nectar liquids. Encourage PO 9. MRSA PCR screening positive. Contact precautions 10. Hypertension. Toprol-XL 25 mg daily. Continue to check per shift. No changes to regimen at present 11. Hypothyroidism. Synthroid 12. Urine: UA +, cx with 100k GNR  -began empirix amoxil    LOS (Days) 7 A FACE TO FACE EVALUATION WAS PERFORMED  Shirlie Enck T 06/10/2014 7:48 AM

## 2014-06-11 ENCOUNTER — Inpatient Hospital Stay (HOSPITAL_COMMUNITY): Payer: BLUE CROSS/BLUE SHIELD | Admitting: Physical Therapy

## 2014-06-11 ENCOUNTER — Inpatient Hospital Stay (HOSPITAL_COMMUNITY): Payer: BLUE CROSS/BLUE SHIELD | Admitting: Speech Pathology

## 2014-06-11 ENCOUNTER — Inpatient Hospital Stay (HOSPITAL_COMMUNITY): Payer: BLUE CROSS/BLUE SHIELD

## 2014-06-11 LAB — URINE CULTURE: Colony Count: >=100000

## 2014-06-11 MED ORDER — CIPROFLOXACIN HCL 250 MG PO TABS
250.0000 mg | ORAL_TABLET | Freq: Two times a day (BID) | ORAL | Status: AC
  Administered 2014-06-11 – 2014-06-16 (×10): 250 mg via ORAL
  Filled 2014-06-11 (×11): qty 1

## 2014-06-11 NOTE — Progress Notes (Signed)
Occupational Therapy Weekly Progress Note  Patient Details  Name: Carolyn [name] MRN: [id number] Date of Birth: 05-04-1945  Beginning of progress report period: Jun 04, 2014 End of progress report period: Jun 11, 2014  Today's Date: 06/11/2014 OT Individual Time: 0900-1000 OT Individual Time Calculation (min): 60 min    Patient has met 1 of 5 short term goals.  Patient currently requires max-total A +2 for functional transfers. Patient demonstrated improved initiation and postural control as she completed squa pivot transfer on one occasion with mod A +2. Patient demonstrates poor midline orientation and pushing tendencies with LUE requiring min-max A for static and dynamic sitting balance. Patient demonstrates cervical and trunk flexion in sitting requiring cues and manual facilitation for extension to achieve optimal posture. Nursing staff completing night baths with pt to allow therapy to focus on overall postural control. Patient demonstrates motor apraxia, requiring hand over hand assist to complete grooming and dressing tasks. 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 Patient's greatest barriers at this time are decreased midline orientation, decreased balance, decreased awareness, decreased postural control, and apraxia.   Patient continues to demonstrate the following deficits: R hemiplegia, decreased midline orientation, decreased postural control, decreased sitting and standing balance, decreased core strength, R inattention, decreased awareness, cognitive-linguistic deficits, decreased safety awareness, decreased activity tolerance, decreased strength, decreased coordination, pushing tendencies to R, poor spatial awareness, decreased sensation, oral and motor apraxia, decreased initiation and therefore will continue to benefit from skilled OT intervention to enhance overall performance with BADL, Reduce care partner burden and improve midline  orientation, postural control, balance, attention to R, and strength.  Patient progressing toward long term goals..  Continue plan of care.  OT Short Term Goals Week 1:  OT Short Term Goal 1 (Week 1): Pt will be able to sit to EOB with mod A. OT Short Term Goal 1 - Progress (Week 1): Partly met OT Short Term Goal 2 (Week 1): Pt will maintain static sitting EOB with mod A. OT Short Term Goal 2 - Progress (Week 1): Partly met OT Short Term Goal 3 (Week 1): Pt will demonstrate increased awareness of RUE to wash it with min A. OT Short Term Goal 3 - Progress (Week 1): Not met OT Short Term Goal 4 (Week 1): Pt will don shirt with max A. OT Short Term Goal 4 - Progress (Week 1): Met OT Short Term Goal 5 (Week 1): Pt will roll in bed with mod A to assist caregivers with changing her LB clothing. OT Short Term Goal 5 - Progress (Week 1): Partly met Week 2:  OT Short Term Goal 1 (Week 2): Pt will demonstrate static sitting EOB/EOM for 2 min with min A OT Short Term Goal 2 (Week 2): Pt will demonstrate increased awareness of RUE to wash/dress with mod multimodal cues OT Short Term Goal 3 (Week 2): Pt will consistently complete functional transfer with max A  OT Short Term Goal 4 (Week 2): Pt will complete LB dressing with max A OT Short Term Goal 5 (Week 2): Pt stand for 45 seconds with or without mechanical lift (e.g [name]) to increase activity tolerance and midline orientation  Skilled Therapeutic Interventions/Progress Updates:    Pt seen for ADL retraining with focus on functional transfers, standing balance, midline orientation, activity tolerance, visual scanning, and postural control. Pt received supine in bed verbally acknowledging therapist. Pt disoriented to place and situation stating she was in "[place]." Pt completed rolling in bed to complete  peri hygiene and donning of new brief. Pt required total A rolling to L and min A rolling to R. Pt completed supine>sit with max A+2 then sat EOB  min-mod A for apprx 3 min with decreased pushing noted. Completed squat pivot transfer bed>w/c with max A+2 and mod cues for visually scanning to R to locate w/c. Completed bathing at sink with pt requiring max cues for sequencing secondary to perseveration on washing face. Pt required total A to initiate washing RUE however did wash upper RLE without cues. Pt required total A to initiate hemi dressing technique with pt completing UB dressing with mod A. Completed sit<>stand during LB dressing with max A+2 initially and max A of one (second person present for safety) on second trial. Pt tolerated standing apprx 10 seconds with verbal cues and manual facilitation for upright posture. Pt with improved verbalizations throughout therapy session as she identified 2/3 colors in shirt and 50% of clothing items. At end of session pt left reclined in w/c at nurses station.   Therapy Documentation Precautions:  Precautions Precautions: Fall Precaution Comments: R field cut/R inattention, pushes to R  Restrictions Weight Bearing Restrictions: No General:   Vital Signs: Therapy Vitals Temp: 98.3 F (36.8 C) Temp Source: Oral Pulse Rate: 81 Resp: 18 BP: (!) 144/57 mmHg Patient Position (if appropriate): Lying Oxygen Therapy SpO2: 96 % O2 Device: Not Delivered Pain: No report of pain  See FIM for current functional status  Therapy/Group: Individual Therapy  Duayne Cal 06/11/2014, 7:24 AM

## 2014-06-11 NOTE — Progress Notes (Signed)
[facility] PHYSICAL MEDICINE & REHABILITATION     PROGRESS NOTE    Subjective/Complaints: No issues overnight.  ROS limited due to severe aphasia  Objective: Vital Signs: Blood pressure 144/57, pulse 81, temperature 98.3 F (36.8 C), temperature source Oral, resp. rate 18, weight 83.6 kg (184 lb 4.9 oz), SpO2 96 %. No results found. No results for input(s): WBC, HGB, HCT, PLT in the last 72 hours. No results for input(s): NA, K, CL, GLUCOSE, BUN, CREATININE, CALCIUM in the last 72 hours.  Invalid input(s): CO CBG (last 3)  No results for input(s): GLUCAP in the last 72 hours.  Wt Readings from Last 3 Encounters:  06/09/14 83.6 kg (184 lb 4.9 oz)  06/03/14 90.4 kg (199 lb 4.7 oz)    Physical Exam:  HENT:  Craniotomy site clean and dry  Eyes:  Pupils reactive to light without nystagmus  Neck: Normal range of motion. Neck supple. No thyromegaly present.  Cardiovascular:  Cardiac rate controlled, regular, no murmur  Respiratory:  clear to auscultation bilaterally. No wheezes or rales GI: Soft. Bowel sounds are normal. She exhibits no distension.  Neurological:  Patient alert.  Follows basic commands. continued expressive greater than receptive aphasia--better with Y/N answers/head nods.  0/5 RUE and RLE.  early flexor tone in RUE. Spontaneous movement of left arm and leg.. Right HH. Pinch on RUE and RLE elicits withdrawl Psychiatric: alert, appears in good spirits  Assessment/Plan: 1. 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 Physiatrist is providing close team supervision and 24 hour management of active medical problems listed below. Physiatrist and rehab team continue to assess barriers to discharge/monitor patient progress toward functional and medical goals. FIM: FIM - Bathing Bathing Steps Patient Completed: Chest,  Abdomen Bathing: 1: Total-Patient completes 0-2 of 10 parts or less than 25%  FIM - Upper Body Dressing/Undressing Upper body dressing/undressing steps patient completed: Put head through opening of pull over shirt/dress, Thread/unthread left sleeve of pullover shirt/dress Upper body dressing/undressing: 3: Mod-Patient completed 50-74% of tasks FIM - Lower Body Dressing/Undressing Lower body dressing/undressing: 1: Two helpers  FIM - Hotel managerToileting Toileting Assistive Devices: Grab bar or rail for support Toileting: 1: Two helpers  FIM - ArchivistToilet Transfers Toilet Transfers: 1-Two helpers  FIM - Architectural technologistBed/Chair Transfer Bed/Chair Transfer Assistive Devices: Sliding board Bed/Chair Transfer: 1: Two helpers  FIM - Locomotion: Wheelchair Distance: 5 ft Locomotion: Wheelchair: 1: Total Assistance/staff pushes wheelchair (Pt<25%) FIM - Locomotion: Ambulation Locomotion: Ambulation Assistive Devices: Other (comment) (3 musketeers) Ambulation/Gait Assistance: 1: +2 Total assist Locomotion: Ambulation: 0: Activity did not occur  Comprehension Comprehension Mode: Auditory Comprehension: 3-Understands basic 50 - 74% of the time/requires cueing 25 - 50%  of the time  Expression Expression Mode: Verbal Expression: 2-Expresses basic 25 - 49% of the time/requires cueing 50 - 75% of the time. Uses single words/gestures.  Social Interaction Social Interaction: 2-Interacts appropriately 25 - 49% of time - Needs frequent redirection.  Problem Solving Problem Solving: 2-Solves basic 25 - 49% of the time - needs direction more than half the time to initiate, plan or complete simple activities  Memory Memory: 2-Recognizes or recalls 25 - 49% of the time/requires cueing 51 - 75% of the time  Medical Problem List and Plan: 1. 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 DVT Prophylaxis/Anticoagulation: Subcutaneous heparin initiated 05/24/2014. Monitor  for any bleeding episodes 3. Pain Management: Tylenol  now scheduled TID. Continue  warm/cold compresses for head prn 4. Seizure prophylaxis. Keppra 500 mg twice a day. EEG negative 5. Neuropsych: This patient is not capable of making decisions on her own behalf. 6. Skin/Wound Care: Routine skin checks 7. Fluids/Electrolytes/Nutrition: Follow i's and o's, po intake, check labs.  8. Dysphagia. Dysphagia #2 nectar liquids. Encourage PO 9. MRSA PCR screening positive. Contact precautions 10. Hypertension. Toprol-XL 25 mg daily. Continue to check per shift. No changes to regimen at present 11. Hypothyroidism. Synthroid 12. Urine: UA +, cx with 100k GNR--culture and sens still pending  -continue empirix amoxil    LOS (Days) 8 A FACE TO FACE EVALUATION WAS PERFORMED  Carolyn [name] T 06/11/2014 7:33 AM

## 2014-06-11 NOTE — Progress Notes (Signed)
Physical Therapy Weekly Progress Note  Patient Details  Name: Carolyn [name] MRN: [id number] Date of Birth: [birth year]  Beginning of progress report period: Jun 04, 2014 End of progress report period: Jun 11, 2014  Today's Date: 06/11/2014 PT Individual Time: 1430-1500 PT Individual Time Calculation (min): 30 min   Patient has met 0 of 5 short term goals.  Patient currently requires max-total A x 2 for lateral scoot/squat pivot transfers. Patient with improved initiation and participation in transfer this date from mat to wheelchair to L with mod A and +2 assist to stabilize wheelchair on one occasion. Patient demonstrates decreased midline orientation and pushes to R using LUE, requiring min-max A for static > dynamic sitting balance with improved static balance today with intermittent supervision after therapist placed RUE on mat. Patient is able to stand for no more than 10 sec at a time with +2 assist. Patient with continued significantly impaired activity tolerance and is further limited by decreased attention to R, apraxia, and aphasia.   Patient continues to demonstrate the following deficits: R hemiplegia, muscle weakness, decreased sitting/standing balance, decreased postural control, decreased cardiorespiratory endurance, impaired timing and sequencing, abnormal tone, unbalanced muscle activation, motor apraxia, decreased coordination, decreased motor planning, decreased sensation, field cut, decreased midline orientation, decreased attention to right, decreased initiation, decreased attention, decreased awareness, decreased problem solving, decreased safety awareness, decreased memory, and delayed processing and therefore will continue to benefit from skilled PT intervention to enhance overall performance with activity tolerance, balance, postural control, ability to compensate for deficits, functional use of  right upper extremity and right lower extremity, attention, awareness and  coordination.  Patient progressing toward long term goals. Continue plan of care.  PT Short Term Goals Week 1:  PT Short Term Goal 1 (Week 1): Patient will perform bed mobility with max A x 1.  PT Short Term Goal 1 - Progress (Week 1): Progressing toward goal PT Short Term Goal 2 (Week 1): Patient will perform bed <> wheelchair transfer with mod A x 2.  PT Short Term Goal 2 - Progress (Week 1): Progressing toward goal PT Short Term Goal 3 (Week 1): Patient will ambulate 10 ft with +2 assist and wheelchair follow.  PT Short Term Goal 3 - Progress (Week 1): Not met PT Short Term Goal 4 (Week 1): Patient will initiate stair training.  PT Short Term Goal 4 - Progress (Week 1): Not met PT Short Term Goal 5 (Week 1): Patient will perform dynamic standing balance x 2 min with +2 assist.  PT Short Term Goal 5 - Progress (Week 1): Not met Week 2:  PT Short Term Goal 1 (Week 2): Patient will perform bed mobility with assist of 1 person.  PT Short Term Goal 2 (Week 2): Patient will perform bed <> wheelchair transfer with mod A x 2.  PT Short Term Goal 3 (Week 2): Patient will perform dynamic sitting balance x 3 min with min A.  PT Short Term Goal 4 (Week 2): Patient will tolerate standing x 60 sec with +2 assist.  PT Short Term Goal 5 (Week 2): Patient will propel wheelchair x 50 ft with mod A.   Skilled Therapeutic Interventions/Progress Updates:   Session focused on bed mobility and NMR. Patient incontinent of urine and unaware. Performed multiple trials of rolling to R with min A > supervision using rail and cues to flex LLE and mod A to L for hygiene and donning clean brief. Patient instructed in bridging to assist with repositioning  in bed, therapist placing/stabilizing RLE and providing max multimodal cues but patient unable to perform as she continued to mobilize her head and LUE instead of glutes due to cognitive impairments, apraxia, decreased motor planning, and aphasia. Patient instructed in  hip adduction in hooklying position with therapist stabilizing R foot with max demonstration and tactile cuing, no activation noted. Performed PROM to RLE and LLE with focus on heel cords and hamstrings. Patient left semi reclined in bed with standard call bell and soft touch call bell as patient demonstrated use of call bell with increased time and multiple trials, bed alarm on.   Therapy Documentation Precautions:  Precautions Precautions: Fall Precaution Comments: R field cut/R inattention, pushes to R  Restrictions Weight Bearing Restrictions: No Pain: Pain Assessment Pain Assessment: No/denies pain  See FIM for current functional status  Therapy/Group: Individual Therapy  Laretta Alstrom 06/11/2014, 4:04 PM

## 2014-06-11 NOTE — Progress Notes (Signed)
Physical Therapy Session Note  Patient Details  Name: Carolyn [name] MRN: [id number] Date of Birth: [birth year]  Today's Date: 06/11/2014 PT Co-Treatment Time: 1330 (co-tx with SLP)-1400 PT Co-Treatment Time Calculation (min): 30 min  Short Term Goals: Week 1:  PT Short Term Goal 1 (Week 1): Patient will perform bed mobility with max A x 1.  PT Short Term Goal 2 (Week 1): Patient will perform bed <> wheelchair transfer with mod A x 2.  PT Short Term Goal 3 (Week 1): Patient will ambulate 10 ft with +2 assist and wheelchair follow.  PT Short Term Goal 4 (Week 1): Patient will initiate stair training.  PT Short Term Goal 5 (Week 1): Patient will perform dynamic standing balance x 2 min with +2 assist.   Skilled Therapeutic Interventions/Progress Updates:   Skilled co-treatment with SLP to address cognitive linguistic goals, sitting balance, postural control and mobility, functional transfers, and activity tolerance. See SLP note for details regarding cognitive tasks. Patient sitting in TIS wheelchair, appeared fatigued at beginning of session.  Performed squat pivot transfer to R (wheelchair > mat) with max-total A x 2, to L (mat > wheelchair) with mod A and +2 A to stabilize wheelchair, and to L (wheelchair > bed) with mod A x 2. Patient continues to require cues for safe LUE placement to decrease pusher tendencies with overall improved participation in transfers this session.   Performed sitting balance with BLE supported with significantly decreased pushing to R and improved overall midline orientation, overall supervision-min A with up to total A with LOB. Patient demonstrates significantly impaired and ineffective balance reaction with posterior LOB and absent reaction with anterior and right LOB.   For active rest break, therapist on physioball behind patient facilitating trunk extension, upright posture, and midline head/neck positioning while patient participating in cognitive reaching  task, requiring mod tactile cues to release LUE from mat to initiate reaching.   Patient with increased restlessness and appeared uncomfortable in wheelchair and seated on mat, repositioned with max A scooting and supine on wedge with pillows which appeared to provide some relief. Patient unable to verbalize cause of discomfort with max A multimodal assist.   Patient returned to bed with mod A x 2 and left semi reclined in bed with bed alarm on and needs within reach.   Therapy Documentation Precautions:  Precautions Precautions: Fall Precaution Comments: R field cut/R inattention, pushes to R  Restrictions Weight Bearing Restrictions: No Pain: Pain Assessment Pain Assessment: No/denies pain  See FIM for current functional status  Therapy/Group: Co-Treatment with SLP  Bayard HuggerVarner, Jedidiah Demartini A 06/11/2014, 2:00 PM

## 2014-06-11 NOTE — Progress Notes (Signed)
Speech Language Pathology Daily Session Notes  Patient Details  Name: Carolyn CourtGeraldine Goya MRN: [id number] Date of Birth: 05-03-1945  Today's Date: 06/11/2014  Session 1:SLP Individual Time: 1100-1200 SLP Individual Time Calculation (min): 60 min and Today's Date: 06/11/2014  Session 2: SLP Co-Treatment Time: 1300 (co-tx with PT (RV) 1300-1400)-1330 SLP Co-Treatment Time Calculation (min): 30 min  Short Term Goals: Week 2: SLP Short Term Goal 1 (Week 2): Patient will consume current diet without overt s/s of aspiration with Min A verbal cues for use of swallow strategies.  SLP Short Term Goal 2 (Week 2): Patient will scan to midline with Mod A verbal cues during functional and familiar tasks. SLP Short Term Goal 3 (Week 2): Patient will demonstrate sustained attention to functional and familiar tasks for 5 minutes with Min A verbal cues for redirection.  SLP Short Term Goal 4 (Week 2): Patient will name functional items with Max A phonemic cues with 25% accuracy.  SLP Short Term Goal 5 (Week 2): Patient will phonate on command with Max A verbal cues in 25% of opportunities .  SLP Short Term Goal 6 (Week 2): Patient will identify functional items from a field of 2 with 75% accuracy with Mod A verbal cues.   Skilled Therapeutic Interventions:  Session 1: Skilled treatment session focused on speech and language goals and dysphagia. SLP facilitated session by providing Mod A verbal and visual cues for oral reading and for reading comprehension at the word level in regards to matching word to object from a field of two with 80% accuracy. Patient utilized an increased vocal intensity throughout the entire session and was able to verbalize automatic responses at the phrase level. SLP also facilitated session by providing skilled observation with lunch meal of Dys. 2 textures with thin liquids. Patient did not demonstrate any overt s/s of aspiration but required Mod verbal cues for use of small bites/sips  and to clear right buccal pocketing. Patient left upright in wheelchair with daughter present. Continue with current plan of care.   Session 2: Skilled Co-treatment with PT focused on arousal, attention, sitting balance and cognitive-linguistic goals. Upon arrival, patient was upright in wheelchair and appeared fatigued. Patient transferred to mat with focus on sitting balance and auditory comprehension.See PT note in regards to sitting balance. Patient required Max A to identify numbers and colors from a field of two and to follow 1 step commands in regards to reaching for a specific color/number from a field of two. Suspect function impacted by fatigue and discomfort. Patient appeared uncomfortable throughout session but was unable to verbalize where despite Max A multimodal cues. Patient utilized her voice throughout session with Mod I. Patient transferred back to bed at end of session. Patient left supine with bed alarm on and all needs within reach. Continue with current plan of care.    FIM:  Comprehension Comprehension Mode: Auditory Comprehension: 3-Understands basic 50 - 74% of the time/requires cueing 25 - 50%  of the time Expression Expression Mode: Verbal Expression: 2-Expresses basic 25 - 49% of the time/requires cueing 50 - 75% of the time. Uses single words/gestures. Social Interaction Social Interaction: 3-Interacts appropriately 50 - 74% of the time - May be physically or verbally inappropriate. Problem Solving Problem Solving: 2-Solves basic 25 - 49% of the time - needs direction more than half the time to initiate, plan or complete simple activities Memory Memory: 2-Recognizes or recalls 25 - 49% of the time/requires cueing 51 - 75% of the time FIM -  Eating Eating Activity: 5: Supervision/cues;4: Helper checks for pocketed food;5: Set-up assist for open containers  Pain Pain Assessment Pain Assessment: No/denies pain  Therapy/Group: Individual Therapy  Zyree Traynham,  Raphael Fitzpatrick 06/11/2014, 5:19 PM

## 2014-06-12 ENCOUNTER — Inpatient Hospital Stay (HOSPITAL_COMMUNITY): Payer: BLUE CROSS/BLUE SHIELD | Admitting: Speech Pathology

## 2014-06-12 ENCOUNTER — Encounter (HOSPITAL_COMMUNITY): Payer: BLUE CROSS/BLUE SHIELD | Admitting: Occupational Therapy

## 2014-06-12 NOTE — Progress Notes (Signed)
Occupational Therapy Session Note  Patient Details  Name: [name] CourtGeraldine Zweber MRN: [id number] Date of Birth: Aug 05, 1945  Today's Date: 06/12/2014 OT Individual Time: 1000-1100 OT Individual Time Calculation (min): 60 min    Short Term Goals: Week 2:  OT Short Term Goal 1 (Week 2): Pt will demonstrate static sitting EOB/EOM for 2 min with min A OT Short Term Goal 2 (Week 2): Pt will demonstrate increased awareness of RUE to wash/dress with mod multimodal cues OT Short Term Goal 3 (Week 2): Pt will consistently complete functional transfer with max A  OT Short Term Goal 4 (Week 2): Pt will complete LB dressing with max A OT Short Term Goal 5 (Week 2): Pt stand for 45 seconds with or without mechanical lift (e.g [name]) to increase activity tolerance and midline orientation  Skilled Therapeutic Interventions/Progress Updates:    1:1 Pt in bed and reporting stomach discomfort.  Transitioned to EOB with max A with +2 for balance. Pt able maintain sitting balance with min A EOB once she found midline with multimodal cuing. Used [name] plus to come into standing and transfer standing into roll in shower chair with +2 for safety. Pt initiated sit to stand with tactile cues with mod A. Pt able to maintain midline posture in [name] initially with mod A on the right but could transition to midline requiring min A. Pt with increased vocalizations and answering personal questions and questions related to ADL. Pt reported once she got to roll in shower chair she had to go the bathroom - she was holding her BM for fear of an accident. Reviewed how to call staff for needs. Pt's discomfort dissipated after using the bathroom and was more social. Transitioned into shower. Pt continues to demonstrate significant right body inattention and apraxia with tasks; requiring hand over hand assist to find right side and follow through with bathing parts. Continued focus on hemi dressing technique with HOH assist with extra time and  multimodal cuing. Pt able to correct supported sitting balance with mod cuing. Transitioned back into tilt in space w/c with [name] PLUS to thread pants. Performed sit to stand three musketeer style for pulling up pants with max A +2 .  Pt demonstrated increased ability to extend trunk having upright posture.Pt reclined back at end of session to rest and left at RN station for her needs to be meet and for safety.    Therapy Documentation Precautions:  Precautions Precautions: Fall Precaution Comments: R field cut/R inattention, pushes to R  Restrictions Weight Bearing Restrictions: No Pain: Pain Assessment Pain Assessment: No/denies pain ADL: ADL ADL Comments: Refer to FIM  See FIM for current functional status  Therapy/Group: Individual Therapy  Roney MansSmith, [facility] 06/12/2014, 12:15 PM

## 2014-06-12 NOTE — Progress Notes (Signed)
Speech Language Pathology Daily Session Note  Patient Details  Name: Carolyn [name] MRN: [id number] Date of Birth: September 29, 1945  Today's Date: 06/12/2014 SLP Individual Time: 1430-1500 SLP Individual Time Calculation (min): 30 min  Short Term Goals: Week 2: SLP Short Term Goal 1 (Week 2): Patient will consume current diet without overt s/s of aspiration with Min A verbal cues for use of swallow strategies.  SLP Short Term Goal 2 (Week 2): Patient will scan to midline with Mod A verbal cues during functional and familiar tasks. SLP Short Term Goal 3 (Week 2): Patient will demonstrate sustained attention to functional and familiar tasks for 5 minutes with Min A verbal cues for redirection.  SLP Short Term Goal 4 (Week 2): Patient will name functional items with Max A phonemic cues with 25% accuracy.  SLP Short Term Goal 5 (Week 2): Patient will phonate on command with Max A verbal cues in 25% of opportunities .  SLP Short Term Goal 6 (Week 2): Patient will identify functional items from a field of 2 with 75% accuracy with Mod A verbal cues.   Skilled Therapeutic Interventions:  Pt was seen for skilled ST targeting cognitive-linguistic goals.  Pt was received from nursing station, seated upright in wheelchair, awake, lethargic but agreeable to participate in ST.  Pt completed automatic sequences and structured singing tasks for >75% accuracy with mod-max assist verbal and visual cues to monitor and correct verbal errors.  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  Pt was noted with significantly improved phonation during both automatic sequences and structured naming practice.  Pt is making good progress towards goals.  Pt indicated that she wanted to get back into bed at the end of today's session.  Pt was transferred to bed with maxi move left and nurse tech assistance.  Pt left in bed with RN and nurse  tech present.  Continue per current plan of care.    FIM:  Comprehension Comprehension Mode: Auditory Comprehension: 4-Understands basic 75 - 89% of the time/requires cueing 10 - 24% of the time Expression Expression Mode: Verbal;Nonverbal Expression: 2-Expresses basic 25 - 49% of the time/requires cueing 50 - 75% of the time. Uses single words/gestures. Social Interaction Social Interaction: 4-Interacts appropriately 75 - 89% of the time - Needs redirection for appropriate language or to initiate interaction. Problem Solving Problem Solving: 2-Solves basic 25 - 49% of the time - needs direction more than half the time to initiate, plan or complete simple activities Memory Memory: 2-Recognizes or recalls 25 - 49% of the time/requires cueing 51 - 75% of the time FIM - Eating Eating Activity: 5: Supervision/cues  Pain Pain Assessment Pain Assessment: No/denies pain  Therapy/Group: Individual Therapy  Jamol Ginyard, [name] 06/12/2014, 4:44 PM

## 2014-06-12 NOTE — Progress Notes (Signed)
Patient ID: Carolyn [name], female   DOB: 12-12-1945, 69 y.o.   MRN: [id number]    [place] [facility]     PROGRESS NOTE   06/12/14.  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  Hospital course complicated by hypertensive urgency and acute respiratory failure  Past Medical History  Diagnosis Date  . Hypertension   . AAA (abdominal aortic aneurysm)     a. s/p stent grafting @ [facility].  . Hemorrhagic cerebrovascular accident (CVA)     a. 05/20/2014 L hemispheric hemorrhagic CVA s/p decompressive craniotomy and evacuation.  . H/O echocardiogram     a. 04/2014 Echo: EF 65-70%, Gr 1 DD, mild LVH.     Objective: Vital Signs: Blood pressure 117/57, pulse 85, temperature 98.2 F (36.8 C), temperature source Oral, resp. rate 17, weight 184 lb 4.9 oz (83.6 kg), SpO2 99 %. No results found. No results for input(s): WBC, HGB, HCT, PLT in the last 72 hours. No results for input(s): NA, K, CL, GLUCOSE, BUN, CREATININE, CALCIUM in the last 72 hours.  Invalid input(s): CO   Patient Vitals for the past 24 hrs:  BP Temp Temp src Pulse Resp SpO2  06/12/14 0550 (!) 117/57 mmHg 98.2 F (36.8 C) Oral 85 17 99 %  06/11/14 2130 (!) 129/59 mmHg - - 83 - -  06/11/14 1400 138/64 mmHg 98.1 F (36.7 C) Oral 81 18 97 %     Intake/Output Summary (Last 24 hours) at 06/12/14 0841 Last data filed at 06/12/14 0615  Gross per 24 hour  Intake    300 ml  Output      0 ml  Net    300 ml       Wt Readings from Last 3 Encounters:  06/09/14 184 lb 4.9 oz (83.6 kg)  06/03/14 199 lb 4.7 oz (90.4 kg)    Physical Exam:  HENT:  Craniotomy site clean and dry  Eyes:  Pupils reactive to light without nystagmus  Neck: Normal range of motion. Neck supple. No thyromegaly present.  Cardiovascular:  Cardiac rate controlled, regular, no murmur  Respiratory:   clear to auscultation bilaterally. No wheezes or rales GI: Soft. Bowel sounds are normal. She exhibits no distension.  Neurological:  Patient alert.  Follows basic commands.  Dense right hemiparesis with Right HH.  Psychiatric: alert, appears in good spirits  Assessment/Plan: 1. Functional deficits secondary to left fronto-parietal ICH s/p craniotomy 2. DVT Prophylaxis/Anticoagulation: Subcutaneous heparin initiated 05/24/2014. Monitor for any bleeding episodes  4. Seizure prophylaxis. Keppra 500 mg twice a day. EEG negative  8. Dysphagia. Dysphagia #2 nectar liquids. Encourage PO  10. Hypertension. Toprol-XL 25 mg daily. Continue to check per shift. No changes to regimen at present 11. Hypothyroidism. Synthroid 12. Urine: UA +, cx with 100k GNR--culture revealed Enterobacter and enterococcus.  Will complete Cipro therapy    LOS (Days) 9 A FACE TO FACE EVALUATION WAS PERFORMED  Rogelia BogaKWIATKOWSKI,PETER FRANK 06/12/2014 8:39 AM

## 2014-06-13 ENCOUNTER — Inpatient Hospital Stay (HOSPITAL_COMMUNITY): Payer: BLUE CROSS/BLUE SHIELD | Admitting: Physical Therapy

## 2014-06-13 NOTE — Progress Notes (Signed)
Patient ID: [name] CourtGeraldine Wysong, female   DOB: 03-14-1945, 69 y.o.   MRN: [id number]  Patient ID: [name] CourtGeraldine Loewenstein, female   DOB: 03-14-1945, 69 y.o.   MRN: [id number]    [place] [facility]IL[facility]     PROGRESS NOTE   06/13/14.  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  Hospital course complicated by hypertensive urgency and acute respiratory failure.  Alert, comfortable night without concerns or complaints.  Receives nighttime IV infusions  Past Medical History  Diagnosis Date  . Hypertension   . AAA (abdominal aortic aneurysm)     a. s/p stent grafting @ [facility].  . Hemorrhagic cerebrovascular accident (CVA)     a. 05/20/2014 L hemispheric hemorrhagic CVA s/p decompressive craniotomy and evacuation.  . H/O echocardiogram     a. 04/2014 Echo: EF 65-70%, Gr 1 DD, mild LVH.     Objective: Vital Signs: Blood pressure 157/64, pulse 78, temperature 98 F (36.7 C), temperature source Oral, resp. rate 18, weight 184 lb 4.9 oz (83.6 kg), SpO2 97 %. No results found. No results for input(s): WBC, HGB, HCT, PLT in the last 72 hours. No results for input(s): NA, K, CL, GLUCOSE, BUN, CREATININE, CALCIUM in the last 72 hours.  Invalid input(s): CO   Patient Vitals for the past 24 hrs:  BP Temp Temp src Pulse Resp SpO2  06/13/14 0644 (!) 157/64 mmHg 98 F (36.7 C) Oral 78 18 97 %  06/12/14 2050 122/60 mmHg - - 78 - -  06/12/14 1512 (!) 118/54 mmHg - - 89 - -     Intake/Output Summary (Last 24 hours) at 06/13/14 0813 Last data filed at 06/12/14 1700  Gross per 24 hour  Intake    600 ml  Output      0 ml  Net    600 ml       Wt Readings from Last 3 Encounters:  06/09/14 184 lb 4.9 oz (83.6 kg)  06/03/14 199 lb 4.7 oz (90.4 kg)    Physical Exam:  HENT:  Craniotomy site clean and dry  Eyes:  Pupils reactive to light without nystagmus   Neck: Normal range of motion. Neck supple. No thyromegaly present.  Cardiovascular:  Cardiac rate controlled, regular, no murmur  Respiratory:  clear to auscultation bilaterally. No wheezes or rales GI: Soft. Bowel sounds are normal. She exhibits no distension.  Neurological:  Patient alert.  Follows basic commands.  Dense right hemiparesis with Right HH.  Extremities-  IV line, left arm  Psychiatric: alert, appears in good spirits  Assessment/Plan: 1. Functional deficits secondary to left fronto-parietal ICH s/p craniotomy 2. DVT Prophylaxis/Anticoagulation: Subcutaneous heparin initiated 05/24/2014. Monitor for any bleeding episodes  4. Seizure prophylaxis. Keppra 500 mg twice a day. EEG negative  8. Dysphagia. Dysphagia #2 nectar liquids. Encourage PO  10. Hypertension. Toprol-XL 25 mg daily. Continue to check per shift. No changes to regimen at present 11. Hypothyroidism. Synthroid 12. Urine: UA +, cx with 100k GNR--culture revealed Enterobacter and enterococcus.  Will complete Cipro therapy    LOS (Days) 10 A FACE TO FACE EVALUATION WAS PERFORMED  Rogelia BogaKWIATKOWSKI,Lakethia Coppess FRANK 06/13/2014 8:13 AM

## 2014-06-13 NOTE — Progress Notes (Signed)
Physical Therapy Session Note  Patient Details  Name: Carolyn [name] MRN: [id number] Date of Birth: September 05, 1945  Today's Date: 06/13/2014 PT Individual Time: 1000-1100 PT Individual Time Calculation (min): 60 min   Short Term Goals: Week 2:  PT Short Term Goal 1 (Week 2): Patient will perform bed mobility with assist of 1 person.  PT Short Term Goal 2 (Week 2): Patient will perform bed <> wheelchair transfer with mod A x 2.  PT Short Term Goal 3 (Week 2): Patient will perform dynamic sitting balance x 3 min with min A.  PT Short Term Goal 4 (Week 2): Patient will tolerate standing x 60 sec with +2 assist.  PT Short Term Goal 5 (Week 2): Patient will propel wheelchair x 50 ft with mod A.   Skilled Therapeutic Interventions/Progress Updates:   Session focused on static > dynamic sitting balance, midline orientation, functional transfers, and standing tolerance.  Transfer training: Supine > sit with HOB slightly elevated and use of bed rail with mod-max A overall and increased active participation from patient, mod verbal/visual cues for technique. Squat pivot transfers to R via Bobath technique with total A x 1 and +2A to stabilize wheelchair and to left with mod A x 1 and +2 assist to stabilize wheelchair, HOH assist to reach for opposite arm rest with LUE.   Sitting balance with BLE supported and mirror for visual feedback: Static > dynamic sitting balance with close supervision-max A due to LOB/pushing to R, backwards and forwards. Progressed sitting balance to sitting on compliant surface (dynadisc) to decrease stability, patient with improved balance when cued to place LUE on rehab tech's lap to facilitate weight shift to L. Focus on balance strategies facilitated by verbal/tactile cuing with patient demonstrating significantly delayed reaction of putting LUE on mat. At end of session patient able to tolerate static sitting on firm surface x 4 min with supervision with distraction of watching  other people in gym and maintaining LUE placement on mat.   Standing tolerance: Sit <> stand x 6 from edge of mat with RUE around therapist's shoulder and L HHA from rehab tech, mod-max A x 2 and able to tolerate standing between 5 sec and 60 sec. Instructed patient in counting to "30" to distract from task due to increased emotional lability throughout session, especially in standing. Patient continues to report no pain but grimaces due to c/o fear of standing.   Patient left sitting in TIS wheelchair with quick release belt on at RN station, RN notified of patient position.   Therapy Documentation Precautions:  Precautions Precautions: Fall Precaution Comments: R field cut/R inattention, pushes to R  Restrictions Weight Bearing Restrictions: No Pain: Pain Assessment Pain Assessment: No/denies pain  See FIM for current functional status  Therapy/Group: Individual Therapy  Kerney ElbeVarner, Pegi Milazzo A 06/13/2014, 10:59 AM

## 2014-06-14 ENCOUNTER — Inpatient Hospital Stay (HOSPITAL_COMMUNITY): Payer: BLUE CROSS/BLUE SHIELD | Admitting: Physical Therapy

## 2014-06-14 ENCOUNTER — Inpatient Hospital Stay (HOSPITAL_COMMUNITY): Payer: BLUE CROSS/BLUE SHIELD | Admitting: Speech Pathology

## 2014-06-14 ENCOUNTER — Inpatient Hospital Stay (HOSPITAL_COMMUNITY): Payer: BLUE CROSS/BLUE SHIELD

## 2014-06-14 ENCOUNTER — Inpatient Hospital Stay (HOSPITAL_COMMUNITY): Payer: BLUE CROSS/BLUE SHIELD | Admitting: Occupational Therapy

## 2014-06-14 NOTE — Progress Notes (Signed)
[place] [facility] MEDICINE & RE[facility]ABILITATION     PROGRESS NOTE    Subjective/Complaints: No complaints. Slept well. Language improving ROS limited due to aphasia but better  Objective: Vital Signs: Blood pressure 141/71, pulse 76, temperature 98.6 F (37 C), temperature source Oral, resp. rate 17, weight 83.6 kg (184 lb 4.9 oz), SpO2 97 %. No results found. No results for input(s): WBC, HGB, HCT, PLT in the last 72 hours. No results for input(s): NA, K, CL, GLUCOSE, BUN, CREATININE, CALCIUM in the last 72 hours.  Invalid input(s): CO CBG (last 3)  No results for input(s): GLUCAP in the last 72 hours.  Wt Readings from Last 3 Encounters:  06/09/14 83.6 kg (184 lb 4.9 oz)  06/03/14 90.4 kg (199 lb 4.7 oz)    Physical Exam:  HENT:  Craniotomy site clean and dry  Eyes:  Pupils reactive to light without nystagmus  Neck: Normal range of motion. Neck supple. No thyromegaly present.  Cardiovascular:  Cardiac rate controlled, regular, no murmur  Respiratory:  clear to auscultation bilaterally. No wheezes or rales GI: Soft. Bowel sounds are normal. She exhibits no distension.  Neurological:  Patient alert.  Follows basic commands. Now speaking in words and short phrases with reasonable accuracy. Has difficulty with higher level conversation and problem solving.  0/5 RUE and RLE.  early flexor tone in RUE. Spontaneous movement of left arm and leg.. Right HH. Pinch on RUE and RLE elicits withdrawl Psychiatric: alert, appears in good spirits  Assessment/Plan: 1. 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 Physiatrist is providing close team supervision and 24 hour management of active medical problems listed below. Physiatrist and rehab team continue to assess barriers to discharge/monitor patient progress toward functional and medical  goals. FIM: FIM - Bathing Bathing Steps Patient Completed: Chest, Left Arm, Left upper leg, Right upper leg (completed peri hygiene at bed level) Bathing: 2: Max-Patient completes 3-4 13f 10 parts or 25-49%  FIM - Upper Body Dressing/Undressing Upper body dressing/undressing steps patient completed: Thread/unthread left sleeve of pullover shirt/dress, Put head through opening of pull over shirt/dress Upper body dressing/undressing: 3: Mod-Patient completed 50-74% of tasks FIM - Lower Body Dressing/Undressing Lower body dressing/undressing: 1: Two helpers  FIM - Hotel manager Devices: Grab bar or rail for support Toileting: 1: Two helpers  FIM - Archivist Transfers: 1-Two helpers, 1-Mechanical lift  FIM - Banker Devices: Bed rails, Arm rests Bed/Chair Transfer: 1: Mechanical lift  FIM - Locomotion: Wheelchair Distance: 5 ft Locomotion: Wheelchair: 1: Total Assistance/staff pushes wheelchair (Pt<25%) FIM - Locomotion: Ambulation Locomotion: Ambulation Assistive Devices: Other (comment) (3 musketeers) Ambulation/Gait Assistance: 1: +2 Total assist Locomotion: Ambulation: 0: Activity did not occur  Comprehension Comprehension Mode: Auditory Comprehension: 5-Understands basic 90% of the time/requires cueing < 10% of the time  Expression Expression Mode: Verbal Expression: 2-Expresses basic 25 - 49% of the time/requires cueing 50 - 75% of the time. Uses single words/gestures.  Social Interaction Social Interaction: 4-Interacts appropriately 75 - 89% of the time - Needs redirection for appropriate language or to initiate interaction.  Problem Solving Problem Solving: 2-Solves basic 25 - 49% of the time - needs direction more than half the time to initiate, plan or complete simple activities  Memory Memory: 3-Recognizes or recalls 50 - 74% of the time/requires cueing 25 - 49% of the time  Medical Problem  List  and Plan: 1. 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 DVT Prophylaxis/Anticoagulation: Subcutaneous heparin initiated 05/24/2014. Monitor for any bleeding episodes 3. Pain Management: Tylenol now scheduled TID. Continue  warm/cold compresses for head prn 4. Seizure prophylaxis. Keppra 500 mg twice a day. EEG negative 5. Neuropsych: This patient is not capable of making decisions on her own behalf. 6. Skin/Wound Care: Routine skin checks 7. Fluids/Electrolytes/Nutrition: Follow i's and o's, po intake, check labs.  8. Dysphagia. Dysphagia #2 nectar liquids. Encourage PO 9. MRSA PCR screening positive. Contact precautions 10. Hypertension. Toprol-XL 25 mg daily. Continue to check per shift. No changes to regimen at present 11. Hypothyroidism. Synthroid 12. Urine: UA +, cx with 100k enterococcus and enterobacter  -changed to cipro over weekend---continue for 5 days total    LOS (Days) 11 A FACE TO FACE EVALUATION WAS PERFORMED  Kenyada Hy T 06/14/2014 7:33 AM

## 2014-06-14 NOTE — Progress Notes (Signed)
Occupational Therapy Session Note  Patient Details  Name: [name] CourtGeraldine Abbey MRN: [id number] Date of Birth: 04/16/1945  Today's Date: 06/14/2014 OT Individual Time: 1305-1400 OT Individual Time Calculation (min): 55 min    Short Term Goals: Week 2:  OT Short Term Goal 1 (Week 2): Pt will demonstrate static sitting EOB/EOM for 2 min with min A OT Short Term Goal 2 (Week 2): Pt will demonstrate increased awareness of RUE to wash/dress with mod multimodal cues OT Short Term Goal 3 (Week 2): Pt will consistently complete functional transfer with max A  OT Short Term Goal 4 (Week 2): Pt will complete LB dressing with max A OT Short Term Goal 5 (Week 2): Pt stand for 45 seconds with or without mechanical lift (e.g [name]) to increase activity tolerance and midline orientation  Skilled Therapeutic Interventions/Progress Updates:    Engaged in therapeutic activity with focus on midline orientation, sitting balance, and attention to Rt body and environment.  Completed squat pivot transfer w/c > therapy mat with total assist +2 with max multimodal cues to decrease pushing.  Utilized Ship brokermirror for visual feedback to promote awareness of midline and when drifting to Rt, with pt requiring max cues to utilize mirror.  Engaged in card activity in sitting on edge of mat with block under feet to promote proprioception and WB through legs in sitting, with therapist facilitating weight shift and correction of posture to promote midline sitting balance.  Utilized cards to promote scanning to Rt environment and then reaching to Lt to facilitate spontaneous correction of sitting balance  Pt required max assist throughout task for sitting balance, maintaining static sitting balance for as much as 30 seconds at a time before drifting to Rt.  As session progressed pt's verbalizations became more incomprehensible as she fatigued.  Slide board transfer back to w/c with over the back to promote forward weight shift with +2 for total  assist.  Therapy Documentation Precautions:  Precautions Precautions: Fall Precaution Comments: R field cut/R inattention, pushes to R  Restrictions Weight Bearing Restrictions: No Pain:  Pt with no c/o pain when asked, but with intermittent wincing with weight shifting to Lt in sitting. ADL: ADL ADL Comments: Refer to FIM  See FIM for current functional status  Therapy/Group: Individual Therapy  Rosalio LoudHOXIE, Taishaun Levels 06/14/2014, 3:21 PM

## 2014-06-14 NOTE — Progress Notes (Addendum)
Physical Therapy Session Note  Patient Details  Name: Carolyn [name] MRN: [id number] Date of Birth: 1945-08-13  Today's Date: 06/14/2014 PT Individual Time: 0800-0900 PT Individual Time Calculation (min): 60 min   Short Term Goals: Week 2:  PT Short Term Goal 1 (Week 2): Patient will perform bed mobility with assist of 1 person.  PT Short Term Goal 2 (Week 2): Patient will perform bed <> wheelchair transfer with mod A x 2.  PT Short Term Goal 3 (Week 2): Patient will perform dynamic sitting balance x 3 min with min A.  PT Short Term Goal 4 (Week 2): Patient will tolerate standing x 60 sec with +2 assist.  PT Short Term Goal 5 (Week 2): Patient will propel wheelchair x 50 ft with mod A.   Skilled Therapeutic Interventions/Progress Updates:   Session focused on functional transfers and sitting balance with functional tasks. Patient received semi reclined in bed and stated need for bathroom. Patient transferred supine > sit with HOB elevated and mod-max A. Performed squat pivot transfers from bed > wheelchair <> mat via Bobath technique to R with max A x 2 and to L with max A x 1 with +2 to stabilize wheelchair. Patient continues to demonstrate pushing to R using LUE when assisting with transfers. Performed toilet transfer from manual wheelchair <> commode with total A x 2 and performed hygiene with total A via forward lean. Patient required mod-max A overall for sitting balance on commode. Patient propelled wheelchair using LUE with mod A x 100 ft, decreased cuing needed for sequencing and initiation. Patient sat edge of mat with BLE supported to consume breakfast meal with min-max A overall for sitting balance due to heavy lean to R. Therapist supervised breakfast meal with no overt s/s of aspiration but patient required mod verbal cues for use of small bites/sips. After finishing breakfast, patient appeared discouraged and became labile and attempted to tell this therapist something but was unable  to complete thought. Therapist comforted patient and provided encouragement on progress. [facility] and left sitting at RN station with quick release belt on.   Therapy Documentation  Precautions:  Precautions Precautions: Fall Precaution Comments: R field cut/R inattention, pushes to R  Restrictions Weight Bearing Restrictions: No Pain: Pain Assessment Pain Score: 0-No pain  See FIM for current functional status  Therapy/Group: Individual Therapy  Kerney ElbeVarner, Srihari Shellhammer A 06/14/2014, 12:45 PM

## 2014-06-14 NOTE — Progress Notes (Signed)
Occupational Therapy Session Note  Patient Details  Name: Carolyn [name] MRN: [id number] Date of Birth: 03/16/1945  Today's Date: 06/14/2014 OT Individual Time: 1000-1107 OT Individual Time Calculation (min): 67 min    Short Term Goals: Week 2:  OT Short Term Goal 1 (Week 2): Pt will demonstrate static sitting EOB/EOM for 2 min with min A OT Short Term Goal 2 (Week 2): Pt will demonstrate increased awareness of RUE to wash/dress with mod multimodal cues OT Short Term Goal 3 (Week 2): Pt will consistently complete functional transfer with max A  OT Short Term Goal 4 (Week 2): Pt will complete LB dressing with max A OT Short Term Goal 5 (Week 2): Pt stand for 45 seconds with or without mechanical lift (e.g [name]) to increase activity tolerance and midline orientation  Skilled Therapeutic Interventions/Progress Updates:    Pt seen for 1:1 OT session with focus on midline orientation, functional transfers, visual scanning, attention to R, sitting balance, postural control, activity tolerance, and cognitive remediation. Pt received sitting in w/c at RN station. Completed grooming task of face washing at sink with max cues for sequencing. During dressing, pt initiated threading RUE into bra, however required total A for awareness of RUE to don shirt. Pt required max A sit<>stand 3x with second person for clothing management. Pt tolerated standing 10-15 seconds each trial. Pt required mod cues for midline orientation during self-care tasks with use of mirror for visual feedback. Pt completed squat pivot transfer with total A+2 w/c>mat table to right. Engaged in dynamic reaching activity to L with focus on midline orientation, R trunk elongation, and decreasing pushing tendencies. Pt required min-mod A for sitting balance during activity. Pt verbalized color of rings she was reaching for with <20% accuracy and max cues. Upon return to room pt verbalizing need to toilet. Completed squat pivot transfer  w/c<>toilet with max A +2. Pt left sitting in w/c in reclined position at RN station.   Therapy Documentation Precautions:  Precautions Precautions: Fall Precaution Comments: R field cut/R inattention, pushes to R  Restrictions Weight Bearing Restrictions: No General:   Vital Signs:  Pain: Pain Assessment Pain Score: 0-No pain Critical Care Pain Observation Tool (CPOT) Facial Expression: Relaxed, neutral ADL: ADL ADL Comments: Refer to FIM Exercises:   Other Treatments:    See FIM for current functional status  Therapy/Group: Individual Therapy  Daneil Danerkinson, Tremar Wickens N 06/14/2014, 12:36 PM

## 2014-06-14 NOTE — Progress Notes (Signed)
Speech Language Pathology Daily Session Note  Patient Details  Name: [name] CourtGeraldine Honda MRN: [id number] Date of Birth: Aug 03, 1945  Today's Date: 06/14/2014 SLP Individual Time: 1400-1500 SLP Individual Time Calculation (min): 60 min  Short Term Goals: Week 2: SLP Short Term Goal 1 (Week 2): Patient will consume current diet without overt s/s of aspiration with Min A verbal cues for use of swallow strategies.  SLP Short Term Goal 2 (Week 2): Patient will scan to midline with Mod A verbal cues during functional and familiar tasks. SLP Short Term Goal 3 (Week 2): Patient will demonstrate sustained attention to functional and familiar tasks for 5 minutes with Min A verbal cues for redirection.  SLP Short Term Goal 4 (Week 2): Patient will name functional items with Max A phonemic cues with 25% accuracy.  SLP Short Term Goal 5 (Week 2): Patient will phonate on command with Max A verbal cues in 25% of opportunities .  SLP Short Term Goal 6 (Week 2): Patient will identify functional items from a field of 2 with 75% accuracy with Mod A verbal cues.   Skilled Therapeutic Interventions: Skilled treatment session focused on cognitive goals. Upon arrival, patient was sitting upright in wheelchair but appeared fatigued and reported she had been sitting up all day. SLP facilitated session by providing total -Max A multimodal cues for auditory comprehension in regards to following 1 step directions with a color matching task.  Patient reported throughout the session, "I don't understand" to all tasks that clinician asked patient to complete. Patient sorted money from a field of 4 with Min A verbal and visual cues but required total A to count it. Patient utilized an appropriate vocal intensity throughout the session and verbalized more spontaneous sentences such as "I am having trouble with my sentences." Patient able to verbally tell this clinician biographical information in regards to names of children, their  occupations and towns they live in with 90% accuracy. Patient also was oriented to time, place and situation with Mod A verbal cues. Patient transferred back to bed at end of session utilizing the [facility].  Patient left with bed alarm on and all needs within reach. Continue with current plan of care.    FIM:  Comprehension Comprehension Mode: Auditory Comprehension: 5-Understands basic 90% of the time/requires cueing < 10% of the time Expression Expression Mode: Verbal Expression: 2-Expresses basic 25 - 49% of the time/requires cueing 50 - 75% of the time. Uses single words/gestures. Social Interaction Social Interaction: 4-Interacts appropriately 75 - 89% of the time - Needs redirection for appropriate language or to initiate interaction. Problem Solving Problem Solving: 2-Solves basic 25 - 49% of the time - needs direction more than half the time to initiate, plan or complete simple activities Memory Memory: 3-Recognizes or recalls 50 - 74% of the time/requires cueing 25 - 49% of the time  Pain Pain Assessment Pain Assessment: No/denies pain  Therapy/Group: Individual Therapy  Nakeda Lebron 06/14/2014, 4:09 PM

## 2014-06-15 ENCOUNTER — Inpatient Hospital Stay (HOSPITAL_COMMUNITY): Payer: BLUE CROSS/BLUE SHIELD

## 2014-06-15 ENCOUNTER — Inpatient Hospital Stay (HOSPITAL_COMMUNITY): Payer: BLUE CROSS/BLUE SHIELD | Admitting: Physical Therapy

## 2014-06-15 ENCOUNTER — Inpatient Hospital Stay (HOSPITAL_COMMUNITY): Payer: BLUE CROSS/BLUE SHIELD | Admitting: *Deleted

## 2014-06-15 ENCOUNTER — Inpatient Hospital Stay (HOSPITAL_COMMUNITY): Payer: BLUE CROSS/BLUE SHIELD | Admitting: Speech Pathology

## 2014-06-15 MED ORDER — SENNOSIDES-DOCUSATE SODIUM 8.6-50 MG PO TABS
1.0000 | ORAL_TABLET | Freq: Two times a day (BID) | ORAL | Status: DC
  Administered 2014-06-15 – 2014-06-16 (×4): 1 via ORAL
  Filled 2014-06-15 (×4): qty 1

## 2014-06-15 NOTE — Progress Notes (Signed)
Speech Language Pathology Daily Session Note  Patient Details  Name: Carolyn [name] MRN: [id number] Date of Birth: 27-May-1945  Today's Date: 06/15/2014 SLP Individual Time: 1330-1400 SLP Individual Time Calculation (min): 30 min  Short Term Goals: Week 2: SLP Short Term Goal 1 (Week 2): Patient will consume current diet without overt s/s of aspiration with Min A verbal cues for use of swallow strategies.  SLP Short Term Goal 2 (Week 2): Patient will scan to midline with Mod A verbal cues during functional and familiar tasks. SLP Short Term Goal 3 (Week 2): Patient will demonstrate sustained attention to functional and familiar tasks for 5 minutes with Min A verbal cues for redirection.  SLP Short Term Goal 4 (Week 2): Patient will name functional items with Max A phonemic cues with 25% accuracy.  SLP Short Term Goal 5 (Week 2): Patient will phonate on command with Max A verbal cues in 25% of opportunities .  SLP Short Term Goal 6 (Week 2): Patient will identify functional items from a field of 2 with 75% accuracy with Mod A verbal cues.   Skilled Therapeutic Interventions: Skilled treatment session focused on cognitive-linguistic goals. SLP facilitated session by providing extra time for patient to express her basic wants/needs. Patient appeared uncomfortable throughout the session and reported pain in her stomach. RN made aware. Patient demonstrated increased spontaneous verbalizations with ~5-6 words per utterance in regards to wants/needs. Patient was also able to verbalize the function of a functional item with Max A semantic cues. Patient's verbal errors consisted primarily of phonemic paraphasias and patient required Max A multimodal cues to self-monitor and correct errors. Patient transferred back to bed at end of session. Patient left supine in bed with alarm on and all needs within reach. Continue with current plan of care.    FIM:  Comprehension Comprehension Mode:  Auditory Comprehension: 4-Understands basic 75 - 89% of the time/requires cueing 10 - 24% of the time Expression Expression Mode: Verbal Expression: 2-Expresses basic 25 - 49% of the time/requires cueing 50 - 75% of the time. Uses single words/gestures. Social Interaction Social Interaction: 4-Interacts appropriately 75 - 89% of the time - Needs redirection for appropriate language or to initiate interaction. Problem Solving Problem Solving: 2-Solves basic 25 - 49% of the time - needs direction more than half the time to initiate, plan or complete simple activities Memory Memory: 3-Recognizes or recalls 50 - 74% of the time/requires cueing 25 - 49% of the time  Pain Pain Assessment Pain Assessment: No/denies pain Pain Score: 0-No pain  Therapy/Group: Individual Therapy  Devri Kreher 06/15/2014, 2:43 PM

## 2014-06-15 NOTE — Progress Notes (Signed)
Occupational Therapy Session Note  Patient Details  Name: Carolyn CourtGeraldine Nettle MRN: [id number] Date of Birth: [birth year]  Today's Date: 06/15/2014 OT Individual Time: 0900-1015 OT Individual Time Calculation (min): 75 min    Short Term Goals: Week 2:  OT Short Term Goal 1 (Week 2): Pt will demonstrate static sitting EOB/EOM for 2 min with min A OT Short Term Goal 2 (Week 2): Pt will demonstrate increased awareness of RUE to wash/dress with mod multimodal cues OT Short Term Goal 3 (Week 2): Pt will consistently complete functional transfer with max A  OT Short Term Goal 4 (Week 2): Pt will complete LB dressing with max A OT Short Term Goal 5 (Week 2): Pt stand for 45 seconds with or without mechanical lift (e.g [name]) to increase activity tolerance and midline orientation  Skilled Therapeutic Interventions/Progress Updates:    Pt engaged in BADL retraining including toilet transfers, toileting, and dressing tasks.  Focus on sitting balance, attention to right, functional transfers, task initiation, safety awareness, sit<>stand, and hemi dressing techniques.  Pt requires min to max A for sitting balance.  Pt exhibits significant pushing to right when sitting and requires various strategies to compensate along with max multimodal cues to correct sitting balance.  Pt responds to minimal tactile input when possible.  Pt required Tot A + 2 for all functional transfers with max multimodal cues for safety and preparation for transfer.  Pt required max multimodal cues to initiate hemi dressing techniques.  Pt c/o abdominal pains and stated she has not been able to have a bowel movement.  RN requested pt be returned to bed for administration of medication for constipation.  Therapy Documentation Precautions:  Precautions Precautions: Fall Precaution Comments: R field cut/R inattention, pushes to R  Restrictions Weight Bearing Restrictions: No  Pain: Pain Assessment Pain Assessment: No/denies  pain ADL: ADL ADL Comments: Refer to FIM See FIM for current functional status  Therapy/Group: Individual Therapy  Rich BraveLanier, Sharissa Brierley Chappell 06/15/2014, 10:16 AM

## 2014-06-15 NOTE — Progress Notes (Signed)
Physical Therapy Session Note  Patient Details  Name: Carolyn CourtGeraldine Muzquiz MRN: [id number] Date of Birth: April 17, 1945  Today's Date: 06/15/2014 PT Individual Time: 1100-1200 PT Individual Time Calculation (min): 60 min   Short Term Goals: Week 2:  PT Short Term Goal 1 (Week 2): Patient will perform bed mobility with assist of 1 person.  PT Short Term Goal 2 (Week 2): Patient will perform bed <> wheelchair transfer with mod A x 2.  PT Short Term Goal 3 (Week 2): Patient will perform dynamic sitting balance x 3 min with min A.  PT Short Term Goal 4 (Week 2): Patient will tolerate standing x 60 sec with +2 assist.  PT Short Term Goal 5 (Week 2): Patient will propel wheelchair x 50 ft with mod A.   Skilled Therapeutic Interventions/Progress Updates:   Session focused on functional mobility training, sitting balance, sit <> stand and standing tolerance, and progression of ambulation. Patient received semi reclined in bed, reporting need for bathroom. Due to time constraints, performed rolling to R with use of rail and min A > S and and to L with mod A for clothing management and bed pan placement. Patient unable to void. Patient transferred supine > sit with use of rail and mod A overall.   Squat pivot transfers wheelchair > bed and wheelchair > mat table to R with mod-max A overall with +2 to stabilize wheelchair, progressed from requiring max A x 2 transferring to R during previous sessions. Sitting balance edge of mat with BLE supported with supervision-min A, improved midline orientation and effective balance reactions with verbal cuing.   Sit <> stand from mat with mod-max A x 1 to toss horseshoes in standing. Patient noted to be incontinent of urine with pants soiled. Performed squat pivot transfer [facility] to [facility] with +2 for safety and performed multiple sit <> stand transfers with max A x 1 for doffing dirty/donning clean pants and brief with total A. Performed stand pivot transfer with +2 A  via 3 musketeers technique from [facility] to mat table.   Initiated gait training with RUE around therapist and L HHA from rehab tech x 7 ft with total A to advance/stabilize RLE, cues for sequencing stepping with LLE and upright posture, +3A for wheelchair follow.  Patient's daughter present for second half of session and provided update on progress with functional mobility training to date.  Patient left sitting in TIS wheelchair with quick release belt on with daughter.   Therapy Documentation Precautions:  Precautions Precautions: Fall Precaution Comments: R field cut/R inattention, pushes to R  Restrictions Weight Bearing Restrictions: No Pain: Pain Assessment Pain Assessment: No/denies pain Locomotion : Ambulation Ambulation/Gait Assistance: 1: +2 Total assist   See FIM for current functional status  Therapy/Group: Individual Therapy  Kerney ElbeVarner, Amilya Haver A 06/15/2014, 12:15 PM

## 2014-06-15 NOTE — Progress Notes (Signed)
[place] PHYSICAL MEDICINE & REHABILITATION     PROGRESS NOTE    Subjective/Complaints: Had another good night. Denies pain. No sob, cough, chest pain ROS limited due to aphasia but better  Objective: Vital Signs: Blood pressure 132/58, pulse 76, temperature 98.4 F (36.9 C), temperature source Oral, resp. rate 18, weight 83.6 kg (184 lb 4.9 oz), SpO2 98 %. No results found. No results for input(s): WBC, HGB, HCT, PLT in the last 72 hours. No results for input(s): NA, K, CL, GLUCOSE, BUN, CREATININE, CALCIUM in the last 72 hours.  Invalid input(s): CO CBG (last 3)  No results for input(s): GLUCAP in the last 72 hours.  Wt Readings from Last 3 Encounters:  06/09/14 83.6 kg (184 lb 4.9 oz)  06/03/14 90.4 kg (199 lb 4.7 oz)    Physical Exam:  HENT:  Craniotomy site clean and dry  Eyes:  Pupils reactive to light without nystagmus  Neck: Normal range of motion. Neck supple. No thyromegaly present.  Cardiovascular:  Cardiac rate controlled, regular, no murmur  Respiratory:  clear to auscultation bilaterally. No wheezes or rales GI: Soft. Bowel sounds are normal. She exhibits no distension.  Neurological:  Patient alert.  Follows basic commands. Now speaking in words and short phrases with reasonable accuracy. Has difficulty with higher level conversation and problem solving.  Remains trace to 0/5 RUE and RLE.  early flexor tone in RUE. Spontaneous movement of left arm and leg.. Right HH. Pinch on RUE and RLE elicits withdrawl Psychiatric: alert, appears in good spirits  Assessment/Plan: 1. 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 Physiatrist is providing close team supervision and 24 hour management of active medical problems listed below. Physiatrist and rehab team continue to assess barriers to discharge/monitor patient progress  toward functional and medical goals. FIM: FIM - Bathing Bathing Steps Patient Completed: Chest, Left Arm, Left upper leg, Right upper leg (completed peri hygiene at bed level) Bathing: 2: Max-Patient completes 3-4 7212f 10 parts or 25-49%  FIM - Upper Body Dressing/Undressing Upper body dressing/undressing steps patient completed: Thread/unthread left sleeve of pullover shirt/dress, Put head through opening of pull over shirt/dress, Thread/unthread left bra strap Upper body dressing/undressing: 2: Max-Patient completed 25-49% of tasks FIM - Lower Body Dressing/Undressing Lower body dressing/undressing: 1: Two helpers  FIM - Hotel managerToileting Toileting Assistive Devices: Grab bar or rail for support Toileting: 1: Two helpers  FIM - ArchivistToilet Transfers Toilet Transfers: 1-Two helpers  FIM - BankerBed/Chair Transfer Bed/Chair Transfer Assistive Devices: Bed rails, Arm rests Bed/Chair Transfer: 1: Mechanical lift  FIM - Locomotion: Wheelchair Distance: 5 ft Locomotion: Wheelchair: 1: Total Assistance/staff pushes wheelchair (Pt<25%) FIM - Locomotion: Ambulation Locomotion: Ambulation Assistive Devices: Other (comment) (3 musketeers) Ambulation/Gait Assistance: 1: +2 Total assist Locomotion: Ambulation: 0: Activity did not occur  Comprehension Comprehension Mode: Auditory Comprehension: 5-Understands basic 90% of the time/requires cueing < 10% of the time  Expression Expression Mode: Verbal Expression: 2-Expresses basic 25 - 49% of the time/requires cueing 50 - 75% of the time. Uses single words/gestures.  Social Interaction Social Interaction: 4-Interacts appropriately 75 - 89% of the time - Needs redirection for appropriate language or to initiate interaction.  Problem Solving Problem Solving: 2-Solves basic 25 - 49% of the time - needs direction more than half the time to initiate, plan or complete simple activities  Memory Memory: 3-Recognizes or recalls 50 - 74% of the time/requires cueing  25 - 49% of the time  Medical Problem List and Plan: 1. 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 DVT Prophylaxis/Anticoagulation: Subcutaneous heparin initiated 05/24/2014. Monitor for any bleeding episodes 3. Pain Management: Tylenol now scheduled TID. Continue  warm/cold compresses for head prn 4. Seizure prophylaxis. Keppra 500 mg twice a day. EEG negative 5. Neuropsych: This patient is not capable of making decisions on her own behalf. 6. Skin/Wound Care: Routine skin checks 7. Fluids/Electrolytes/Nutrition: Follow i's and o's, po intake, check labs.  8. Dysphagia. Dysphagia #2 nectar liquids. Encourage PO 9. MRSA PCR screening positive. Contact precautions 10. Hypertension. Toprol-XL 25 mg daily. Continue to check per shift. No changes to regimen at present 11. Hypothyroidism. Synthroid 12. Urine: UA +, cx with 100k enterococcus and enterobacter  -changed to cipro over weekend---continue for 5 days total    LOS (Days) 12 A FACE TO FACE EVALUATION WAS PERFORMED  SWARTZ,ZACHARY T 06/15/2014 8:11 AM

## 2014-06-15 NOTE — Progress Notes (Signed)
Speech Language Pathology Daily Session Note  Patient Details  Name: Carolyn [name] MRN: [id number] Date of Birth: 1945-10-01  Today's Date: 06/15/2014 SLP Individual Time: 1259-1330 SLP Individual Time Calculation (min): 31 min  Short Term Goals: Week 2: SLP Short Term Goal 1 (Week 2): Patient will consume current diet without overt s/s of aspiration with Min A verbal cues for use of swallow strategies.  SLP Short Term Goal 2 (Week 2): Patient will scan to midline with Mod A verbal cues during functional and familiar tasks. SLP Short Term Goal 3 (Week 2): Patient will demonstrate sustained attention to functional and familiar tasks for 5 minutes with Min A verbal cues for redirection.  SLP Short Term Goal 4 (Week 2): Patient will name functional items with Max A phonemic cues with 25% accuracy.  SLP Short Term Goal 5 (Week 2): Patient will phonate on command with Max A verbal cues in 25% of opportunities .  SLP Short Term Goal 6 (Week 2): Patient will identify functional items from a field of 2 with 75% accuracy with Mod A verbal cues.   Skilled Therapeutic Interventions: Skilled treatment focused on cognitive-linguistic goals. SLP facilitated session with structured tasks targeting expressive and receptive language skills. Pt had 70% accuracy matching written words to common objects with Min cues from therapist. She was 80% accurate matching short phrases to the same common objects with Min cues. Expressively, she did require Max sentence completion and phonemic cues in order to confrontationally name the objects in use. Linguistic errors largely consist of phonemic paraphasias and perseveration. Continue plan of care.   FIM:  Comprehension Comprehension Mode: Auditory Comprehension: 4-Understands basic 75 - 89% of the time/requires cueing 10 - 24% of the time Expression Expression Mode: Verbal Expression: 2-Expresses basic 25 - 49% of the time/requires cueing 50 - 75% of the time. Uses  single words/gestures. Social Interaction Social Interaction: 4-Interacts appropriately 75 - 89% of the time - Needs redirection for appropriate language or to initiate interaction. Problem Solving Problem Solving: 2-Solves basic 25 - 49% of the time - needs direction more than half the time to initiate, plan or complete simple activities Memory Memory: 3-Recognizes or recalls 50 - 74% of the time/requires cueing 25 - 49% of the time  Pain Pain Assessment Pain Assessment: No/denies pain Pain Score: 0-No pain  Therapy/Group: Individual Therapy   Maxcine HamLaura Paiewonsky, M.A. CCC-SLP [phone]  [name], [name] 06/15/2014, 2:02 PM

## 2014-06-16 ENCOUNTER — Inpatient Hospital Stay (HOSPITAL_COMMUNITY): Payer: BLUE CROSS/BLUE SHIELD

## 2014-06-16 ENCOUNTER — Inpatient Hospital Stay (HOSPITAL_COMMUNITY): Payer: BLUE CROSS/BLUE SHIELD | Admitting: Occupational Therapy

## 2014-06-16 ENCOUNTER — Inpatient Hospital Stay (HOSPITAL_COMMUNITY): Payer: BLUE CROSS/BLUE SHIELD | Admitting: Speech Pathology

## 2014-06-16 ENCOUNTER — Inpatient Hospital Stay (HOSPITAL_COMMUNITY): Payer: BLUE CROSS/BLUE SHIELD | Admitting: Physical Therapy

## 2014-06-16 NOTE — Progress Notes (Signed)
Physical Therapy Session Note  Patient Details  Name: Carolyn [name] MRN: [id number] Date of Birth: [birth year]  Today's Date: 06/16/2014 PT Individual Time: 1100-1200 PT Individual Time Calculation (min): 60 min   Short Term Goals: Week 2:  PT Short Term Goal 1 (Week 2): Patient will perform bed mobility with assist of 1 person.  PT Short Term Goal 2 (Week 2): Patient will perform bed <> wheelchair transfer with mod [name] x 2.  PT Short Term Goal 3 (Week 2): Patient will perform dynamic sitting balance x 3 min with min [name].  PT Short Term Goal 4 (Week 2): Patient will tolerate standing x 60 sec with +2 assist.  PT Short Term Goal 5 (Week 2): Patient will propel wheelchair x 50 ft with mod [name].   Skilled Therapeutic Interventions/Progress Updates:   Session focused on sitting balance, NMR, sit <> stand transfers, midline orientation, and ambulation. Patient sitting in TIS wheelchair at RN station. Performed squat pivot transfer to L with total [name] x 1 and +2 to stabilize wheelchair. Patient demonstrated increased pushing to R with transfer compared to yesterday. Performed static > dynamic sitting balance with supervision-min [name] initially and up to max [name] as patient fatigued throughout session. Performed sit <> stand from mat with max [name] x 1 to reach for/place object to target in front of patient. Patient incontinent of urine on floor in spite of having brief donned. Performed multiple sit <> stand transfers with max [name] to doff/don brief and patient performed hygiene in sitting with assist for thoroughness. Patient transferred sit <> supine on flat mat table with max [name]. For R NMR, performed supine bridging with downward approximation applied through RLE and max multimodal cues for technique. Progressed to bridging and hooklying RLE adduction with ball between knees and verbal/tactile cues to keep ball from falling and "squeeze" with therapist stabilizing LLE, manual cues provided to adductors and glutes. Gait  training via 3 musketeers technique with +3 [name] for wheelchair follow x 13 ft with total [name] to advance/stabilize RLE and verbal cues to sequence stepping with LLE. Patient left sitting in wheelchair with quick release belt on at RN station.   Therapy Documentation Precautions:  Precautions Precautions: Fall Precaution Comments: R field cut/R inattention, pushes to R  Restrictions Weight Bearing Restrictions: No Pain: Pain Assessment Pain Assessment: No/denies pain Locomotion : Ambulation Ambulation/Gait Assistance: 1: +2 Total assist   See FIM for current functional status  Therapy/Group: Individual Therapy  Carolyn [name], Carolyn [name] 06/16/2014, 11:51 AM

## 2014-06-16 NOTE — Progress Notes (Signed)
Occupational Therapy Session Note  Patient Details  Name: Carolyn [name] MRN: [id number] Date of Birth: [birth year]  Today's Date: 06/16/2014 OT Individual Time: 0900-1000 OT Individual Time Calculation (min): 60 min    Short Term Goals: Week 2:  OT Short Term Goal 1 (Week 2): Pt will demonstrate static sitting EOB/EOM for 2 min with min A OT Short Term Goal 2 (Week 2): Pt will demonstrate increased awareness of RUE to wash/dress with mod multimodal cues OT Short Term Goal 3 (Week 2): Pt will consistently complete functional transfer with max A  OT Short Term Goal 4 (Week 2): Pt will complete LB dressing with max A OT Short Term Goal 5 (Week 2): Pt stand for 45 seconds with or without mechanical lift (e.g [name]) to increase activity tolerance and midline orientation  Skilled Therapeutic Interventions/Progress Updates:    Pt seen for ADL retraining with focus on midline orientation, functional transfers, standing balance, attention to R, and cognitive remediation. Pt received supine in bed. Completed supine>sit with mod A and cues for sequencing. Pt sat EOB apprx 4 min with min-SBA for sitting balance. Completed squat pivot transfer bed>w/c with max A +2 (second person present to stabilize w/c). Completed bathing at sink with min cues for initiation and max cues for sequencing of task. Pt required mod multimodal cues to wash RUE with focus on visually attending to RUE. Completed dressing with max cues to complete hemi dressing technique. Completed sit<>stand 5x with max A +1-max A+2. Pt stood with RUE over therapist shoulder while holding therapist's hand with LUE for 1 min demonstrating improved postural control and midline orientation. Pt continues to demonstrate improved ability to communicate needs. Pt left reclined in w/c at RN station.   Therapy Documentation Precautions:  Precautions Precautions: Fall Precaution Comments: R field cut/R inattention, pushes to R  Restrictions Weight  Bearing Restrictions: No General:   Vital Signs:  Pain: Pain Assessment Pain Assessment: No/denies pain  See FIM for current functional status  Therapy/Group: Individual Therapy  Daneil Danerkinson, [place] Ferch N 06/16/2014, 12:17 PM

## 2014-06-16 NOTE — Progress Notes (Signed)
Occupational Therapy Session Note  Patient Details  Name: Carolyn [name] MRN: [id number] Date of Birth: 15-Jun-1945  Today's Date: 06/16/2014 OT Individual Time: 1030-1100 OT Individual Time Calculation (min): 30 min     Skilled Therapeutic Interventions/Progress Updates:    1:1 Pt receive in tilt in space w/c. Focus on transfer training from w/c to mat while maintaining a forward lean, trunk control and postural control. Pt able to perform scoot transfer (3 scoots) to the mat towards her left with mod A with +1.  On the mat focused on sit to stands and maintaining standing posture in midline in prep for clothing management in standing with toileting and LB dressing. Pt able to perform sit to stand with +2 with varying level of assistance - min with +2 close to max with +2. Pt able to perform 6 sit to stands. With 2 of the prolonged sit to stands pt able to perform functional task of reaching for objects and moving it to different surface with focus on initiating controlled weight shifts to the right and the left and then returning to midline. Pt able to transfer back into w/c (to the left) with max A +2 (pt 25%). Pt with more difficulty maintaining a weight shift to the right to assist with transfer. Pt taken to RN station in prep for lunch.     Therapy Documentation Precautions:  Precautions Precautions: Fall Precaution Comments: R field cut/R inattention, pushes to R  Restrictions Weight Bearing Restrictions: No Pain: Pain Assessment Pain Assessment: No/denies pain ADL: ADL ADL Comments: Refer to FIM  See FIM for current functional status  Therapy/Group: Individual Therapy  Roney MansSmith, Hortensia Duffin Sanford Bismarckynsey 06/16/2014, 2:21 PM

## 2014-06-16 NOTE — Progress Notes (Signed)
Speech Language Pathology Daily Session Note  Patient Details  Name: Carolyn [name] MRN: [id number] Date of Birth: [birth year]  Today's Date: 06/16/2014 SLP Individual Time: 1300-1400 SLP Individual Time Calculation (min): 60 min  Short Term Goals: Week 2: SLP Short Term Goal 1 (Week 2): Patient will consume current diet without overt s/s of aspiration with Min A verbal cues for use of swallow strategies.  SLP Short Term Goal 2 (Week 2): Patient will scan to midline with Mod A verbal cues during functional and familiar tasks. SLP Short Term Goal 3 (Week 2): Patient will demonstrate sustained attention to functional and familiar tasks for 5 minutes with Min A verbal cues for redirection.  SLP Short Term Goal 4 (Week 2): Patient will name functional items with Max A phonemic cues with 25% accuracy.  SLP Short Term Goal 5 (Week 2): Patient will phonate on command with Max A verbal cues in 25% of opportunities .  SLP Short Term Goal 6 (Week 2): Patient will identify functional items from a field of 2 with 75% accuracy with Mod A verbal cues.   Skilled Therapeutic Interventions: Skilled treatment session focused on dysphagia and cognitive-linguistic goals. Upon arrival, patient was awake while supine in bed and agreeable to participate in treatment session. Patient was transferred to the wheelchair [facility] the [facility] with +2 assist. Patient consumed limited amounts of her lunch meal of Dys. 2 textures with thin liquids. Patient did not demonstrate any overt s/s of aspiration and required Mod A verbal cues for use of swallow strategies. SLP also facilitated session by providing Min A phonemic and verbal cues for oral reading at the word level and Max A multimodal cues to verbalize the function of the item while using a carrier phrase. Patient errors are characterized by phonemic paraphasias and perseveration.  Overall, patient continues to demonstrate increased spontaneous verbalizations with ~5-6 words  per utterance in regards to wants/needs. Patient left upright in wheelchair at RN station. Continue with current plan of care.    FIM:  Comprehension Comprehension Mode: Auditory Comprehension: 4-Understands basic 75 - 89% of the time/requires cueing 10 - 24% of the time Expression Expression Mode: Verbal Expression: 3-Expresses basic 50 - 74% of the time/requires cueing 25 - 50% of the time. Needs to repeat parts of sentences. Social Interaction Social Interaction: 4-Interacts appropriately 75 - 89% of the time - Needs redirection for appropriate language or to initiate interaction. Problem Solving Problem Solving: 2-Solves basic 25 - 49% of the time - needs direction more than half the time to initiate, plan or complete simple activities Memory Memory: 2-Recognizes or recalls 25 - 49% of the time/requires cueing 51 - 75% of the time FIM - Eating Eating Activity: 5: Supervision/cues;5: Set-up assist for open containers  Pain Pain Assessment Pain Assessment: No/denies pain  Therapy/Group: Individual Therapy  Yarissa Reining 06/16/2014, 4:52 PM

## 2014-06-16 NOTE — Progress Notes (Signed)
[facility] PHYSICAL MEDICINE & REHABILITATION     PROGRESS NOTE    Subjective/Complaints: Denies any new issues. Feeling well. Good night sleep ROS limited due to aphasia but better  Objective: Vital Signs: Blood pressure 137/50, pulse 72, temperature 98.1 F (36.7 C), temperature source Oral, resp. rate 17, weight 83.6 kg (184 lb 4.9 oz), SpO2 97 %. No results found. No results for input(s): WBC, HGB, HCT, PLT in the last 72 hours. No results for input(s): NA, K, CL, GLUCOSE, BUN, CREATININE, CALCIUM in the last 72 hours.  Invalid input(s): CO CBG (last 3)  No results for input(s): GLUCAP in the last 72 hours.  Wt Readings from Last 3 Encounters:  06/09/14 83.6 kg (184 lb 4.9 oz)  06/03/14 90.4 kg (199 lb 4.7 oz)    Physical Exam:  HENT:  Craniotomy site clean and dry  Eyes:  Pupils reactive to light without nystagmus  Neck: Normal range of motion. Neck supple. No thyromegaly present.  Cardiovascular:  Cardiac rate controlled, regular, no murmur  Respiratory:  clear to auscultation bilaterally. No wheezes or rales GI: Soft. Bowel sounds are normal. She exhibits no distension.  Neurological:  Patient alert.  Follows basic commands. speaking in words and short phrases with reasonable accuracy. Has difficulty with higher level conversation and problem solving.  Remains trace to 0/5 RUE and RLE.  early flexor tone in RUE. Spontaneous movement of left arm and leg.. Right HH. Pinch on RUE and RLE elicits withdrawl Psychiatric: alert, appears in good spirits  Assessment/Plan: 1. 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 Physiatrist is providing close team supervision and 24 hour management of active medical problems listed below. Physiatrist and rehab team continue to assess barriers to discharge/monitor patient progress toward functional  and medical goals. FIM: FIM - Bathing Bathing Steps Patient Completed: Chest, Left Arm, Left upper leg, Right upper leg (completed peri hygiene at bed level) Bathing: 0: Activity did not occur  FIM - Upper Body Dressing/Undressing Upper body dressing/undressing steps patient completed: Thread/unthread left sleeve of pullover shirt/dress, Put head through opening of pull over shirt/dress, Thread/unthread left bra strap Upper body dressing/undressing: 2: Max-Patient completed 25-49% of tasks FIM - Lower Body Dressing/Undressing Lower body dressing/undressing: 1: Two helpers  FIM - Hotel manager Devices: Grab bar or rail for support Toileting: 1: Two helpers  FIM - Archivist Transfers: 1-Two helpers  FIM - Banker Devices: Bed rails, Arm rests Bed/Chair Transfer: 3: Supine > Sit: Mod A (lifting assist/Pt. 50-74%/lift 2 legs, 1: Two helpers  FIM - Locomotion: Wheelchair Distance: 5 ft Locomotion: Wheelchair: 1: Total Assistance/staff pushes wheelchair (Pt<25%) FIM - Locomotion: Ambulation Locomotion: Ambulation Assistive Devices: Other (comment) (3 musketeers) Ambulation/Gait Assistance: 1: +2 Total assist Locomotion: Ambulation: 1: Two helpers  Comprehension Comprehension Mode: Auditory Comprehension: 4-Understands basic 75 - 89% of the time/requires cueing 10 - 24% of the time  Expression Expression Mode: Verbal Expression: 2-Expresses basic 25 - 49% of the time/requires cueing 50 - 75% of the time. Uses single words/gestures.  Social Interaction Social Interaction: 4-Interacts appropriately 75 - 89% of the time - Needs redirection for appropriate language or to initiate interaction.  Problem Solving Problem Solving: 2-Solves basic 25 - 49% of the time - needs direction more than half the time to initiate, plan or complete simple activities  Memory Memory: 3-Recognizes or recalls 50 - 74%  of the  time/requires cueing 25 - 49% of the time  Medical Problem List and Plan: 1. 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 DVT Prophylaxis/Anticoagulation: Subcutaneous heparin initiated 05/24/2014. Monitor for any bleeding episodes 3. Pain Management: Tylenol now scheduled TID. Continue  warm/cold compresses for head prn 4. Seizure prophylaxis. Keppra 500 mg twice a day. EEG negative 5. Neuropsych: This patient is not capable of making decisions on her own behalf. 6. Skin/Wound Care: Routine skin checks 7. Fluids/Electrolytes/Nutrition: Follow i's and o's, po intake, check labs.  8. Dysphagia. Dysphagia #2 nectar liquids. Encourage PO 9. MRSA PCR screening positive. Contact precautions 10. Hypertension. Toprol-XL 25 mg daily. Continue to check per shift. No changes to regimen at present 11. Hypothyroidism. Synthroid 12. Urine: UA +, cx with 100k enterococcus and enterobacter  -changed to cipro over weekend---continue for 5 days total    LOS (Days) 13 A FACE TO FACE EVALUATION WAS PERFORMED  Careli Luzader T 06/16/2014 8:02 AM

## 2014-06-16 NOTE — Patient Care Conference (Signed)
Inpatient RehabilitationTeam Conference and Plan of Care Update Date: 06/15/2014   Time: 2:55 PM    Patient Name: Carolyn [name]      Medical Record Number: [id number]  Date of Birth: 1945-02-07 Sex: Female         Room/Bed: 4W11C/4W11C-01 Payor Info: Payor: MEDICARE / Plan: MEDICARE PART A AND B / Product Type: *No Product type* /    Admitting Diagnosis: NON TRAUMATIC ICH  Admit Date/Time:  06/03/2014  3:45 PM Admission Comments: No comment available   Primary Diagnosis:  Nontraumatic hemorrhage of cerebral hemisphere Principal Problem: Nontraumatic hemorrhage of cerebral hemisphere  Patient Active Problem List   Diagnosis Date Noted  . Embolic stroke   . HLD (hyperlipidemia)   . Aphasia   . Right hemiplegia   . Endotracheal tube present   . Stroke   . 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 Acute respiratory failure, unspecified whether with hypoxia or hypercapnia   . Acute respiratory failure   . Bradycardia 05/23/2014  . Cerebral brain hemorrhage   . Respiratory failure   . Junctional bradycardia   . Arterial hypotension   . Nontraumatic hemorrhage of cerebral hemisphere 05/20/2014  . Hypertensive emergency 05/20/2014    Expected Discharge Date: Expected Discharge Date: 07/01/14  Team Members Present: Physician leading conference: Dr. Faith Rogue Social Worker Present: Amada Jupiter, LCSW Nurse Present: Carmie End, RN PT Present: Bayard Hugger, PT OT Present: Ardis Rowan, Darolyn Rua, OT SLP Present: Feliberto Gottron, SLP PPS Coordinator present : Tora Duck, RN, CRRN     Current Status/Progress Goal Weekly Team Focus  Medical   improving language, intake better. still with dense right HP  improve language  bowels, uti rx   Bowel/Bladder   Incontinent to bowel and bladder.  Continent with time toileting.  Timed toileting Q 2 hrs and PRN.   Swallow/Nutrition/ Hydration   Dys. 2 textues with thin liquids, Mod A for use of swallow strategies   Min A   trials of Dys. 3 textures    ADL's   mod-max UB dressing, total A +2 bathing, toileting, and LB dressing; mod A +1 to L and max A +1 to R with second person for stabilizing w/c during transfers  min A overall - will continue to assess if need to downgrade  sitting balance, activity tolerance, midline orientation, postural control, transfers, visual scanning, attention to R,   Mobility   supervision-max A sitting balance, mod A x 2 sit <> stand, mod A x 1 with +2 to stabilize w/c to L and max A x 2 to R for squat pivot transfers, mod A to +2 for bed mobility,  mod-max A overall  sitting balance, midline orientation, decreasing pushing to R, transfer training, standing tolerance, pt education   Communication   Mod-Max A  Mod A  verbal expression at phrase level, verbal expression of wants/needs    Safety/Cognition/ Behavioral Observations  Mod A   Min-Mod A  attention, attention to right field of enviornment, problem solving, awareness    Pain   No complain of pain.  Less than 3 on scale 1 to 10.  Assess for pain Q 2 hrs. and PRN.   Skin   Incision line on scalp completely healed.  To keep skin free of infection.  To monitor skin Q shift.    Rehab Goals Patient on target to meet rehab goals: Yes *See Care Plan and progress notes for long and short-term goals.  Barriers to Discharge: see prior  Possible Resolutions to Barriers:  see prior, continue cognitive linguistic rx    Discharge Planning/Teaching Needs:  pt most likely to d/c to SNF      Team Discussion:  Increasing verbalizations.  Hoping to upgrade diet/ liquids soon.  Still max assist with language but better at expressing needs.  Sitting balance MUCH improved as well as transfers.  Still aiming for min/ mod goals.    Revisions to Treatment Plan:  None   Continued Need for Acute Rehabilitation Level of Care: The patient requires daily medical management by a physician with specialized training in physical medicine and  rehabilitation for the following conditions: Daily direction of a multidisciplinary physical rehabilitation program to ensure safe treatment while eliciting the highest outcome that is of practical value to the patient.: Yes Daily analysis of laboratory values and/or radiology reports with any subsequent need for medication adjustment of medical intervention for : Neurological problems;Other  Carolyn [name] 06/16/2014, 11:27 AM

## 2014-06-17 ENCOUNTER — Inpatient Hospital Stay (HOSPITAL_COMMUNITY): Payer: BLUE CROSS/BLUE SHIELD | Admitting: Speech Pathology

## 2014-06-17 ENCOUNTER — Inpatient Hospital Stay (HOSPITAL_COMMUNITY): Payer: BLUE CROSS/BLUE SHIELD

## 2014-06-17 ENCOUNTER — Inpatient Hospital Stay (HOSPITAL_COMMUNITY): Payer: BLUE CROSS/BLUE SHIELD | Admitting: Physical Therapy

## 2014-06-17 MED ORDER — SENNOSIDES-DOCUSATE SODIUM 8.6-50 MG PO TABS
2.0000 | ORAL_TABLET | Freq: Two times a day (BID) | ORAL | Status: DC
Start: 2014-06-17 — End: 2014-06-25
  Administered 2014-06-17 – 2014-06-25 (×10): 2 via ORAL
  Filled 2014-06-17 (×9): qty 2

## 2014-06-17 MED ORDER — ENSURE ENLIVE PO LIQD
237.0000 mL | Freq: Two times a day (BID) | ORAL | Status: DC
  Administered 2014-06-17 – 2014-06-18 (×3): 237 mL via ORAL

## 2014-06-17 NOTE — Progress Notes (Signed)
Initial Nutrition Assessment  DOCUMENTATION CODES:  Obesity unspecified  INTERVENTION:  Ensure Enlive (each supplement provides 350kcal and 20 grams of protein) (BID)   Encourage adequate PO intake.   NUTRITION DIAGNOSIS:  Inadequate oral intake related to  (decreased appetite) as evidenced by  (varied meal completion of 25-90%).  GOAL:  Patient will meet greater than or equal to 90% of their needs  MONITOR:  PO intake, Supplement acceptance, Weight trends, Labs, I & O's  REASON FOR ASSESSMENT:  Consult Poor PO  ASSESSMENT: Pt with history of hypertension, recent status post AAA stent grafting. Admitted 05/20/2014 after after developing severe headache right arm weakness while at work and suddenly became unresponsive. Cranial CT scan showed left ICH with 3 mm midline shift. Underwent left frontoparietal craniotomy for evacuation of intracerebral hematoma 05/20/2014.  Pt reports appetite has been decreased since admission. Meal completion has been varied from 25-90%. Pt reports PTA she ate great with at least 2 meals a day and no other difficulties. Noted pt with a 21% weight loss in 1 month. RD to monitor closely. Pt is agreeable to Ensure to aid in caloric and protein needs. RD to order. Pt was encouraged to eat her food at meals. Pt with no observed significant fat or muscle mass loss.  Labs and medications reviewed.  Height:  Ht Readings from Last 1 Encounters:  05/20/14 5\' 4"  (1.626 m)    Weight:  Wt Readings from Last 1 Encounters:  06/16/14 175 lb 4.3 oz (79.5 kg)    Ideal Body Weight:  54.5 kg  Wt Readings from Last 10 Encounters:  06/16/14 175 lb 4.3 oz (79.5 kg)  06/03/14 199 lb 4.7 oz (90.4 kg)    BMI:  Body mass index is 30.07 kg/(m^2). Class I obesity  Estimated Nutritional Needs:  Kcal:  1750-1950  Protein:  85-95 grams  Fluid:  1.7 - 1.9 L/day  Skin:   (Incision L head, +1 RUE, RLE edema)  Diet Order:  DIET DYS 2 Room service  appropriate?: No; Fluid consistency:: Thin  EDUCATION NEEDS:  No education needs identified at this time   Intake/Output Summary (Last 24 hours) at 06/17/14 1230 Last data filed at 06/17/14 0900  Gross per 24 hour  Intake    480 ml  Output      0 ml  Net    480 ml    Last BM:  5/21-sorbitol given  Marijean NiemannStephanie La, MS, RD, LDN Pager # [phone] After hours/ weekend pager # [phone]

## 2014-06-17 NOTE — Progress Notes (Signed)
Physical Therapy Weekly Progress Note  Patient Details  Name: Carolyn [name] MRN: [id number] Date of Birth: 31-Mar-1945  Beginning of progress report period: Jun 11, 2014 End of progress report period: Jun 17, 2014  Today's Date: 06/17/2014 PT Individual Time: 1100-1200 PT Individual Time Calculation (min): 60 min   Patient has met 3 of 5 short term goals.  Patient currently requires max A for bed mobility, close supervision-min A for overall sitting balance, mod-max A x 1 with +2 to stabilize wheelchair for squat pivot transfers, mod-max A for sit <> stand, and initiated ambulation this week requiring +2 A and third person for w/c follow. Patient demonstrates improved arousal and awareness resulting in improved ability to attend to therapeutic tasks with greatly improved overall sitting balance and initiation of ambulation since last progress report. Patient continues to fatigue quickly with OOB activity but demonstrates overall improvement in activity tolerance.   Patient continues to demonstrate the following deficits: RUE/RLE hemiplegia, muscle weakness, decreased sitting/standing balance, decreased balance reactions, pushing tendencies to R, decreased postural control, decreased endurance, impaired timing and sequencing, abnormal tone, unbalanced muscle activation, motor apraxia, decreased coordination, decreased motor planning, decreased sensation, field cut, decreased midline orientation, decreased attention to right, aphasia,  decreased initiation, decreased attention, decreased awareness, decreased problem solving, decreased safety awareness, decreased memory, and delayed processing and therefore will continue to benefit from skilled PT intervention to enhance overall performance with activity tolerance, balance, postural control, ability to compensate for deficits, functional use of  right upper extremity and right lower extremity, attention, awareness and coordination.  Patient progressing  toward long term goals.  Continue plan of care.  PT Short Term Goals Week 2:  PT Short Term Goal 1 (Week 2): Patient will perform bed mobility with assist of 1 person.  PT Short Term Goal 1 - Progress (Week 2): Met PT Short Term Goal 2 (Week 2): Patient will perform bed <> wheelchair transfer with mod A x 2.  PT Short Term Goal 2 - Progress (Week 2): Met PT Short Term Goal 3 (Week 2): Patient will perform dynamic sitting balance x 3 min with min A.  PT Short Term Goal 3 - Progress (Week 2): Met PT Short Term Goal 4 (Week 2): Patient will tolerate standing x 60 sec with +2 assist.  PT Short Term Goal 4 - Progress (Week 2): Progressing toward goal PT Short Term Goal 5 (Week 2): Patient will propel wheelchair x 50 ft with mod A.  PT Short Term Goal 5 - Progress (Week 2): Progressing toward goal (not focus of treatment) Week 3:  PT Short Term Goal 1 (Week 3): Patient will perform bed mobility with consistent mod A x1 with use of hospital bed functions.  PT Short Term Goal 2 (Week 3): Patient will perform bed <> wheelchair transfer with consistent max A x 1 with +2 to stabilize wheelchair.  PT Short Term Goal 3 (Week 3): Patient will tolerate standing x 60 sec with max A x 1.  PT Short Term Goal 4 (Week 3): Patient will ambulate 25 ft with assist as needed.  PT Short Term Goal 5 (Week 3): Patient will initiate stair training.   Skilled Therapeutic Interventions/Progress Updates:   Session focused on functional transfers, sitting balance, balance reactions, sit <> stand, standing tolerance, midline orientation, progression of ambulation, cognitive remediation, and overall activity tolerance. Patient performed lateral scoot transfer wheelchair <> mat table with total A x 1 and +2 to stabilize wheelchair due to patient pushing with  LUE. Performed static sitting balance with close supervision with BLE supported progressed to dynamic sitting balance with multidirectional reaching task picking between field  of 2 for colors and directions (R, L) with supervision-min A overall working controlled, graded movements and improved efficacy of balance reactions. Performed sit <> stand from mat table using hemiwalker for LUE support, therapist under LUE with mirror in front for visual feedback. Patient required mod-max A overall for transfer and standing balance with therapist providing downward approximation/stabilizing RLE to prevent knee buckling x 3 trials of approx 30-45 sec. Gait training with use of hemiwalker for LUE support and RUE around therapist's shoulder x 10 ft, total A to advance/stabilize RLE with +2 to safely advance/sequence use of HW and +3 for wheelchair follow. Patient requested to return to bed at end of session, wheelchair > bed with max A x 1 (LUE around therapist to decrease pushing) and +2 for wheelchair, sit > supine with mod A, and patient assisted with repositioning higher in bed using LUE and BLE in hooklying with RLE stabilized. Patient left semi reclined in bed with call bell in reach and bed alarm on.     Therapy Documentation Precautions:  Precautions Precautions: Fall Precaution Comments: R field cut/R inattention, pushes to R  Restrictions Weight Bearing Restrictions: No Pain: Pain Assessment Pain Assessment: No/denies pain  See FIM for current functional status  Therapy/Group: Individual Therapy  Laretta Alstrom 06/17/2014, 11:37 AM

## 2014-06-17 NOTE — Plan of Care (Signed)
Problem: RH Balance Goal: LTG Patient will maintain dynamic standing with ADLs (OT) LTG: Patient will maintain dynamic standing balance with assist during activities of daily living (OT)  Downgraded 5/26 due to slow progress in therapy  Problem: RH Dressing Goal: LTG Patient will perform lower body dressing w/assist (OT) LTG: Patient will perform lower body dressing with assist, with/without cues in positioning using equipment (OT)  Downgraded 5/26 due to slow progress in therapy  Problem: RH Toileting Goal: LTG Patient will perform toileting w/assist, cues/equip (OT) LTG: Patient will perform toiletiing (clothes management/hygiene) with assist, with/without cues using equipment (OT)  Downgraded 5/26 due to slow progress in therapy  Problem: RH Toilet Transfers Goal: LTG Patient will perform toilet transfers w/assist (OT) LTG: Patient will perform toilet transfers with assist, with/without cues using equipment (OT)  Downgraded 5/26 due to slow progress in therapy  Problem: RH Tub/Shower Transfers Goal: LTG Patient will perform tub/shower transfers w/assist (OT) LTG: Patient will perform tub/shower transfers with assist, with/without cues using equipment (OT)  Downgraded 5/26 due to slow progress in therapy

## 2014-06-17 NOTE — Progress Notes (Signed)
[facility]     PROGRESS NOTE    Subjective/Complaints: No pain. Slept well. Getting cleaned up by tech when i entered ROS limited somewhat due to aphasia   Objective: Vital Signs: Blood pressure 164/67, pulse 79, temperature 98 F (36.7 C), temperature source Oral, resp. rate 18, weight 79.5 kg (175 lb 4.3 oz), SpO2 97 %. No results found. No results for input(s): WBC, HGB, HCT, PLT in the last 72 hours. No results for input(s): NA, K, CL, GLUCOSE, BUN, CREATININE, CALCIUM in the last 72 hours.  Invalid input(s): CO CBG (last 3)  No results for input(s): GLUCAP in the last 72 hours.  Wt Readings from Last 3 Encounters:  06/16/14 79.5 kg (175 lb 4.3 oz)  06/03/14 90.4 kg (199 lb 4.7 oz)    Physical Exam:  HENT:  Craniotomy site clean and dry  Eyes:  Pupils reactive to light without nystagmus  Neck: Normal range of motion. Neck supple. No thyromegaly present.  Cardiovascular:  Cardiac rate controlled, regular, no murmur  Respiratory:  clear to auscultation bilaterally. No wheezes or rales GI: Soft. Bowel sounds are normal. She exhibits no distension.  Neurological:  Patient alert.  Follows basic commands. speaking in words and short phrases with reasonable accuracy. Has difficulty with higher level conversation and problem solving.  Remains trace to 0/5 RUE and RLE.  early flexor tone in RUE. Spontaneous movement of left arm and leg.. Right HH. Pinch on RUE and RLE elicits withdrawl Psychiatric: alert, appears in good spirits  Assessment/Plan: 1. 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 Physiatrist is providing close team supervision and 24 hour management of active medical problems listed below. Physiatrist and rehab team continue to assess barriers to discharge/monitor patient progress toward  functional and medical goals. FIM: FIM - Bathing Bathing Steps Patient Completed: Chest, Left Arm, Left upper leg, Right upper leg, Abdomen Bathing: 1: Two helpers  FIM - Upper Body Dressing/Undressing Upper body dressing/undressing steps patient completed: Thread/unthread left sleeve of pullover shirt/dress, Put head through opening of pull over shirt/dress, Thread/unthread left bra strap Upper body dressing/undressing: 2: Max-Patient completed 25-49% of tasks FIM - Lower Body Dressing/Undressing Lower body dressing/undressing: 1: Two helpers  FIM - Hotel managerToileting Toileting Assistive Devices: Grab bar or rail for support Toileting: 1: Two helpers  FIM - ArchivistToilet Transfers Toilet Transfers: 1-Two helpers  FIM - BankerBed/Chair Transfer Bed/Chair Transfer Assistive Devices: Bed rails, Arm rests Bed/Chair Transfer: 3: Supine > Sit: Mod A (lifting assist/Pt. 50-74%/lift 2 legs, 1: Two helpers, 2: Bed > Chair or W/C: Max A (lift and lower assist)  FIM - Locomotion: Wheelchair Distance: 5 ft Locomotion: Wheelchair: 1: Total Assistance/staff pushes wheelchair (Pt<25%) FIM - Locomotion: Ambulation Locomotion: Ambulation Assistive Devices: Other (comment) (3 musketeers) Ambulation/Gait Assistance: 1: +2 Total assist Locomotion: Ambulation: 1: Two helpers  Comprehension Comprehension Mode: Auditory Comprehension: 4-Understands basic 75 - 89% of the time/requires cueing 10 - 24% of the time  Expression Expression Mode: Verbal Expression: 3-Expresses basic 50 - 74% of the time/requires cueing 25 - 50% of the time. Needs to repeat parts of sentences.  Social Interaction Social Interaction: 4-Interacts appropriately 75 - 89% of the time - Needs redirection for appropriate language or to initiate interaction.  Problem Solving Problem Solving: 2-Solves basic 25 - 49% of the time - needs direction more than half the time to initiate, plan or complete  simple activities  Memory Memory: 2-Recognizes or  recalls 25 - 49% of the time/requires cueing 51 - 75% of the time  Medical Problem List and Plan: 1. 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 DVT Prophylaxis/Anticoagulation: Subcutaneous heparin initiated 05/24/2014. Monitor for any bleeding episodes 3. Pain Management: Tylenol now scheduled TID. Continue  warm/cold compresses for head prn 4. Seizure prophylaxis. Keppra 500 mg twice a day. EEG negative 5. Neuropsych: This patient is not capable of making decisions on her own behalf. 6. Skin/Wound Care: Routine skin checks 7. Fluids/Electrolytes/Nutrition: Follow i's and o's, po intake, check labs.  8. Dysphagia. Dysphagia #2 nectar liquids. Encourage PO 9. MRSA PCR screening positive. Contact precautions 10. Hypertension. Toprol-XL 25 mg daily. Continue to check per shift. No changes to regimen at present 11. Hypothyroidism. Synthroid 12. Urine: 100k enterococcus and enterobacter  -cipro completed    LOS (Days) 14 A FACE TO FACE EVALUATION WAS PERFORMED  SWARTZ,ZACHARY T 06/17/2014 8:10 AM

## 2014-06-17 NOTE — Progress Notes (Signed)
Occupational Therapy Weekly Progress Note  Patient Details  Name: Carolyn [name] MRN: [id number] Date of Birth: 1945/04/07  Beginning of progress report period: Jun 11, 2014 End of progress report period: Jun 17, 2014  Today's Date: 06/17/2014 OT Individual Time:0800-0900 and 1400-1455 OT Individual Treatment (min): 60 min and 55 min    Patient has met 3 of 5 short term goals. Patient has made some progress during this reporting period. Patient currently requires max-total A +2 for toilet transfers and max A +1 sit<>stand with second person present for hygiene/clothing management. Pt demonstrates improved midline orientation as she requires min-SBA sitting balance EOB/EOM with verbal cues for postural control. Patient has slight tone in RUE however no AROM noted. Patient demonstrates improved awareness and ability to communicate personal needs during therapy sessions. Patient's greatest barriers at this time are R hemiplegia, apraxia, and decreased postural control.  Patient continues to demonstrate the following deficits: R hemiplegia, decreased midline orientation, decreased postural control, decreased sitting and standing balance, decreased core strength, R inattention, decreased awareness, cognitive-linguistic deficits, decreased safety awareness, decreased activity tolerance, decreased strength, decreased coordination, pushing tendencies to R, poor spatial awareness, decreased sensation, oral and motor apraxia, decreased initiation and therefore will continue to benefit from skilled OT intervention to enhance overall performance with BADL, Reduce care partner burden and improve midline orientation, postural control, balance, attention to R, and strength  Patient not progressing toward long term goals.  See goal revision..  Plan of care revisions: Downgraded LB dressing, toilet transfer, toilet task, dynamic standing balance, and shower transfer goals to mod A due to slow progress in  therapy.  OT Short Term Goals Week 2:  OT Short Term Goal 1 (Week 2): Pt will demonstrate static sitting EOB/EOM for 2 min with min A OT Short Term Goal 1 - Progress (Week 2): Met OT Short Term Goal 2 (Week 2): Pt will demonstrate increased awareness of RUE to wash/dress with mod multimodal cues OT Short Term Goal 2 - Progress (Week 2): Met OT Short Term Goal 3 (Week 2): Pt will consistently complete functional transfer with max A  OT Short Term Goal 3 - Progress (Week 2): Partly met OT Short Term Goal 4 (Week 2): Pt will complete LB dressing with max A OT Short Term Goal 4 - Progress (Week 2): Not met OT Short Term Goal 5 (Week 2): Pt stand for 45 seconds with or without mechanical lift (e.g [name]) to increase activity tolerance and midline orientation OT Short Term Goal 5 - Progress (Week 2): Met Week 3:  OT Short Term Goal 1 (Week 3): Pt will complete LB dressing with max A+1 OT Short Term Goal 2 (Week 3): Pt will consistently complete toilet transfer with max A and second person present for stabilizing w/c OT Short Term Goal 3 (Week 3): Pt will verbalize type of clothing during dressing task with 75% accuracy OT Short Term Goal 4 (Week 3): Pt will scan to right to locate 50% of self-care items with mod cues OT Short Term Goal 5 (Week 3): Pt will complete functional activity in standing for 75 seconds with max A for standing balance  Skilled Therapeutic Interventions/Progress Updates:    Session 1: Pt seen for ADL retraining with focus on bed mobility, postural control, standing tolerance, activity tolerance, attention to R, midline orientation. Pt received supine in bed eager to shower. Completed supine>sit with mod A with pt requiring min-mod A sitting balance EOB. Utilized [name] Lift to transfer to/from Geophysicist/field seismologist.  Pt completed bathing task at shower level initiating washing 60% of body, requiring assist from therapist for thoroughness. Pt required min tactile and verbal cues for  washing RUE while visually attending to Jay. Donned gown due to no clean clothing at this time with pt requiring max cues for hemi dressing technique. Pt completed oral care with min A due to apraxia and cues to apply toothpaste. Pt demonstrated improved arousal throughout therapy session. Pt left sitting at nurses station reclined in w/c.   Session 2: Pt seen for 1:1 OT session with focus on functional transfers, sitting balance, activity tolerance, safety awareness, and standing balance. Pt received supine in bed. Pt completed supine>sit with max A and max cues for sequencing. Upon sitting EOB, pt having incontinent episode. Began transfer bed>w/c with loose BM leaking onto bed. Pt becoming impulsive and attempting to transfer unsafely to w/c requiring total A +2 for safety. Pt completed squat pivot transfer w/c<>toilet with max A and second person present to stabilize w/c. Pt continued BM requiring min A for sitting balance on toilet. Pt completed sit<>stand with max A and second therapist present for hygiene. Donned brief with pt threading RLE with increased time. Completed sit<>stand with max A and second therapist to manage clothing up. Practiced functional transfers w/c<>mat table with max A and second therapist present for stabilizing w/c. Pt sat EOM apprx 10 min with min-SBA for balance while therapist provided PROM to RUE. Pt continues to demonstrate minimal tone in RUE. Pt returned to w/c secondary to low back pain and left sitting in w/c at nurses station.   Therapy Documentation Precautions:  Precautions Precautions: Fall Precaution Comments: R field cut/R inattention, pushes to R  Restrictions Weight Bearing Restrictions: No General:   Vital Signs: Therapy Vitals Temp: 98 F (36.7 C) Temp Source: Oral Pulse Rate: 79 Resp: 18 BP: (!) 164/67 mmHg Patient Position (if appropriate): Lying Oxygen Therapy SpO2: 97 % O2 Device: Not Delivered Pain:  No report of pain  See FIM for  current functional status  Therapy/Group: Individual Therapy  Duayne Cal 06/17/2014, 6:51 AM

## 2014-06-17 NOTE — Progress Notes (Signed)
Speech Language Pathology Weekly Progress and Session Note  Patient Details  Name: Robbyn Hodkinson MRN: [id number] Date of Birth: 09/19/1945  Beginning of progress report period: Jun 10, 2014 End of progress report period: Jun 17, 2014  Today's Date: 06/17/2014 SLP Individual Time: 0930-1000 SLP Individual Time Calculation (min): 30 min  Short Term Goals: Week 2: SLP Short Term Goal 1 (Week 2): Patient will consume current diet without overt s/s of aspiration with Min A verbal cues for use of swallow strategies.  SLP Short Term Goal 1 - Progress (Week 2): Not met SLP Short Term Goal 2 (Week 2): Patient will scan to midline with Mod A verbal cues during functional and familiar tasks. SLP Short Term Goal 2 - Progress (Week 2): Met SLP Short Term Goal 3 (Week 2): Patient will demonstrate sustained attention to functional and familiar tasks for 5 minutes with Min A verbal cues for redirection.  SLP Short Term Goal 3 - Progress (Week 2): Met SLP Short Term Goal 4 (Week 2): Patient will name functional items with Max A phonemic cues with 25% accuracy.  SLP Short Term Goal 4 - Progress (Week 2): Met SLP Short Term Goal 5 (Week 2): Patient will phonate on command with Max A verbal cues in 25% of opportunities .  SLP Short Term Goal 5 - Progress (Week 2): Met SLP Short Term Goal 6 (Week 2): Patient will identify functional items from a field of 2 with 75% accuracy with Mod A verbal cues.  SLP Short Term Goal 6 - Progress (Week 2): Met    New Short Term Goals: Week 3: SLP Short Term Goal 1 (Week 3): Patient will consume current diet without overt s/s of aspiration with Min A verbal cues for use of swallow strategies.  SLP Short Term Goal 2 (Week 3): Patient will scan to the right field of enviornment with Min A verbal cues during functional and familiar tasks. SLP Short Term Goal 3 (Week 3): Patient will demonstrate selective attention to functional and familiar tasks for 30 minutes with Min A  verbal cues for redirection.  SLP Short Term Goal 4 (Week 3): Patient will name functional items with Mod A phonemic cues with 75% accuracy.  SLP Short Term Goal 5 (Week 3): Patient will demonstrate reading comprehension at the phrase level with Mod A multimodal cues with 75% accruacy.  SLP Short Term Goal 6 (Week 3): Patient will verbalize wants/needs at the phrase level with Mod A multimodal cues.   Weekly Progress Updates: Patient has made functional gains and has met 5 of 6 STG's this reporting period due to increased sustained attention, attention to right field of environment, verbal expression and auditory comprehension. Currently, patient is consuming Dys. 2 textures with thin liquids without overt s/s of aspiration and requires Mod A verbal cues for use of swallowing compensatory strategies. Patient also demonstrates efficient mastication with trials of Dys. 3 textures but recommend trial tray prior to upgrade. Patient demonstrates increased functional communication and is voicing consistently and is making her needs known at the word and phrase level with Mod-Max A multimodal cues. Patient is also naming functional items with 80% accuracy and demonstrating reading comprehension at the word level with 70% accuracy with Max A multimodal cues. Patient also demonstrates arousal and cognitive function and requires Mod A multimodal cues for sustained attention and attention to right field of environment with functional and familiar tasks.  Patient/family education ongoing. Patient would benefit from continued skilled SLP intervention to maximize  her cognitive function and functional communication prior to discharge.    Intensity: Minumum of 1-2 x/day, 30 to 90 minutes Frequency: 1 to 3 out of 7 days Duration/Length of Stay: 07/01/14 Treatment/Interventions: Cognitive remediation/compensation;Cueing hierarchy;Environmental controls;Dysphagia/aspiration precaution training;Functional  tasks;Internal/external aids;Patient/family education;Speech/Language facilitation;Therapeutic Activities   Daily Session  Skilled Therapeutic Interventions: Skilled treatment session focused on dysphagia goals. Patient consumed trials of Dys. 3 textures with thin liquids without overt s/s of aspiration and demonstrated efficient mastication with minimal right buccal pocketing that she cleared with supervision verbal cues.Recommend trail tray prior to upgrade. Patient left at RN station with quick release belt in place. Continue with current plan of care.       FIM:  Comprehension Comprehension Mode: Auditory Comprehension: 5-Understands basic 90% of the time/requires cueing < 10% of the time Expression Expression Mode: Verbal Expression: 3-Expresses basic 50 - 74% of the time/requires cueing 25 - 50% of the time. Needs to repeat parts of sentences. Social Interaction Social Interaction: 5-Interacts appropriately 90% of the time - Needs monitoring or encouragement for participation or interaction. Problem Solving Problem Solving: 2-Solves basic 25 - 49% of the time - needs direction more than half the time to initiate, plan or complete simple activities Memory Memory: 2-Recognizes or recalls 25 - 49% of the time/requires cueing 51 - 75% of the time FIM - Eating Eating Activity: 5: Supervision/cues;5: Set-up assist for open containers Pain No/Denies Pain   Therapy/Group: Individual Therapy  Sherronda Sweigert 06/17/2014, 4:38 PM

## 2014-06-18 ENCOUNTER — Inpatient Hospital Stay (HOSPITAL_COMMUNITY): Payer: BLUE CROSS/BLUE SHIELD | Admitting: Speech Pathology

## 2014-06-18 ENCOUNTER — Inpatient Hospital Stay (HOSPITAL_COMMUNITY): Payer: BLUE CROSS/BLUE SHIELD | Admitting: *Deleted

## 2014-06-18 ENCOUNTER — Inpatient Hospital Stay (HOSPITAL_COMMUNITY): Payer: BLUE CROSS/BLUE SHIELD | Admitting: Physical Therapy

## 2014-06-18 NOTE — Progress Notes (Signed)
Physical Therapy Session Note  Patient Details  Name: Carolyn [name] MRN: [id number] Date of Birth: 09-03-1945  Today's Date: 06/18/2014 PT Individual Time: 1300-1415 PT Individual Time Calculation (min): 75 min   Short Term Goals: Week 3:  PT Short Term Goal 1 (Week 3): Patient will perform bed mobility with consistent mod A x1 with use of hospital bed functions.  PT Short Term Goal 2 (Week 3): Patient will perform bed <> wheelchair transfer with consistent max A x 1 with +2 to stabilize wheelchair.  PT Short Term Goal 3 (Week 3): Patient will tolerate standing x 60 sec with max A x 1.  PT Short Term Goal 4 (Week 3): Patient will ambulate 25 ft with assist as needed.  PT Short Term Goal 5 (Week 3): Patient will initiate stair training.   Skilled Therapeutic Interventions/Progress Updates:   Session focused on ambulation, initiation of stair training, functional transfers, midline orientation, attending to R, and overall cognitive remediation. Patient propelled wheelchair using L hemi technique x 150 ft with S-min A, total multimodal cues for attending to R, obstacle negotiation on R, and sequencing. Gait using L rail in hallway 2 x 30 ft with mod A on first trial and max A on second trial, therapist advancing/stabilizing RLE with patient demonstrating improved upright posture and midline orientation during first trial of ambulation but fatigued quickly on second trial, requiring increased assist and demonstrating increased pushing to R. Patient requires verbal cues for sequencing when to step with LLE for safety to ensure RLE on ground before stepping. Patient negotiated up/down two 5" steps using L rail with total A x 2, max verbal/tactile cues and initial demonstration for sequencing. Patient ascended forwards leading with LLE and descended backwards leading with LLE. Practiced squat pivot transfer training wheelchair <> level mat table to R and L x 4 with focus on hand placement and head hips  relationship. Patient required mod-max A overall via Bobath technique and +2 to stabilize wheelchair. On raised mat, patient performed dynamic sitting balance with BLE unsupported, hitting beach ball back and forth to rehab tech using LUE with close supervision and only 1 LOB requiring min A to regain balance. Performed sit <> stand from mat with mod A, therapist stabilizing with downward approximation through RLE with reaching task to facilitate weight shifting to L in standing. Patient performed squat pivot transfer back to wheelchair and left sitting at RN station with quick release belt on.   Therapy Documentation Precautions:  Precautions Precautions: Fall Precaution Comments: R field cut/R inattention, pushes to R  Restrictions Weight Bearing Restrictions: No Pain: Pain Assessment Pain Assessment: No/denies pain Locomotion : Ambulation Ambulation/Gait Assistance: 1: +2 Total assist   See FIM for current functional status  Therapy/Group: Individual Therapy  Kerney ElbeVarner, [name] 06/18/2014, 2:09 PM

## 2014-06-18 NOTE — Progress Notes (Signed)
Occupational Therapy Session Note  Patient Details  Name: Carolyn [name] MRN: [id number] Date of Birth: [birth year]  Today's Date: 06/18/2014 OT Individual Time: 0900-1000 OT Individual Time Calculation (min): 60 min    Short Term Goals: Week 3:  OT Short Term Goal 1 (Week 3): Pt will complete LB dressing with max A+1 OT Short Term Goal 2 (Week 3): Pt will consistently complete toilet transfer with max A and second person present for stabilizing w/c OT Short Term Goal 3 (Week 3): Pt will verbalize type of clothing during dressing task with 75% accuracy OT Short Term Goal 4 (Week 3): Pt will scan to right to locate 50% of self-care items with mod cues OT Short Term Goal 5 (Week 3): Pt will complete functional activity in standing for 75 seconds with max A for standing balance  Skilled Therapeutic Interventions/Progress Updates:    Pt resting in bed upon arrival and "ready to get up."  Pt incontinent of bowel and unaware.  Pt required max A for hygiene and donning brief at bedside.  Pt requires min A for rolling to right and max A for rolling to left with max verbal cues to attend to right side (UE and LE). Pt required max A for supine->sit and mod A for sitting balance in preparation for SPT to w/c.  Pt engaged in BADL retraining including bathing and dressing with sit<>stand from w/c at sink.  Pt requires max multimodal cues for incorporating hemi bathing and dressing techniques throughout session. Pt requires max multimodal cues to attend to right and locate items placed in right visual field.  Pt does not incorporate compensatory strategies.  Focus on bed mobility, sit<>stand, functional transfers, standing balance, BADLs, hemi bathing and dressing techniques, attention to right, and safety awareness.  Therapy Documentation Precautions:  Precautions Precautions: Fall Precaution Comments: R field cut/R inattention, pushes to R  Restrictions Weight Bearing Restrictions: No Pain: Pain  Assessment Pain Assessment: No/denies pain ADL: ADL ADL Comments: Refer to FIM  See FIM for current functional status  Therapy/Group: Individual Therapy  Rich BraveLanier, Shelley Pooley Chappell 06/18/2014, 10:01 AM

## 2014-06-18 NOTE — Progress Notes (Signed)
Social Work Patient ID: Carolyn [name], female   DOB: 1945/10/24, 69 y.o.   MRN: [id number]   Able to connect with daughter, Helmut Musterlicia, this morning via phone to review team conference.  She and family are pleased with gains being made and are very interested in having a family conference next week if possible.  I will coordinate with tx team and follow up with family to schedule.  Discuss d/c plans and daughter reports family is "leaning towards SNF" to maximize time that pt is receiving daily therapies prior to d/c home.  Will continue to follow for support and d/c planning needs.  Mariavictoria Nottingham, LCSW

## 2014-06-18 NOTE — Progress Notes (Signed)
Speech Language Pathology Daily Session Note  Patient Details  Name: Carolyn CourtGeraldine Marczak MRN: [id number] Date of Birth: 1945-08-13  Today's Date: 06/18/2014 SLP Individual Time: 1100-1200 SLP Individual Time Calculation (min): 60 min  Short Term Goals: Week 3: SLP Short Term Goal 1 (Week 3): Patient will consume current diet without overt s/s of aspiration with Min A verbal cues for use of swallow strategies.  SLP Short Term Goal 2 (Week 3): Patient will scan to the right field of enviornment with Min A verbal cues during functional and familiar tasks. SLP Short Term Goal 3 (Week 3): Patient will demonstrate selective attention to functional and familiar tasks for 30 minutes with Min A verbal cues for redirection.  SLP Short Term Goal 4 (Week 3): Patient will name functional items with Mod A phonemic cues with 75% accuracy.  SLP Short Term Goal 5 (Week 3): Patient will demonstrate reading comprehension at the phrase level with Mod A multimodal cues with 75% accruacy.  SLP Short Term Goal 6 (Week 3): Patient will verbalize wants/needs at the phrase level with Mod A multimodal cues.   Skilled Therapeutic Interventions: Skilled treatment session focused on speech/language and dysphagia goals. SLP facilitated session by providing Max A verbal and visual cues for oral reading at the phrase level and Min-Mod A phonemic cues for word-finding during a sentence completion task.  Patient also consumed lunch meal of Dys. 3 textures with thin liquids without overt s/s of aspiration and utilized swallowing compensatory strategies with supervision verbal cues. Recommend patient continue current diet.  Patient left upright in wheelchair with quick release belt in place at RN station. Continue with current plan of care.    FIM:  Comprehension Comprehension Mode: Auditory Comprehension: 5-Understands basic 90% of the time/requires cueing < 10% of the time Expression Expression Mode: Verbal Expression:  3-Expresses basic 50 - 74% of the time/requires cueing 25 - 50% of the time. Needs to repeat parts of sentences. Social Interaction Social Interaction: 5-Interacts appropriately 90% of the time - Needs monitoring or encouragement for participation or interaction. Problem Solving Problem Solving: 2-Solves basic 25 - 49% of the time - needs direction more than half the time to initiate, plan or complete simple activities Memory Memory: 2-Recognizes or recalls 25 - 49% of the time/requires cueing 51 - 75% of the time FIM - Eating Eating Activity: 5: Supervision/cues;5: Set-up assist for open containers  Pain Pain Assessment Pain Assessment: No/denies pain  Therapy/Group: Individual Therapy  Raelee Rossmann 06/18/2014, 12:03 PM

## 2014-06-19 ENCOUNTER — Inpatient Hospital Stay (HOSPITAL_COMMUNITY): Payer: BLUE CROSS/BLUE SHIELD | Admitting: *Deleted

## 2014-06-19 LAB — CLOSTRIDIUM DIFFICILE BY PCR: Toxigenic C. Difficile by PCR: NEGATIVE

## 2014-06-19 NOTE — Progress Notes (Signed)
Patient ID: Carolyn [name], female   DOB: 19-Sep-1945, 69 y.o.   MRN: [id number]   Patient ID: Carolyn [name], female   DOB: 19-Sep-1945, 69 y.o.   MRN: [id number]  Patient ID: Carolyn [name], female   DOB: 19-Sep-1945, 69 y.o.   MRN: [id number]    [facility] RE[facility]ILIT[facility]     PROGRESS NOTE  06/19/14.  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  Hospital course complicated by hypertensive urgency and acute respiratory failure.  Alert, comfortable night without concerns or complaints.    Past Medical History  Diagnosis Date  . Hypertension   . AAA (abdominal aortic aneurysm)     a. s/p stent grafting @ [facility].  . Hemorrhagic cerebrovascular accident (CVA)     a. 05/20/2014 L hemispheric hemorrhagic CVA s/p decompressive craniotomy and evacuation.  . H/O echocardiogram     a. 04/2014 Echo: EF 65-70%, Gr 1 DD, mild LVH.     Objective: Vital Signs: Blood pressure 148/61, pulse 82, temperature 97.9 F (36.6 C), temperature source Oral, resp. rate 18, weight 175 lb 4.3 oz (79.5 kg), SpO2 96 %. No results found. No results for input(s): WBC, HGB, HCT, PLT in the last 72 hours. No results for input(s): NA, K, CL, GLUCOSE, BUN, CREATININE, CALCIUM in the last 72 hours.  Invalid input(s): CO   Patient Vitals for the past 24 hrs:  BP Temp Temp src Pulse Resp SpO2  06/19/14 0553 (!) 148/61 mmHg 97.9 F (36.6 C) Oral 82 18 96 %  06/18/14 2140 (!) 114/55 mmHg - - 80 - -  06/18/14 1611 140/71 mmHg 98 F (36.7 C) Oral 81 17 97 %     Intake/Output Summary (Last 24 hours) at 06/19/14 0928 Last data filed at 06/19/14 0916  Gross per 24 hour  Intake   1080 ml  Output      0 ml  Net   1080 ml       Wt Readings from Last 3 Encounters:  06/16/14 175 lb 4.3 oz (79.5 kg)  06/03/14 199 lb 4.7 oz (90.4 kg)    Physical Exam:  HENT:   neg Craniotomy site clean and dry  Eyes:  Pupils reactive to light without nystagmus  Neck: Normal range of motion. Neck supple. No thyromegaly present.  Cardiovascular:  Cardiac rate controlled, regular, no murmur  Respiratory:  clear to auscultation bilaterally. No wheezes or rales GI: Soft. Bowel sounds are normal. She exhibits no distension.  Neurological:  Patient alert.  Follows basic commands.  Dense right hemiparesis with Right HH.  Extremities-   Psychiatric: alert, appears in good spirits  Assessment/Plan: 1. Functional deficits secondary to left fronto-parietal ICH s/p craniotomy  2. DVT Prophylaxis/Anticoagulation: Subcutaneous heparin initiated 05/24/2014. Monitor for any bleeding episodes  3. Seizure prophylaxis. Keppra 500 mg twice a day. EEG negative  4. Hypertension. Toprol-XL 25 mg daily. Continue to check per shift. No changes to regimen at present    LOS (Days) 16 A FACE TO FACE EVALUATION WAS PERFORMED  Rogelia BogaKWIATKOWSKI,[place]on Near FRANK 06/19/2014 9:28 AM

## 2014-06-19 NOTE — Progress Notes (Signed)
Physical Therapy Session Note  Patient Details  Name: Carolyn CourtGeraldine Brau MRN: [id number] Date of Birth: [birth year]  Today's Date: 06/19/2014 PT Individual Time: [id number] PT Individual Time Calculation (min): 45 min     Skilled Therapeutic Interventions/Progress Updates:   Patient in bed , initiated to perform transfer to sit EOB , patient requested to use bathroom, when bed pan ready patient has already soiled her clothing in bed. CNA has been called in to assist in hygiene and changing clothes. Patient with very large BM with strong odor, causing increased discomfort and patient moaning in pain when being cleaned,RN and MD arrived during treatment to assess, no apparent cause found,speciment collected to test for C.Diff.During hygiene activities patient preformed turning R and L with max A and needed mod to max A to stay on side.  Transfer to sit EOB with max A, sitting EOB with R side lean and difficulty coming to mid line,pt is fatigued after previous situation. Transfer to w/c with max A via squat pivot. Patient positioned in w/c, with QR belt on and left at the nurses station.   Therapy Documentation Precautions:  Precautions Precautions: Fall Precaution Comments: R field cut/R inattention, pushes to R  Restrictions Weight Bearing Restrictions: No Pain: Pain Assessment Pain Assessment: No/denies pain  See FIM for current functional status  Therapy/Group: Individual Therapy  Dorna MaiCzajkowska, Asya Derryberry W 06/19/2014, 12:41 PM

## 2014-06-20 ENCOUNTER — Inpatient Hospital Stay (HOSPITAL_COMMUNITY): Payer: BLUE CROSS/BLUE SHIELD

## 2014-06-20 NOTE — Progress Notes (Signed)
Patient ID: Carolyn CourtGeraldine [name], female   DOB: Aug 13, 1945, 69 y.o.   MRN: [id number]   DOB: Aug 13, 1945, 69 y.o.   MRN: [id number]    [place] PHYSICAL MEDICINE & REHABILITATION     PROGRESS NOTE  06/20/14.  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  Hospital course complicated by hypertensive urgency and acute respiratory failure.  Alert, comfortable night without concerns or complaints.    Past Medical History  Diagnosis Date  . Hypertension   . AAA (abdominal aortic aneurysm)     a. s/p stent grafting @ [facility].  . Hemorrhagic cerebrovascular accident (CVA)     a. 05/20/2014 L hemispheric hemorrhagic CVA s/p decompressive craniotomy and evacuation.  . H/O echocardiogram     a. 04/2014 Echo: EF 65-70%, Gr 1 DD, mild LVH.     Objective: Vital Signs: Blood pressure 134/64, pulse 77, temperature 98 F (36.7 C), temperature source Oral, resp. rate 18, weight 175 lb 4.3 oz (79.5 kg), SpO2 96 %. No results found. No results for input(s): WBC, HGB, HCT, PLT in the last 72 hours. No results for input(s): NA, K, CL, GLUCOSE, BUN, CREATININE, CALCIUM in the last 72 hours.  Invalid input(s): CO   Patient Vitals for the past 24 hrs:  BP Temp Temp src Pulse Resp SpO2  06/20/14 0603 134/64 mmHg 98 F (36.7 C) Oral 77 18 96 %  06/19/14 2126 (!) 144/68 mmHg - - 80 - -  06/19/14 1356 (!) 143/60 mmHg 97.7 F (36.5 C) Oral 78 18 98 %     Intake/Output Summary (Last 24 hours) at 06/20/14 0814 Last data filed at 06/19/14 1808  Gross per 24 hour  Intake    840 ml  Output      0 ml  Net    840 ml       Wt Readings from Last 3 Encounters:  06/16/14 175 lb 4.3 oz (79.5 kg)  06/03/14 199 lb 4.7 oz (90.4 kg)    Physical Exam:  HENT:  neg Craniotomy site clean and dry  Eyes:  Pupils reactive to light without nystagmus  Neck: Normal range of motion. Neck supple.  No thyromegaly present.  Cardiovascular:  Cardiac rate controlled, regular, no murmur  Respiratory:  clear to auscultation bilaterally. No wheezes or rales GI: Soft. Bowel sounds are normal. She exhibits no distension.  Neurological:  Patient alert.  Follows basic commands.  Dense right hemiparesis with Right HH.  Extremities-   Psychiatric: alert, appears in good spirits  Assessment/Plan: 1. Functional deficits secondary to left fronto-parietal ICH s/p craniotomy  2. DVT Prophylaxis/Anticoagulation: Subcutaneous heparin initiated 05/24/2014. Monitor for any bleeding episodes  3. Seizure prophylaxis. Keppra 500 mg twice a day. EEG negative  4. Hypertension. Toprol-XL 25 mg daily. Continue to check per shift. No changes to regimen at present    LOS (Days) 17 A FACE TO FACE EVALUATION WAS PERFORMED  Rogelia BogaKWIATKOWSKI,Janyia Guion FRANK 06/20/2014 8:14 AM

## 2014-06-20 NOTE — Progress Notes (Signed)
Occupational Therapy Session Note  Patient Details  Name: Carolyn CourtGeraldine Mazariego MRN: [id number] Date of Birth: 1945-05-05  Today's Date: 06/20/2014 OT Individual Time: [id number] OT Individual Time Calculation (min): 45 min    Short Term Goals: Week 3:  OT Short Term Goal 1 (Week 3): Pt will complete LB dressing with max A+1 OT Short Term Goal 2 (Week 3): Pt will consistently complete toilet transfer with max A and second person present for stabilizing w/c OT Short Term Goal 3 (Week 3): Pt will verbalize type of clothing during dressing task with 75% accuracy OT Short Term Goal 4 (Week 3): Pt will scan to right to locate 50% of self-care items with mod cues OT Short Term Goal 5 (Week 3): Pt will complete functional activity in standing for 75 seconds with max A for standing balance  Skilled Therapeutic Interventions/Progress Updates:    Pt resting in bed upon arrival and requested use of bed pan prior to getting OOB.  Pt unsuccessful in use of bed pan and clean brief donned prior to sitting EOB.  Pt required max A for supine->sit.  Once seated, patient stated [facility]  Pt required tot A + 2 for SPT to drop arm BSC.  Pt required min A for sitting balance on BSC and tot A + 2 for toileting tasks and transfer to w/c.  Pt engaged in dressing tasks with max multimodal cues to attend to right and for hemi dressing techniques.  Pt remained in w/c at nurses station at end of session.  Focus on activity tolerance, bed mobility, sitting balance, sit<>stand, functional transfers, attention to right, task initiation, sequencing, and safety awareness.  Therapy Documentation Precautions:  Precautions Precautions: Fall Precaution Comments: R field cut/R inattention, pushes to R  Restrictions Weight Bearing Restrictions: No Pain:  Pt denied pain ADL: ADL ADL Comments: Refer to FIM  See FIM for current functional status  Therapy/Group: Individual Therapy  Rich BraveLanier, Fallan Mccarey  Chappell 06/20/2014, 9:45 AM

## 2014-06-21 ENCOUNTER — Inpatient Hospital Stay (HOSPITAL_COMMUNITY): Payer: BLUE CROSS/BLUE SHIELD

## 2014-06-21 ENCOUNTER — Inpatient Hospital Stay (HOSPITAL_COMMUNITY): Payer: BLUE CROSS/BLUE SHIELD | Admitting: Speech Pathology

## 2014-06-21 ENCOUNTER — Inpatient Hospital Stay (HOSPITAL_COMMUNITY): Payer: BLUE CROSS/BLUE SHIELD | Admitting: Physical Therapy

## 2014-06-21 MED ORDER — METOPROLOL TARTRATE 12.5 MG HALF TABLET
12.5000 mg | ORAL_TABLET | Freq: Two times a day (BID) | ORAL | Status: DC
  Administered 2014-06-21 – 2014-07-14 (×46): 12.5 mg via ORAL
  Filled 2014-06-21 (×48): qty 1

## 2014-06-21 MED ORDER — PANTOPRAZOLE SODIUM 40 MG PO TBEC
40.0000 mg | DELAYED_RELEASE_TABLET | Freq: Every day | ORAL | Status: DC
  Administered 2014-06-22 – 2014-07-13 (×18): 40 mg via ORAL
  Filled 2014-06-21 (×17): qty 1

## 2014-06-21 NOTE — Progress Notes (Signed)
69 y.o. right handed female with history of hypertension, recent status post AAA stent grafting at [facility] and was maintained on aspirin and Plavix.. 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 Admitted 05/20/2014 after after developing severe headache right arm weakness while at work and suddenly became unresponsive. Blood pressure 241/123. Cranial CT scan showed left ICH with 3 mm midline shift Subjective/Complaints: No complaints but has cognitive deficits. Does not remember me from Friday. Incontinent bowel movement with OT this morning had some indication but significant urgency  ROS: Cannot obtain secondary to mental status, aphasia  Objective: Vital Signs: Blood pressure 147/53, pulse 79, temperature 98 F (36.7 C), temperature source Oral, resp. rate 18, weight 79.5 kg (175 lb 4.3 oz), SpO2 100 %. No results found. Results for orders placed or performed during the hospital encounter of 06/03/14 (from the past 72 hour(s))  Clostridium Difficile by PCR     Status: None   Collection Time: 06/19/14 10:32 AM  Result Value Ref Range   C difficile by pcr NEGATIVE NEGATIVE     HEENT: left scalp wound with scab no evidence of drainage Cardio: RRR and no murmurs Resp: CTA B/L and unlabored GI: BS positive and nontender, nondistended Extremity:  Pulses positive and No Edema Skin:   Wound C/D/I and left scalp Neuro: Confused, Abnormal Sensory reduced sensation right upper extremity, Abnormal Motor 0/5 right upper extremity, 0/5 left lower extremity and Aphasic Musc/Skel:  Other no pain with right shoulder range of motion Gen. no acute distress   Assessment/Plan: 1. Functional deficits secondary to left intracranial hemorrhage status post evacuation 05/20/2014 with right hemiplegia and aphasia which require 3+ hours per day of interdisciplinary therapy in a comprehensive inpatient rehab setting. Physiatrist is providing  close team supervision and 24 hour management of active medical problems listed below. Physiatrist and rehab team continue to assess barriers to discharge/monitor patient progress toward functional and medical goals. FIM: FIM - Bathing Bathing Steps Patient Completed: Chest, Abdomen, Left Arm Bathing: 2: Max-Patient completes 3-4 50f 10 parts or 25-49%  FIM - Upper Body Dressing/Undressing Upper body dressing/undressing steps patient completed: Thread/unthread left bra strap, Thread/unthread left sleeve of pullover shirt/dress, Put head through opening of pull over shirt/dress Upper body dressing/undressing: 2: Max-Patient completed 25-49% of tasks FIM - Lower Body Dressing/Undressing Lower body dressing/undressing: 1: Two helpers  FIM - Hotel manager Devices: Grab bar or rail for support Toileting: 1: Two helpers  FIM - Diplomatic Services operational officer Devices: Psychiatrist Transfers: 1-Two helpers  FIM - Banker Devices: Arm rests Bed/Chair Transfer: 1: Two helpers  FIM - Locomotion: Wheelchair Distance: 5 ft Locomotion: Wheelchair: 4: Travels 150 ft or more: maneuvers on rugs and over door sillls with minimal assistance (Pt.>75%) FIM - Locomotion: Ambulation Locomotion: Ambulation Assistive Devices: Other (comment) (L rail in hallway) Ambulation/Gait Assistance: 1: +2 Total assist Locomotion: Ambulation: 1: Two helpers  Comprehension Comprehension Mode: Auditory Comprehension: 5-Understands basic 90% of the time/requires cueing < 10% of the time  Expression Expression Mode: Verbal Expression: 3-Expresses basic 50 - 74% of the time/requires cueing 25 - 50% of the time. Needs to repeat parts of sentences.  Social Interaction Social Interaction: 5-Interacts appropriately 90% of the time - Needs monitoring or encouragement for participation or interaction.  Problem Solving Problem Solving: 3-Solves  basic 50 - 74% of the time/requires cueing 25 - 49% of the time  Memory Memory: 3-Recognizes or  recalls 50 - 74% of the time/requires cueing 25 - 49% of the time   Medical Problem List and Plan: 1. 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 DVT Prophylaxis/Anticoagulation: Subcutaneous heparin initiated 05/24/2014. Monitor for any bleeding episodes 3. Pain Management: Tylenol as needed 4. Seizure prophylaxis. Keppra 500 mg twice a day. EEG negative 5. Neuropsych: This patient is not capable of making decisions on her own behalf. 6. Skin/Wound Care: Routine skin checks 7. Fluids/Electrolytes/Nutrition: Strict I knows of follow-up chemistries 8. Dysphagia. Dysphagia #2 nectar liquids. Monitor hydration follow-up speech therapy 9. MRSA PCR screening positive. Contact precautions 10. Hypertension. Toprol-XL 25 mg daily. Monitor with increased mobility 11. Hypothyroidism. Synthroid LOS (Days) 18 A FACE TO FACE EVALUATION WAS PERFORMED  KIRSTEINS,ANDREW E 06/21/2014, 10:22 AM

## 2014-06-21 NOTE — Progress Notes (Signed)
Occupational Therapy Session Note  Patient Details  Name: Carolyn [name] MRN: [id number] Date of Birth: 1946/01/22  Today's Date: 06/21/2014 OT Individual Time: 0900-1000 OT Individual Time Calculation (min): 60 min    Short Term Goals: Week 3:  OT Short Term Goal 1 (Week 3): Pt will complete LB dressing with max A+1 OT Short Term Goal 2 (Week 3): Pt will consistently complete toilet transfer with max A and second person present for stabilizing w/c OT Short Term Goal 3 (Week 3): Pt will verbalize type of clothing during dressing task with 75% accuracy OT Short Term Goal 4 (Week 3): Pt will scan to right to locate 50% of self-care items with mod cues OT Short Term Goal 5 (Week 3): Pt will complete functional activity in standing for 75 seconds with max A for standing balance  Skilled Therapeutic Interventions/Progress Updates:    Pt seen for ADL retraining with focus on functional transfers, postural control, sit<>stand, attention to R, and safety. Pt received supine in bed. Completed supine>sit with mod A and max cues for sequencing. Completed squat pivot transfer bed>w/c with max A and pt initiating transfer. Pt completed bathing sit<>stand at sink with mod-max cues for visually scanning to R to locate items and washing RUE. Pt completed sit<>stand 8-9x during session secondary to incontinent episodes of bowel during transitional movements and fatigue. Pt required mod A-total A +2 for sit<>stand during hygiene and clothing management. Utilized mirror and manual facilitation for postural control secondary to pushing with LUE. Pt very impulsive when she was incontinent, requiring +2 assist for safety. MD and RN notified of multiple BMs. At end of session pt left sitting in w/c at RN's station. Therapist provided half lap tray for RUE.   Therapy Documentation Precautions:  Precautions Precautions: Fall Precaution Comments: R field cut/R inattention, pushes to R  Restrictions Weight Bearing  Restrictions: No General:   Vital Signs: Therapy Vitals Temp: 98 F (36.7 C) Temp Source: Oral Pulse Rate: 79 Resp: 18 BP: (!) 147/53 mmHg Patient Position (if appropriate): Lying Oxygen Therapy SpO2: 100 % O2 Device: Not Delivered Pain: Pain Assessment Pain Assessment: No/denies pain ADL: ADL ADL Comments: Refer to FIM Exercises:   Other Treatments:    See FIM for current functional status  Therapy/Group: Individual Therapy  Daneil Danerkinson, Samayra Hebel N 06/21/2014, 10:21 AM

## 2014-06-21 NOTE — Progress Notes (Signed)
Speech Language Pathology Daily Session Note  Patient Details  Name: Carolyn [name] MRN: [id number] Date of Birth: 03-10-1945  Today's Date: 06/21/2014 SLP Individual Time: 1100-1155 SLP Individual Time Calculation (min): 55 min  Short Term Goals: Week 3: SLP Short Term Goal 1 (Week 3): Patient will consume current diet without overt s/s of aspiration with Min A verbal cues for use of swallow strategies.  SLP Short Term Goal 2 (Week 3): Patient will scan to the right field of enviornment with Min A verbal cues during functional and familiar tasks. SLP Short Term Goal 3 (Week 3): Patient will demonstrate selective attention to functional and familiar tasks for 30 minutes with Min A verbal cues for redirection.  SLP Short Term Goal 4 (Week 3): Patient will name functional items with Mod A phonemic cues with 75% accuracy.  SLP Short Term Goal 5 (Week 3): Patient will demonstrate reading comprehension at the phrase level with Mod A multimodal cues with 75% accruacy.  SLP Short Term Goal 6 (Week 3): Patient will verbalize wants/needs at the phrase level with Mod A multimodal cues.   Skilled Therapeutic Interventions: Skilled treatment session focused on cognitive-linguistic goals. Patient appeared fatigued and lethargic which impacted her overall functional communication throughout the session. Patient was able to make her basic needs known in regards to being "tired" and wanting to go to bed but required Max A multimodal cues for naming functional items/pictures during structured tasks. Patient participated in a basic card task with 100% accuracy with supervision verbal cues and required total A for problem solving with a mildly complex, new learning card task. [facility] and left with all needs within reach and alarm on. Continue with current plan of care.  FIM:  Comprehension Comprehension Mode: Auditory Comprehension: 5-Understands basic 90% of the time/requires  cueing < 10% of the time Expression Expression Mode: Verbal Expression: 3-Expresses basic 50 - 74% of the time/requires cueing 25 - 50% of the time. Needs to repeat parts of sentences. Social Interaction Social Interaction: 5-Interacts appropriately 90% of the time - Needs monitoring or encouragement for participation or interaction. Problem Solving Problem Solving: 3-Solves basic 50 - 74% of the time/requires cueing 25 - 49% of the time Memory Memory: 3-Recognizes or recalls 50 - 74% of the time/requires cueing 25 - 49% of the time  Pain Pain Assessment Pain Assessment: No/denies pain  Therapy/Group: Individual Therapy  Brittlyn Cloe 06/21/2014, 1:04 PM

## 2014-06-21 NOTE — Progress Notes (Signed)
Physical Therapy Session Note  Patient Details  Name: Carolyn [name] MRN: [id number] Date of Birth: 03/08/1945  Today's Date: 06/21/2014 PT Individual Time: 1400-1524 PT Individual Time Calculation (min): 84 min   Short Term Goals: Week 3:  PT Short Term Goal 1 (Week 3): Patient will perform bed mobility with consistent mod A x1 with use of hospital bed functions.  PT Short Term Goal 2 (Week 3): Patient will perform bed <> wheelchair transfer with consistent max A x 1 with +2 to stabilize wheelchair.  PT Short Term Goal 3 (Week 3): Patient will tolerate standing x 60 sec with max A x 1.  PT Short Term Goal 4 (Week 3): Patient will ambulate 25 ft with assist as needed.  PT Short Term Goal 5 (Week 3): Patient will initiate stair training.   Skilled Therapeutic Interventions/Progress Updates:   Session focused on bed mobility, functional transfers, ambulation, sit <> stand transfers, sitting balance, balance reactions, midline orientation, and activity tolerance. Patient received semi reclined in bed, requesting to use bed pan. Patient performed rolling to R with min A and to L with mod A, total A +2 for clothing management and hygiene. Patient propelled wheelchair using L hemi technique x 150 ft with min A, total multimodal cues for sequencing and technique as well as decreasing pace to increase coordination. Gait using hemiwalker x 15 ft + 5 ft with +3A, therapist advancing/stabilizing RLE, +2 for wheelchair follow, and +3 for assisting with sequencing advancement HW. Patient attempted tall kneeling with total A x 3 and small bench in front of patient, but unable to tolerate position and returned to sitting in wheelchair. Performed sit <> stand transfers from mat and wheelchair with max A x 1 to reach for/toss horse shoes using LUE. Patient performed squat pivot transfers with mod-max A x 1 and +2 to stabilize wheelchair. Worked on sitting balance and balance reactions with B feet unsupported on  raised mat, hitting beach ball back and forth to rehab tech, within BOS > outside BOS with  supervision-min A. Patient requested to return to bed at end of session. Patient left semi reclined in bed with all needs within reach, bed alarm on, and RN notified of patient position.    Therapy Documentation Precautions:  Precautions Precautions: Fall Precaution Comments: R field cut/R inattention, pushes to R  Restrictions Weight Bearing Restrictions: No Pain: Pain Assessment Pain Assessment: No/denies pain  See FIM for current functional status  Therapy/Group: Individual Therapy  Kerney ElbeVarner, Travin Marik A 06/21/2014, 3:30 PM

## 2014-06-22 ENCOUNTER — Inpatient Hospital Stay (HOSPITAL_COMMUNITY): Payer: BLUE CROSS/BLUE SHIELD | Admitting: Speech Pathology

## 2014-06-22 ENCOUNTER — Inpatient Hospital Stay (HOSPITAL_COMMUNITY): Payer: BLUE CROSS/BLUE SHIELD | Admitting: Physical Therapy

## 2014-06-22 ENCOUNTER — Inpatient Hospital Stay (HOSPITAL_COMMUNITY): Payer: BLUE CROSS/BLUE SHIELD

## 2014-06-22 NOTE — Patient Care Conference (Signed)
Inpatient RehabilitationTeam Conference and Plan of Care Update Date: 06/22/2014   Time: 3:00 PM    Patient Name: Carolyn [name]      Medical Record Number: [id number]  Date of Birth: [birth year] Sex: Female         Room/Bed: 4W11C/4W11C-01 Payor Info: Payor: BLUE CROSS BLUE [employer]IELD / Plan: BCBS OTHER / Product Type: *No Product type* /    Admitting Diagnosis: NON TRAUMATIC ICH  Admit Date/Time:  06/03/2014  3:45 PM Admission Comments: No comment available   Primary Diagnosis:  Nontraumatic hemorrhage of cerebral hemisphere Principal Problem: Nontraumatic hemorrhage of cerebral hemisphere  Patient Active Problem List   Diagnosis Date Noted  . Embolic stroke   . HLD (hyperlipidemia)   . Aphasia   . Right hemiplegia   . Endotracheal tube present   . Stroke   . 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 Acute respiratory failure, unspecified whether with hypoxia or hypercapnia   . Acute respiratory failure   . Bradycardia 05/23/2014  . Cerebral brain hemorrhage   . Respiratory failure   . Junctional bradycardia   . Arterial hypotension   . Nontraumatic hemorrhage of cerebral hemisphere 05/20/2014  . Hypertensive emergency 05/20/2014    Expected Discharge Date: Expected Discharge Date: 07/01/14  Team Members Present: Physician leading conference: Dr. Claudette Laws Social Worker Present: Amada Jupiter, LCSW Nurse Present: Carmie End, RN PT Present: Bayard Hugger, PT OT Present: Ardis Rowan, Darolyn Rua, OT SLP Present: Feliberto Gottron, SLP PPS Coordinator present : Tora Duck, RN, CRRN     Current Status/Progress Goal Weekly Team Focus  Medical   aphasia clearing, family wants conference, amb plateauing but needs assit x 2 for transfers  +1 assist for xfer  standing   Bowel/Bladder   continent of bowel and bladder- pt uses bedpan. Multiple soft stools. cdiff negative.  manage bowel and bladder mod assist  contiune use of soft call bell. encourgae use of bsc.  monitor bowels   Swallow/Nutrition/ Hydration   Dys. 3 textures with thin liquids, min-mod A for use of swallow strategies  Min A  tolerance of current diet, possible trials of regular textures   ADL's   mod-max UB dressing, total A LB dressing, total +2 toileting and toilet transfers, mod-total A +2 functional transfers with second person stabilizing w/c  mod A overall and min A grooming; supervision sitting balance- will continue to assess if need to downgrade  sitting balance, functional tranfers, midline orientation, postural control, safety, visual scanning, attention to R, activity tolerance   Mobility   supervision-min A sitting balance, mod-max A x 1 sit <> stand, max A bed mobility, mod-max A x 1 with +2 to stabilize wheelchair for squat pivot transfers, gait x 15 ft with HW and +3A, w/c mobility min-mod A  mod-max A overall  sitting balance, balance reactions, midline orientation, functional transfers, ambulation, standing tolerance, attention to R, activity tolerance, pt education   Communication   Overall Mod A for functinal communication   Mod A  verbal expression at phrase level during structured tasks   Safety/Cognition/ Behavioral Observations  Mod A  Min-Mod A  selective attention, attention to right field of enviornment, problem solving, awareness   Pain   no complaints of pain         Skin   incision to left scalp OTA. scabs present. incision clean, dry  To keep skin free of infection min assist  assess skin q shift    Rehab Goals Patient on target to meet  rehab goals: Yes *See Care Plan and progress notes for long and short-term goals.  Barriers to Discharge: famuily cannot provide 24/7    Possible Resolutions to Barriers:  prob SNF    Discharge Planning/Teaching Needs:  home with family providing 24/7 vs possible SNF      Team Discussion:  Making good gains with speech/ aphasia.  Able to express needs.  Working on ambulation (yesterday up with +3 but  "panicked" today when up). Steady progress and hope to get to +1 level for mobility and ADLs.  Feel beneficial to remain through targeted d/c date of 6/9 even with likely change of plan to SNF.  SW attempting to coordinate time for family conference.  Revisions to Treatment Plan:  NA   Continued Need for Acute Rehabilitation Level of Care: The patient requires daily medical management by a physician with specialized training in physical medicine and rehabilitation for the following conditions: Daily direction of a multidisciplinary physical rehabilitation program to ensure safe treatment while eliciting the highest outcome that is of practical value to the patient.: Yes Daily medical management of patient stability for increased activity during participation in an intensive rehabilitation regime.: Yes Daily analysis of laboratory values and/or radiology reports with any subsequent need for medication adjustment of medical intervention for : Neurological problems;Other  Carolyn [name] 06/22/2014, 3:33 PM

## 2014-06-22 NOTE — Progress Notes (Signed)
Speech Language Pathology Daily Session Note  Patient Details  Name: Carolyn [name] MRN: [id number] Date of Birth: March 01, 1945  Today's Date: 06/22/2014 SLP Individual Time: 1255-1355 SLP Individual Time Calculation (min): 60 min  Short Term Goals: Week 3: SLP Short Term Goal 1 (Week 3): Patient will consume current diet without overt s/s of aspiration with Min A verbal cues for use of swallow strategies.  SLP Short Term Goal 2 (Week 3): Patient will scan to the right field of enviornment with Min A verbal cues during functional and familiar tasks. SLP Short Term Goal 3 (Week 3): Patient will demonstrate selective attention to functional and familiar tasks for 30 minutes with Min A verbal cues for redirection.  SLP Short Term Goal 4 (Week 3): Patient will name functional items with Mod A phonemic cues with 75% accuracy.  SLP Short Term Goal 5 (Week 3): Patient will demonstrate reading comprehension at the phrase level with Mod A multimodal cues with 75% accruacy.  SLP Short Term Goal 6 (Week 3): Patient will verbalize wants/needs at the phrase level with Mod A multimodal cues.   Skilled Therapeutic Interventions: Skilled treatment session focused on dysphagia and cognitive goals. Upon arrival, patient was sitting upright in her wheelchair consuming her lunch meal of Dys. 3 textures with thin liquids. Patient consumed meal without overt s/s of aspiration and required supervision verbal cues for use of swallow strategies and Min A verbal cues for functional problem solving with self-feeding. Patient participated in a functional conversation about biographical information in regards to her family and PLOF. Patient was able to express mildly abstract thoughts/information at the phrase level with extra time and Mod A multimodal cues. Patient's errors consisted of phonemic paraphasias, neologisms and perseveration. However, patient required Max-Total A to verbalize at the phrase level with structured  tasks in regards to describing pictures and comparing/contrasting functional items. Suspect increased difficulty was due to the abstract nature of the tasks. Patient left at RN station with quick release belt in place. Continue with current plan of care.     FIM:  Comprehension Comprehension Mode: Auditory Comprehension: 4-Understands basic 75 - 89% of the time/requires cueing 10 - 24% of the time Expression Expression Mode: Verbal Expression: 3-Expresses basic 50 - 74% of the time/requires cueing 25 - 50% of the time. Needs to repeat parts of sentences. Social Interaction Social Interaction: 5-Interacts appropriately 90% of the time - Needs monitoring or encouragement for participation or interaction. Problem Solving Problem Solving: 2-Solves basic 25 - 49% of the time - needs direction more than half the time to initiate, plan or complete simple activities Memory Memory: 3-Recognizes or recalls 50 - 74% of the time/requires cueing 25 - 49% of the time FIM - Eating Eating Activity: 5: Set-up assist for open containers;5: Set-up assist for cut food;5: Supervision/cues  Pain Pain Assessment Pain Assessment: No/denies pain  Therapy/Group: Individual Therapy  Jules Vidovich 06/22/2014, 4:12 PM

## 2014-06-22 NOTE — Progress Notes (Signed)
Physical Therapy Session Note  Patient Details  Name: Carolyn CourtGeraldine Gulley MRN: [id number] Date of Birth: 1945/07/25  Today's Date: 06/22/2014 PT Individual Time: 1000-1100 PT Individual Time Calculation (min): 60 min   Short Term Goals: Week 3:  PT Short Term Goal 1 (Week 3): Patient will perform bed mobility with consistent mod A x1 with use of hospital bed functions.  PT Short Term Goal 2 (Week 3): Patient will perform bed <> wheelchair transfer with consistent max A x 1 with +2 to stabilize wheelchair.  PT Short Term Goal 3 (Week 3): Patient will tolerate standing x 60 sec with max A x 1.  PT Short Term Goal 4 (Week 3): Patient will ambulate 25 ft with assist as needed.  PT Short Term Goal 5 (Week 3): Patient will initiate stair training.   Skilled Therapeutic Interventions/Progress Updates:   Session focused on midline orientation, postural control, gait, functional mobility, sit <> stand, standing balance, R NMR, and activity tolerance. Patient propelled wheelchair using L hemi technique x 100 ft with min A via downward pressure through LLE, cues for sequencing and technique. Attempted gait with use of hemiwalker but patient unable to follow sequencing directions, anticipate due to increased fear of falling, stating, "I can't," with B knee buckling due to stepping out of sequence. Transitioned to gait with use of L rail in hallway x 5 ft + 15 ft with max A for advancing/stabilizing RLE and total verbal cues for sequencing sliding L hand forward and stepping with LLE with +2 assist for wheelchair follow.  Performed squat pivot transfers wheelchair <> mat with max A and +2 to stabilize wheelchair, cues for forward weight shift, hand placement, and head hips relationship. Performed sit <> stand from mat with max A x 1 and total cues for safe hand placement. In standing with LUE support from rehab tech, patient performed step taps to 4" step using LLE with therapist stabilizing RLE. Patient able to  perform x2 before requiring seated rest break. Attempted stepping activity again but patient unable to safely perform, therefore worked on standing with lateral weight shifting to R and L with +2A. Switched out wheelchair cushion for manual wheelchair for improved sitting tolerance. Patient left sitting in wheelchair with quick release belt and 1/2 lap tray on at RN station.   Therapy Documentation Precautions:  Precautions Precautions: Fall Precaution Comments: R field cut/R inattention, pushes to R  Restrictions Weight Bearing Restrictions: No Pain: Pain Assessment Pain Assessment: No/denies pain Pain Score: 0-No pain  See FIM for current functional status  Therapy/Group: Individual Therapy  Kerney ElbeVarner, Emon Lance A 06/22/2014, 10:41 AM

## 2014-06-22 NOTE — Progress Notes (Signed)
Physical Therapy Session Note  Patient Details  Name: Carolyn [name] MRN: [id number] Date of Birth: 26-Nov-1945  Today's Date: 06/22/2014 PT Individual Time: 1530-1600 PT Individual Time Calculation (min): 30 min   Short Term Goals: Week 3:  PT Short Term Goal 1 (Week 3): Patient will perform bed mobility with consistent mod A x1 with use of hospital bed functions.  PT Short Term Goal 2 (Week 3): Patient will perform bed <> wheelchair transfer with consistent max A x 1 with +2 to stabilize wheelchair.  PT Short Term Goal 3 (Week 3): Patient will tolerate standing x 60 sec with max A x 1.  PT Short Term Goal 4 (Week 3): Patient will ambulate 25 ft with assist as needed.  PT Short Term Goal 5 (Week 3): Patient will initiate stair training.   Skilled Therapeutic Interventions/Progress Updates:    Patient seen with focus on right side awareness, activation and weight shift.  Performed bed to wheelchair to right max assist +2 for safety.  Sit<>stand at sink 3 times with max assist and standing weight shifts with assist for right LE stabilization.  Stand to squat x 3 reps.  Transferred back to bed mod assist to left and to left sidelying with max to total assist.  Sidelying pelvic diagonals AAROM to RROM for strengthening and awareness.  Attempted to progress to left sidelying propped on left elbow, but pt unable to transition.  Returned to supine with all needs within reach.    Therapy Documentation Precautions:  Precautions Precautions: Fall Precaution Comments: R field cut/R inattention, pushes to R  Restrictions Weight Bearing Restrictions: No  Pain: Pain Assessment Pain Assessment: No/denies pain      See FIM for current functional status  Therapy/Group: Individual Therapy  Rudi CocoWYNN,CYNDI  Cyndi BennettsvilleWynn, [place]PT [phone] 06/22/2014  06/22/2014, 4:16 PM

## 2014-06-22 NOTE — Progress Notes (Signed)
Occupational Therapy Session Note  Patient Details  Name: Carolyn [name] MRN: [id number] Date of Birth: 08-15-1945  Today's Date: 06/22/2014 OT Individual Time: [id number] OT Individual Time Calculation (min): 57 min    Short Term Goals: Week 3:  OT Short Term Goal 1 (Week 3): Pt will complete LB dressing with max A+1 OT Short Term Goal 2 (Week 3): Pt will consistently complete toilet transfer with max A and second person present for stabilizing w/c OT Short Term Goal 3 (Week 3): Pt will verbalize type of clothing during dressing task with 75% accuracy OT Short Term Goal 4 (Week 3): Pt will scan to right to locate 50% of self-care items with mod cues OT Short Term Goal 5 (Week 3): Pt will complete functional activity in standing for 75 seconds with max A for standing balance  Skilled Therapeutic Interventions/Progress Updates:    Pt seen for ADL retraining with focus on functional transfers, standing balance, postural control in sitting and standing, attention to R, and activity tolerance. Pt received supine in bed oriented x4 with mod cues. Pt completed supine>sit with max A and squat pivot transfer bed>w/c with max A and second person present to stabilize w/c. Completed bathing at sink with min multimodal cues for locating items on R side and attention to RUE. Pt completed dressing sit<>stand with mod-max A standing balance and total A for clothing management. Utilized mirror and manual facilitation to achieve upright posture in standing. Also removed LUE from sink and pt noted to have much less pushing tendencies when placing in therapist's hand. Pt verbalized type of clothing with min cues and 75% accuracy. Pt continues to require max cues for hemi dressing technique. Completed standing activity at sink with focus on weight shifting and balance as pt advanced LLE forward and back with max A for balance and total A to manage RLE from buckling. Pt left sitting in w/c at nurses station with all  needs in reach.   Therapy Documentation Precautions:  Precautions Precautions: Fall Precaution Comments: R field cut/R inattention, pushes to R  Restrictions Weight Bearing Restrictions: No General:   Vital Signs:   Pain: Pain Assessment Pain Assessment: No/denies pain Pain Score: 0-No pain  See FIM for current functional status  Therapy/Group: Individual Therapy  Daneil Danerkinson, Peregrine Nolt N 06/22/2014, 12:16 PM

## 2014-06-22 NOTE — Progress Notes (Signed)
69 y.o. right handed female with history of hypertension, recent status post AAA stent grafting at [facility] and was maintained on aspirin and Plavix.. 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 Admitted 05/20/2014 after after developing severe headache right arm weakness while at work and suddenly became unresponsive. Blood pressure 241/123. Cranial CT scan showed left ICH with 3 mm midline shift Subjective/Complaints: Pt working with OT remains aphasic, seemed to have pain during transfer  ROS: Cannot obtain secondary to mental status, aphasia  Objective: Vital Signs: Blood pressure 137/63, pulse 84, temperature 98 F (36.7 C), temperature source Oral, resp. rate 18, weight 79.5 kg (175 lb 4.3 oz), SpO2 97 %. No results found. No results found for this or any previous visit (from the past 72 hour(s)).   HEENT: left scalp wound with scab no evidence of drainage Cardio: RRR and no murmurs Resp: CTA B/L and unlabored GI: BS positive and nontender, nondistended Extremity:  Pulses positive and No Edema Skin:   Wound C/D/I and left scalp Neuro: Confused, Abnormal Sensory reduced sensation right upper extremity, Abnormal Motor 0/5 right upper extremity, 0/5 left lower extremity and Aphasic Musc/Skel:  Other no pain with right shoulder range of motion Gen. no acute distress   Assessment/Plan: 1. Functional deficits secondary to left intracranial hemorrhage status post evacuation 05/20/2014 with right hemiplegia and aphasia which require 3+ hours per day of interdisciplinary therapy in a comprehensive inpatient rehab setting. Physiatrist is providing close team supervision and 24 hour management of active medical problems listed below. Physiatrist and rehab team continue to assess barriers to discharge/monitor patient progress toward functional and medical goals. FIM: FIM - Bathing Bathing Steps Patient Completed: Chest,  Abdomen, Left Arm, Right upper leg, Left upper leg Bathing: 1: Two helpers  FIM - Upper Body Dressing/Undressing Upper body dressing/undressing steps patient completed: Thread/unthread left bra strap, Thread/unthread left sleeve of pullover shirt/dress, Put head through opening of pull over shirt/dress, Thread/unthread right bra strap Upper body dressing/undressing: 3: Mod-Patient completed 50-74% of tasks FIM - Lower Body Dressing/Undressing Lower body dressing/undressing steps patient completed: Thread/unthread left pants leg Lower body dressing/undressing: 1: Total-Patient completed less than 25% of tasks  FIM - Hotel managerToileting Toileting Assistive Devices: Grab bar or rail for support Toileting: 1: Two helpers  FIM - Diplomatic Services operational officerToilet Transfers Toilet Transfers Assistive Devices: PsychiatristBedside commode Toilet Transfers: 1-Two helpers  FIM - BankerBed/Chair Transfer Bed/Chair Transfer Assistive Devices: Arm rests, Bed rails Bed/Chair Transfer: 2: Supine > Sit: Max A (lifting assist/Pt. 25-49%), 1: Two helpers, 2: Bed > Chair or W/C: Max A (lift and lower assist)  FIM - Locomotion: Wheelchair Distance: 5 ft Locomotion: Wheelchair: 2: Travels 50 - 149 ft with minimal assistance (Pt.>75%) FIM - Locomotion: Ambulation Locomotion: Ambulation Assistive Devices: Walker - Hemi, Other (comment) (L rail in hallway) Ambulation/Gait Assistance: 1: +2 Total assist Locomotion: Ambulation: 1: Two helpers  Comprehension Comprehension Mode: Auditory Comprehension: 4-Understands basic 75 - 89% of the time/requires cueing 10 - 24% of the time  Expression Expression Mode: Verbal Expression: 3-Expresses basic 50 - 74% of the time/requires cueing 25 - 50% of the time. Needs to repeat parts of sentences.  Social Interaction Social Interaction: 5-Interacts appropriately 90% of the time - Needs monitoring or encouragement for participation or interaction.  Problem Solving Problem Solving: 2-Solves basic 25 - 49% of the time -  needs direction more than half the time to initiate, plan or complete simple activities  Memory Memory: 3-Recognizes or  recalls 50 - 74% of the time/requires cueing 25 - 49% of the time   Medical Problem List and Plan: 1. 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 DVT Prophylaxis/Anticoagulation: Subcutaneous heparin initiated 05/24/2014. Monitor for any bleeding episodes 3. Pain Management: Tylenol as needed 4. Seizure prophylaxis. Keppra 500 mg twice a day. EEG negative 5. Neuropsych: This patient is not capable of making decisions on her own behalf. 6. Skin/Wound Care: Routine skin checks 7. Fluids/Electrolytes/Nutrition: Strict I knows of follow-up chemistries 8. Dysphagia. Dysphagia #2 nectar liquids. Monitor hydration follow-up speech therapy 9. MRSA PCR screening positive. Contact precautions 10. Hypertension. Toprol-XL 25 mg daily. Monitor with increased mobility 11. Hypothyroidism. Synthroid LOS (Days) 19 A FACE TO FACE EVALUATION WAS PERFORMED  Charise Leinbach E 06/22/2014, 2:33 PM

## 2014-06-23 ENCOUNTER — Inpatient Hospital Stay (HOSPITAL_COMMUNITY): Payer: BLUE CROSS/BLUE SHIELD | Admitting: Physical Therapy

## 2014-06-23 ENCOUNTER — Inpatient Hospital Stay (HOSPITAL_COMMUNITY): Payer: BLUE CROSS/BLUE SHIELD | Admitting: Speech Pathology

## 2014-06-23 ENCOUNTER — Inpatient Hospital Stay (HOSPITAL_COMMUNITY): Payer: BLUE CROSS/BLUE SHIELD

## 2014-06-23 MED ORDER — BOOST / RESOURCE BREEZE PO LIQD
1.0000 | Freq: Two times a day (BID) | ORAL | Status: DC
  Administered 2014-06-29: 1 via ORAL

## 2014-06-23 NOTE — Progress Notes (Signed)
69 y.o. right handed female with history of hypertension, recent status post AAA stent grafting at [facility] and was maintained on aspirin and Plavix.. 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 Admitted 05/20/2014 after after developing severe headache right arm weakness while at work and suddenly became unresponsive. Blood pressure 241/123. Cranial CT scan showed left ICH with 3 mm midline shift Subjective/Complaints:   Uneventful night. Pain under reasonable control at present. Some pain with activity it appears.  ROS: Cannot obtain secondary to mental status, aphasia  Objective: Vital Signs: Blood pressure 154/61, pulse 80, temperature 98.1 F (36.7 C), temperature source Oral, resp. rate 18, weight 80.922 kg (178 lb 6.4 oz), SpO2 91 %. No results found. No results found for this or any previous visit (from the past 72 hour(s)).   HEENT: left scalp wound with scab no evidence of drainage Cardio: RRR and no murmurs Resp: CTA B/L and unlabored GI: BS positive and nontender, nondistended Extremity:  Pulses positive and No Edema Skin:   Wound C/D/I and left scalp Neuro: Confused, Abnormal Sensory reduced sensation right upper extremity, Abnormal Motor 0/5 right upper extremity, 0/5 left lower extremity and Aphasic Musc/Skel:  Other no pain with right shoulder range of motion Gen. no acute distress   Assessment/Plan: 1. Functional deficits secondary to left intracranial hemorrhage status post evacuation 05/20/2014 with right hemiplegia and aphasia which require 3+ hours per day of interdisciplinary therapy in a comprehensive inpatient rehab setting. Physiatrist is providing close team supervision and 24 hour management of active medical problems listed below. Physiatrist and rehab team continue to assess barriers to discharge/monitor patient progress toward functional and medical goals.  May need short term SNF.     FIM: FIM - Bathing Bathing Steps Patient Completed: Chest, Abdomen, Left Arm, Right upper leg, Left upper leg Bathing: 1: Two helpers  FIM - Upper Body Dressing/Undressing Upper body dressing/undressing steps patient completed: Thread/unthread left bra strap, Thread/unthread left sleeve of pullover shirt/dress, Put head through opening of pull over shirt/dress, Thread/unthread right bra strap Upper body dressing/undressing: 3: Mod-Patient completed 50-74% of tasks FIM - Lower Body Dressing/Undressing Lower body dressing/undressing steps patient completed: Thread/unthread left pants leg Lower body dressing/undressing: 1: Total-Patient completed less than 25% of tasks  FIM - Hotel managerToileting Toileting Assistive Devices: Grab bar or rail for support Toileting: 1: Two helpers  FIM - Diplomatic Services operational officerToilet Transfers Toilet Transfers Assistive Devices: PsychiatristBedside commode Toilet Transfers: 1-Two helpers  FIM - BankerBed/Chair Transfer Bed/Chair Transfer Assistive Devices: Arm rests, Bed rails Bed/Chair Transfer: 2: Supine > Sit: Max A (lifting assist/Pt. 25-49%), 1: Sit > Supine: Total A (helper does all/Pt. < 25%), 3: Chair or W/C > Bed: Mod A (lift or lower assist), 2: Bed > Chair or W/C: Max A (lift and lower assist)  FIM - Locomotion: Wheelchair Distance: 5 ft Locomotion: Wheelchair: 2: Travels 50 - 149 ft with minimal assistance (Pt.>75%) FIM - Locomotion: Ambulation Locomotion: Ambulation Assistive Devices: Walker - Hemi, Other (comment) (L rail in hallway) Ambulation/Gait Assistance: 1: +2 Total assist Locomotion: Ambulation: 1: Two helpers  Comprehension Comprehension Mode: Auditory Comprehension: 4-Understands basic 75 - 89% of the time/requires cueing 10 - 24% of the time  Expression Expression Mode: Verbal Expression: 3-Expresses basic 50 - 74% of the time/requires cueing 25 - 50% of the time. Needs to repeat parts of sentences.  Social Interaction Social Interaction: 5-Interacts appropriately 90%  of the time - Needs monitoring or encouragement for participation or interaction.  Problem Solving Problem Solving: 2-Solves basic 25 - 49% of the time - needs direction more than half the time to initiate, plan or complete simple activities  Memory Memory: 3-Recognizes or recalls 50 - 74% of the time/requires cueing 25 - 49% of the time   Medical Problem List and Plan: 1. 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 DVT Prophylaxis/Anticoagulation: Subcutaneous heparin initiated 05/24/2014. Monitor for any bleeding episodes 3. Pain Management: Tylenol as needed 4. Seizure prophylaxis. Keppra 500 mg twice a day. EEG negative 5. Neuropsych: This patient is not capable of making decisions on her own behalf. 6. Skin/Wound Care: Routine skin checks 7. Fluids/Electrolytes/Nutrition: Strict I knows of follow-up chemistries 8. Dysphagia. Dysphagia #3 thin liquids. Encourage fluids 9. MRSA PCR screening positive. Contact precautions 10. Hypertension. Toprol-XL 25 mg daily. Monitor with increased mobility 11. Hypothyroidism. Synthroid LOS (Days) 20 A FACE TO FACE EVALUATION WAS PERFORMED  SWARTZ,ZACHARY T 06/23/2014, 7:35 AM

## 2014-06-23 NOTE — Progress Notes (Signed)
Physical Therapy Weekly Progress Note  Patient Details  Name: Carolyn [name] MRN: [id number] Date of Birth: 07-May-1945  Beginning of progress report period: Jun 17, 2014 End of progress report period: June 23, 2014  Today's Date: 06/23/2014 PT Individual Time: 1000-1100 and 1305-1330 PT Individual Time Calculation (min): 60 min and 25 min   Patient has met 3 of 4 short term goals.  Patient has demonstrated progress this reporting period with progression of ambulation with use of hemiwalker or LUE support on rail in hallway between 10-30 ft with 2-3 persons assisting for safety. Patient has also initiated stair training with +2 assist for safety. Patient currently requires mod-max A with +2 to stabilize wheelchair for safety with squat pivot transfers, mod-max A for bed mobility, mod-max A for sit <> stand and standing balance, improved sitting balance with overall supervision and intermittent min A, and min A for wheelchair mobility via L hemi technique. Patient demonstrates improved activity tolerance with decreased need for rest breaks and improved postural control with ability to transition out of tilt in space wheelchair with head rest support to manual wheelchair.   Patient continues to demonstrate the following deficits: RUE/RLE hemiplegia, muscle weakness, decreased sitting/standing balance, decreased balance reactions, pushing tendencies to R, decreased postural control, decreased endurance, impaired timing and sequencing, abnormal tone, unbalanced muscle activation, motor apraxia, decreased coordination, decreased motor planning, decreased sensation, decreased midline orientation, decreased attention to right, aphasia, decreased initiation, decreased attention, decreased awareness, decreased problem solving, decreased safety awareness, decreased memory, and delayed processing and therefore will continue to benefit from skilled PT intervention to enhance overall performance with activity  tolerance, balance, postural control, ability to compensate for deficits, functional use of  right upper extremity and right lower extremity, attention, awareness and coordination.  Patient progressing toward long term goals.  Continue plan of care.   PT Short Term Goals Week 3:  PT Short Term Goal 1 (Week 3): Patient will perform bed mobility with consistent mod A x1 with use of hospital bed functions.  PT Short Term Goal 1 - Progress (Week 3): Progressing toward goal PT Short Term Goal 2 (Week 3): Patient will perform bed <> wheelchair transfer with consistent max A x 1 with +2 to stabilize wheelchair.  PT Short Term Goal 2 - Progress (Week 3): Met PT Short Term Goal 3 (Week 3): Patient will tolerate standing x 60 sec with max A x 1.  PT Short Term Goal 3 - Progress (Week 3): Met PT Short Term Goal 4 (Week 3): Patient will ambulate 25 ft with assist as needed.  PT Short Term Goal 4 - Progress (Week 3): Met PT Short Term Goal 5 (Week 3): Patient will initiate stair training.  Week 4:  PT Short Term Goal 1 (Week 4): = LTGs due to anticipated LOS  Skilled Therapeutic Interventions/Progress Updates:   Treatment 1: Session focused on functional ambulation, transfers, sitting balance, R NMR, and standing tolerance. Gait training using L rail in hallway x 20 ft with max A and +2 for wheelchair follow, progressed to gait with use of hemiwalker x 12 ft with max A and +3 for wheelchair follow and assist for safely advancing hemiwalker. Patient required max verbal cues for sequencing and total A for advancing/stabilizing RLE with step-to pattern. Patient demonstrated improved upright posture, decreased pushing to R, and improved sequencing compared to yesterday. Seated in wheelchair, patient performed purposeful right LAQ x 5, demonstrating active movement in RLE for first time. Patient also with noted activation  in R hip with reciprocal scooting to reposition in wheelchair. Performed squat pivot transfer  wheelchair <> mat table with mod A and +2 to stabilize wheelchair for safety. R NMR: supine bridging 2 x 10 and attempted hooklying adduction. Patient c/o muscle spasm in L hip and requested to return to sitting. Performed bed mobility on mat table with mod A and cues for sequencing. Active rest break with sitting balance at supervision level. Patient performed sit <> stand from mat with mod A overall to work on standing tolerance up to 60 sec with cognitive reaching task to facilitate weight shift to L from field of 2. Patient fatigued quickly with increased anxiety in standing. Patient left sitting in wheelchair with quick release belt on at RN station, RN and NT notified of patient request to return to bed.   Treatment 2: Patient received semi reclined in bed, requesting to urinate. Performed rolling to R with supervision and cues for use of rail and to L with mod A and total A for clothing management and placing bed pan. Patient performed hygiene with setup assist. Patient transferred to edge of bed with HOB flat and use of rail with mod A and cues for sequencing. Attempted squat pivot transfer bed > wheelchair with assist of 1 person, max A but chair moved unsafely away from bed. Recommend continued use of +2 for transfers to stabilize wheelchair. Patient repositioned in wheelchair via reciprocal scooting with mod A. Performed wheelchair mobility via L hemi technique x 50 ft with mod cues for sequencing and S-min A with downward pressure through RLE. Patient left sitting in wheelchair with quick release belt on at RN station.    Therapy Documentation Precautions:  Precautions Precautions: Fall Precaution Comments: R field cut/R inattention, pushes to R  Restrictions Weight Bearing Restrictions: No Pain: Pain Assessment Pain Assessment: No/denies pain  See FIM for current functional status  Therapy/Group: Individual Therapy  Laretta Alstrom 06/23/2014, 12:48 PM

## 2014-06-23 NOTE — Progress Notes (Signed)
Speech Language Pathology Daily Session Note  Patient Details  Name: Carolyn [name] MRN: [id number] Date of Birth: 03-18-1945  Today's Date: 06/23/2014 SLP Individual Time: 1400-1500 SLP Individual Time Calculation (min): 60 min  Short Term Goals: Week 3: SLP Short Term Goal 1 (Week 3): Patient will consume current diet without overt s/s of aspiration with Min A verbal cues for use of swallow strategies.  SLP Short Term Goal 2 (Week 3): Patient will scan to the right field of enviornment with Min A verbal cues during functional and familiar tasks. SLP Short Term Goal 3 (Week 3): Patient will demonstrate selective attention to functional and familiar tasks for 30 minutes with Min A verbal cues for redirection.  SLP Short Term Goal 4 (Week 3): Patient will name functional items with Mod A phonemic cues with 75% accuracy.  SLP Short Term Goal 5 (Week 3): Patient will demonstrate reading comprehension at the phrase level with Mod A multimodal cues with 75% accruacy.  SLP Short Term Goal 6 (Week 3): Patient will verbalize wants/needs at the phrase level with Mod A multimodal cues.   Skilled Therapeutic Interventions: Skilled treatment session focused on cognitive-linguistic goals. SLP facilitated session by providing Min A phonemic and semantic cues for word-finding during a verbal description task at the word level. However, patient required Max A multimodal cues to provide semantic cues when given a specific word at the phrase level. Patient also required Mod A semantic and verbal cues for generative naming task. Patient participated in a functional conversation with focus on events from morning therapy sessions and family with Mod A multimodal cues needed for verbal expression. Overall, patient's errors consist of phonemic paraphasias, neologisms and perseveration. At end of session, patient requested to get back into bed. [facility] and left in bed with alarm on and all needs  within reach. Continue with current plan of care.    FIM:  Comprehension Comprehension Mode: Auditory Comprehension: 4-Understands basic 75 - 89% of the time/requires cueing 10 - 24% of the time Expression Expression Mode: Verbal Expression: 3-Expresses basic 50 - 74% of the time/requires cueing 25 - 50% of the time. Needs to repeat parts of sentences. Social Interaction Social Interaction: 5-Interacts appropriately 90% of the time - Needs monitoring or encouragement for participation or interaction. Problem Solving Problem Solving: 2-Solves basic 25 - 49% of the time - needs direction more than half the time to initiate, plan or complete simple activities Memory Memory: 3-Recognizes or recalls 50 - 74% of the time/requires cueing 25 - 49% of the time FIM - Eating Eating Activity: 5: Set-up assist for open containers;5: Set-up assist for cut food;5: Supervision/cues  Pain Pain Assessment Pain Assessment: No/denies pain  Therapy/Group: Individual Therapy  Rony Ratz 06/23/2014, 4:03 PM

## 2014-06-23 NOTE — Progress Notes (Signed)
Occupational Therapy Weekly Progress Note  Patient Details  Name: Carolyn [name] MRN: [id number] Date of Birth: [birth year]  Beginning of progress report period: Jun 17, 2014 End of progress report period: June 23, 2014  Today's Date: 06/23/2014 OT Individual Time: 0800-0900 OT Individual Time Calculation (min): 60 min    Patient has met 4 of 5 short term goals.  Patient has made progress during this reporting period. Patient currently requires max A overall with functional transfers bed<>w/c/mat table and w/c<>toilet with second person present for safety. Patient continues to demonstrate improved sitting balance, requiring supervision-occasional min A. Patient demonstrates improved standing balance, requiring mod-max A and improved standing tolerance. Patient demonstrates great improvement of attention to R during bathing and dressing, requiring mod cues for locating items and providing self-care to RUE. Patient demonstrates improved ability to weight shift in chair to increase comfort with min-mod A and verbal cues.  Patient continues to demonstrate improved verbal expression as she consistently communicates toileting needs and types of clothing during therapy sessions. Patient demonstrates overall improved activity tolerance as she requires less rest breaks during therapy sessions and remains in standard w/c for increased time between therapy sessions.   Patient continues to demonstrate the following deficits: R hemiplegia, decreased midline orientation, decreased postural control, decreased sitting and standing balance, decreased core strength, R inattention, decreased awareness, cognitive-linguistic deficits, decreased safety awareness, decreased activity tolerance, decreased strength, aphasia, R field cut, decreased coordination, pushing tendencies to R, poor spatial awareness, decreased sensation, motor apraxia, decreased initiation and therefore will continue to benefit from skilled OT  intervention to enhance overall performance with BADL, Reduce care partner burden and improve midline orientation, postural control, balance, attention to R, and strength.  Patient progressing toward long term goals..  Plan of care revisions: Downgraded LB dressing, toileting, and toilet transfer goals to max A due to slow progress. .    OT Short Term Goals Week 3:  OT Short Term Goal 1 (Week 3): Pt will complete LB dressing with max A+1 OT Short Term Goal 1 - Progress (Week 3): Progressing toward goal OT Short Term Goal 2 (Week 3): Pt will consistently complete toilet transfer with max A and second person present for stabilizing w/c OT Short Term Goal 2 - Progress (Week 3): Met OT Short Term Goal 3 (Week 3): Pt will verbalize type of clothing during dressing task with 75% accuracy OT Short Term Goal 3 - Progress (Week 3): Met OT Short Term Goal 4 (Week 3): Pt will scan to right to locate 50% of self-care items with mod cues OT Short Term Goal 4 - Progress (Week 3): Met OT Short Term Goal 5 (Week 3): Pt will complete functional activity in standing for 75 seconds with max A for standing balance OT Short Term Goal 5 - Progress (Week 3): Met Week 4:  OT Short Term Goal 1 (Week 4): Focus on LTGs due expected discharge date  Skilled Therapeutic Interventions/Progress Updates:    Pt seen for 1:1 OT session with focus on functional transfers, standing balance, midline orientation, activity tolerance, and attention to R. Pt received supine in bed verbalizing need to toilet. Completed squat pivot transfer bed>w/c and w/c<>toilet with max A. Pt required max A sit<>stand during hygiene with second person completing hygiene and clothing management. Pt emotionally labile following activity and with increased time communicated frustration with inability to complete tasks herself. Re-educated on goals of therapy at this time and provided pt with encouragement. Completed grooming tasks at sink and dressing  with  min-mod cues for scanning to left to locate items. Pt with improved carryover of hemi dressing techniques this AM, requiring mod cues for recall and increased time. Pt completed sit<>stand with mod-max A at sink on 2 occasions during clothing management. Pt requesting to toilet again therefore completed squat pivot transfer w/c<>toilet with max A. Pt required min A to manage RUE while sitting on toilet on both occasions. Pt continent of bowel on both occasions. At end of session pt left sitting in w/c at nurses station.   Therapy Documentation Precautions:  Precautions Precautions: Fall Precaution Comments: R field cut/R inattention, pushes to R  Restrictions Weight Bearing Restrictions: No General:   Vital Signs: Therapy Vitals Temp: 98.1 F (36.7 C) Temp Source: Oral Pulse Rate: 80 Resp: 18 BP: (!) 154/61 mmHg Patient Position (if appropriate): Lying Oxygen Therapy SpO2: 91 % O2 Device: Not Delivered Pain: Pain Assessment Pain Score: Asleep ADL: ADL ADL Comments: Refer to FIM Exercises:   Other Treatments:    See FIM for current functional status  Therapy/Group: Individual Therapy  Duayne Cal 06/23/2014, 6:51 AM

## 2014-06-23 NOTE — Plan of Care (Signed)
Problem: RH Dressing Goal: LTG Patient will perform lower body dressing w/assist (OT) LTG: Patient will perform lower body dressing with assist, with/without cues in positioning using equipment (OT)  Downgraded due to slow progress

## 2014-06-23 NOTE — Care Management Note (Signed)
[facility] Individual Statement of Services  Patient Name:  Carolyn [name]  Date:  06/07/2014  [facility] the [facility].  Our goal is to provide you with an individualized program based on your diagnosis and situation, designed to meet your specific needs.  With this comprehensive rehabilitation program, you will be expected to participate in at least 3 hours of rehabilitation therapies Monday-Friday, with modified therapy programming on the weekends.  Your rehabilitation program will include the following services:  Physical Therapy (PT), Occupational Therapy (OT), Speech Therapy (ST), 24 hour per day rehabilitation nursing, Therapeutic Recreaction (TR), Neuropsychology, Case Management (Social Worker), Rehabilitation Medicine, Nutrition Services and Pharmacy Services  Weekly team conferences will be held on Tuesdays to discuss your progress.  Your Social Worker will talk with you frequently to get your input and to update you on team discussions.  Team conferences with you and your family in attendance may also be held.  Expected length of stay: 26-28 days  Overall anticipated outcome: moderate assist  Depending on your progress and recovery, your program may change. Your Social Worker will coordinate services and will keep you informed of any changes. Your Social Worker's name and contact numbers are listed  below.  The following services may also be recommended but are not provided by the [facility]:   Driving Evaluations  Home Health Rehabiltiation Services  Outpatient Rehabilitation Services  Vocational Rehabilitation   Skilled Nursing Facility   Arrangements will be made to provide these services after discharge if needed.  Arrangements include referral to agencies that provide these services.  Your insurance has been verified to be:  BCBS and Medicare Your primary doctor is:  Dr. Reola CalkinsBeck  Pertinent information will  be shared with your doctor and your insurance company.  Social Worker:  Berry[place]Lucy Ryken [place], [place] [phone] or ([phone]) [phone]   Information discussed with and copy given to patient by: Amada JupiterHOYLE, Kamron Vanwyhe, 06/07/2014, 3:01 PM

## 2014-06-23 NOTE — Progress Notes (Signed)
Nutrition Follow-up  DOCUMENTATION CODES:  Obesity unspecified  INTERVENTION:  Boost Breeze, Magic cup, Snacks   Encourage adequate PO intake.   NUTRITION DIAGNOSIS:  Inadequate oral intake related to  (decreased appetite) as evidenced by  (varied meal completion of 25-90%); improving  GOAL:  Patient will meet greater than or equal to 90% of their needs; progressing  MONITOR:  PO intake, Supplement acceptance, Weight trends, Labs, I & O's  REASON FOR ASSESSMENT:  Consult Poor PO  ASSESSMENT: Pt with history of hypertension, recent status post AAA stent grafting. Admitted 05/20/2014 after after developing severe headache right arm weakness while at work and suddenly became unresponsive. Cranial CT scan showed left ICH with 3 mm midline shift. Underwent left frontoparietal craniotomy for evacuation of intracerebral hematoma 05/20/2014.  Pt reports having a good appetite. Meal completion has been varied from 15-100%. Meal completion at lunch today was 100%. Pt has been refusing her Ensure as she does not like them. Pt is agreeable on trying Resource Breeze and Magic cup. RD to modify orders. Pt was encouraged to eat her food at meals and to drink her supplements.  Labs and medications reviewed.   Height:  Ht Readings from Last 1 Encounters:  05/20/14 5\' 4"  (1.626 m)    Weight:  Wt Readings from Last 1 Encounters:  06/23/14 178 lb 6.4 oz (80.922 kg)    Ideal Body Weight:  54.5 kg  Wt Readings from Last 10 Encounters:  06/23/14 178 lb 6.4 oz (80.922 kg)  06/03/14 199 lb 4.7 oz (90.4 kg)  *weight trending down*  BMI:  Body mass index is 30.61 kg/(m^2). Class I obesity  Estimated Nutritional Needs:  Kcal:  1750-1950  Protein:  85-95 grams  Fluid:  1.7 - 1.9 L/day  Skin:   (Incision on L head, +1 RUE, RLE edema)  Diet Order:  DIET DYS 3 Room service appropriate?: Yes; Fluid consistency:: Thin  EDUCATION NEEDS:  No education needs identified at this  time   Intake/Output Summary (Last 24 hours) at 06/23/14 1558 Last data filed at 06/23/14 1258  Gross per 24 hour  Intake    600 ml  Output      0 ml  Net    600 ml    Last BM:  5/30  Marijean NiemannStephanie La, MS, RD, LDN Pager # [phone] After hours/ weekend pager # [phone]

## 2014-06-24 ENCOUNTER — Inpatient Hospital Stay (HOSPITAL_COMMUNITY): Payer: BLUE CROSS/BLUE SHIELD | Admitting: Physical Therapy

## 2014-06-24 ENCOUNTER — Inpatient Hospital Stay (HOSPITAL_COMMUNITY): Payer: BLUE CROSS/BLUE SHIELD

## 2014-06-24 ENCOUNTER — Inpatient Hospital Stay (HOSPITAL_COMMUNITY): Payer: BLUE CROSS/BLUE SHIELD | Admitting: Speech Pathology

## 2014-06-24 NOTE — Progress Notes (Signed)
Physical Therapy Session Note  Patient Details  Name: Carolyn [name] MRN: [id number] Date of Birth: [birth year]  Today's Date: 06/24/2014 PT Individual Time: 1100-1200 PT Individual Time Calculation (min): 60 min   Short Term Goals: Week 4:  PT Short Term Goal 1 (Week 4): = LTGs due to anticipated LOS  Skilled Therapeutic Interventions/Progress Updates:   Session focused on RLE NMR, transfers, attention to R, and activity tolerance. Patient propelled wheelchair using L hemi technique with min [name] provided as downward pressure through LLE x 100 ft with increased time. Performed squat pivot transfers wheelchair <> mat table with max [name] x 1, verbal cues for sequencing and safety due to impulsive behavior. Patient transferred supine <> sit with mod [name] overall to R to simulate home environment, verbal/tactile cues for technique. Supine R NMR: bridging with 3-5 sec hold x 10 with therapist stabilizing RLE, bridging with therapist stabilizing LLE x 10, and bridging with cues to squeeze ball between knees while performing bridging without assist and max verbal/visual cues for LLE hip adduction with bridging. Supine hooklying LLE adduction x 15 within first 50% ROM with verbal/visual cues (no assist needed). Performed LLE D2 PNF patterns with patient initiating hip flexion/extension against resistance to fatigue. Patient requested to return to bed at end of session, left semi reclined with all needs within reach and bed alarm on.     Therapy Documentation Precautions:  Precautions Precautions: Fall Precaution Comments: R field cut/R inattention, pushes to R  Restrictions Weight Bearing Restrictions: No Pain: Pain Assessment Pain Assessment: No/denies pain  See FIM for current functional status  Therapy/Group: Individual Therapy  Kerney ElbeVarner, Carolyn Winstanley [name] 06/24/2014, 12:13 PM

## 2014-06-24 NOTE — Progress Notes (Signed)
Admin pm meds per swallow precautions. Pt began to coughed after one sip of water and asked for another sip to clear her throat. She drank another sip of water and began to choke. Notified RR and RT. Pt BP, R, Hr were elevated but O2 sats remained above 95% and lung sounds were clear during the episode. Was advised by advised by RR and RT to reassess lung sounds in 1 hr and notify them if sats decreased to 92%. Notified PA on-call.

## 2014-06-24 NOTE — Discharge Instructions (Signed)
Inpatient Rehab Discharge Instructions  [name] CourtGeraldine Bucklew Discharge date and time: No discharge date for patient encounter.   Activities/Precautions/ Functional Status: Activity: activity as tolerated Diet: soft Wound Care: none needed Functional status:  ___ No restrictions     ___ Walk up steps independently ___ 24/7 supervision/assistance   ___ Walk up steps with assistance ___ Intermittent supervision/assistance  ___ Bathe/dress independently ___ Walk with walker     ___ Bathe/dress with assistance ___ Walk Independently    ___ Shower independently _x__ Walk with assistance    ___ Shower with assistance ___ No alcohol     ___ Return to work/school ________  Special Instructions:    My questions have been answered and I understand these instructions. I will adhere to these goals and the provided educational materials after my discharge from the hospital.  Patient/Caregiver Signature _______________________________ Date __________  Clinician Signature _______________________________________ Date __________  Please bring this form and your medication list with you to all your follow-up doctor's appointments.

## 2014-06-24 NOTE — Progress Notes (Signed)
Speech Language Pathology Weekly Progress and Session Note  Patient Details  Name: Carolyn [name] MRN: [id number] Date of Birth: [birth year]  Beginning of progress report period: Jun 17, 2014 End of progress report period: June 24, 2014  Today's Date: 06/24/2014 SLP Individual Time: 1300-1400 SLP Individual Time Calculation (min): 60 min  Short Term Goals: Week 3: SLP Short Term Goal 1 (Week 3): Patient will consume current diet without overt s/s of aspiration with Min A verbal cues for use of swallow strategies.  SLP Short Term Goal 1 - Progress (Week 3): Met SLP Short Term Goal 2 (Week 3): Patient will scan to the right field of enviornment with Min A verbal cues during functional and familiar tasks. SLP Short Term Goal 2 - Progress (Week 3): Met SLP Short Term Goal 3 (Week 3): Patient will demonstrate selective attention to functional and familiar tasks for 30 minutes with Min A verbal cues for redirection.  SLP Short Term Goal 3 - Progress (Week 3): Met SLP Short Term Goal 4 (Week 3): Patient will name functional items with Mod A phonemic cues with 75% accuracy.  SLP Short Term Goal 4 - Progress (Week 3): Met SLP Short Term Goal 5 (Week 3): Patient will demonstrate reading comprehension at the phrase level with Mod A multimodal cues with 75% accruacy.  SLP Short Term Goal 5 - Progress (Week 3): Not met SLP Short Term Goal 6 (Week 3): Patient will verbalize wants/needs at the phrase level with Mod A multimodal cues.  SLP Short Term Goal 6 - Progress (Week 3): Met    New Short Term Goals: Week 4: SLP Short Term Goal 1 (Week 4): Patient will consume current diet without overt s/s of aspiration with supervision verbal cues for use of swallow strategies.  SLP Short Term Goal 2 (Week 4): Patient will demonstrate efficient mastication with regular textures demonstrated by minimal oral residue and no overt s/s of aspiration with superivsion verbal cues.  SLP Short Term Goal 3 (Week 4):  Patient will scan to the right field of enviornment with supervision verbal cues during functional and familiar tasks. SLP Short Term Goal 4 (Week 4): Patient will demonstrate selective attention to functional and familiar tasks for 30 minutes with supervision verbal cues for redirection.  SLP Short Term Goal 5 (Week 4): Patient will name functional items with Min A phonemic cues with 75% accuracy.  SLP Short Term Goal 6 (Week 4): Patient will verbalize wants/needs at the phrase level with Min A multimodal cues.   Weekly Progress Updates: Patient has made functional gains and has met 5 of 6 STG's this reporting period due to increased selective attention, attention to right field of environment, verbal expression, auditory comprehension and swallowing function. Currently, patient is consuming Dys. 3 textures with thin liquids without overt s/s of aspiration and requires Min A verbal cues for use of swallowing compensatory strategies. Patient demonstrates increased functional communication and is able to make her needs known and participate in a basic conversation at the phrase level with Mod A multimodal cues. Patient is also naming functional items with 100% accuracy and demonstrating reading comprehension at the word level with 90% accuracy with Max A multimodal cues. Patient also requires overall Min A for selective attention, attention to left field of environment, recall of information and functional problem solving.  Patient/family education ongoing. Patient would benefit from continued skilled SLP intervention to maximize her cognitive and swallowing function and functional communication prior to discharge.   Intensity: Minumum of  1-2 x/day, 30 to 90 minutes Frequency: 3 to 5 out of 7 days Duration/Length of Stay: 07/01/14 Treatment/Interventions: Cognitive remediation/compensation;Cueing hierarchy;Environmental controls;Dysphagia/aspiration precaution training;Functional tasks;Internal/external  aids;Patient/family education;Speech/Language facilitation;Therapeutic Activities   Daily Session  Skilled Therapeutic Interventions: Skilled treatment session focused on cognitive-linguistic goals. Upon arrival, patient was supine in bed using the bedpan. After completion of self-care tasks, patient was transferred to the wheelchair via the [facility] Patient participated convergent and divergent language tasks with Mod-Max A multimodal cues while utilizing picture tiles. Patient demonstrated increased difficulty with functional conversation today and overall spontaneous verbal expression, suspect due to fatigue. Patient left upright in wheelchair at RN station with quick release belt in place and all needs within reach. Continue with current plan of care.         FIM:  Comprehension Comprehension Mode: Auditory Comprehension: 4-Understands basic 75 - 89% of the time/requires cueing 10 - 24% of the time Expression Expression Mode: Verbal Expression: 3-Expresses basic 50 - 74% of the time/requires cueing 25 - 50% of the time. Needs to repeat parts of sentences. Social Interaction Social Interaction: 5-Interacts appropriately 90% of the time - Needs monitoring or encouragement for participation or interaction. Problem Solving Problem Solving: 2-Solves basic 25 - 49% of the time - needs direction more than half the time to initiate, plan or complete simple activities Memory Memory: 3-Recognizes or recalls 50 - 74% of the time/requires cueing 25 - 49% of the time   Pain No/Denies Pain   Therapy/Group: Individual Therapy  Mariusz Jubb 06/24/2014, 4:14 PM

## 2014-06-24 NOTE — Progress Notes (Signed)
Occupational Therapy Session Note  Patient Details  Name: Carolyn [name] MRN: [id number] Date of Birth: 29-Jan-1945  Today's Date: 06/24/2014 OT Individual Time: 0800-0930 OT Individual Time Calculation (min): 90 min    Short Term Goals: Week 4:  OT Short Term Goal 1 (Week 4): Focus on LTGs due expected discharge date  Skilled Therapeutic Interventions/Progress Updates:    Pt seen for ADL retraining with focus on functional transfers, postural control in sitting and standing, hemi dressing technique, attention to R, and cognitive remediation. Pt received supine in bed just receiving breakfast. Pt agreeable to sit EOB with min-SBA to finish breakfast. Pt required min cues for scanning to right to locate silverware and drink. Completed squat pivot transfer bed>rolling shower chair with max A +2 (second person to stabilize chair). Engaged in bathing with pt requiring min cues for sequencing and washing RUE. Completed sit<>stand from rolling shower chair with max A as second person swapped rolling shower chair out for w/c. Engaged in dressing with pt verbalizing 4/4 clothing items with increased time and mod cues. Pt stood with mod-max A and assisted with managing clothing over L hip. Pt initiated threading RUE into bra requiring assist to scan to locate hand then required total A for hemi dressing technique with shirt. Pt completed oral care with mod cues for turning cold water off (on right side). Engaged in "I Spy" game from w/c level while looking outside with focus on word finding, visual scanning, and verbal expression. Pt required max phonemic cues to identify 3 items by color. Pt also required mod cues for visual scanning to right during activity. At end of session pt left sitting in w/c at nurses station.   Therapy Documentation Precautions:  Precautions Precautions: Fall Precaution Comments: R field cut/R inattention, pushes to R  Restrictions Weight Bearing Restrictions: No General:    Vital Signs:  Pain: Pain Assessment Pain Assessment: No/denies pain ADL: ADL ADL Comments: Refer to FIM Exercises:   Other Treatments:    See FIM for current functional status  Therapy/Group: Individual Therapy  Dat Derksen, Vara GuardianKayla N 06/24/2014, 11:10 AM

## 2014-06-24 NOTE — Progress Notes (Signed)
69 y.o. right handed female with history of hypertension, recent status post AAA stent grafting at [facility] and was maintained on aspirin and Plavix.. 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 Admitted 05/20/2014 after after developing severe headache right arm weakness while at work and suddenly became unresponsive. Blood pressure 241/123. Cranial CT scan showed left ICH with 3 mm midline shift Subjective/Complaints:   No complaints this morning. Denies pain.  ROS: Cannot obtain secondary to mental status, aphasia  Objective: Vital Signs: Blood pressure 145/73, pulse 84, temperature 98.4 F (36.9 C), temperature source Oral, resp. rate 17, weight 80.922 kg (178 lb 6.4 oz), SpO2 95 %. No results found. No results found for this or any previous visit (from the past 72 hour(s)).   HEENT: left scalp wound with scab no evidence of drainage Cardio: RRR and no murmurs Resp: CTA B/L and unlabored GI: BS positive and nontender, nondistended Extremity:  Pulses positive and No Edema Skin:   Wound C/D/I and left scalp Neuro: Confused, Abnormal Sensory reduced sensation right upper extremity, Abnormal Motor 0/5 right upper extremity, 0/5 left lower extremity and Aphasic Musc/Skel:  Other no pain with right shoulder range of motion Gen. no acute distress   Assessment/Plan: 1. Functional deficits secondary to left intracranial hemorrhage status post evacuation 05/20/2014 with right hemiplegia and aphasia which require 3+ hours per day of interdisciplinary therapy in a comprehensive inpatient rehab setting. Physiatrist is providing close team supervision and 24 hour management of active medical problems listed below. Physiatrist and rehab team continue to assess barriers to discharge/monitor patient progress toward functional and medical goals.     FIM: FIM - Bathing Bathing Steps Patient Completed: Chest, Abdomen, Left Arm,  Right upper leg, Left upper leg Bathing: 1: Two helpers  FIM - Upper Body Dressing/Undressing Upper body dressing/undressing steps patient completed: Thread/unthread left bra strap, Thread/unthread left sleeve of pullover shirt/dress, Put head through opening of pull over shirt/dress, Thread/unthread right bra strap Upper body dressing/undressing: 3: Mod-Patient completed 50-74% of tasks FIM - Lower Body Dressing/Undressing Lower body dressing/undressing steps patient completed: Thread/unthread left pants leg Lower body dressing/undressing: 1: Total-Patient completed less than 25% of tasks  FIM - Hotel manager Devices: Grab bar or rail for support Toileting: 1: Two helpers  FIM - Diplomatic Services operational officer Devices: Psychiatrist Transfers: 2-To toilet/BSC: Max A (lift and lower assist), 2-From toilet/BSC: Max A (lift and lower assist)  FIM - Press photographer Assistive Devices: Arm rests, Bed rails Bed/Chair Transfer: 3: Supine > Sit: Mod A (lifting assist/Pt. 50-74%/lift 2 legs, 3: Sit > Supine: Mod A (lifting assist/Pt. 50-74%/lift 2 legs), 1: Two helpers  FIM - Locomotion: Wheelchair Distance: 5 ft Locomotion: Wheelchair: 1: Total Assistance/staff pushes wheelchair (Pt<25%) FIM - Locomotion: Ambulation Locomotion: Ambulation Assistive Devices: Walker - Hemi, Other (comment) (L rail in hallway) Ambulation/Gait Assistance: 1: +2 Total assist Locomotion: Ambulation: 1: Two helpers  Comprehension Comprehension Mode: Auditory Comprehension: 4-Understands basic 75 - 89% of the time/requires cueing 10 - 24% of the time  Expression Expression Mode: Verbal Expression: 3-Expresses basic 50 - 74% of the time/requires cueing 25 - 50% of the time. Needs to repeat parts of sentences.  Social Interaction Social Interaction: 5-Interacts appropriately 90% of the time - Needs monitoring or encouragement for participation or  interaction.  Problem Solving Problem Solving: 2-Solves basic 25 - 49% of the time - needs direction more than half the time to  initiate, plan or complete simple activities  Memory Memory: 3-Recognizes or recalls 50 - 74% of the time/requires cueing 25 - 49% of the time   Medical Problem List and Plan: 1. 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 DVT Prophylaxis/Anticoagulation: Subcutaneous heparin initiated 05/24/2014. Monitor for any bleeding episodes 3. Pain Management: Tylenol as needed 4. Seizure prophylaxis. Keppra 500 mg twice a day. EEG negative 5. Neuropsych: This patient is not capable of making decisions on her own behalf. 6. Skin/Wound Care: Routine skin checks 7. Fluids/Electrolytes/Nutrition: Strict I knows of follow-up chemistries 8. Dysphagia. Dysphagia #3 thin liquids. Encourage fluids 9. MRSA PCR screening positive. Contact precautions 10. Hypertension. Toprol-XL 25 mg daily. Monitor with increased mobility 11. Hypothyroidism. Synthroid LOS (Days) 21 A FACE TO FACE EVALUATION WAS PERFORMED  SWARTZ,ZACHARY T 06/24/2014, 8:49 AM

## 2014-06-24 NOTE — Progress Notes (Signed)
Social Work Patient ID: Carolyn [name], female   DOB: [birth year], 69 y.o.   MRN: [id number]   Able to connect with daughter yesterday via phone to review team conference.  Daughter understands that her mother continues to make slow, steady gains but still requiring significant assistance. Family likely to change d/c plan to SNF but have planned a family conference for next Tues 6/7 @ 3:30 PM to make final decision.  Will go ahead and "prep" the paperwork.  Continue to follow.  Mieka Leaton, LCSW

## 2014-06-25 ENCOUNTER — Inpatient Hospital Stay (HOSPITAL_COMMUNITY): Payer: BLUE CROSS/BLUE SHIELD | Admitting: Occupational Therapy

## 2014-06-25 ENCOUNTER — Inpatient Hospital Stay (HOSPITAL_COMMUNITY): Payer: BLUE CROSS/BLUE SHIELD | Admitting: Speech Pathology

## 2014-06-25 ENCOUNTER — Inpatient Hospital Stay (HOSPITAL_COMMUNITY): Payer: BLUE CROSS/BLUE SHIELD | Admitting: Physical Therapy

## 2014-06-25 DIAGNOSIS — R1314 Dysphagia, pharyngoesophageal phase: Secondary | ICD-10-CM

## 2014-06-25 MED ORDER — SENNOSIDES-DOCUSATE SODIUM 8.6-50 MG PO TABS: 2.0000 | ORAL_TABLET | Freq: Every evening | ORAL | Status: DC | PRN

## 2014-06-25 NOTE — Progress Notes (Signed)
Physical Therapy Session Note  Patient Details  Name: Carolyn [name] MRN: [id number] Date of Birth: 1945-12-02  Today's Date: 06/25/2014 PT Individual Time:  - 1300-1400 Treatment Time: 60 min     Short Term Goals: Week 4:  PT Short Term Goal 1 (Week 4): = LTGs due to anticipated LOS  Skilled Therapeutic Interventions/Progress Updates:    Therapeutic Activity: Pt received up in w/c, requesting to use the bathroom to have a BM. PT instructs pt in squat-pivot transfer req +2 assist to pt's R (due to R side inattention/neglect) and max A squat-pivot transfer to pt's L. Pt is able to have small, loose BM, req tot A for pants & hygiene mangement. Pt req +2 assist sit to supine with bedrail and mod A supine to sit in bed, and min A to roll R & L in bed with bedrail.   Neuromuscular Reeducation: PT instructs pt in R LE NMR faciliation techniques: bridging, hip adduction in hook lie, gravity minimized hip flexion in side lie, and LAQ AA/PROM: x 10 reps each.   Gait Training: PT instructs pt in ambulation with L wall rail x 20' req max A from PT and +2 for close w/c follow for safety, as well as single step sequencing cues, PT blocking pt's R knee in stance, and verbal cues to "wait" due to pt impulsiveness with ambulation.   Pt is slowly progressing with functional mobility. Continue per PT POC.    Therapy Documentation Precautions:  Precautions Precautions: Fall Precaution Comments: R field cut/R inattention, pushes to R  Restrictions Weight Bearing Restrictions: No Pain: Pain Assessment Pain Assessment: No/denies pain Pain Score: 0-No pain  See FIM for current functional status  Therapy/Group: Individual Therapy with Mikey CollegeKelvin, PT Tech, as +2  Erynn Vaca M 06/25/2014, 8:56 AM

## 2014-06-25 NOTE — Progress Notes (Signed)
Pt lung sounds remained clear throughout shift and vitals are stable. Attending MD notified of RR call.

## 2014-06-25 NOTE — Progress Notes (Signed)
Occupational Therapy Session Note  Patient Details  Name: Carolyn [name] MRN: [id number] Date of Birth: 1945/07/24  Today's Date: 06/25/2014 OT Individual Time:  -   0830-0930  (60 min)  1st session                                           1530-1600  (30 min)  2nd session      Short Term Goals: Week 1:  OT Short Term Goal 1 (Week 1): Pt will be able to sit to EOB with mod A. OT Short Term Goal 1 - Progress (Week 1): Partly met OT Short Term Goal 2 (Week 1): Pt will maintain static sitting EOB with mod A. OT Short Term Goal 2 - Progress (Week 1): Partly met OT Short Term Goal 3 (Week 1): Pt will demonstrate increased awareness of RUE to wash it with min A. OT Short Term Goal 3 - Progress (Week 1): Not met OT Short Term Goal 4 (Week 1): Pt will don shirt with max A. OT Short Term Goal 4 - Progress (Week 1): Met OT Short Term Goal 5 (Week 1): Pt will roll in bed with mod A to assist caregivers with changing her LB clothing. OT Short Term Goal 5 - Progress (Week 1): Partly met Week 2:  OT Short Term Goal 1 (Week 2): Pt will demonstrate static sitting EOB/EOM for 2 min with min A OT Short Term Goal 1 - Progress (Week 2): Met OT Short Term Goal 2 (Week 2): Pt will demonstrate increased awareness of RUE to wash/dress with mod multimodal cues OT Short Term Goal 2 - Progress (Week 2): Met OT Short Term Goal 3 (Week 2): Pt will consistently complete functional transfer with max A  OT Short Term Goal 3 - Progress (Week 2): Partly met OT Short Term Goal 4 (Week 2): Pt will complete LB dressing with max A OT Short Term Goal 4 - Progress (Week 2): Not met OT Short Term Goal 5 (Week 2): Pt stand for 45 seconds with or without mechanical lift (e.g [name]) to increase activity tolerance and midline orientation OT Short Term Goal 5 - Progress (Week 2): Met  Skilled Therapeutic Interventions/Progress Updates:      1st session:  Addressed the following:   R NMR, sitting/standing balance, functional  transfers, attending to R, midline orientation/decreased pushing,    Pt seen for ADL retraining with focus on functional transfers, postural control in sitting and standing, hemi dressing technique, attention to R, and cognitive remediation. Pt received supine in bed . Pt sat  EOB with min-SBA.  Pt verbalized she needed to use bathroom.  Obtained 3n1 commode chair (dropped arm).  Transferred from bed to 3n1 with total assist.  Pt had already moved bowels.  Pt needed minimal assist for sitting balance while on 3n1.  She was total assist transferring back to bed.  Transferred to shower chair and rolled into shower.  Assisted with bathing due to [facility] everywhere.  Pt. Needed physical cues to assist with remembering to hold RUE and to scan to right.  . At end of session pt left sitting in w/c at nurses station.   2nd session:  No pain Pt. Engaged in sit to stand at sink x 2 for 1 minute static standing balance.  Pt went from sit to stand with max assist and stood with max to mod  assist.  Pt. Tolerated well and left in room with safety belt on and all needs in reach.   Therapy Documentation Precautions:  Precautions Precautions: Fall Precaution Comments: R field cut/R inattention, pushes to R  Restrictions Weight Bearing Restrictions: No      Pain: Pain Assessment Pain Assessment: No/denies pain Pain Score: 0-No pain ADL: ADL ADL Comments: Refer to FIM    Other Treatments:    See FIM for current functional status  Therapy/Group: Individual Therapy  Lisa Roca 06/25/2014, 8:15 AM

## 2014-06-25 NOTE — Progress Notes (Signed)
69 y.o. right handed female with history of hypertension, recent status post AAA stent grafting at [facility] and was maintained on aspirin and Plavix.. 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 Admitted 05/20/2014 after after developing severe headache right arm weakness while at work and suddenly became unresponsive. Blood pressure 241/123. Cranial CT scan showed left ICH with 3 mm midline shift Subjective/Complaints:   Had difficulties last night with swallowing of thins---had coughing. Otherwise in no distress this am ROS: very limited secondary to mental status, aphasia  Objective: Vital Signs: Blood pressure 131/57, pulse 87, temperature 98.7 F (37.1 C), temperature source Oral, resp. rate 18, weight 80.922 kg (178 lb 6.4 oz), SpO2 97 %. No results found. No results found for this or any previous visit (from the past 72 hour(s)).   HEENT: left scalp wound with scab no evidence of drainage Cardio: RRR and no murmurs Resp: CTA B/L and unlabored GI: BS positive and nontender, nondistended Extremity:  Pulses positive and No Edema Skin:   Wound C/D/I and left scalp Neuro: Confused, Abnormal Sensory reduced sensation right upper extremity, Abnormal Motor 0/5 right upper extremity, 0/5 left lower extremity and Aphasic Musc/Skel:  Other no pain with right shoulder range of motion Gen. no acute distress   Assessment/Plan: 1. Functional deficits secondary to left intracranial hemorrhage status post evacuation 05/20/2014 with right hemiplegia and aphasia which require 3+ hours per day of interdisciplinary therapy in a comprehensive inpatient rehab setting. Physiatrist is providing close team supervision and 24 hour management of active medical problems listed below. Physiatrist and rehab team continue to assess barriers to discharge/monitor patient progress toward functional and medical goals.     FIM: FIM -  Bathing Bathing Steps Patient Completed: Chest, Abdomen, Left Arm, Right upper leg, Left upper leg Bathing: 3: Mod-Patient completes 5-7 [id number] 10 parts or 50-74% (rolling shower chair)  FIM - Upper Body Dressing/Undressing Upper body dressing/undressing steps patient completed: Thread/unthread left bra strap, Thread/unthread left sleeve of pullover shirt/dress, Put head through opening of pull over shirt/dress, Thread/unthread right bra strap Upper body dressing/undressing: 3: Mod-Patient completed 50-74% of tasks FIM - Lower Body Dressing/Undressing Lower body dressing/undressing steps patient completed: Thread/unthread left underwear leg, Thread/unthread left pants leg Lower body dressing/undressing: 2: Max-Patient completed 25-49% of tasks  FIM - Hotel managerToileting Toileting Assistive Devices: Grab bar or rail for support Toileting: 1: Two helpers  FIM - Diplomatic Services operational officerToilet Transfers Toilet Transfers Assistive Devices: PsychiatristBedside commode Toilet Transfers: 1-Two helpers, 2-To toilet/BSC: Max A (lift and lower assist)  FIM - Press photographerBed/Chair Transfer Bed/Chair Transfer Assistive Devices: Arm rests Bed/Chair Transfer: 2: Supine > Sit: Max A (lifting assist/Pt. 25-49%)  FIM - Locomotion: Wheelchair Distance: 5 ft Locomotion: Wheelchair: 2: Travels 50 - 149 ft with minimal assistance (Pt.>75%) FIM - Locomotion: Ambulation Locomotion: Ambulation Assistive Devices: Walker - Hemi, Other (comment) (L rail in hallway) Ambulation/Gait Assistance: 1: +2 Total assist Locomotion: Ambulation: 0: Activity did not occur  Comprehension Comprehension Mode: Auditory Comprehension: 4-Understands basic 75 - 89% of the time/requires cueing 10 - 24% of the time  Expression Expression Mode: Verbal Expression: 3-Expresses basic 50 - 74% of the time/requires cueing 25 - 50% of the time. Needs to repeat parts of sentences.  Social Interaction Social Interaction: 5-Interacts appropriately 90% of the time - Needs monitoring or  encouragement for participation or interaction.  Problem Solving Problem Solving: 2-Solves basic 25 - 49% of the time - needs direction more than half the time  to initiate, plan or complete simple activities  Memory Memory: 3-Recognizes or recalls 50 - 74% of the time/requires cueing 25 - 49% of the time   Medical Problem List and Plan: 1. 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 DVT Prophylaxis/Anticoagulation: Subcutaneous heparin initiated 05/24/2014. Monitor for any bleeding episodes 3. Pain Management: Tylenol generally effective 4. Seizure prophylaxis. Keppra 500 mg twice a day. EEG negative 5. Neuropsych: This patient is not capable of making decisions on her own behalf. 6. Skin/Wound Care: Routine skin checks 7. Fluids/Electrolytes/Nutrition: encourage PO. Check labs 8. Dysphagia. Dysphagia #3 thin liquids. Will ask SLP to follow up regarding coughing on thins 9. MRSA PCR screening positive. Contact precautions 10. Hypertension. Toprol-XL 25 mg daily. Monitor with increased mobility 11. Hypothyroidism. Synthroid  LOS (Days) 22 A FACE TO FACE EVALUATION WAS PERFORMED  SWARTZ,ZACHARY T 06/25/2014, 8:24 AM

## 2014-06-25 NOTE — Progress Notes (Signed)
Speech Language Pathology Daily Session Note  Patient Details  Name: Carolyn CourtGeraldine Ion MRN: [id number] Date of Birth: 09-11-1945  Today's Date: 06/25/2014 SLP Individual Time: 1100-1200 SLP Individual Time Calculation (min): 60 min  Short Term Goals: Week 4: SLP Short Term Goal 1 (Week 4): Patient will consume current diet without overt s/s of aspiration with supervision verbal cues for use of swallow strategies.  SLP Short Term Goal 2 (Week 4): Patient will demonstrate efficient mastication with regular textures demonstrated by minimal oral residue and no overt s/s of aspiration with superivsion verbal cues.  SLP Short Term Goal 3 (Week 4): Patient will scan to the right field of enviornment with supervision verbal cues during functional and familiar tasks. SLP Short Term Goal 4 (Week 4): Patient will demonstrate selective attention to functional and familiar tasks for 30 minutes with supervision verbal cues for redirection.  SLP Short Term Goal 5 (Week 4): Patient will name functional items with Min A phonemic cues with 75% accuracy.  SLP Short Term Goal 6 (Week 4): Patient will verbalize wants/needs at the phrase level with Min A multimodal cues.   Skilled Therapeutic Interventions: Skilled treatment session focused on dysphagia and communication goals. SLP facilitated session by providing skilled observation with lunch meal of Dys. 3 textures with thin liquids. Patient demonstrated efficient mastication demonstrated my trace oral residue/pocketing and did not demonstrate any overt s/s of aspiration throughout meal and required supervision verbal cues for small bites/sips. Recommend patient continue current diet. SLP also facilitated session by providing Mod A verbal and visual cues for oral reading at the phrase level and Min A verbal and phonemic cues for naming with sentence completion task. Patient's daughter present throughout session and educated on current SLP goals and progress. She  verbalized understanding of all information. Patient left upright in wheelchair with quick release belt in place and daughter present. Continue with current plan of care.    FIM:  Comprehension Comprehension Mode: Auditory Comprehension: 4-Understands basic 75 - 89% of the time/requires cueing 10 - 24% of the time Expression Expression Mode: Verbal Expression: 3-Expresses basic 50 - 74% of the time/requires cueing 25 - 50% of the time. Needs to repeat parts of sentences. Social Interaction Social Interaction: 5-Interacts appropriately 90% of the time - Needs monitoring or encouragement for participation or interaction. Problem Solving Problem Solving: 2-Solves basic 25 - 49% of the time - needs direction more than half the time to initiate, plan or complete simple activities Memory Memory: 3-Recognizes or recalls 50 - 74% of the time/requires cueing 25 - 49% of the time FIM - Eating Eating Activity: 5: Set-up assist for open containers;5: Set-up assist for cut food;5: Supervision/cues  Pain Pain Assessment Pain Assessment: No/denies pain  Therapy/Group: Individual Therapy  Alann Avey 06/25/2014, 4:23 PM

## 2014-06-26 ENCOUNTER — Inpatient Hospital Stay (HOSPITAL_COMMUNITY): Payer: BLUE CROSS/BLUE SHIELD | Admitting: *Deleted

## 2014-06-26 ENCOUNTER — Inpatient Hospital Stay (HOSPITAL_COMMUNITY): Payer: BLUE CROSS/BLUE SHIELD | Admitting: Speech Pathology

## 2014-06-26 DIAGNOSIS — I10 Essential (primary) hypertension: Secondary | ICD-10-CM

## 2014-06-26 NOTE — Progress Notes (Signed)
Carolyn PhilipsGeraldine [name] is a 69 y.o. female 05-23-1945 [id number]  Subjective: No new complaints. No new problems. Slept well. Feeling OK.  Objective: Vital signs in last 24 hours: Temp:  [98.1 F (36.7 C)-98.3 F (36.8 C)] 98.1 F (36.7 C) (06/04 0610) Pulse Rate:  [82-89] 85 (06/04 0610) Resp:  [16-18] 16 (06/04 0610) BP: (133-139)/(54-67) 134/57 mmHg (06/04 0610) SpO2:  [98 %] 98 % (06/04 0610) Weight change:  Last BM Date: 06/25/14  Intake/Output from previous day: 06/03 0701 - 06/04 0700 In: 1080 [P.O.:1080] Out: -   Physical Exam General: No apparent distress   Very pleasant  Lungs: Normal effort. Lungs clear to auscultation, no crackles or wheezes. Cardiovascular: Regular rate and rhythm, no edema Musculoskeletal:  grossly intact Neurological: No new neurological deficits Wounds:   Clean, dry, intact. No signs of infection.  Lab Results: BMET    Component Value Date/Time   NA 138 06/04/2014 0540   K 3.6 06/04/2014 0540   CL 102 06/04/2014 0540   CO2 28 06/04/2014 0540   GLUCOSE 96 06/04/2014 0540   BUN 12 06/04/2014 0540   CREATININE 0.62 06/04/2014 0540   CALCIUM 9.2 06/04/2014 0540   GFRNONAA >60 06/04/2014 0540   GFRAA >60 06/04/2014 0540   CBC    Component Value Date/Time   WBC 13.5* 06/04/2014 0540   RBC 4.25 06/04/2014 0540   HGB 11.4* 06/04/2014 0540   HCT 36.3 06/04/2014 0540   PLT 331 06/04/2014 0540   MCV 85.4 06/04/2014 0540   MCH 26.8 06/04/2014 0540   MCHC 31.4 06/04/2014 0540   RDW 15.3 06/04/2014 0540   LYMPHSABS 2.4 06/04/2014 0540   MONOABS 0.9 06/04/2014 0540   EOSABS 0.5 06/04/2014 0540   BASOSABS 0.0 06/04/2014 0540   CBG's (last 3):  No results for input(s): GLUCAP in the last 72 hours. LFT's Lab Results  Component Value Date   ALT 23 06/04/2014   AST 25 06/04/2014   ALKPHOS 99 06/04/2014   BILITOT 0.4 06/04/2014    Studies/Results: No results found.  Medications:  I have reviewed the patient's current  medications. Scheduled Medications: . acetaminophen  650 mg Per Tube TID  . antiseptic oral rinse  7 mL Mouth Rinse BID  . aspirin  81 mg Oral Daily  . feeding supplement (RESOURCE BREEZE)  1 Container Oral BID BM  . heparin subcutaneous  5,000 Units Subcutaneous 3 times per day  . levETIRAcetam  500 mg Oral BID  . levothyroxine  75 mcg Oral QAC breakfast  . metoprolol tartrate  12.5 mg Oral BID  . multivitamin with minerals  1 tablet Oral Daily  . pantoprazole  40 mg Oral Q1200   PRN Medications: ondansetron **OR** ondansetron (ZOFRAN) IV, senna-docusate, sodium chloride, sorbitol  Assessment/Plan: Principal Problem:   Nontraumatic hemorrhage of cerebral hemisphere Active Problems:   Aphasia   Right hemiplegia  1. 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 DVT Prophylaxis/Anticoagulation: Subcutaneous heparin initiated 05/24/2014. Monitor for any bleeding episodes 3. Pain Management: Tylenol generally effective 4. Seizure prophylaxis. Keppra 500 mg twice a day. EEG negative 5. Neuropsych: This patient is not capable of making decisions on her own behalf. 6. Skin/Wound Care: Routine skin checks 7. Fluids/Electrolytes/Nutrition: encourage PO. Check labs as needed 8. Dysphagia. Dysphagia #3 thin liquids. SLP to follow up regarding coughing on thins 9. MRSA PCR screening positive. Contact precautions 10. Hypertension. Toprol-XL 25 mg daily. Monitor with increased mobility 11. Hypothyroidism. Synthroid  Length of stay, days:  23   Rielle Schlauch A. Felicity Coyer, MD 06/26/2014, 11:22 AM

## 2014-06-26 NOTE — Progress Notes (Signed)
Physical Therapy Session Note  Patient Details  Name: [name] CourtGeraldine Venuto MRN: [id number] Date of Birth: 01/17/1946  Today's Date: 06/26/2014 PT Individual Time: 1330-1400 PT Individual Time Calculation (min): 30 min    Skilled Therapeutic Interventions/Progress Updates:  Patient in bed , resting at the beginning of session, agrees to therapy ,no c/o pain or discomfort. Transfer to sitting with max A and mod A to maintain sitting EOB .Transfer to w/c with squat pivot with max A,patient is able to initiate weight shift forward. W/c propulsion with hemi technique to /from room to gym with occ. cues to prevent L side inattention.  Multiple sit to stand training w/o use of UE and facilitation of weight shift and active hip flexion/extansion progression to achieve erect posture. Patient able to maintain standing with mod to max A of therapist and perform small weight shifting w/o LOB when L Knee is manually locked by therapist. Attempted to take few steps with close w/c follow, max A manual progression of l LE and locking for stance, max VC for sequencing. Patient returned to room ,left sitting in chair with QR belt and family present in room.  Therapy Documentation Precautions:  Precautions Precautions: Fall Precaution Comments: R field cut/R inattention, pushes to R  Restrictions Weight Bearing Restrictions: No General:   Vital Signs: Therapy Vitals Temp: 97.7 F (36.5 C) Temp Source: Oral Pulse Rate: 78 Resp: 15 BP: (!) 136/58 mmHg Patient Position (if appropriate): Sitting Oxygen Therapy SpO2: 95 % O2 Device: Not Delivered   See FIM for current functional status  Therapy/Group: Individual Therapy  Dorna MaiCzajkowska, Natayah Warmack W 06/26/2014, 3:24 PM

## 2014-06-26 NOTE — Progress Notes (Addendum)
Speech Language Pathology Daily Session Note  Patient Details  Name: [name] CourtGeraldine Yardley MRN: [id number] Date of Birth: 24-Mar-1945  Today's Date: 06/26/2014 SLP Individual Time: 1440-1500 SLP Individual Time Calculation (min): 20 min  Short Term Goals: Week 4: SLP Short Term Goal 1 (Week 4): Patient will consume current diet without overt s/s of aspiration with supervision verbal cues for use of swallow strategies.  SLP Short Term Goal 2 (Week 4): Patient will demonstrate efficient mastication with regular textures demonstrated by minimal oral residue and no overt s/s of aspiration with superivsion verbal cues.  SLP Short Term Goal 3 (Week 4): Patient will scan to the right field of enviornment with supervision verbal cues during functional and familiar tasks. SLP Short Term Goal 4 (Week 4): Patient will demonstrate selective attention to functional and familiar tasks for 30 minutes with supervision verbal cues for redirection.  SLP Short Term Goal 5 (Week 4): Patient will name functional items with Min A phonemic cues with 75% accuracy.  SLP Short Term Goal 6 (Week 4): Patient will verbalize wants/needs at the phrase level with Min A multimodal cues.   Skilled Therapeutic Interventions:  Pt was seen for skilled ST targeting cognitive-linguistic goals.  Upon arrival, pt was seated upright in wheelchair, awake, alert, and agreeable to participate in ST.  SLP facilitated the session with a structured oral reading task targeting expansion of verbal expression to the phrase/sentence level.  Pt read a short 3-4 sentence reading passage with limiting of visual stimuli to 2-3 words at a time and use of finger tracking to focus her visual attention during task.  Pt decoded words from reading passage with mod assist verbal cues and min visual cues for phonemic placement to produce words verbally. Pt was returned to room where she was able to communicate with SLP that her safety plan had been updated yesterday so  that she could stay in her room by herself.  Pt was left upright in the wheelchair, quick release belt donned, and all needs left within reach.    FIM:   Comprehension Comprehension Mode: Auditory Comprehension: 4-Understands basic 75 - 89% of the time/requires cueing 10 - 24% of the time Expression Expression Mode: Verbal Expression: 3-Expresses basic 50 - 74% of the time/requires cueing 25 - 50% of the time. Needs to repeat parts of sentences. Social Interaction Social Interaction: 5-Interacts appropriately 90% of the time - Needs monitoring or encouragement for participation or interaction. Problem Solving Problem Solving: 2-Solves basic 25 - 49% of the time - needs direction more than half the time to initiate, plan or complete simple activities Memory Memory: 3-Recognizes or recalls 50 - 74% of the time/requires cueing 25 - 49% of the time  Pain Pain Assessment Pain Assessment: No/denies pain  Therapy/Group: Individual Therapy  Jinan Biggins, [name] 06/26/2014, 4:11 PM

## 2014-06-27 ENCOUNTER — Inpatient Hospital Stay (HOSPITAL_COMMUNITY): Payer: BLUE CROSS/BLUE SHIELD | Admitting: Physical Therapy

## 2014-06-27 NOTE — Progress Notes (Signed)
Carolyn [name] is a 69 y.o. female 13-Aug-1945 [id number]  Subjective: No new complaints. No new problems. Slept well. Feeling OK.  Objective: Vital signs in last 24 hours: Temp:  [97.7 F (36.5 C)-98.2 F (36.8 C)] 98.2 F (36.8 C) (06/05 0603) Pulse Rate:  [78-88] 83 (06/05 0603) Resp:  [15-17] 17 (06/05 0603) BP: (110-136)/(58-61) 134/61 mmHg (06/05 0603) SpO2:  [95 %-97 %] 97 % (06/05 0603) Weight change:  Last BM Date: 06/25/14  Intake/Output from previous day: 06/04 0701 - 06/05 0700 In: 320 [P.O.:320] Out: -   Physical Exam General: No apparent distress   Very pleasant  Lungs: Normal effort. Lungs clear to auscultation, no crackles or wheezes. Cardiovascular: Regular rate and rhythm, no edema Musculoskeletal:  grossly intact Neurological: No new neurological deficits Wounds:   Clean, dry, intact. No signs of infection.  Lab Results: BMET    Component Value Date/Time   NA 138 06/04/2014 0540   K 3.6 06/04/2014 0540   CL 102 06/04/2014 0540   CO2 28 06/04/2014 0540   GLUCOSE 96 06/04/2014 0540   BUN 12 06/04/2014 0540   CREATININE 0.62 06/04/2014 0540   CALCIUM 9.2 06/04/2014 0540   GFRNONAA >60 06/04/2014 0540   GFRAA >60 06/04/2014 0540   CBC    Component Value Date/Time   WBC 13.5* 06/04/2014 0540   RBC 4.25 06/04/2014 0540   HGB 11.4* 06/04/2014 0540   HCT 36.3 06/04/2014 0540   PLT 331 06/04/2014 0540   MCV 85.4 06/04/2014 0540   MCH 26.8 06/04/2014 0540   MCHC 31.4 06/04/2014 0540   RDW 15.3 06/04/2014 0540   LYMPHSABS 2.4 06/04/2014 0540   MONOABS 0.9 06/04/2014 0540   EOSABS 0.5 06/04/2014 0540   BASOSABS 0.0 06/04/2014 0540   CBG's (last 3):  No results for input(s): GLUCAP in the last 72 hours. LFT's Lab Results  Component Value Date   ALT 23 06/04/2014   AST 25 06/04/2014   ALKPHOS 99 06/04/2014   BILITOT 0.4 06/04/2014    Studies/Results: No results found.  Medications:  I have reviewed the patient's current  medications. Scheduled Medications: . acetaminophen  650 mg Per Tube TID  . antiseptic oral rinse  7 mL Mouth Rinse BID  . aspirin  81 mg Oral Daily  . feeding supplement (RESOURCE BREEZE)  1 Container Oral BID BM  . heparin subcutaneous  5,000 Units Subcutaneous 3 times per day  . levETIRAcetam  500 mg Oral BID  . levothyroxine  75 mcg Oral QAC breakfast  . metoprolol tartrate  12.5 mg Oral BID  . multivitamin with minerals  1 tablet Oral Daily  . pantoprazole  40 mg Oral Q1200   PRN Medications: ondansetron **OR** ondansetron (ZOFRAN) IV, senna-docusate, sodium chloride, sorbitol  Assessment/Plan: Principal Problem:   Nontraumatic hemorrhage of cerebral hemisphere Active Problems:   Aphasia   Right hemiplegia  1. 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 DVT Prophylaxis/Anticoagulation: Subcutaneous heparin initiated 05/24/2014. Monitor for any bleeding episodes 3. Pain Management: Tylenol generally effective 4. Seizure prophylaxis. Keppra 500 mg twice a day. EEG negative 5. Neuropsych: This patient is not capable of making decisions on her own behalf. 6. Skin/Wound Care: Routine skin checks 7. Fluids/Electrolytes/Nutrition: encourage PO. Check labs as needed 8. Dysphagia. Dysphagia #3 thin liquids. SLP to follow up regarding coughing on thins 9. MRSA PCR screening positive. Contact precautions 10. Hypertension. Toprol-XL 25 mg daily. Monitor with increased mobility 11. Hypothyroidism. Synthroid  Length of stay,  days: 24   Kagan Mutchler A. Felicity CoyerLeschber, MD 06/27/2014, 8:35 AM

## 2014-06-27 NOTE — Progress Notes (Signed)
Physical Therapy Session Note  Patient Details  Name: Carolyn [name] MRN: [id number] Date of Birth: 1945-12-14  Today's Date: 06/27/2014 PT Individual Time: 1037-1121 PT Individual Time Calculation (min): 44 min   Short Term Goals: Week 4:  PT Short Term Goal 1 (Week 4): = LTGs due to anticipated LOS  Skilled Therapeutic Interventions/Progress Updates:   Pt received in bed; very alert and ready to work with therapy.  Pt performed supine > sit rolling to L side with mod A but total verbal and visual cues for pt to slow down and attend to therapist's cues for more efficient sequence.  Performed scooting and squat pivot bed > w/c with max A with mod-max verbal cues for safety and sequence.  Performed NMR in hall way; see below for details. Pt left in room with family present and all items within reach.  Will continue to address tomorrow.    Therapy Documentation Precautions:  Precautions Precautions: Fall Precaution Comments: R field cut/R inattention, pushes to R  Restrictions Weight Bearing Restrictions: No Pain: Pain Assessment Pain Assessment: No/denies pain Pain Score: 0-No pain Faces Pain Scale: No hurt PAINAD (Pain Assessment in Advanced Dementia) Breathing: normal Locomotion : Ambulation Ambulation/Gait Assistance: 1: +2 Total assist  Other Treatments: Treatments Neuromuscular Facilitation: Right;Left;Lower Extremity;Activity to increase coordination;Activity to increase motor control;Activity to increase timing and sequencing;Activity to increase grading;Activity to increase sustained activation;Activity to increase lateral weight shifting;Activity to increase anterior-posterior weight shifting during gait with wall rail on L side x 25' with total A of one person (second person follow with w/c) with therapist providing max-total verbal and tactile cues + facilitation for postural control, R lateral weight shifting, controlled anterior weight shift over LLE and advancement and  placement of RLE (improved knee control noted); as pt fatigued she demonstrated increased pushing to R, L trunk shortening and decreased ability to unload RLE for advancement.  Continued NMR in standing with focus on ant/posterior diagonal weight shifts with UE support on wall rail and sequencing for stepping LLE forwards and backwards with total A; continue to have poor trunk excursion and weight shifting at appropriate time; difficulty sequencing stepping sequence.  Returned to sitting and began to focus on active trunk shortening on R, elongating on L during active weight shifting to L and maintaining COG in midline; required max tactile cues at head and pelvis; increased difficulty sequencing with verbal cues.    See FIM for current functional status  Therapy/Group: Individual Therapy  Edman CircleHall, [name] 06/27/2014, 12:43 PM

## 2014-06-28 ENCOUNTER — Inpatient Hospital Stay (HOSPITAL_COMMUNITY): Payer: BLUE CROSS/BLUE SHIELD

## 2014-06-28 ENCOUNTER — Inpatient Hospital Stay (HOSPITAL_COMMUNITY): Payer: BLUE CROSS/BLUE SHIELD | Admitting: Speech Pathology

## 2014-06-28 ENCOUNTER — Inpatient Hospital Stay (HOSPITAL_COMMUNITY): Payer: BLUE CROSS/BLUE SHIELD | Admitting: Occupational Therapy

## 2014-06-28 ENCOUNTER — Inpatient Hospital Stay (HOSPITAL_COMMUNITY): Payer: BLUE CROSS/BLUE SHIELD | Admitting: Physical Therapy

## 2014-06-28 NOTE — Progress Notes (Signed)
Occupational Therapy Session Note  Patient Details  Name: Carolyn [name] MRN: [id number] Date of Birth: 12-18-1945  Today's Date: 06/28/2014 OT Individual Time: 1030-1100 OT Individual Time Calculation (min): 30 min   Short Term Goals: Week 1:  OT Short Term Goal 1 (Week 1): Pt will be able to sit to EOB with mod A. OT Short Term Goal 1 - Progress (Week 1): Partly met OT Short Term Goal 2 (Week 1): Pt will maintain static sitting EOB with mod A. OT Short Term Goal 2 - Progress (Week 1): Partly met OT Short Term Goal 3 (Week 1): Pt will demonstrate increased awareness of RUE to wash it with min A. OT Short Term Goal 3 - Progress (Week 1): Not met OT Short Term Goal 4 (Week 1): Pt will don shirt with max A. OT Short Term Goal 4 - Progress (Week 1): Met OT Short Term Goal 5 (Week 1): Pt will roll in bed with mod A to assist caregivers with changing her LB clothing. OT Short Term Goal 5 - Progress (Week 1): Partly met   Week 2:  OT Short Term Goal 1 (Week 2): Pt will demonstrate static sitting EOB/EOM for 2 min with min A OT Short Term Goal 1 - Progress (Week 2): Met OT Short Term Goal 2 (Week 2): Pt will demonstrate increased awareness of RUE to wash/dress with mod multimodal cues OT Short Term Goal 2 - Progress (Week 2): Met OT Short Term Goal 3 (Week 2): Pt will consistently complete functional transfer with max A  OT Short Term Goal 3 - Progress (Week 2): Partly met OT Short Term Goal 4 (Week 2): Pt will complete LB dressing with max A OT Short Term Goal 4 - Progress (Week 2): Not met OT Short Term Goal 5 (Week 2): Pt stand for 45 seconds with or without mechanical lift (e.g [name]) to increase activity tolerance and midline orientation OT Short Term Goal 5 - Progress (Week 2): Met   Week 3:  OT Short Term Goal 1 (Week 3): Pt will complete LB dressing with max A+1 OT Short Term Goal 1 - Progress (Week 3): Progressing toward goal OT Short Term Goal 2 (Week 3): Pt will consistently  complete toilet transfer with max A and second person present for stabilizing w/c OT Short Term Goal 2 - Progress (Week 3): Met OT Short Term Goal 3 (Week 3): Pt will verbalize type of clothing during dressing task with 75% accuracy OT Short Term Goal 3 - Progress (Week 3): Met OT Short Term Goal 4 (Week 3): Pt will scan to right to locate 50% of self-care items with mod cues OT Short Term Goal 4 - Progress (Week 3): Met OT Short Term Goal 5 (Week 3): Pt will complete functional activity in standing for 75 seconds with max A for standing balance OT Short Term Goal 5 - Progress (Week 3): Met   Week 4:  OT Short Term Goal 1 (Week 4): Focus on LTGs due expected discharge date  Skilled Therapeutic Interventions/Progress Updates:  Patient received seated in w/c with quick release belt donned. Assisted pt > BI gym for therapeutic activity focusing on problem solving, attention, communication, memory, right attention. Pt played game (X3) of connect four with problem solving increasing each game. At end of session, assisted pt back to room and left her seated in w/c with quick release belt and all needs within reach.   Precautions:  Precautions Precautions: Fall Precaution Comments: R field cut/R inattention,  pushes to R  Restrictions Weight Bearing Restrictions: No  See FIM for current functional status  Therapy/Group: Individual Therapy  Janiel Derhammer , MS, OTR/L, CLT Pager: [phone]  06/28/2014, 11:07 AM

## 2014-06-28 NOTE — Progress Notes (Signed)
Speech Language Pathology Daily Session Notes  Patient Details  Name: Carolyn [name] MRN: [id number] Date of Birth: Nov 12, 1945  Today's Date: 06/28/2014  Session 1: SLP Individual Time: [id number] SLP Individual Time Calculation (min): 30 min   Session 2: SLP Individual Time: 1130-1200 SLP Individual Time Calculation (min): 30 min  Short Term Goals: Week 4: SLP Short Term Goal 1 (Week 4): Patient will consume current diet without overt s/s of aspiration with supervision verbal cues for use of swallow strategies.  SLP Short Term Goal 2 (Week 4): Patient will demonstrate efficient mastication with regular textures demonstrated by minimal oral residue and no overt s/s of aspiration with superivsion verbal cues.  SLP Short Term Goal 3 (Week 4): Patient will scan to the right field of enviornment with supervision verbal cues during functional and familiar tasks. SLP Short Term Goal 4 (Week 4): Patient will demonstrate selective attention to functional and familiar tasks for 30 minutes with supervision verbal cues for redirection.  SLP Short Term Goal 5 (Week 4): Patient will name functional items with Min A phonemic cues with 75% accuracy.  SLP Short Term Goal 6 (Week 4): Patient will verbalize wants/needs at the phrase level with Min A multimodal cues.   Skilled Therapeutic Interventions:  Session 1: Skilled treatment session focused on speech and language goals. SLP facilitated session by providing Mod A phonemic cues for patient to complete a decoding and word-finding task at the phrase level when given visual cues.  Patient continues to require Mod A multimodal cues for self-monitoring and correcting with verbal expression tasks. Patient left upright in wheelchair with quick release belt in place and all needs within reach. Continue with current plan of care.    Session 2: Skilled treatment session focused on dysphagia goals. SLP facilitated session by providing set-up assist and supervision  verbal cues for use of a small sips with lunch meal of Dys. 3 textures with thin liquids. Patient consumed meal without overt s/s of aspiration but demonstrated limited PO intake. Patient consumed trials of regular crackers and demonstrated efficient mastication with minimal oral residue/pocketing noted. Recommend patient continue current diet. Patient left upright in wheelchair with quick release belt in place and all needs within reach. Continue with current plan of care.   FIM:  Comprehension Comprehension Mode: Auditory Comprehension: 4-Understands basic 75 - 89% of the time/requires cueing 10 - 24% of the time Expression Expression Mode: Verbal Expression: 3-Expresses basic 50 - 74% of the time/requires cueing 25 - 50% of the time. Needs to repeat parts of sentences. Social Interaction Social Interaction: 5-Interacts appropriately 90% of the time - Needs monitoring or encouragement for participation or interaction. Problem Solving Problem Solving: 2-Solves basic 25 - 49% of the time - needs direction more than half the time to initiate, plan or complete simple activities Memory Memory: 3-Recognizes or recalls 50 - 74% of the time/requires cueing 25 - 49% of the time FIM - Eating Eating Activity: 5: Set-up assist for open containers;5: Set-up assist for cut food;5: Supervision/cues  Pain Pain Assessment Pain Assessment: No/denies pain  Therapy/Group: Individual Therapy  Juleon Narang 06/28/2014, 4:53 PM

## 2014-06-28 NOTE — Progress Notes (Signed)
Occupational Therapy Session Note  Patient Details  Name: [name] CourtGeraldine Avery MRN: [id number] Date of Birth: 16-Apr-1945  Today's Date: 06/28/2014 OT Individual Time: 0800-0900 OT Individual Time Calculation (min): 60 min    Short Term Goals: Week 4:  OT Short Term Goal 1 (Week 4): Focus on LTGs due expected discharge date  Skilled Therapeutic Interventions/Progress Updates:    Pt engaged in BADL retraining including bathing and dressing with sit<>stand from w/c at sink.  Pt requested use of BSC and transferred to drop arm BSC with tot A + 2 (for safety; pt=50%).  Pt required max verbal cues for safety secondary to continued impulsive actions.  Pt required max multimodal cues to initiate hemi bathing and dressing techniques.  Pt required max A for sit<>stand and standing balance to facilitate pants being pulled up.  Focus on activity tolerance, sit<>stand, functional transfers, standing balance, hemi dressing techniques, and safety awareness.  Therapy Documentation Precautions:  Precautions Precautions: Fall Precaution Comments: R field cut/R inattention, pushes to R  Restrictions Weight Bearing Restrictions: No Pain: Pain Assessment Pain Assessment: No/denies pain ADL: ADL ADL Comments: Refer to FIM  See FIM for current functional status  Therapy/Group: Individual Therapy  Rich BraveLanier, Kaydyn Chism Chappell 06/28/2014, 9:01 AM

## 2014-06-28 NOTE — Progress Notes (Signed)
69 y.o. right handed female with history of hypertension, recent status post AAA stent grafting at [facility] and was maintained on aspirin and Plavix.. By report patient lived alone prior to admission working as an Web designer. Admitted 05/20/2014 after after developing severe headache right arm weakness while at work and suddenly became unresponsive. Blood pressure 241/123. Cranial CT scan showed left ICH with 3 mm midline shift Subjective/Complaints:  sitting at nursing station without distress, in a wheelchair with lap tray on. ROS: very limited secondary to mental status, aphasia  Objective: Vital Signs: Blood pressure 128/62, pulse 77, temperature 97.9 F (36.6 C), temperature source Oral, resp. rate 17, weight 80.922 kg (178 lb 6.4 oz), SpO2 99 %. No results found. No results found for this or any previous visit (from the past 72 hour(s)).   HEENT: left scalp wound with scab no evidence of drainage Cardio: RRR and no murmurs Resp: CTA B/L and unlabored GI: BS positive and nontender, nondistended Extremity:  Pulses positive and No Edema Skin:   Wound C/D/I and left scalp Neuro: Confused, Abnormal Sensory reduced sensation right upper extremity, left-sided strength is 4/5 in the upper and lower limbAbnormal Motor 0/5 right upper extremity, 0/5 left lower extremity and Aphasic Musc/Skel:  Other no pain with upper limb or lower limb range of motion Gen. no acute distress   Assessment/Plan: 1. Functional deficits secondary to left intracranial hemorrhage status post evacuation 05/20/2014 with right hemiplegia and aphasia which require 3+ hours per day of interdisciplinary therapy in a comprehensive inpatient rehab setting. Physiatrist is providing close team supervision and 24 hour management of active medical problems listed below. Physiatrist and rehab team continue to assess barriers to discharge/monitor patient progress toward functional and medical  goals.     FIM: FIM - Bathing Bathing Steps Patient Completed: Chest, Right Arm, Abdomen, Right upper leg, Left upper leg Bathing: 3: Mod-Patient completes 5-7 46f10 parts or 50-74%  FIM - Upper Body Dressing/Undressing Upper body dressing/undressing steps patient completed: Thread/unthread left bra strap, Thread/unthread left sleeve of pullover shirt/dress, Put head through opening of pull over shirt/dress, Thread/unthread right bra strap Upper body dressing/undressing: 3: Mod-Patient completed 50-74% of tasks FIM - Lower Body Dressing/Undressing Lower body dressing/undressing steps patient completed: Thread/unthread left underwear leg, Thread/unthread left pants leg Lower body dressing/undressing: 2: Max-Patient completed 25-49% of tasks  FIM - TMusicianDevices: Grab bar or rail for support Toileting: 1: Two helpers  FIM - TRadio producerDevices: BRecruitment consultantTransfers: 1-Two helpers, 1-To toilet/BSC: Total A (helper does all/Pt. < 25%), 1-From toilet/BSC: Total A (helper does all/Pt. < 25%)  FIM - Bed/Chair Transfer Bed/Chair Transfer Assistive Devices: Arm rests, Bed rails Bed/Chair Transfer: 3: Supine > Sit: Mod A (lifting assist/Pt. 50-74%/lift 2 legs, 2: Bed > Chair or W/C: Max A (lift and lower assist), 2: Chair or W/C > Bed: Max A (lift and lower assist)  FIM - Locomotion: Wheelchair Distance: 5 ft Locomotion: Wheelchair: 1: Total Assistance/staff pushes wheelchair (Pt<25%) FIM - Locomotion: Ambulation Locomotion: Ambulation Assistive Devices: Other (comment) (wall rail) Ambulation/Gait Assistance: 1: +2 Total assist Locomotion: Ambulation: 1: Two helpers  Comprehension Comprehension Mode: Auditory Comprehension: 4-Understands basic 75 - 89% of the time/requires cueing 10 - 24% of the time  Expression Expression Mode: Verbal Expression: 3-Expresses basic 50 - 74% of the time/requires cueing 25 - 50% of the  time. Needs to repeat parts of sentences.  Social Interaction Social Interaction: 5-Interacts appropriately 90%  of the time - Needs monitoring or encouragement for participation or interaction.  Problem Solving Problem Solving: 2-Solves basic 25 - 49% of the time - needs direction more than half the time to initiate, plan or complete simple activities  Memory Memory: 3-Recognizes or recalls 50 - 74% of the time/requires cueing 25 - 49% of the time   Medical Problem List and Plan: 1. 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 DVT Prophylaxis/Anticoagulation: Subcutaneous heparin initiated 05/24/2014. Monitor for any bleeding episodes 3. Pain Management: Tylenol generally effective 4. Seizure prophylaxis. Keppra 500 mg twice a day. EEG negative 5. Neuropsych: This patient is not capable of making decisions on her own behalf. 6. Skin/Wound Care: Routine skin checks 7. Fluids/Electrolytes/Nutrition: encourage PO. Monitor be met 8. Dysphagia. Dysphagia #3 thin liquids. Will ask SLP to follow up regarding coughing on thins 9. MRSA PCR screening positive. Contact precautions 10. Hypertension. Toprol-XL 25 mg daily. Monitor with increased mobility 11. Hypothyroidism. Synthroid  LOS (Days) 25 A FACE TO FACE EVALUATION WAS PERFORMED  Araya Roel E 06/28/2014, 10:41 AM

## 2014-06-28 NOTE — Progress Notes (Signed)
Physical Therapy Session Note  Patient Details  Name: Carolyn [name] MRN: [id number] Date of Birth: 1945/06/01  Today's Date: 06/28/2014 PT Individual Time: 1400-1500 PT Individual Time Calculation (min): 60 min   Short Term Goals: Week 4:  PT Short Term Goal 1 (Week 4): = LTGs due to anticipated LOS  Skilled Therapeutic Interventions/Progress Updates:   Pt received in bed; requesting to use BSC.  Pt performed rolling to L side and side > sit with mod A but max verbal cues for sequencing and attention to RUE and LE placement and positioning during supine > sit.  Performed squat pivots bed <> BSC with max A for lateral and anterior weight shifting and to maintain squat during full pivot.  Pt performed multiple sit <> stands [facility] with mod A and max A to maintain standing with UE support on bed rail for clothing management and hygiene; pt attempted hygiene in sitting and standing but unable to maintain balance.  Performed transfers bed > w/c <> mat to L and R squat pivot with max A; still with difficulty maintaining squat during full pivot and difficulty with weight shifting to L.  While being transported to the gym in the w/c engaged pt in visual scanning and head turning to identify and name objects in environment on R side.  In gym performed NMR on mat; see below.  Returned pt to room and pt set up in w/c with quick release belt in place and all items within reach.     Therapy Documentation Precautions:  Precautions Precautions: Fall Precaution Comments: R field cut/R inattention, pushes to R  Restrictions Weight Bearing Restrictions: No Pain:  No c/o pain Other Treatments: Treatments Neuromuscular Facilitation: Right;Left;Lower Extremity;Forced use;Activity to increase motor control;Activity to increase timing and sequencing;Activity to increase grading;Activity to increase sustained activation;Activity to increase lateral weight shifting;Activity to increase anterior-posterior weight  shifting seated on mat with R hip wedged to facilitate increased L trunk elongation and weight shifting to L; focus on active weight shift forwards and L in sitting while reaching for and naming colored ring.  Transitioned to reaching for rings and performing functional diagonal weight shift for sit <> stand to minimize R lateropulsion and increase activation of RLE extension.  Pt able to perform sit > stand with min A.  See FIM for current functional status  Therapy/Group: Individual Therapy  Edman CircleHall, Arvetta Araque Faucette 06/28/2014, 3:20 PM

## 2014-06-29 ENCOUNTER — Inpatient Hospital Stay (HOSPITAL_COMMUNITY): Payer: BLUE CROSS/BLUE SHIELD | Admitting: Speech Pathology

## 2014-06-29 ENCOUNTER — Inpatient Hospital Stay (HOSPITAL_COMMUNITY): Payer: BLUE CROSS/BLUE SHIELD | Admitting: Physical Therapy

## 2014-06-29 ENCOUNTER — Inpatient Hospital Stay (HOSPITAL_COMMUNITY): Payer: BLUE CROSS/BLUE SHIELD | Admitting: Occupational Therapy

## 2014-06-29 ENCOUNTER — Inpatient Hospital Stay (HOSPITAL_COMMUNITY): Payer: BLUE CROSS/BLUE SHIELD

## 2014-06-29 MED ORDER — CITALOPRAM HYDROBROMIDE 10 MG PO TABS
10.0000 mg | ORAL_TABLET | Freq: Every day | ORAL | Status: DC
  Administered 2014-06-30 – 2014-07-14 (×15): 10 mg via ORAL
  Filled 2014-06-29 (×17): qty 1

## 2014-06-29 MED ORDER — BOOST / RESOURCE BREEZE PO LIQD
1.0000 | Freq: Three times a day (TID) | ORAL | Status: DC
  Administered 2014-07-03: 1 via ORAL

## 2014-06-29 NOTE — Progress Notes (Signed)
Occupational Therapy Weekly Progress Note  Patient Details  Name: Carolyn [name] MRN: [id number] Date of Birth: October 12, 1945  Beginning of progress report period: June 23, 2014 End of progress report period: June 29, 2014  Today's Date: 06/29/2014 OT Individual Time: 0900-1000 OT Individual Time Calculation (min): 60 min    Patient has met 3 of 9 long term goals. Patient continues to demonstrate slow, steady progress. Patient currently requires max A +1 for functional transfers bed<>w/c and w/c<>mat table. Patient requires max A+2 for toilet transfers with second person present for safety due to impulsivity with toileting task. Patient demonstrates improved carryover of hemi dressing techniques and attention to RUE, requiring min cues. Patient demonstrates tone in R shoulder and biceps and continues to report no pain with PROM exercises. Patient continues to demonstrate improved activity tolerance and verbal communication, progressing to remaining in room between therapy sessions with call bell in hand. Therapy team completed family conference with patient's family to discuss current level of function, goals for therapy, and focus of therapy at this time. Patient's family with no questions at this time.   Patient continues to demonstrate the following deficits: R hemiplegia, decreased midline orientation, decreased postural control, decreased sitting and standing balance, decreased core strength, R inattention, decreased awareness, cognitive-linguistic deficits, decreased safety awareness, decreased activity tolerance, decreased strength, aphasia, R field cut, decreased coordination, pushing tendencies to R, poor spatial awareness, decreased sensation, motor apraxia, decreased initiation and therefore will continue to benefit from skilled OT intervention to enhance overall performance with BADL, Reduce care partner burden and improve midline orientation, postural control, balance, attention to R, and  strength.  Patient progressing toward long term goals..  Continue plan of care.  OT Short Term Goals Week 4:  OT Short Term Goal 1 (Week 4): Focus on LTGs due expected discharge date Week 5:  OT Short Term Goal 1 (Week 5): Focus on LTGs due expected discharge date  Skilled Therapeutic Interventions/Progress Updates:    Pt seen for ADL retraining with focus on functional transfers, standing balance, hemi dressing technique, cognitive remediation, and visual scanning to right. Pt received sitting in w/c following SLP session. Pt completed bathing at shower level from rolling shower chair. Pt completed sit<>stand at sink with min-mod A as another therapist transferred w/c<>rolling shower chair. Pt required min multimodal cues for attention to RUE during bathing and pt demonstrating improved postural control during anterior weight shift to wash lower BLEs. Pt completed dressing sit<>stand from w/c demonstrating recall of hemi dressing technique for LB dressing, however required assist to recall for UB dressing. Pt required max A for dynamic standing balance during LB dressing as she assisted with clothing management over L hip. Practiced squat pivot transfers w/c<>mat table 2x with pt requiring max A transferring to left and mod A transferring to right. At end of session pt left sitting in w/c with all needs in reach. Of note pt with increased tone in RUE at shoulder and bicep.   Therapy Documentation Precautions:  Precautions Precautions: Fall Precaution Comments: R field cut/R inattention, pushes to R  Restrictions Weight Bearing Restrictions: No General:   Vital Signs:  Pain: Pain Assessment Pain Assessment: No/denies pain  See FIM for current functional status  Therapy/Group: Individual Therapy  Duayne Cal 06/29/2014, 12:14 PM

## 2014-06-29 NOTE — Progress Notes (Signed)
69 y.o. right handed female with history of hypertension, recent status post AAA stent grafting at [facility] and was maintained on aspirin and Plavix.. By report patient lived alone prior to admission working as an Web designer. Admitted 05/20/2014 after after developing severe headache right arm weakness while at work and suddenly became unresponsive. Blood pressure 241/123. Cranial CT scan showed left ICH with 3 mm midline shift Subjective/Complaints: Patient remembers having occupational therapy. She could not tell me what she did in occupational therapy however She voices no complaints but does have limited expressive language ROS: very limited secondary to mental status, aphasia  Objective: Vital Signs: Blood pressure 144/52, pulse 80, temperature 98.1 F (36.7 C), temperature source Oral, resp. rate 18, weight 80.922 kg (178 lb 6.4 oz), SpO2 95 %. No results found. No results found for this or any previous visit (from the past 72 hour(s)).   HEENT: left scalp wound with scab no evidence of drainage Cardio: RRR and no murmurs Resp: CTA B/L and unlabored GI: BS positive and nontender, nondistended Extremity:  Pulses positive and No Edema Skin:   Wound C/D/I and left scalp Neuro: Confused, Abnormal Sensory reduced sensation right upper extremity, left-sided strength is 4/5 in the upper and lower limbAbnormal Motor 0/5 right upper extremity, 0/5 left lower extremity and Aphasic Musc/Skel:  Other no pain with upper limb or lower limb range of motion Gen. no acute distress   Assessment/Plan: 1. Functional deficits secondary to left intracranial hemorrhage status post evacuation 05/20/2014 with right hemiplegia and aphasia which require 3+ hours per day of interdisciplinary therapy in a comprehensive inpatient rehab setting. Physiatrist is providing close team supervision and 24 hour management of active medical problems listed below. Physiatrist and rehab  team continue to assess barriers to discharge/monitor patient progress toward functional and medical goals.  Team conference today please see physician documentation under team conference tab, met with team face-to-face to discuss problems,progress, and goals. Formulized individual treatment plan based on medical history, underlying problem and comorbidities.   FIM: FIM - Bathing Bathing Steps Patient Completed: Chest, Right Arm, Abdomen, Right upper leg, Left upper leg Bathing: 3: Mod-Patient completes 5-7 62f 10 parts or 50-74%  FIM - Upper Body Dressing/Undressing Upper body dressing/undressing steps patient completed: Thread/unthread left bra strap, Thread/unthread left sleeve of pullover shirt/dress, Put head through opening of pull over shirt/dress, Thread/unthread right bra strap Upper body dressing/undressing: 3: Mod-Patient completed 50-74% of tasks FIM - Lower Body Dressing/Undressing Lower body dressing/undressing steps patient completed: Thread/unthread left underwear leg, Thread/unthread left pants leg Lower body dressing/undressing: 2: Max-Patient completed 25-49% of tasks  FIM - Musician Devices: Grab bar or rail for support Toileting: 1: Two helpers  FIM - Radio producer Devices: Recruitment consultant Transfers: 2-To toilet/BSC: Max A (lift and lower assist), 2-From toilet/BSC: Max A (lift and lower assist)  FIM - Bed/Chair Transfer Bed/Chair Transfer Assistive Devices: Bed rails, Arm rests Bed/Chair Transfer: 3: Supine > Sit: Mod A (lifting assist/Pt. 50-74%/lift 2 legs, 2: Bed > Chair or W/C: Max A (lift and lower assist), 2: Chair or W/C > Bed: Max A (lift and lower assist)  FIM - Locomotion: Wheelchair Distance: 5 ft Locomotion: Wheelchair: 1: Total Assistance/staff pushes wheelchair (Pt<25%) FIM - Locomotion: Ambulation Locomotion: Ambulation Assistive Devices: Other (comment) (wall rail) Ambulation/Gait  Assistance: 1: +2 Total assist Locomotion: Ambulation: 0: Activity did not occur  Comprehension Comprehension Mode: Auditory Comprehension: 4-Understands basic 75 - 89% of the time/requires  cueing 10 - 24% of the time  Expression Expression Mode: Verbal Expression: 3-Expresses basic 50 - 74% of the time/requires cueing 25 - 50% of the time. Needs to repeat parts of sentences.  Social Interaction Social Interaction: 4-Interacts appropriately 75 - 89% of the time - Needs redirection for appropriate language or to initiate interaction.  Problem Solving Problem Solving: 2-Solves basic 25 - 49% of the time - needs direction more than half the time to initiate, plan or complete simple activities  Memory Memory: 3-Recognizes or recalls 50 - 74% of the time/requires cueing 25 - 49% of the time   Medical Problem List and Plan: 1. 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 DVT Prophylaxis/Anticoagulation: Subcutaneous heparin initiated 05/24/2014. Monitor for any bleeding episodes 3. Pain Management: Tylenol generally effective 4. Seizure prophylaxis. Keppra 500 mg twice a day. EEG negative 5. Neuropsych: This patient is not capable of making decisions on her own behalf. 6. Skin/Wound Care: Routine skin checks 7. Fluids/Electrolytes/Nutrition: encourage PO. Monitor be met 8. Dysphagia. Dysphagia #3 thin liquids. Will discuss with SLP  9. MRSA PCR screening positive. Contact precautions 10. Hypertension. Toprol-XL 25 mg daily. Monitor with increased mobility 11. Hypothyroidism. Synthroid  LOS (Days) 26 A FACE TO FACE EVALUATION WAS PERFORMED  Maralyn Witherell E 06/29/2014, 10:55 AM

## 2014-06-29 NOTE — Progress Notes (Signed)
Occupational Therapy Session Note  Patient Details  Name: [name] CourtGeraldine Seier MRN: [id number] Date of Birth: Jun 10, 1945  Today's Date: 06/29/2014 OT Individual Time: 1300-1330 OT Individual Time Calculation (min): 30 min    Skilled Therapeutic Interventions/Progress Updates:    1:1  NMR with focus on transitional movement with transfers, sit to supine to sidelying to return to sitting, functional use of left UE with visual attention to it using mirror etc. Pt demonstrated some shoulder elevation and depression and scapular protraction and retraction with UE supported against gravity (trace). Pt with difficulty in supine due to not receiving visual feedback like she did in sitting. Pt able to transfer to right and left with mod A with extra time.   Therapy Documentation Precautions:  Precautions Precautions: Fall Precaution Comments: R field cut/R inattention, pushes to R  Restrictions Weight Bearing Restrictions: No Pain: Pain Assessment Pain Assessment: No/denies pain ADL: ADL ADL Comments: Refer to FIM  See FIM for current functional status  Therapy/Group: Individual Therapy  Roney MansSmith, Ruffus Kamaka [facility] Hospital[facility] 06/29/2014, 2:33 PM

## 2014-06-29 NOTE — Progress Notes (Signed)
Physical Therapy Session Note  Patient Details  Name: Carolyn CourtGeraldine Musa MRN: [id number] Date of Birth: Mar 02, 1945  Today's Date: 06/29/2014 PT Individual Time: 1100-1200 PT Individual Time Calculation (min): 60 min   Short Term Goals: Week 4:  PT Short Term Goal 1 (Week 4): = LTGs due to anticipated LOS  Skilled Therapeutic Interventions/Progress Updates:   Session focused on RLE NMR, functional transfers, gait, and stair training. Patient sitting in wheelchair, requesting to use bathroom. Performed squat pivot transfer wheelchair <> commode via Bobath technique with mod A overall and +2 to stabilize wheelchair. Patient propelled wheelchair using L hemi technique x 150 ft with min A. Performed step taps to 3" step using LLE with LUE support on rail with total A to stabilize RLE to prevent knee buckling, x 5 and x 10 with seated rest between. Patient required max cues for breathing due to tendency to hold breath with exertion/valsalva. Gait using hemiwalker x 20 ft with total A to advance/stabilize RLE and +2 for close wheelchair follow, max multimodal cues for safe sequencing when to step with LLE and advancing HW. Patient tended to place HW in front of LLE, resulting in limited LLE step length. With all transfers, patient required verbal/tactile cues to push up from wheelchair with LUE instead of pulling up on objects. Seated RLE NMR: LAQ 2 x 10, trace hip flexion x 10, resisted hip abduction/adduction x 10. Patient left sitting in wheelchair with quick release belt on and 1/2 lap tray in place at RN station.   Therapy Documentation Precautions:  Precautions Precautions: Fall Precaution Comments: R field cut/R inattention, pushes to R  Restrictions Weight Bearing Restrictions: No Pain: Pain Assessment Pain Assessment: No/denies pain Locomotion : Ambulation Ambulation/Gait Assistance: 1: +2 Total assist;1: +1 Total assist   See FIM for current functional status  Therapy/Group: Individual  Therapy  Kerney ElbeVarner, Croy Drumwright A 06/29/2014, 12:15 PM

## 2014-06-29 NOTE — Progress Notes (Signed)
Speech Language Pathology Daily Session Note  Patient Details  Name: Carolyn [name] MRN: [id number] Date of Birth: Aug 04, 1945  Today's Date: 06/29/2014 SLP Individual Time: 0800-0900 SLP Individual Time Calculation (min): 60 min  Short Term Goals: Week 4: SLP Short Term Goal 1 (Week 4): Patient will consume current diet without overt s/s of aspiration with supervision verbal cues for use of swallow strategies.  SLP Short Term Goal 2 (Week 4): Patient will demonstrate efficient mastication with regular textures demonstrated by minimal oral residue and no overt s/s of aspiration with superivsion verbal cues.  SLP Short Term Goal 3 (Week 4): Patient will scan to the right field of enviornment with supervision verbal cues during functional and familiar tasks. SLP Short Term Goal 4 (Week 4): Patient will demonstrate selective attention to functional and familiar tasks for 30 minutes with supervision verbal cues for redirection.  SLP Short Term Goal 5 (Week 4): Patient will name functional items with Min A phonemic cues with 75% accuracy.  SLP Short Term Goal 6 (Week 4): Patient will verbalize wants/needs at the phrase level with Min A multimodal cues.   Skilled Therapeutic Interventions: Skilled treatment session focused on cognitive-linguistic goals. Upon arrival, patient was sitting upright in bed. Patient was transferred to wheelchair via the [facility] and required Min A verbal and visual cues to follow directions throughout task. Once in wheelchair, patient verbalized need to use the bathroom and [facility]. Patient was eventually transferred back to the wheelchair and participated in basic and mildly complex auditory comprehension tasks. Patient answered basic yes/no questions with 100% accuracy and complex yes/no questions with 80% accuracy. 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  SLP also provided  Min A phonemic cues for patient to complete a decoding and word-finding task at the phrase level when given visual cues. Patient left upright in wheelchair with quick release belt in place and handed off to OT. Continue with current plan of care.    FIM:  Comprehension Comprehension Mode: Auditory Comprehension: 4-Understands basic 75 - 89% of the time/requires cueing 10 - 24% of the time Expression Expression Mode: Verbal Expression: 3-Expresses basic 50 - 74% of the time/requires cueing 25 - 50% of the time. Needs to repeat parts of sentences. Social Interaction Social Interaction: 4-Interacts appropriately 75 - 89% of the time - Needs redirection for appropriate language or to initiate interaction. Problem Solving Problem Solving: 3-Solves basic 50 - 74% of the time/requires cueing 25 - 49% of the time Memory Memory: 3-Recognizes or recalls 50 - 74% of the time/requires cueing 25 - 49% of the time  Pain Pain Assessment Pain Assessment: No/denies pain  Therapy/Group: Individual Therapy  Sandra Brents 06/29/2014, 2:23 PM

## 2014-06-29 NOTE — Progress Notes (Addendum)
Nutrition Follow-up  DOCUMENTATION CODES:  Obesity unspecified  INTERVENTION:  Boost Breeze, Magic cup, Snacks   Encourage adequate PO intake.   NUTRITION DIAGNOSIS:  Inadequate oral intake related to  (decreased appetite) as evidenced by  (varied meal completion of 25-90%); ongoing  GOAL:  Patient will meet greater than or equal to 90% of their needs; progressing  MONITOR:  PO intake, Supplement acceptance, Weight trends, Labs, I & O's  REASON FOR ASSESSMENT:  Consult Poor PO  ASSESSMENT: Pt with history of hypertension, recent status post AAA stent grafting. Admitted 05/20/2014 after after developing severe headache right arm weakness while at work and suddenly became unresponsive. Cranial CT scan showed left ICH with 3 mm midline shift. Underwent left frontoparietal craniotomy for evacuation of intracerebral hematoma 05/20/2014.  Meal completion has been varied from 5-80%. Pt currently has Resource Breeze ordered BID. RD to modify orders to TID to maximize caloric and protein intake. Of note, weight has been trending down. Will continue to monitor.   Labs and medications reviewed.   Height:  Ht Readings from Last 1 Encounters:  05/20/14 5\' 4"  (1.626 m)    Weight:  Wt Readings from Last 1 Encounters:  06/23/14 178 lb 6.4 oz (80.922 kg)    Ideal Body Weight:  54.5 kg  Wt Readings from Last 10 Encounters:  06/23/14 178 lb 6.4 oz (80.922 kg)  06/03/14 199 lb 4.7 oz (90.4 kg)    BMI:  Body mass index is 30.61 kg/(m^2).  Estimated Nutritional Needs:  Kcal:  1750-1950  Protein:  85-95 grams  Fluid:  1.7 - 1.9 L/day  Skin:   (Incision on L head, non-pitting RUE, RLE edema)  Diet Order:  DIET DYS 3 Room service appropriate?: Yes; Fluid consistency:: Thin  EDUCATION NEEDS:  No education needs identified at this time   Intake/Output Summary (Last 24 hours) at 06/29/14 1518 Last data filed at 06/29/14 1300  Gross per 24 hour  Intake    720 ml   Output      0 ml  Net    720 ml    Last BM:  6/6  Marijean NiemannStephanie La, MS, RD, LDN Pager # [phone] After hours/ weekend pager # [phone]

## 2014-06-30 ENCOUNTER — Inpatient Hospital Stay (HOSPITAL_COMMUNITY): Payer: BLUE CROSS/BLUE SHIELD | Admitting: Physical Therapy

## 2014-06-30 ENCOUNTER — Inpatient Hospital Stay (HOSPITAL_COMMUNITY): Payer: BLUE CROSS/BLUE SHIELD | Admitting: Speech Pathology

## 2014-06-30 ENCOUNTER — Inpatient Hospital Stay (HOSPITAL_COMMUNITY): Payer: BLUE CROSS/BLUE SHIELD

## 2014-06-30 LAB — MRSA PCR SCREENING: MRSA by PCR: NEGATIVE

## 2014-06-30 NOTE — Progress Notes (Signed)
Physical Therapy Session Note  Patient Details  Name: [name] CourtGeraldine Skow MRN: [id number] Date of Birth: [birth year]  Today's Date: 06/30/2014 PT Individual Time: 1300-1400  PT Individual Time Calculation (min): 60 min  Short Term Goals: Week 4:  PT Short Term Goal 1 (Week 4): = LTGs due to anticipated LOS   Skilled Therapeutic Interventions/Progress Updates:  Patient performed squat pivot transfer training from wheelchair to and from level and unlevel mat tables and to and from regular bed in ADL apartment with overall mod-max A and verbal/tactile cues for hand placement and head hips relationship. Patient transferred supine <> sit on mat table and regular bed with min-mod A overall, max verbal/tactile cues for sequencing. Gait using hemiwalker x 24 ft with total A to advance/stabilize RLE and +2 for close wheelchair follow, mod > min multimodal cues for safe sequencing when to step with LLE and advancing HW. Patient demonstrated improved safety with advancing HW and decreased anxiety noted compared to previous sessions. Patient left sitting in wheelchair with quick release belt on, 1/2 lap tray in place, and call bell within reach.   Therapy Documentation Precautions:  Precautions Precautions: Fall Precaution Comments: R field cut/R inattention, pushes to R  Restrictions Weight Bearing Restrictions: No Pain: Pain Assessment Pain Assessment: No/denies pain Locomotion : Ambulation Ambulation/Gait Assistance: 1: +2 Total assist   See FIM for current functional status  Therapy/Group: Individual Therapy  Kerney ElbeVarner, Iman Orourke A 06/30/2014, 2:21 PM

## 2014-06-30 NOTE — Progress Notes (Signed)
Speech Language Pathology Daily Session Note  Patient Details  Name: Carolyn [name] MRN: [id number] Date of Birth: [birth year]  Today's Date: 06/30/2014 SLP Individual Time: 1100-1200 SLP Individual Time Calculation (min): 60 min  Short Term Goals: Week 4: SLP Short Term Goal 1 (Week 4): Patient will consume current diet without overt s/s of aspiration with supervision verbal cues for use of swallow strategies.  SLP Short Term Goal 2 (Week 4): Patient will demonstrate efficient mastication with regular textures demonstrated by minimal oral residue and no overt s/s of aspiration with superivsion verbal cues.  SLP Short Term Goal 3 (Week 4): Patient will scan to the right field of enviornment with supervision verbal cues during functional and familiar tasks. SLP Short Term Goal 4 (Week 4): Patient will demonstrate selective attention to functional and familiar tasks for 30 minutes with supervision verbal cues for redirection.  SLP Short Term Goal 5 (Week 4): Patient will name functional items with Min A phonemic cues with 75% accuracy.  SLP Short Term Goal 6 (Week 4): Patient will verbalize wants/needs at the phrase level with Min A multimodal cues.   Skilled Therapeutic Interventions: Skilled treatment session focused on dysphagia and language goals. SLP facilitated session with trials of regular textures. Patient demonstrated efficient mastication demonstrated by minimal oral residue without overt s/s of aspiration. Recommend trial tray of regular textures prior to upgrade. Patient also consumed lunch meal of Dys. 3 textures with thin liquids without overt s/s of aspiration and required supervision verbal cues to self-monitor and correct right anterior spillage. SLP also facilitated session by providing Mod A phonemic and visual cues for naming of functional items and for verbal expression at the phrase level during a picture description task. Patient continues to require Min A verbal and question  cues to self-monitor errors but Mod-Max A to self-correct. Patient left upright in wheelchair with quick release belt on and all needs within reach.  Continue with current plan of care.    FIM:  Comprehension Comprehension Mode: Auditory Comprehension: 4-Understands basic 75 - 89% of the time/requires cueing 10 - 24% of the time Expression Expression Mode: Verbal Expression: 3-Expresses basic 50 - 74% of the time/requires cueing 25 - 50% of the time. Needs to repeat parts of sentences. Social Interaction Social Interaction: 4-Interacts appropriately 75 - 89% of the time - Needs redirection for appropriate language or to initiate interaction. Problem Solving Problem Solving: 3-Solves basic 50 - 74% of the time/requires cueing 25 - 49% of the time Memory Memory: 3-Recognizes or recalls 50 - 74% of the time/requires cueing 25 - 49% of the time FIM - Eating Eating Activity: 5: Supervision/cues;5: Set-up assist for open containers  Pain Pain Assessment Pain Assessment: No/denies pain  Therapy/Group: Individual Therapy  Carolyn [name] 06/30/2014, 4:03 PM

## 2014-06-30 NOTE — Progress Notes (Signed)
Occupational Therapy Session Note  Patient Details  Name: Carolyn [name] MRN: [id number] Date of Birth: 19-Dec-1945  Today's Date: 06/30/2014 OT Individual Time: 0900-1000 OT Individual Time Calculation (min): 60 min    Short Term Goals: Week 5:  OT Short Term Goal 1 (Week 5): Focus on LTGs due expected discharge date  Skilled Therapeutic Interventions/Progress Updates:    Pt seen for ADL retraining with focus on sit<>stand, functional transfers, attention to R, and hemi dressing technique. Pt received supine in bed. Introduced stedy to complete functional transfer bed>w/c in order to increase safety during nursing assisted transfers. Pt required mod A sit<>stand with stedy and demonstrated good postural control in perched position. Therapist noted on safety plan and informed RN and NT of change on safety plan. Completed bathing and dressing at sink with mod cues for sequencing and min cues for attention to R. Pt required mod A sit<>stand from w/c with pt assisting with clothing management over L hip. Pt required min cues for carryover of hemi dressing technique for LB dressing, however required max cues for UB dressing. Completed transfer training w/c<>mat table 2x with pt requiring max A +2 (second person to stabilize w/c) with rest breaks between each transfer. Pt returned to room and left sitting in w/c with all needs in reach.   Therapy Documentation Precautions:  Precautions Precautions: Fall Precaution Comments: R field cut/R inattention, pushes to R  Restrictions Weight Bearing Restrictions: No General:   Vital Signs:   Pain: No report of pain  See FIM for current functional status  Therapy/Group: Individual Therapy  Daneil Danerkinson, Cyler Kappes N 06/30/2014, 10:40 AM

## 2014-06-30 NOTE — Progress Notes (Signed)
Social Work Patient ID: Mikle Bosworth Equihua, female   DOB: 1945/02/19, 69 y.o.   MRN: [id number]   FAMILY CONFERENCE 06/29/14:  Met with pt's daughter, Elmo Putt and son, Mikki Santee along with other son, Ronalee Belts, on speaker phone to review pt's current functional status and discuss d/c plans.  PT, OT and ST all present and reviewing pt's current status.  Family asking appropriate questions of therapies and seem to have a very good understanding of information presented.  Following discussion with therapisst, I reviewed d/c plans with family.  They are all agreed with need to change d/c plan to SNF and we discussed insurance coverage and facilities of choice.  Their hope is to maximize the amount of time that pt can receive daily therapies as she continues to make good progress but not yet at a level that family can meet care needs at home.  Daughter reports that she has already spoken with her mother about needing to pursue SNF and that she is in agreement.  Have begun bed search and will keep pt, family and tx team posted of offers/ plans.  Jaiveer Panas, LCSW

## 2014-06-30 NOTE — Patient Care Conference (Signed)
Inpatient RehabilitationTeam Conference and Plan of Care Update Date: 06/29/2014   Time: 3:10 PM    Patient Name: [name] CourtGeraldine Dorn      Medical Record Number: [id number]  Date of Birth: [birth year] Sex: Female         Room/Bed: 4W11C/4W11C-01 Payor Info: Payor: BLUE CROSS BLUE SHIELD / Plan: BCBS OTHER / Product Type: *No Product type* /    Admitting Diagnosis: NON TRAUMATIC ICH  Admit Date/Time:  06/03/2014  3:45 PM Admission Comments: No comment available   Primary Diagnosis:  Nontraumatic hemorrhage of cerebral hemisphere Principal Problem: Nontraumatic hemorrhage of cerebral hemisphere  Patient Active Problem List   Diagnosis Date Noted  . Embolic stroke   . HLD (hyperlipidemia)   . Aphasia   . Right hemiplegia   . Endotracheal tube present   . Stroke   . 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 Acute respiratory failure, unspecified whether with hypoxia or hypercapnia   . Acute respiratory failure   . Bradycardia 05/23/2014  . Cerebral brain hemorrhage   . Respiratory failure   . Junctional bradycardia   . Arterial hypotension   . Nontraumatic hemorrhage of cerebral hemisphere 05/20/2014  . Hypertensive emergency 05/20/2014    Expected Discharge Date: Expected Discharge Date:  (SNF)  Team Members Present: Physician leading conference: Dr. Claudette LawsAndrew Kirsteins Social Worker Present: Amada JupiterLucy Aengus Sauceda, LCSW Nurse Present: Carmie EndAngie Joyce, RN PT Present: Bayard Huggerebecca Varner, PT OT Present: Ardis Rowanom Lanier, Darolyn RuaOTA;Kayla Perkinson, OT SLP Present: Feliberto Gottronourtney Payne, SLP PPS Coordinator present : Tora DuckMarie Noel, RN, CRRN     Current Status/Progress Goal Weekly Team Focus  Medical   fear and anxiety limiting mobility, amb 20', 2 person assist to toilet  +1 assist transfer  reduce anxiety with transfers   Bowel/Bladder   Pt continent of bowel and bladder. brief in use- incont at times bladder due to urgancy HS. LBM 6-6  manage bowel and bladder mod assist  contiune use of soft call bell and encourage  BSC   Swallow/Nutrition/ Hydration   Dys. 3 textures with thin liquids, Supervision-Min A for use of swallow strategies   Min A  trials of upgraded textures    ADL's   mod A UB dressing and bathing, max A LB dressing, total A +2 toileting, max A most transfers however occasional max A +2 for safety with toilet transfers  mod A UB dressing, bathing, and standing balance, max A toileting, toilet transfer and LB dressing, min A grooming  sitting and standing balance, attention to R, visual scanning, postural control, functional transfers, safety, R NME   Mobility   supervision sitting balance, min-mod A bed mobility, min-mod A sit <> stand, mod-max A of 1-2 persons depending on need to stabilize w/c, gait x 20 ft with HW and +2 A, total A x 2 for two 3" steps  mod-max A overall  sitting/standing balance, gait, stairs, postural control, RLE NMR, attending to R, transfers, activity tolerance, pt/family education   Communication   Overall Mod A for functional communication   Mod A  verbal expression at phrase level during structured tasks    Safety/Cognition/ Behavioral Observations  Mod-Max A  Min-Mod A  attention, attention to right, problem solving, awareness   Pain   no complaints of pain. scheduled tylenol. Pt refuses medications.          Skin   incision to left scalp OTA CDI           Rehab Goals Patient on target to meet rehab goals: Yes *  See Care Plan and progress notes for long and short-term goals.  Barriers to Discharge: family cannot provide 24/7 assist    Possible Resolutions to Barriers:  SNF placement    Discharge Planning/Teaching Needs:  Plan has "officially" changed to SNF      Team Discussion:  Very good progress with PT this week but little functional change.  Fear/ anxiety still interfere with sessions at times. Ambulated 20' with hemi walker and +2 assist!  Improved verbal communication of needs.  SW reports family expected to change plan to SNF with family  conference today.  Revisions to Treatment Plan:  Likely to change d/c plan to SNF    Continued Need for Acute Rehabilitation Level of Care: The patient requires daily medical management by a physician with specialized training in physical medicine and rehabilitation for the following conditions: Daily direction of a multidisciplinary physical rehabilitation program to ensure safe treatment while eliciting the highest outcome that is of practical value to the patient.: Yes Daily medical management of patient stability for increased activity during participation in an intensive rehabilitation regime.: Yes Daily analysis of laboratory values and/or radiology reports with any subsequent need for medication adjustment of medical intervention for : Neurological problems;Other  Jackie Littlejohn 06/30/2014, 6:29 AM

## 2014-07-01 ENCOUNTER — Inpatient Hospital Stay (HOSPITAL_COMMUNITY): Payer: BLUE CROSS/BLUE SHIELD | Admitting: Physical Therapy

## 2014-07-01 ENCOUNTER — Inpatient Hospital Stay (HOSPITAL_COMMUNITY): Payer: BLUE CROSS/BLUE SHIELD

## 2014-07-01 ENCOUNTER — Inpatient Hospital Stay (HOSPITAL_COMMUNITY): Payer: BLUE CROSS/BLUE SHIELD | Admitting: Speech Pathology

## 2014-07-01 NOTE — Progress Notes (Signed)
Physical Therapy Weekly Progress Note  Patient Details  Name: Carolyn [name] MRN: [id number] Date of Birth: 07/06/1945  Beginning of progress report period: June 23, 2014 End of progress report period: July 01, 2014  Today's Date: 07/01/2014 PT Individual Time: 1100-1200 and 1500-1547 PT Individual Time Calculation (min): 60 min and 47 min  Patient has met 3 of 9 long term goals. Patient continues to make slow steady progress since last reporting period. Patient currently performs squat pivot transfers with mod-max A of one person with occasional need for +2 to stabilize wheelchair due to chair sliding, bed mobility with min-mod A, sitting balance with supervision, standing balance with mod-max A, wheelchair mobility with min A, and gait < 25 ft using hemiwalker with total A x 1 and +2 for wheelchair follow for safety. Patient continues to demonstrate improved activity tolerance as well as antigravity R knee extension and gravity eliminated gross RLE flexion/extension active movement.  Patient continues to demonstrate the following deficits: RUE/RLE hemiplegia, muscle weakness, decreased sitting/standing balance, decreased balance reactions, pushing tendencies to R, decreased postural control, decreased endurance, impaired timing and sequencing, abnormal tone, unbalanced muscle activation, motor apraxia, decreased coordination, decreased motor planning, decreased sensation, decreased midline orientation, decreased attention to right, aphasia, decreased initiation, decreased attention, decreased awareness, decreased problem solving, decreased safety awareness, decreased memory, and delayed processing  and therefore will continue to benefit from skilled PT intervention to enhance overall performance with activity tolerance, balance, postural control, ability to compensate for deficits, functional use of  right upper extremity and right lower extremity, attention, awareness and coordination.  See  Patient's Care Plan for progression toward long term goals.  Patient progressing toward long term goals. Continue plan of care.  Skilled Therapeutic Interventions/Progress Updates:   Session 1: Focus on transfer training, standing balance, weight shifting and standing balance reactions, and functional ambulation. Patient propelled wheelchair using L hemi technique x 150 ft with min A for downward approximation through LLE and cues for sequencing and technique. In ADL apartment, practiced squat pivot transfers to R and L x 2 each direction from wheelchair <> low compliant couch surface with mod-max A overall, cues for hand placement and head hips relationship. RLE LAQ in supported sitting x 10 with 3 sec hold. Performed sit <> stand from couch with min-mod A overall with min cues for pushing up from arm rest using LUE. Patient able to maintain static standing balance with therapist providing downward approximation at RLE with LUE support on wheelchair handle progressed to no UE support, min > mod A with gradual lean to R and patient with absent righting reaction to weight shift back to L with max multimodal cues. Patient able to tolerate standing multiple trials up to 2 minutes. Gait using hemiwalker 2 x 10 ft with max A to advance/stabilize RLE and +2 for close wheelchair follow, min > mod verbal cues for safe sequencing and timing of advancing HW and stepping. Patient able to initiate advancement of RLE for first time but required assist for safe placement due to absent ankle dorsiflexion activation. Patient's daughter present to observe second half of session and updated on patient progress. Patient left sitting in wheelchair to return to room with daughter.   Session 2:  Patient received sitting in wheelchair, c/o constipation but declined attempting to use toilet at this time. Patient performed squat pivot transfers with mod A overall, continued cues for hand placement and head hips relationship. To  facilitate standing weight shift to R, patient performed sit <>  stand from mat table with min-mod A and max A for standing balance to take playing card from table and match to target forward, up, and to R with LUE. Patient with continued discomfort due to feeling constipated and repeatedly stating, "I can't," during therapy. Therefore returned to room and performed squat pivot transfer to/from toilet with max A and +2 to stabilize wheelchair, +2 for clothing management in standing. Patient unable to void. Returned to bed and transferred sit > supine with min A, max cues and assist to stabilize RLE for bridging in order to reposition in bed. Patient left semi reclined in bed with alarm on and needs within reach, RN notified of patient c/o constipation and unsuccessful toileting attempt.   Therapy Documentation Precautions:  Precautions Precautions: Fall Precaution Comments: R field cut/R inattention, pushes to R  Restrictions Weight Bearing Restrictions: No Pain: Pain Assessment Pain Assessment: No/denies pain  See FIM for current functional status  Therapy/Group: Individual Therapy  Laretta Alstrom 07/01/2014, 12:25 PM

## 2014-07-01 NOTE — Progress Notes (Signed)
Occupational Therapy Session Note  Patient Details  Name: Carolyn [name] MRN: [id number] Date of Birth: [birth year]  Today's Date: 07/01/2014 OT Individual Time: 2130-8657 OT Individual Time Calculation (min): 58 min    Short Term Goals: Week 5:  OT Short Term Goal 1 (Week 5): Focus on LTGs due expected discharge date  Skilled Therapeutic Interventions/Progress Updates:    Pt seen for ADL retraining with focus on functional transfers, attention to R, R NMR, postural control, and cognitive remediation. Pt received supine in bed eager to shower. Pt completed supine>sit with min A and mod cues for sequencing. Completed squat pivot transfer bed>w/c with mod A. Completed sit<>stand with min A as another therapist switched w/c>rolling shower chair. Pt completed bathing at shower level with mod A and min cues for sequencing. Pt initiated washing RUE independently while visually attending to it. Completed sit<>stand from rolling shower chair with mod A to transition to w/c. Pt required min cues to verbally recall sequencing with hemi dressing techniques. Pt identified 50% of clothing items this AM with mod cues. Pt required min A sit<>stand from w/c and mod-max A dynamic standing balance during clothing management around waist. Engaged in light weight bearing to RUE while in standing for approx 15-20 seconds and provided PROM to RUE. At end of session pt left sitting in w/c with all needs in reach. Note pt demonstrated less impulsivity during transitional movements this AM.  Therapy Documentation Precautions:  Precautions Precautions: Fall Precaution Comments: R field cut/R inattention, pushes to R  Restrictions Weight Bearing Restrictions: No General:   Vital Signs:  Pain: Pain Assessment Pain Assessment: No/denies pain  See FIM for current functional status  Therapy/Group: Individual Therapy  Daneil Dan 07/01/2014, 12:05 PM

## 2014-07-01 NOTE — Progress Notes (Signed)
Speech Language Pathology Weekly Progress and Session Note  Patient Details  Name: Carolyn [name] MRN: [id number] Date of Birth: 1945-04-02  Beginning of progress report period: June 24, 2014 End of progress report period: July 01, 2014  Today's Date: 07/01/2014 SLP Individual Time: 1300-1330 SLP Individual Time Calculation (min): 30 min  Short Term Goals: Week 4: SLP Short Term Goal 1 (Week 4): Patient will consume current diet without overt s/s of aspiration with supervision verbal cues for use of swallow strategies.  SLP Short Term Goal 1 - Progress (Week 4): Met SLP Short Term Goal 2 (Week 4): Patient will demonstrate efficient mastication with regular textures demonstrated by minimal oral residue and no overt s/s of aspiration with superivsion verbal cues.  SLP Short Term Goal 2 - Progress (Week 4): Met SLP Short Term Goal 3 (Week 4): Patient will scan to the right field of enviornment with supervision verbal cues during functional and familiar tasks. SLP Short Term Goal 3 - Progress (Week 4): Not met SLP Short Term Goal 4 (Week 4): Patient will demonstrate selective attention to functional and familiar tasks for 30 minutes with supervision verbal cues for redirection.  SLP Short Term Goal 4 - Progress (Week 4): Met SLP Short Term Goal 5 (Week 4): Patient will name functional items with Min A phonemic cues with 75% accuracy.  SLP Short Term Goal 5 - Progress (Week 4): Met SLP Short Term Goal 6 (Week 4): Patient will verbalize wants/needs at the phrase level with Min A multimodal cues.  SLP Short Term Goal 6 - Progress (Week 4): Met    New Short Term Goals: Week 5: SLP Short Term Goal 1 (Week 5): Patient will consume current diet without overt s/s of aspiration with Mod I for use of swallow strategies.  SLP Short Term Goal 2 (Week 5): Patient will scan to the right field of enviornment with supervision verbal cues during functional and familiar tasks. SLP Short Term Goal 3 (Week 5):  Patient will demonstrate selective attention to functional and familiar tasks for 60 minutes with supervision verbal cues for redirection in moderately distracting enviornment.   SLP Short Term Goal 4 (Week 5): Patient will complete verbal expression at the phrase level with structured tasks with Min A multimodal cues with 75% accuracy.  SLP Short Term Goal 5 (Week 5): Patient will self-monitor and correct verbal errors with Max A multimodal cues.   Weekly Progress Updates: Patient has made functional gains and has met 5 of 6 STG's this reporting period due to increased swallowing function, selective attention, naming and expression of wants/needs. Currently, patient is consuming regular textures with thin liquids without overt s/s of aspiration and requires supervision verbal cues for use of swallowing compensatory strategies. Patient continues to demonstrate increased functional communication and is able to make her needs known and participate in a basic conversation at the phrase level with Min-Mod A multimodal cues and extra time. Patient is also naming functional items with 100% accuracy with Mod A multimodal cues but continues to require Max-Total A to self-correct verbal errors. Patient also requires overall supervision verbal cues for selective attention and Min A verbal cues for attention to left field of environment, recall of information and functional problem solving.  Patient/family education ongoing. Patient would benefit from continued skilled SLP intervention to maximize her cognitive and swallowing function and functional communication prior to discharge.   Intensity: Minumum of 1-2 x/day, 30 to 90 minutes Frequency: 3 to 5 out of 7 days Duration/Length  of Stay: TBD due to SNF placement  Treatment/Interventions: Cognitive remediation/compensation;Cueing hierarchy;Environmental controls;Dysphagia/aspiration precaution training;Functional tasks;Internal/external aids;Patient/family  education;Speech/Language facilitation;Therapeutic Activities   Daily Session  Skilled Therapeutic Interventions: Skilled treatment session focused on dysphagia goals. SLP facilitated session by providing set-up assist to open containers and supervision verbal cues for problem solving with self-feeding.  Patient consumed lunch meal of regular textures with thin liquids without overt s/s of aspiration and demonstrated efficient mastication and oral clearance of residue. Patient also required supervision verbal cues for use of small bites and a slow rate of self-feeding. Recommend patient upgrade to regular textures with thin liquids and continue full supervision. Patient left upright in wheelchair with quick release belt in place and all needs within reach. Continue with current plan of care.     FIM:  Comprehension Comprehension Mode: Auditory Comprehension: 4-Understands basic 75 - 89% of the time/requires cueing 10 - 24% of the time Expression Expression Mode: Verbal Expression: 3-Expresses basic 50 - 74% of the time/requires cueing 25 - 50% of the time. Needs to repeat parts of sentences. Social Interaction Social Interaction: 5-Interacts appropriately 90% of the time - Needs monitoring or encouragement for participation or interaction. Problem Solving Problem Solving: 4-Solves basic 75 - 89% of the time/requires cueing 10 - 24% of the time Memory Memory: 4-Recognizes or recalls 75 - 89% of the time/requires cueing 10 - 24% of the time FIM - Eating Eating Activity: 5: Supervision/cues;5: Set-up assist for open containers General    Pain Pain Assessment Pain Assessment: No/denies pain  Therapy/Group: Individual Therapy  Illana Nolting 07/01/2014, 5:27 PM

## 2014-07-01 NOTE — Progress Notes (Signed)
Subjective/Complaints: Remains aphasic however she does smile and says good morning. Voices no complaints currently. She indicates some problem with her bowel movements though it is difficult to elicit what the issue is By mouth intake was fair 20-50% of meals fluid intake is good Patient is continent of bladder and bowel had a small formed stool this morning  ROS: very limited secondary to mental status, aphasia  Objective: Vital Signs: Blood pressure 127/48, pulse 80, temperature 99.2 F (37.3 C), temperature source Oral, resp. rate 18, weight 82.8 kg (182 lb 8.7 oz), SpO2 95 %. No results found. Results for orders placed or performed during the hospital encounter of 06/03/14 (from the past 72 hour(s))  MRSA PCR Screening     Status: None   Collection Time: 06/30/14 11:03 AM  Result Value Ref Range   MRSA by PCR NEGATIVE NEGATIVE    Comment:        The GeneXpert MRSA Assay (FDA approved for NASAL specimens only), is one component of a comprehensive MRSA colonization surveillance program. It is not intended to diagnose MRSA infection nor to guide or monitor treatment for MRSA infections.      HEENT: left scalp wound with scab no evidence of drainage Cardio: RRR and no murmurs Resp: CTA B/L and unlabored GI: BS positive and nontender, nondistended Extremity:  Pulses positive and No Edema Skin:   Wound C/D/I and left scalp Neuro: Confused, Abnormal Sensory reduced sensation right upper extremity, left-sided strength is 4/5 in the upper and lower limbAbnormal Motor 0/5 right upper extremity, 0/5 left lower extremity and Aphasic Musc/Skel:  Other no pain with upper limb or lower limb range of motion Gen. no acute distress   Assessment/Plan: 1. Functional deficits secondary to left intracranial hemorrhage status post evacuation 05/20/2014 with right hemiplegia and aphasia which require 3+ hours per day of interdisciplinary therapy in a comprehensive inpatient rehab  setting. Physiatrist is providing close team supervision and 24 hour management of active medical problems listed below. Physiatrist and rehab team continue to assess barriers to discharge/monitor patient progress toward functional and medical goals.   FIM: FIM - Bathing Bathing Steps Patient Completed: Chest, Right Arm, Abdomen, Right upper leg, Left upper leg, Front perineal area Bathing: 3: Mod-Patient completes 5-7 64f10 parts or 50-74%  FIM - Upper Body Dressing/Undressing Upper body dressing/undressing steps patient completed: Thread/unthread left bra strap, Thread/unthread left sleeve of pullover shirt/dress, Put head through opening of pull over shirt/dress, Thread/unthread right bra strap Upper body dressing/undressing: 3: Mod-Patient completed 50-74% of tasks FIM - Lower Body Dressing/Undressing Lower body dressing/undressing steps patient completed: Thread/unthread left pants leg, Don/Doff left shoe, Thread/unthread left underwear leg Lower body dressing/undressing: 2: Max-Patient completed 25-49% of tasks  FIM - Toileting Toileting steps completed by patient: Performs perineal hygiene Toileting Assistive Devices: Grab bar or rail for support Toileting: 1: Two helpers  FIM - TRadio producerDevices: BRecruitment consultantTransfers: 1-Two helpers (+2 to stabilize w/c)  FIM - BControl and instrumentation engineerDevices: Bed rails, Arm rests Bed/Chair Transfer: 3: Chair or W/C > Bed: Mod A (lift or lower assist), 2: Bed > Chair or W/C: Max A (lift and lower assist)  FIM - Locomotion: Wheelchair Distance: 5 ft Locomotion: Wheelchair: 4: Travels 150 ft or more: maneuvers on rugs and over door sillls with minimal assistance (Pt.>75%) FIM - Locomotion: Ambulation Locomotion: Ambulation Assistive Devices: WMuseum/gallery curatorAmbulation/Gait Assistance: 1: +2 Total assist Locomotion: Ambulation: 1: Two helpers  Comprehension  Comprehension  Mode: Auditory Comprehension: 4-Understands basic 75 - 89% of the time/requires cueing 10 - 24% of the time  Expression Expression Mode: Verbal Expression: 3-Expresses basic 50 - 74% of the time/requires cueing 25 - 50% of the time. Needs to repeat parts of sentences.  Social Interaction Social Interaction: 5-Interacts appropriately 90% of the time - Needs monitoring or encouragement for participation or interaction.  Problem Solving Problem Solving: 4-Solves basic 75 - 89% of the time/requires cueing 10 - 24% of the time  Memory Memory: 4-Recognizes or recalls 75 - 89% of the time/requires cueing 10 - 24% of the time   Medical Problem List and Plan: 1. 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 DVT Prophylaxis/Anticoagulation: Subcutaneous heparin initiated 05/24/2014. Monitor for any bleeding episodes 3. Pain Management: Tylenol generally effective 4. Seizure prophylaxis. Keppra 500 mg twice a day. EEG negative 5. Neuropsych: This patient is not capable of making decisions on her own behalf. 6. Skin/Wound Care: Routine skin checks 7. Fluids/Electrolytes/Nutrition: encourage PO. Monitor be met 8. Dysphagia. Dysphagia #3 thin liquids. Will discuss with SLP  9. MRSA PCR screening positive. Contact precautions 10. Hypertension. Toprol-XL 25 mg daily. Monitor with increased mobility 11. Hypothyroidism. Synthroid  LOS (Days) 28 A FACE TO FACE EVALUATION WAS PERFORMED  KIRSTEINS,ANDREW E 07/01/2014, 2:34 PM

## 2014-07-02 ENCOUNTER — Inpatient Hospital Stay (HOSPITAL_COMMUNITY): Payer: BLUE CROSS/BLUE SHIELD

## 2014-07-02 ENCOUNTER — Inpatient Hospital Stay (HOSPITAL_COMMUNITY): Payer: BLUE CROSS/BLUE SHIELD | Admitting: Rehabilitation

## 2014-07-02 ENCOUNTER — Inpatient Hospital Stay (HOSPITAL_COMMUNITY): Payer: BLUE CROSS/BLUE SHIELD | Admitting: Physical Therapy

## 2014-07-02 ENCOUNTER — Inpatient Hospital Stay (HOSPITAL_COMMUNITY): Payer: BLUE CROSS/BLUE SHIELD | Admitting: Speech Pathology

## 2014-07-02 NOTE — Progress Notes (Signed)
Physical Therapy Session Note  Patient Details  Name: Carolyn [name] MRN: [id number] Date of Birth: 10-23-1945  Today's Date: 07/02/2014 PT Individual Time: 1045-1130 PT Individual Time Calculation (min): 45 min   Short Term Goals: Week 5:  PT Short Term Goal 1 (Week 5): = LTGs due to anticipated LOS  Skilled Therapeutic Interventions/Progress Updates:   Pt received sitting in w/c in room, agreeable to therapy session.  Assisted to/from therapy gym via w/c at total A level for time management and energy conservation.  Once in therapy gym, pt stating needing to use restroom, therefore assisted back to restroom and with assist from nurse tech performed toilet transfer via use of stedy for safety.  Pt able to perform peri care on her own, but provided assist for clothing management to ensure midline posture in standing.  Stood again at sink to wash/dry hands with min/mod A and cues to attend to RUE during task, as well as cues for sequencing through washing hands.  Assisted back to therapy gym and transferred w/c<>mat at mod A level with cues for sequencing and set up as well as facilitation vis Bobath method for increased forward weight shift to elevate buttocks and WB through LEs.  Remainder of session focused on dynamic sit<>mini stands as well as sit<>stands with reaching to promote increased activation of R quads/glutes as well as forward weight shift and reaching to the L to decreased pusher tendencies.  Requires mod to max A for task, esp when reaching to the L due to fear of falling and pusher tendencies.  Assisted back to w/c and left in room in w/c with quick release belt donned and SLP entering room for next session.   Therapy Documentation Precautions:  Precautions Precautions: Fall Precaution Comments: R field cut/R inattention, pushes to R  Restrictions Weight Bearing Restrictions: No   Vital Signs: Therapy Vitals Temp: 98.7 F (37.1 C) Temp Source: Oral Pulse Rate: 82 Resp:  18 BP: (!) 127/48 mmHg Patient Position (if appropriate): Lying Oxygen Therapy SpO2: 94 % O2 Device: Not Delivered Pain: Pt with mild c/o pain in R arm with gentle stretching.    See FIM for current functional status  Therapy/Group: Individual Therapy  Vista Deck 07/02/2014, 7:53 AM

## 2014-07-02 NOTE — Progress Notes (Signed)
Subjective/Complaints: Denies any problems, pains, or new issues although ROS remains limited secondary to mental status, aphasia  Objective: Vital Signs: Blood pressure 124/56, pulse 85, temperature 98.7 F (37.1 C), temperature source Oral, resp. rate 18, weight 82.8 kg (182 lb 8.7 oz), SpO2 94 %. No results found. Results for orders placed or performed during the hospital encounter of 06/03/14 (from the past 72 hour(s))  MRSA PCR Screening     Status: None   Collection Time: 06/30/14 11:03 AM  Result Value Ref Range   MRSA by PCR NEGATIVE NEGATIVE    Comment:        The GeneXpert MRSA Assay (FDA approved for NASAL specimens only), is one component of a comprehensive MRSA colonization surveillance program. It is not intended to diagnose MRSA infection nor to guide or monitor treatment for MRSA infections.      HEENT: left scalp wound with scab no evidence of drainage Cardio: RRR and no murmurs Resp: CTA B/L and unlabored GI: BS positive and nontender, nondistended Extremity:  Pulses positive and No Edema Skin:   Wound C/D/I and left scalp Neuro: Confused, Abnormal Sensory reduced sensation right upper extremity, left-sided strength is 4/5 in the upper and lower limbAbnormal Motor 0/5 right upper extremity, 0/5 left lower extremity and exp aphasia---able to communicate in short sentences and phrases Musc/Skel:  Other no pain with upper limb or lower limb range of motion Gen. no acute distress   Assessment/Plan: 1. Functional deficits secondary to left intracranial hemorrhage status post evacuation 05/20/2014 with right hemiplegia and aphasia which require 3+ hours per day of interdisciplinary therapy in a comprehensive inpatient rehab setting. Physiatrist is providing close team supervision and 24 hour management of active medical problems listed below. Physiatrist and rehab team continue to assess barriers to discharge/monitor patient progress toward functional and  medical goals.   FIM: FIM - Bathing Bathing Steps Patient Completed: Chest, Right Arm, Abdomen, Right upper leg, Left upper leg, Front perineal area Bathing: 3: Mod-Patient completes 5-7 63f 10 parts or 50-74%  FIM - Upper Body Dressing/Undressing Upper body dressing/undressing steps patient completed: Thread/unthread left bra strap, Thread/unthread left sleeve of pullover shirt/dress, Put head through opening of pull over shirt/dress, Thread/unthread right bra strap Upper body dressing/undressing: 3: Mod-Patient completed 50-74% of tasks FIM - Lower Body Dressing/Undressing Lower body dressing/undressing steps patient completed: Thread/unthread left pants leg, Thread/unthread left underwear leg Lower body dressing/undressing: 2: Max-Patient completed 25-49% of tasks  FIM - Toileting Toileting steps completed by patient: Performs perineal hygiene Toileting Assistive Devices: Grab bar or rail for support Toileting: 1: Two helpers  FIM - Diplomatic Services operational officer Devices: Psychiatrist Transfers: 1-Two helpers (+2 to stabilize w/c)  FIM - Banker Devices: Bed rails, Arm rests Bed/Chair Transfer: 3: Chair or W/C > Bed: Mod A (lift or lower assist), 2: Bed > Chair or W/C: Max A (lift and lower assist)  FIM - Locomotion: Wheelchair Distance: 5 ft Locomotion: Wheelchair: 4: Travels 150 ft or more: maneuvers on rugs and over door sillls with minimal assistance (Pt.>75%) FIM - Locomotion: Ambulation Locomotion: Ambulation Assistive Devices: Chief Operating Officer Ambulation/Gait Assistance: 1: +2 Total assist Locomotion: Ambulation: 1: Two helpers  Comprehension Comprehension Mode: Auditory Comprehension: 4-Understands basic 75 - 89% of the time/requires cueing 10 - 24% of the time  Expression Expression Mode: Verbal Expression: 2-Expresses basic 25 - 49% of the time/requires cueing 50 - 75% of the time. Uses single  words/gestures.  Social  Interaction Social Interaction: 5-Interacts appropriately 90% of the time - Needs monitoring or encouragement for participation or interaction.  Problem Solving Problem Solving: 7-Solves complex problems: Recognizes & self-corrects  Memory Memory: 4-Recognizes or recalls 75 - 89% of the time/requires cueing 10 - 24% of the time   Medical Problem List and Plan: 1. 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 DVT Prophylaxis/Anticoagulation: Subcutaneous heparin initiated 05/24/2014. Monitor for any bleeding episodes 3. Pain Management: Tylenol generally effective 4. Seizure prophylaxis. Keppra 500 mg twice a day. EEG negative 5. Neuropsych: This patient is still not capable of making decisions on her own behalf. 6. Skin/Wound Care: Routine skin checks 7. Fluids/Electrolytes/Nutrition: encourage PO. Monitor bmet 8. Dysphagia. Dysphagia #3 thin liquids.   9. MRSA PCR screening positive. Contact precautions 10. Hypertension. Toprol-XL 25 mg daily. Normotensive at present.  11. Hypothyroidism. Synthroid  LOS (Days) 29 A FACE TO FACE EVALUATION WAS PERFORMED  SWARTZ,ZACHARY T 07/02/2014, 10:05 AM

## 2014-07-02 NOTE — Progress Notes (Signed)
Physical Therapy Session Note  Patient Details  Name: Carolyn [name] MRN: [id number] Date of Birth: [birth year]  Today's Date: 07/02/2014 PT Individual Time: 1400-1500 PT Individual Time Calculation (min): 60 min   Short Term Goals: Week 5:  PT Short Term Goal 1 (Week 5): = LTGs due to anticipated LOS  Skilled Therapeutic Interventions/Progress Updates:   Session focused on sitting balance, postural control, midline orientation, transfers, and functional ambulation. Patient received resting in bed. With HOB flat, patient transferred supine > sit with mod A, assist for R knee flexion and bringing RUE across body with patient able to elevate trunk with LUE.   Performed squat pivot transfer bed > wheelchair with mod A x 1, cues for hand placement, head hips relationship, forward lean, and safety due to impulsivity with transfers.   Patient performed sit <> stand transfers with min-mod A x 1 with therapist under RUE, cues to push up from surface using LUE.   Patient performed side stepping approx 3-5 ft  to/from wheelchair via 3 musketeers technique with +2 A in order to transfer on/off physioball. Performed static sitting on large physio ball on base with dycem under B feet to prevent sliding, +2 A for safety. Patient required max verbal/visual/tactile cues with mirror in front in order to work on midline orientation and sitting balance/postural control.   Gait x 7 ft with progression to use of heavy duty [facility], patient able to advance RLE with assist to place R foot due to R adductor/supinator tone, +2 for wheelchair follow for safety. Patient required max cues for safe sequencing to advance cane and when to step with LLE. Patient required max cues for encouragement and to keep eyes open during ambulation. After seated rest, donned R ankle dorsiflexion wrap assist for improved foot placement, therapist guiding RLE due to adduction tone with patient able to advance RLE and downward approximation at  RLE during stance phase, therapist facilitating lateral weight shifting. Patient ambulated 2 x 10 ft using [facility] with mod A overall and +2 for wheelchair follow and improved equal step length and sequencing use of cane.   Patient left sitting in wheelchair with quick release belt and 1/2 lap tray donned, call bell within reach.   Therapy Documentation Precautions:  Precautions Precautions: Fall Precaution Comments: R field cut/R inattention, pushes to R  Restrictions Weight Bearing Restrictions: No Pain: Pain Assessment Pain Assessment: No/denies pain  See FIM for current functional status  Therapy/Group: Individual Therapy  Kerney Elbe 07/02/2014, 4:03 PM

## 2014-07-02 NOTE — Progress Notes (Signed)
Occupational Therapy Session Note  Patient Details  Name: Carolyn [name] MRN: [id number] Date of Birth: 11/11/1945  Today's Date: 07/02/2014 OT Individual Time: 0900-1000 OT Individual Time Calculation (min): 60 min    Short Term Goals: Week 5:  OT Short Term Goal 1 (Week 5): Focus on LTGs due expected discharge date  Skilled Therapeutic Interventions/Progress Updates:    Pt resting in bed upon arrival and agreeable to therapy.  Pt engaged in BADL retraining including bed mobility, functional transfers, sit<>stand, standing balance, and bathing/dressing with sit<>stand from w/c at sink.  Pt continues to require mod verbal cues for hemi dressing techniques and attending to/positioning RUE.  Pt requires max A for standing balance to facilitate bathing buttocks and pulling up pants.  Pt requires min verbal cues for task initiation and sequencing during bathing tasks.  Pt remained in w/c with QRB and half lap tray in place.  Therapy Documentation Precautions:  Precautions Precautions: Fall Precaution Comments: R field cut/R inattention, pushes to R  Restrictions Weight Bearing Restrictions: No Pain:  Pt denied pain ADL: ADL ADL Comments: Refer to FIM  See FIM for current functional status  Therapy/Group: Individual Therapy  Rich Brave 07/02/2014, 10:02 AM

## 2014-07-02 NOTE — Progress Notes (Signed)
Speech Language Pathology Daily Session Note  Patient Details  Name: Carolyn [name] MRN: [id number] Date of Birth: 1945/10/06  Today's Date: 07/02/2014 SLP Individual Time: 1130-1200 SLP Individual Time Calculation (min): 30 min  Short Term Goals: Week 5: SLP Short Term Goal 1 (Week 5): Patient will consume current diet without overt s/s of aspiration with Mod I for use of swallow strategies.  SLP Short Term Goal 2 (Week 5): Patient will scan to the right field of enviornment with supervision verbal cues during functional and familiar tasks. SLP Short Term Goal 3 (Week 5): Patient will demonstrate selective attention to functional and familiar tasks for 60 minutes with supervision verbal cues for redirection in moderately distracting enviornment.   SLP Short Term Goal 4 (Week 5): Patient will complete verbal expression at the phrase level with structured tasks with Min A multimodal cues with 75% accuracy.  SLP Short Term Goal 5 (Week 5): Patient will self-monitor and correct verbal errors with Max A multimodal cues.   Skilled Therapeutic Interventions: Skilled treatment session focused on dysphagia goals. SLP facilitated session by providing skilled observation with lunch meal of regular textures. Patient consumed a salad without dressing and demonstrated efficient mastication without overt s/s of aspiration but required supervision verbal cues for a slow rate of self-feeding. Patient also named food items/utensils on tray with supervision phonemic cues and required supervision verbal cues with extra time for problem solving with self-feeding. Patient left in wheelchair with quick release belt in place and all needs within reach. Continue with current plan of care.    FIM:  Comprehension Comprehension Mode: Auditory Comprehension: 4-Understands basic 75 - 89% of the time/requires cueing 10 - 24% of the time Expression Expression Mode: Verbal Expression: 3-Expresses basic 50 - 74% of the  time/requires cueing 25 - 50% of the time. Needs to repeat parts of sentences. Social Interaction Social Interaction: 5-Interacts appropriately 90% of the time - Needs monitoring or encouragement for participation or interaction. Problem Solving Problem Solving: 4-Solves basic 75 - 89% of the time/requires cueing 10 - 24% of the time Memory Memory: 4-Recognizes or recalls 75 - 89% of the time/requires cueing 10 - 24% of the time FIM - Eating Eating Activity: 5: Supervision/cues;5: Set-up assist for open containers;5: Set-up assist for cut food  Pain Pain Assessment Pain Assessment: No/denies pain  Therapy/Group: Individual Therapy  Shakeisha Horine 07/02/2014, 4:48 PM

## 2014-07-03 ENCOUNTER — Inpatient Hospital Stay (HOSPITAL_COMMUNITY): Payer: BLUE CROSS/BLUE SHIELD | Admitting: Occupational Therapy

## 2014-07-03 ENCOUNTER — Inpatient Hospital Stay (HOSPITAL_COMMUNITY): Payer: BLUE CROSS/BLUE SHIELD

## 2014-07-03 NOTE — Progress Notes (Signed)
Physical Therapy Note  Patient Details  Name: Carolyn [name] MRN: [id number] Date of Birth: [birth year] Today's Date: 07/03/2014    Time: [phone] 40 minutes  1:1 no c/o pain.  Session focused on sit to stand transfers with stedy and standing wt shifts in stedy for toileting and dressing activities.  Pt requires mod/max cues for attending to R UE and LE during functional tasks.  Pt required mod manual facilitation for wt shifts to the R in standing.  Pt able to improve midline posture during with tactile cuing.   Amadea Keagy 07/03/2014, 10:07 AM

## 2014-07-03 NOTE — Progress Notes (Signed)
Occupational Therapy Session Note  Patient Details  Name: Carolyn [name] MRN: [id number] Date of Birth: Oct 30, 1945  Today's Date: 07/03/2014 OT Individual Time: 1430-1515 OT Individual Time Calculation (min): 45 min    Short Term Goals: Week 5:  OT Short Term Goal 1 (Week 5): Focus on LTGs due expected discharge date  Skilled Therapeutic Interventions/Progress Updates:  Upon entering the room, pt supine in bed with no c/o pain this session. Supine >sit with Mod A to EOB. Mod A stand pivot transfer to wheelchair with Bobath technique secondary to pushing. Pt requiring max verbal cues for safety awareness of R UE during session in order to not injure self. OT propelled pt to day room for focus on scanning task, sequencing, and problem solving. Pt oriented to time,location, situation with 3 written choice provided pt able to select correctly. Pt engaged in "old maid" card game which required pt to scan table from L to R to find matches for cards. Mod verbal cues for scanning strategy during card game. Pt also requiring min verbal cues for sequencing and turn taking. Pt attempting to say names located on cards but does better when repeating after therapist. Pt returned to room via wheelchair and total A. Mod A stand pivot transfer wheelchair >bed. Mod A sit >supine. Pt supine in bed with call bell and bed alarm activated upon exiting the room.   Therapy Documentation Precautions:  Precautions Precautions: Fall Precaution Comments: R field cut/R inattention, pushes to R  Restrictions Weight Bearing Restrictions: No Vital Signs: Therapy Vitals Temp: 98.1 F (36.7 C) Temp Source: Oral Pulse Rate: 84 Resp: 18 BP: (!) 142/60 mmHg Patient Position (if appropriate): Lying Oxygen Therapy SpO2: 97 % O2 Device: Not Delivered ADL: ADL ADL Comments: Refer to FIM  See FIM for current functional status  Therapy/Group: Individual Therapy  Lowella Grip 07/03/2014, 5:15 PM

## 2014-07-03 NOTE — Progress Notes (Signed)
Subjective/Complaints: Had a good night. RN helping clean her up this morning when i arrived.  Objective: Vital Signs: Blood pressure 140/59, pulse 84, temperature 98.3 F (36.8 C), temperature source Oral, resp. rate 18, weight 82.8 kg (182 lb 8.7 oz), SpO2 95 %. No results found. Results for orders placed or performed during the hospital encounter of 06/03/14 (from the past 72 hour(s))  MRSA PCR Screening     Status: None   Collection Time: 06/30/14 11:03 AM  Result Value Ref Range   MRSA by PCR NEGATIVE NEGATIVE    Comment:        The GeneXpert MRSA Assay (FDA approved for NASAL specimens only), is one component of a comprehensive MRSA colonization surveillance program. It is not intended to diagnose MRSA infection nor to guide or monitor treatment for MRSA infections.      HEENT: left scalp wound with scab no evidence of drainage Cardio: RRR and no murmurs Resp: CTA B/L and unlabored GI: BS positive and nontender, nondistended Extremity:  Pulses positive and No Edema Skin:   Wound C/D/I and left scalp Neuro: Confused, Abnormal Sensory reduced sensation right upper extremity, left-sided strength is 4/5 in the upper and lower ex prox to distal. 0/5 right upper extremity, 0/5 left lower extremity. Continued exp aphasia---able to communicate in short sentences and phrases Musc/Skel:  Other no pain with upper limb or lower limb range of motion Gen. no acute distress   Assessment/Plan: 1. Functional deficits secondary to left intracranial hemorrhage status post evacuation 05/20/2014 with right hemiplegia and aphasia which require 3+ hours per day of interdisciplinary therapy in a comprehensive inpatient rehab setting. Physiatrist is providing close team supervision and 24 hour management of active medical problems listed below. Physiatrist and rehab team continue to assess barriers to discharge/monitor patient progress toward functional and medical goals.   FIM: FIM -  Bathing Bathing Steps Patient Completed: Chest, Right Arm, Abdomen, Right upper leg, Left upper leg, Front perineal area Bathing: 3: Mod-Patient completes 5-7 58f 10 parts or 50-74%  FIM - Upper Body Dressing/Undressing Upper body dressing/undressing steps patient completed: Thread/unthread left bra strap, Thread/unthread left sleeve of pullover shirt/dress, Put head through opening of pull over shirt/dress, Thread/unthread right bra strap Upper body dressing/undressing: 3: Mod-Patient completed 50-74% of tasks FIM - Lower Body Dressing/Undressing Lower body dressing/undressing steps patient completed: Thread/unthread left pants leg, Thread/unthread left underwear leg Lower body dressing/undressing: 2: Max-Patient completed 25-49% of tasks  FIM - Toileting Toileting steps completed by patient: Performs perineal hygiene Toileting Assistive Devices: Grab bar or rail for support Toileting: 1: Two helpers  FIM - Diplomatic Services operational officer Devices: Psychiatrist Transfers: 1-Two helpers (+2 to stabilize w/c)  FIM - Press photographer Assistive Devices: Arm rests Bed/Chair Transfer: 3: Supine > Sit: Mod A (lifting assist/Pt. 50-74%/lift 2 legs, 3: Bed > Chair or W/C: Mod A (lift or lower assist), 3: Chair or W/C > Bed: Mod A (lift or lower assist)  FIM - Locomotion: Wheelchair Distance: 5 ft Locomotion: Wheelchair: 1: Total Assistance/staff pushes wheelchair (Pt<25%) FIM - Locomotion: Ambulation Locomotion: Ambulation Assistive Devices: Occupational hygienist Ambulation/Gait Assistance: 1: +2 Total assist Locomotion: Ambulation: 1: Two helpers  Comprehension Comprehension Mode: Auditory Comprehension: 4-Understands basic 75 - 89% of the time/requires cueing 10 - 24% of the time  Expression Expression Mode: Verbal Expression: 3-Expresses basic 50 - 74% of the time/requires cueing 25 - 50% of the time. Needs to repeat parts of sentences.  Social  Interaction Social Interaction: 5-Interacts appropriately 90% of the time - Needs monitoring or encouragement for participation or interaction.  Problem Solving Problem Solving: 4-Solves basic 75 - 89% of the time/requires cueing 10 - 24% of the time  Memory Memory: 4-Recognizes or recalls 75 - 89% of the time/requires cueing 10 - 24% of the time   Medical Problem List and Plan: 1. 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 DVT Prophylaxis/Anticoagulation: Subcutaneous heparin initiated 05/24/2014. Monitor for any bleeding episodes 3. Pain Management: Tylenol generally effective 4. Seizure prophylaxis. Keppra 500 mg twice a day. EEG negative 5. Neuropsych: This patient is still not capable of making decisions on her own behalf. 6. Skin/Wound Care: Routine skin checks 7. Fluids/Electrolytes/Nutrition: fair intake. Recheck labs tomorrow 8. Dysphagia. Dysphagia #3 thin liquids.   9. MRSA PCR screening positive. Contact precautions 10. Hypertension. Toprol-XL 25 mg daily. Normotensive at present.  11. Hypothyroidism. Synthroid  LOS (Days) 30 A FACE TO FACE EVALUATION WAS PERFORMED  SWARTZ,ZACHARY T 07/03/2014, 9:00 AM

## 2014-07-04 ENCOUNTER — Inpatient Hospital Stay (HOSPITAL_COMMUNITY): Payer: BLUE CROSS/BLUE SHIELD | Admitting: Physical Therapy

## 2014-07-04 LAB — CBC
HCT: 36 % (ref 36.0–46.0)
Hemoglobin: 11.2 g/dL — ABNORMAL LOW (ref 12.0–15.0)
MCH: 26.8 pg (ref 26.0–34.0)
MCHC: 31.1 g/dL (ref 30.0–36.0)
MCV: 86.1 fL (ref 78.0–100.0)
Platelets: 301 10*3/uL (ref 150–400)
RBC: 4.18 MIL/uL (ref 3.87–5.11)
RDW: 15.1 % (ref 11.5–15.5)
WBC: 7.8 10*3/uL (ref 4.0–10.5)

## 2014-07-04 LAB — URINALYSIS, ROUTINE W REFLEX MICROSCOPIC
Bilirubin Urine: NEGATIVE
GLUCOSE, UA: NEGATIVE mg/dL
Hgb urine dipstick: NEGATIVE
Ketones, ur: NEGATIVE mg/dL
Nitrite: POSITIVE — AB
PH: 6.5 (ref 5.0–8.0)
PROTEIN: NEGATIVE mg/dL
SPECIFIC GRAVITY, URINE: 1.012 (ref 1.005–1.030)
Urobilinogen, UA: 1 mg/dL (ref 0.0–1.0)

## 2014-07-04 LAB — BASIC METABOLIC PANEL
ANION GAP: 12 (ref 5–15)
BUN: 9 mg/dL (ref 6–20)
CALCIUM: 9.3 mg/dL (ref 8.9–10.3)
CHLORIDE: 100 mmol/L — AB (ref 101–111)
CO2: 28 mmol/L (ref 22–32)
CREATININE: 0.71 mg/dL (ref 0.44–1.00)
GFR calc Af Amer: >60 mL/min (ref >60)
Glucose, Bld: 104 mg/dL — ABNORMAL HIGH (ref 65–99)
Potassium: 3.7 mmol/L (ref 3.5–5.1)
Sodium: 140 mmol/L (ref 135–145)

## 2014-07-04 LAB — URINE MICROSCOPIC-ADD ON

## 2014-07-04 NOTE — Progress Notes (Signed)
Subjective/Complaints: No complaints. Slept well. Denies pain/headache.  ROS: Pt denies fever, rash/itching, headache, blurred or double vision, nausea, vomiting, abdominal pain, diarrhea, chest pain, shortness of breath, palpitations, dysuria, dizziness, neck or back pain, bleeding, anxiety, or depression (appears Y/N accurate given improvement in language)   Objective: Vital Signs: Blood pressure 130/61, pulse 83, temperature 99 F (37.2 C), temperature source Oral, resp. rate 18, weight 82.8 kg (182 lb 8.7 oz), SpO2 95 %. No results found. Results for orders placed or performed during the hospital encounter of 06/03/14 (from the past 72 hour(s))  CBC     Status: Abnormal   Collection Time: 07/04/14  7:03 AM  Result Value Ref Range   WBC 7.8 4.0 - 10.5 K/uL   RBC 4.18 3.87 - 5.11 MIL/uL   Hemoglobin 11.2 (L) 12.0 - 15.0 g/dL   HCT 36.0 36.0 - 46.0 %   MCV 86.1 78.0 - 100.0 fL   MCH 26.8 26.0 - 34.0 pg   MCHC 31.1 30.0 - 36.0 g/dL   RDW 15.1 11.5 - 15.5 %   Platelets 301 150 - 400 K/uL  Basic metabolic panel     Status: Abnormal   Collection Time: 07/04/14  7:03 AM  Result Value Ref Range   Sodium 140 135 - 145 mmol/L   Potassium 3.7 3.5 - 5.1 mmol/L   Chloride 100 (L) 101 - 111 mmol/L   CO2 28 22 - 32 mmol/L   Glucose, Bld 104 (H) 65 - 99 mg/dL   BUN 9 6 - 20 mg/dL   Creatinine, Ser 0.71 0.44 - 1.00 mg/dL   Calcium 9.3 8.9 - 10.3 mg/dL   GFR calc non Af Amer >60 >60 mL/min   GFR calc Af Amer >60 >60 mL/min    Comment: (NOTE) The eGFR has been calculated using the CKD EPI equation. This calculation has not been validated in all clinical situations. eGFR's persistently <60 mL/min signify possible Chronic Kidney Disease.    Anion gap 12 5 - 15     HEENT: left scalp wound with scab no evidence of drainage Cardio: RRR and no murmurs Resp: CTA B/L and unlabored GI: BS positive and nontender, nondistended Extremity:  Pulses positive and No Edema Skin:   Wound C/D/I  and left scalp Neuro:   Abnormal Sensory reduced sensation right upper extremity, left-sided strength is 4/5 in the upper and lower ex prox to distal. 0/5 right upper extremity, 0/5 left lower extremity. Exp aphasia. Does fairly well with Y/N answers. Able to identify "june" as "july" on calendar but could not shift off "july" Musc/Skel:  Other no pain with upper limb or lower limb range of motion Gen. no acute distress   Assessment/Plan: 1. Functional deficits secondary to left intracranial hemorrhage status post evacuation 05/20/2014 with right hemiplegia and aphasia which require 3+ hours per day of interdisciplinary therapy in a comprehensive inpatient rehab setting. Physiatrist is providing close team supervision and 24 hour management of active medical problems listed below. Physiatrist and rehab team continue to assess barriers to discharge/monitor patient progress toward functional and medical goals.   FIM: FIM - Bathing Bathing Steps Patient Completed: Chest, Right Arm, Abdomen, Right upper leg, Left upper leg, Front perineal area Bathing: 3: Mod-Patient completes 5-7 62f10 parts or 50-74%  FIM - Upper Body Dressing/Undressing Upper body dressing/undressing steps patient completed: Thread/unthread left bra strap, Thread/unthread left sleeve of pullover shirt/dress, Put head through opening of pull over shirt/dress, Thread/unthread right bra strap Upper body dressing/undressing:  3: Mod-Patient completed 50-74% of tasks FIM - Lower Body Dressing/Undressing Lower body dressing/undressing steps patient completed: Thread/unthread left pants leg, Thread/unthread left underwear leg Lower body dressing/undressing: 2: Max-Patient completed 25-49% of tasks  FIM - Toileting Toileting steps completed by patient: Performs perineal hygiene Toileting Assistive Devices: Grab bar or rail for support Toileting: 1: Two helpers  FIM - Radio producer Devices: Neurosurgeon Transfers: 1-Two helpers (+2 to stabilize w/c)  FIM - Engineer, site Assistive Devices: Arm rests Bed/Chair Transfer: 3: Supine > Sit: Mod A (lifting assist/Pt. 50-74%/lift 2 legs, 3: Bed > Chair or W/C: Mod A (lift or lower assist), 3: Chair or W/C > Bed: Mod A (lift or lower assist)  FIM - Locomotion: Wheelchair Distance: 5 ft Locomotion: Wheelchair: 1: Total Assistance/staff pushes wheelchair (Pt<25%) FIM - Locomotion: Ambulation Locomotion: Ambulation Assistive Devices: Nurse, adult Ambulation/Gait Assistance: 1: +2 Total assist Locomotion: Ambulation: 1: Two helpers  Comprehension Comprehension Mode: Auditory Comprehension: 4-Understands basic 75 - 89% of the time/requires cueing 10 - 24% of the time  Expression Expression Mode: Verbal Expression: 3-Expresses basic 50 - 74% of the time/requires cueing 25 - 50% of the time. Needs to repeat parts of sentences.  Social Interaction Social Interaction: 5-Interacts appropriately 90% of the time - Needs monitoring or encouragement for participation or interaction.  Problem Solving Problem Solving: 4-Solves basic 75 - 89% of the time/requires cueing 10 - 24% of the time  Memory Memory: 4-Recognizes or recalls 75 - 89% of the time/requires cueing 10 - 24% of the time   Medical Problem List and Plan: 1. 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 DVT Prophylaxis/Anticoagulation: Subcutaneous heparin initiated 05/24/2014. Monitor for any bleeding episodes 3. Pain Management: Tylenol generally effective 4. Seizure prophylaxis. Keppra 500 mg twice a day. EEG negative 5. Neuropsych: This patient is still not capable of making decisions on her own behalf. 6. Skin/Wound Care: Routine skin checks. nutrition 7. Fluids/Electrolytes/Nutrition: fair intake. Labs reviewed and normal 8. Dysphagia. Dysphagia #3 thin liquids.   9. MRSA PCR screening  positive. Contact precautions 10. Hypertension. Toprol-XL 25 mg daily. Normotensive at present.  11. Hypothyroidism. Synthroid  LOS (Days) 31 A FACE TO FACE EVALUATION WAS PERFORMED  Edra Riccardi T 07/04/2014, 9:26 AM

## 2014-07-04 NOTE — Progress Notes (Signed)
Physical Therapy Session Note  Patient Details  Name: Carolyn [name] MRN: [id number] Date of Birth: [birth year]  Today's Date: 07/04/2014 PT Individual Time: 3276-1470 PT Individual Time Calculation (min): 30 min   Short Term Goals: Week 5:  PT Short Term Goal 1 (Week 5): = LTGs due to anticipated LOS  Skilled Therapeutic Interventions/Progress Updates:   Session focused on functional transfers and standing balance. Patient received sitting in wheelchair, reporting need for bathroom with questioning. Performed squat pivot transfer wheelchair > commode and sit <> stand for clothing management with max A x 1, cues for safe L hand placement. Patient performed sit <> stand with max A while NT performed hygiene and clothing management, then transferred via stand pivot back to wheelchair with +2 A to stabilize wheelchair. Patient transported to day room and performed sit <> stand x 3 from wheelchair using [facility] with min A, mod-max A for standing balance with verbal/tactile cues for weight shift to L due to lateral lean/pushing to R. Patient able to tolerate standing < 60 sec before attempting to sit. Patient left sitting in wheelchair with all needs within reach.    Therapy Documentation Precautions:  Precautions Precautions: Fall Precaution Comments: R field cut/R inattention, pushes to R  Restrictions Weight Bearing Restrictions: No Pain: Pain Assessment Pain Assessment: No/denies pain  See FIM for current functional status  Therapy/Group: Individual Therapy  Kerney Elbe 07/04/2014, 12:08 PM

## 2014-07-05 ENCOUNTER — Inpatient Hospital Stay (HOSPITAL_COMMUNITY): Payer: BLUE CROSS/BLUE SHIELD

## 2014-07-05 ENCOUNTER — Inpatient Hospital Stay (HOSPITAL_COMMUNITY): Payer: BLUE CROSS/BLUE SHIELD | Admitting: Speech Pathology

## 2014-07-05 ENCOUNTER — Inpatient Hospital Stay (HOSPITAL_COMMUNITY): Payer: BLUE CROSS/BLUE SHIELD | Admitting: Physical Therapy

## 2014-07-05 NOTE — Progress Notes (Signed)
Occupational Therapy Session Note  Patient Details  Name: Carolyn [name] MRN: [id number] Date of Birth: 13-Feb-1945  Today's Date: 07/05/2014 OT Individual Time: 0805-0900 OT Individual Time Calculation (min): 55 min    Short Term Goals: Week 3:  OT Short Term Goal 1 (Week 3): Pt will complete LB dressing with max A+1 OT Short Term Goal 1 - Progress (Week 3): Progressing toward goal OT Short Term Goal 2 (Week 3): Pt will consistently complete toilet transfer with max A and second person present for stabilizing w/c OT Short Term Goal 2 - Progress (Week 3): Met OT Short Term Goal 3 (Week 3): Pt will verbalize type of clothing during dressing task with 75% accuracy OT Short Term Goal 3 - Progress (Week 3): Met OT Short Term Goal 4 (Week 3): Pt will scan to right to locate 50% of self-care items with mod cues OT Short Term Goal 4 - Progress (Week 3): Met OT Short Term Goal 5 (Week 3): Pt will complete functional activity in standing for 75 seconds with max A for standing balance OT Short Term Goal 5 - Progress (Week 3): Met  Skilled Therapeutic Interventions/Progress Updates:    Pt seen for ADL retraining with focus on R NMR, functional transfers, attention to R, cognitive remediation, sit<>stand, and activity tolerance. Pt received supine in bed using bedpan therefore missed first 5 min of session. Pt rolled to R at supervision level as therapist removed bedpan and provided total A for hygiene. Completed supine>sit with mod A and stand pivot transfer bed>rolling shower chair with mod A. Pt completed bathing at shower level requiring min cues for sequencing and awareness of positioning of RUE. Completed sit<>stand from rolling shower chair with min A as another therapist swapped rolling shower chair for w/c. Pt required mod cues and increased time for problem solving and recall of hemi dressing techniques. Pt completed sit<>stand 5-6x during session with min-mod A and therapist providing assist at  R knee only to prevent from buckling. Pt completed oral care in standing with RUE positioned to facilitate weight bearing. Completed PROM and AAROM exercise to RUE with pt inconsistently demonstrating slight muscle activation in biceps and triceps. Pt noted to have slight digit activation on 2 occasions. Pt unable to demonstrate active movement against gravity. At end of session pt left sitting in w/c with all needs in reach.   Therapy Documentation Precautions:  Precautions Precautions: Fall Precaution Comments: R field cut/R inattention, pushes to R  Restrictions Weight Bearing Restrictions: No General: General OT Amount of Missed Time: 5 Minutes Vital Signs:  Pain:   ADL: ADL ADL Comments: Refer to FIM Exercises:   Other Treatments:    See FIM for current functional status  Therapy/Group: Individual Therapy  Duayne Cal 07/05/2014, 11:39 AM

## 2014-07-05 NOTE — Progress Notes (Addendum)
Subjective/Complaints: No new complaints. Alert, lying in bed. No obvious sob, cp, ha,cough, dizziness, pain ROS limited by language  Objective: Vital Signs: Blood pressure 140/53, pulse 87, temperature 98.3 F (36.8 C), temperature source Oral, resp. rate 19, weight 82.8 kg (182 lb 8.7 oz), SpO2 96 %. No results found. Results for orders placed or performed during the hospital encounter of 06/03/14 (from the past 72 hour(s))  CBC     Status: Abnormal   Collection Time: 07/04/14  7:03 AM  Result Value Ref Range   WBC 7.8 4.0 - 10.5 K/uL   RBC 4.18 3.87 - 5.11 MIL/uL   Hemoglobin 11.2 (L) 12.0 - 15.0 g/dL   HCT 36.0 36.0 - 46.0 %   MCV 86.1 78.0 - 100.0 fL   MCH 26.8 26.0 - 34.0 pg   MCHC 31.1 30.0 - 36.0 g/dL   RDW 15.1 11.5 - 15.5 %   Platelets 301 150 - 400 K/uL  Basic metabolic panel     Status: Abnormal   Collection Time: 07/04/14  7:03 AM  Result Value Ref Range   Sodium 140 135 - 145 mmol/L   Potassium 3.7 3.5 - 5.1 mmol/L   Chloride 100 (L) 101 - 111 mmol/L   CO2 28 22 - 32 mmol/L   Glucose, Bld 104 (H) 65 - 99 mg/dL   BUN 9 6 - 20 mg/dL   Creatinine, Ser 0.71 0.44 - 1.00 mg/dL   Calcium 9.3 8.9 - 10.3 mg/dL   GFR calc non Af Amer >60 >60 mL/min   GFR calc Af Amer >60 >60 mL/min    Comment: (NOTE) The eGFR has been calculated using the CKD EPI equation. This calculation has not been validated in all clinical situations. eGFR's persistently <60 mL/min signify possible Chronic Kidney Disease.    Anion gap 12 5 - 15  Urinalysis, Routine w reflex microscopic (not at [facility])     Status: Abnormal   Collection Time: 07/04/14  8:30 PM  Result Value Ref Range   Color, Urine YELLOW YELLOW   APPearance CLOUDY (A) CLEAR   Specific Gravity, Urine 1.012 1.005 - 1.030   pH 6.5 5.0 - 8.0   Glucose, UA NEGATIVE NEGATIVE mg/dL   Hgb urine dipstick NEGATIVE NEGATIVE   Bilirubin Urine NEGATIVE NEGATIVE   Ketones, ur NEGATIVE NEGATIVE mg/dL   Protein, ur NEGATIVE NEGATIVE  mg/dL   Urobilinogen, UA 1.0 0.0 - 1.0 mg/dL   Nitrite POSITIVE (A) NEGATIVE   Leukocytes, UA LARGE (A) NEGATIVE  Urine microscopic-add on     Status: Abnormal   Collection Time: 07/04/14  8:30 PM  Result Value Ref Range   Squamous Epithelial / LPF FEW (A) RARE   WBC, UA 21-50 <3 WBC/hpf   RBC / HPF 3-6 <3 RBC/hpf   Bacteria, UA FEW (A) RARE   Casts HYALINE CASTS (A) NEGATIVE     HEENT: left scalp wound with scab no evidence of drainage Cardio: RRR and no murmurs Resp: CTA B/L   GI: BS positive and nontender, nondistended Extremity:  Pulses positive and No Edema Skin:   Wound C/D/I and left scalp well healed Neuro: Confused, Abnormal Sensory reduced sensation right upper extremity, left-sided strength is 4/5 in the upper and lower ex prox to distal. 0/5 right upper extremity, 0/5 left lower extremity. Continued exp aphasia---able to communicate in short sentences and phrases only ---still inconsistent Musc/Skel:  Other no pain with upper limb or lower limb range of motion Gen. no acute distress  Assessment/Plan: 1. Functional deficits secondary to left intracranial hemorrhage status post evacuation 05/20/2014 with right hemiplegia and aphasia which require 3+ hours per day of interdisciplinary therapy in a comprehensive inpatient rehab setting. Physiatrist is providing close team supervision and 24 hour management of active medical problems listed below. Physiatrist and rehab team continue to assess barriers to discharge/monitor patient progress toward functional and medical goals.   FIM: FIM - Bathing Bathing Steps Patient Completed: Chest, Right Arm, Abdomen, Right upper leg, Left upper leg, Front perineal area Bathing: 3: Mod-Patient completes 5-7 [id number] parts or 50-74%  FIM - Upper Body Dressing/Undressing Upper body dressing/undressing steps patient completed: Thread/unthread left bra strap, Thread/unthread left sleeve of pullover shirt/dress, Put head through opening of  pull over shirt/dress, Thread/unthread right bra strap Upper body dressing/undressing: 3: Mod-Patient completed 50-74% of tasks FIM - Lower Body Dressing/Undressing Lower body dressing/undressing steps patient completed: Thread/unthread left pants leg, Thread/unthread left underwear leg Lower body dressing/undressing: 2: Max-Patient completed 25-49% of tasks  FIM - Toileting Toileting steps completed by patient: Performs perineal hygiene Toileting Assistive Devices: Grab bar or rail for support Toileting: 1: Two helpers  FIM - TRadio producerDevices: BRecruitment consultantTransfers: 2-To toilet/BSC: Max A (lift and lower assist), 1-Two helpers  FIM - BControl and instrumentation engineerDevices: Arm rests Bed/Chair Transfer: 0: Activity did not occur  FIM - Locomotion: Wheelchair Distance: 5 ft Locomotion: Wheelchair: 1: Total Assistance/staff pushes wheelchair (Pt<25%) FIM - Locomotion: Ambulation Locomotion: Ambulation Assistive Devices: CNurse, adultAmbulation/Gait Assistance: 1: +2 Total assist Locomotion: Ambulation: 0: Activity did not occur  Comprehension Comprehension Mode: Auditory Comprehension: 4-Understands basic 75 - 89% of the time/requires cueing 10 - 24% of the time  Expression Expression Mode: Verbal Expression: 3-Expresses basic 50 - 74% of the time/requires cueing 25 - 50% of the time. Needs to repeat parts of sentences.  Social Interaction Social Interaction: 5-Interacts appropriately 90% of the time - Needs monitoring or encouragement for participation or interaction.  Problem Solving Problem Solving: 4-Solves basic 75 - 89% of the time/requires cueing 10 - 24% of the time  Memory Memory: 4-Recognizes or recalls 75 - 89% of the time/requires cueing 10 - 24% of the time   Medical Problem List and Plan: 1. 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 DVT  Prophylaxis/Anticoagulation: Subcutaneous heparin initiated 05/24/2014. Monitor for any bleeding episodes 3. Pain Management: Tylenol generally effective 4. Seizure prophylaxis. Keppra 500 mg twice a day. EEG negative 5. Neuropsych: This patient is still not capable of making decisions on her own behalf. 6. Skin/Wound Care: Routine skin checks and pressure relief 7. Fluids/Electrolytes/Nutrition:labs reviewed and normal 8. Dysphagia. Dysphagia #3 thin liquids.   9. MRSA PCR screening positive. Contact precautions 10. Hypertension. Continue Toprol-XL 25 mg daily. Normotensive    11. Hypothyroidism. Synthroid  LOS (Days) 32 A FACE TO FACE EVALUATION WAS PERFORMED  Lynnelle Mesmer T 07/05/2014, 8:11 AM

## 2014-07-05 NOTE — Progress Notes (Signed)
Physical Therapy Session Note  Patient Details  Name: Carolyn [name] MRN: [id number] Date of Birth: [birth year]  Today's Date: 07/05/2014 PT Individual Time: 0958-1058 PT Individual Time Calculation (min): 60 min     Therapy Documentation Precautions:  Precautions Precautions: Fall Precaution Comments: R field cut/R inattention, pushes to R  Restrictions Weight Bearing Restrictions: No  Pain: Pain Assessment Pain Assessment: No/denies pain  Transfers: Sit to and from stand transfer with min assist; verbal cues even weight distribution and controlled descent.  Stand pivot transfer mod assist to and from mat  Short sit to and from supine on mat min assist  Supine ther ex: Bridging 2x20 with approximation and right knee and facilitation at hip and pelvis for even weight distribution and glute activation   PNF D1 flexion performed with patient 3x10 RLE; patient able to initiate movement of RLE.   Patient performed standing balance with utilization of mirror for 5 minutes and three minutes focusing on upright posture, even weight distribution and positioning in midline. Patient utilized left wide based quad cane and min assist.   Patient performed step ups on a 4 inch step with RLE 3x max assist with left handrail and right dorsiflexion. Patient required max assist for upright posture, positioning in midline, advancement, placement and stabilization of right lower extremity.  Ambulation: Patient ambulated 20 feet and 18 feet with left wide based quad cane and right dorsiflexion wrap and max assist Patient ambulated with a varied step to and step through gait pattern. Patient required max assist for upright posture, positioning in midline, advancement, placement and stabilization of right lower extremity. 2 assist for wheelchair follow.   Car transfer performed with patient mod assist squat pivot transfer.    Patient tolerated treatment well. Vitals monitored and remained stable  throughout session responding appropriately to activity. Patient was without pain during session. Patient tolerated session with rest breaks throughout. Patient returned to room at end of session with Wheelchair seat belt engaged. Call bell within reach and patient educated not to be up without assistance.    See FIM for current functional status   Therapy/Group: Individual Therapy  Merri Ray 07/05/2014, 11:23 AM

## 2014-07-05 NOTE — Progress Notes (Signed)
Speech Language Pathology Daily Session Notes  Patient Details  Name: Carolyn [name] MRN: [id number] Date of Birth: 04-19-1945  Today's Date: 07/05/2014  Session 1: SLP Individual Time: 1100-1130 SLP Individual Time Calculation (min): 30 min   Session 2: SLP Individual Time: 1305-1405 SLP Individual Time Calculation (min): 60 min  Short Term Goals: Week 5: SLP Short Term Goal 1 (Week 5): Patient will consume current diet without overt s/s of aspiration with Mod I for use of swallow strategies.  SLP Short Term Goal 2 (Week 5): Patient will scan to the right field of enviornment with supervision verbal cues during functional and familiar tasks. SLP Short Term Goal 3 (Week 5): Patient will demonstrate selective attention to functional and familiar tasks for 60 minutes with supervision verbal cues for redirection in moderately distracting enviornment.   SLP Short Term Goal 4 (Week 5): Patient will complete verbal expression at the phrase level with structured tasks with Min A multimodal cues with 75% accuracy.  SLP Short Term Goal 5 (Week 5): Patient will self-monitor and correct verbal errors with Max A multimodal cues.   Skilled Therapeutic Interventions:  Session 1: Skilled treatment session focused on cognitive goals. SLP facilitated session by providing Min A verbal and question cues for functional problem solving during a basic but novel card game.  Patient was able to demonstrate auditory comprehension of rules to task with supervision question cues.  Patient left upright in wheelchair with quick release belt in place and all needs within reach. Continue with current plan of care.    Session 2: Skilled treatment session focused on functional communication and language goals. Patient participated in basic and mildly complex auditory comprehension tasks. Patient was able to identify functional pictures/items with 100% accuracy, forms with 65% accuracy, letters with 15% accuracy, numbers  with 83% accuracy, colors with 100% accuracy, furniture with 100% accuracy, body parts with 100% accuracy and right/left body identification with 42% accuracy.  Patient also followed basic commands with 100% accuracy but was unable to follow sequential commands with objects despite total A multimodal cues. Patient competed sentence completion and responsive naming tasks with 100% accuracy and named functional items with 68% accuracy and Min A phonemic and semantic cues. Patient independently requested to use the bathroom at end of session and was transferred using the [employer] and required Mod A verbal cues for attention to RUE throughout tasks. Patient left with NT. Continue with current plan of care.   FIM:  Comprehension Comprehension Mode: Auditory Comprehension: 5-Understands basic 90% of the time/requires cueing < 10% of the time Expression Expression Mode: Verbal Expression: 4-Expresses basic 75 - 89% of the time/requires cueing 10 - 24% of the time. Needs helper to occlude trach/needs to repeat words. Social Interaction Social Interaction: 6-Interacts appropriately with others with medication or extra time (anti-anxiety, antidepressant). Problem Solving Problem Solving: 4-Solves basic 75 - 89% of the time/requires cueing 10 - 24% of the time Memory Memory: 4-Recognizes or recalls 75 - 89% of the time/requires cueing 10 - 24% of the time  Pain Pain Assessment Pain Assessment: No/denies pain  Therapy/Group: Individual Therapy  Kodi Guerrera 07/05/2014, 4:10 PM

## 2014-07-06 ENCOUNTER — Inpatient Hospital Stay (HOSPITAL_COMMUNITY): Payer: BLUE CROSS/BLUE SHIELD | Admitting: Physical Therapy

## 2014-07-06 ENCOUNTER — Inpatient Hospital Stay (HOSPITAL_COMMUNITY): Payer: BLUE CROSS/BLUE SHIELD | Admitting: Speech Pathology

## 2014-07-06 ENCOUNTER — Inpatient Hospital Stay (HOSPITAL_COMMUNITY): Payer: BLUE CROSS/BLUE SHIELD

## 2014-07-06 NOTE — Progress Notes (Signed)
Nutrition Follow-up  DOCUMENTATION CODES:  Obesity unspecified  INTERVENTION:  Magic cup, Snacks   Encourage adequate PO intake.  NUTRITION DIAGNOSIS:  Inadequate oral intake related to  (decreased appetite) as evidenced by  (varied meal completion of 25-90%); improving  GOAL:  Patient will meet greater than or equal to 90% of their needs; progressing  MONITOR:  PO intake, Supplement acceptance, Weight trends, Labs, I & O's  REASON FOR ASSESSMENT:  Consult Poor PO  ASSESSMENT: Pt with history of hypertension, recent status post AAA stent grafting. Admitted 05/20/2014 after after developing severe headache right arm weakness while at work and suddenly became unresponsive. Cranial CT scan showed left ICH with 3 mm midline shift. Underwent left frontoparietal craniotomy for evacuation of intracerebral hematoma 05/20/2014.  Meal completion has been varied from 20-100%, however most meals have been >50%. Pt reports her appetite is good and she has been enjoying her food at meals. Resource Cleotis Nipper has been discontinued as she has been refusing them. Pt does report liking Magic cup. Pt was encouraged to eat her food at meals. Noted wt has been trending down. RD to monitor closely  Labs and medications reviewed.   Height:  Ht Readings from Last 1 Encounters:  05/20/14 5\' 4"  (1.626 m)    Weight:  Wt Readings from Last 1 Encounters:  06/30/14 182 lb 8.7 oz (82.8 kg)    Ideal Body Weight:  54.5 kg  Wt Readings from Last 10 Encounters:  06/30/14 182 lb 8.7 oz (82.8 kg)  06/03/14 199 lb 4.7 oz (90.4 kg)    BMI:  Body mass index is 31.32 kg/(m^2).  Estimated Nutritional Needs:  Kcal:  1750-1950  Protein:  85-95 grams  Fluid:  1.7 - 1.9 L/day  Skin:   (Incision on L head, non-piting RLE, RUE edema)  Diet Order:  Diet regular Room service appropriate?: Yes; Fluid consistency:: Thin  EDUCATION NEEDS:  No education needs identified at this time   Intake/Output  Summary (Last 24 hours) at 07/06/14 1542 Last data filed at 07/06/14 1200  Gross per 24 hour  Intake    980 ml  Output      0 ml  Net    980 ml    Last BM:  6/13  Roslyn Smiling, MS, RD, LDN Pager # [phone] After hours/ weekend pager # [phone]

## 2014-07-06 NOTE — Progress Notes (Signed)
Speech Language Pathology Daily Session Note  Patient Details  Name: Moonyeen Langell MRN: [id number] Date of Birth: 09-06-1945  Today's Date: 07/06/2014 SLP Individual Time: 1100-1200 SLP Individual Time Calculation (min): 60 min  Short Term Goals: Week 5: SLP Short Term Goal 1 (Week 5): Patient will consume current diet without overt s/s of aspiration with Mod I for use of swallow strategies.  SLP Short Term Goal 2 (Week 5): Patient will scan to the right field of enviornment with supervision verbal cues during functional and familiar tasks. SLP Short Term Goal 3 (Week 5): Patient will demonstrate selective attention to functional and familiar tasks for 60 minutes with supervision verbal cues for redirection in moderately distracting enviornment.   SLP Short Term Goal 4 (Week 5): Patient will complete verbal expression at the phrase level with structured tasks with Min A multimodal cues with 75% accuracy.  SLP Short Term Goal 5 (Week 5): Patient will self-monitor and correct verbal errors with Max A multimodal cues.   Skilled Therapeutic Interventions: Skilled treatment session focused on language goals. SLP facilitated session by providing extra time with basic and mildly complex reading comprehension tasks. Patient completed sentence completion task at the sentence level with 75% accuracy, reading commands with 66% accuracy, match a written word to an object with 100% accuracy, match written word to picture with 100% accuracy, match picture to written word with 100% accuracy, match spoken word to written word with 75% accuracy, spell words aloud with 16% accuracy, perform calculations with 50% accuracy and recognize a word that was spelled aloud with 33% accuracy. Patient also made blocked figures with 100% accuracy and extra time.  Overall, patient's overall reading comprehension has improved.  Patient left upright in wheelchair with quick release belt in place and all needs within reach.  Continue with current plan of care.    FIM:  Comprehension Comprehension Mode: Auditory Comprehension: 5-Understands basic 90% of the time/requires cueing < 10% of the time Expression Expression Mode: Verbal Expression: 4-Expresses basic 75 - 89% of the time/requires cueing 10 - 24% of the time. Needs helper to occlude trach/needs to repeat words. Social Interaction Social Interaction: 6-Interacts appropriately with others with medication or extra time (anti-anxiety, antidepressant). Problem Solving Problem Solving: 4-Solves basic 75 - 89% of the time/requires cueing 10 - 24% of the time Memory Memory: 4-Recognizes or recalls 75 - 89% of the time/requires cueing 10 - 24% of the time  Pain Pain Assessment Pain Assessment: No/denies pain  Therapy/Group: Individual Therapy  Barney Gertsch 07/06/2014, 3:01 PM

## 2014-07-06 NOTE — Progress Notes (Signed)
Occupational Therapy Session Note  Patient Details  Name: Manaia Achter MRN: [id number] Date of Birth: 1945/06/24  Today's Date: 07/06/2014 OT Individual Time: 0900-1000 OT Individual Time Calculation (min): 60 min    Short Term Goals: Week 4:  OT Short Term Goal 1 (Week 4): Focus on LTGs due expected discharge date  Skilled Therapeutic Interventions/Progress Updates:    Pt seen for 1:1 OT session with focus on functional transfers, hemi dressing technique, postural control, R NMR, and cognitive remediation. Pt received supine in bed with staff member taking lunch order. Pt verbalized lunch and dinner order at word level with 75% accuracy and mod cues. Pt completed supine>sit with min A and squat pivot transfer bed>w/c at mod A. Pt completed dressing sit<>stand at sink with min cues for hemi dressing technique and setup assist for UB dressing to position RUE only. Pt required min A sit<>stand during LB dressing. Completed squat pivot transfer w/c>toilet with mod A and required total A for hygiene and clothing management. Pt required max A squat pivot transfer toilet>w/c due to impulsivity. Engaged in RUE weight bearing activity at elevated mat with mod-max A standing balance. Pt fatigued quickly during activity, requiring multiple rest breaks. Practiced squat pivot transfer w/c<>mat table with min A initially and therapist blocking RLE only then required mod A likely due to fatigue. Pt returned to room and left sitting in w/c with all needs in reach.  Therapy Documentation Precautions:  Precautions Precautions: Fall Precaution Comments: R field cut/R inattention, pushes to R  Restrictions Weight Bearing Restrictions: No General:   Vital Signs:  Pain: No report of pain  See FIM for current functional status  Therapy/Group: Individual Therapy  Daneil Dan 07/06/2014, 12:11 PM

## 2014-07-06 NOTE — Patient Care Conference (Signed)
Inpatient RehabilitationTeam Conference and Plan of Care Update Date: 07/06/2014   Time: 3:05 PM    Patient Name: Carolyn [name]      Medical Record Number: [id number]  Date of Birth: [birth year] Sex: Female         Room/Bed: 4W11C/4W11C-01 Payor Info: Payor: BLUE CROSS BLUE SHIELD / Plan: BCBS OTHER / Product Type: *No Product type* /    Admitting Diagnosis: NON TRAUMATIC ICH  Admit Date/Time:  06/03/2014  3:45 PM Admission Comments: No comment available   Primary Diagnosis:  Nontraumatic hemorrhage of cerebral hemisphere Principal Problem: Nontraumatic hemorrhage of cerebral hemisphere  Patient Active Problem List   Diagnosis Date Noted  . Embolic stroke   . HLD (hyperlipidemia)   . Aphasia   . Right hemiplegia   . Endotracheal tube present   . Stroke   . 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 Acute respiratory failure, unspecified whether with hypoxia or hypercapnia   . Acute respiratory failure   . Bradycardia 05/23/2014  . Cerebral brain hemorrhage   . Respiratory failure   . Junctional bradycardia   . Arterial hypotension   . Nontraumatic hemorrhage of cerebral hemisphere 05/20/2014  . Hypertensive emergency 05/20/2014    Expected Discharge Date: Expected Discharge Date:  (SNF)  Team Members Present: Physician leading conference: Dr. Faith Rogue Social Worker Present: Amada Jupiter, LCSW Nurse Present: Carmie End, RN PT Present: Bayard Hugger, Varney Biles, PT OT Present: Ardis Rowan, Darolyn Rua, OT SLP Present: Feliberto Gottron, SLP PPS Coordinator present : Tora Duck, RN, CRRN     Current Status/Progress Goal Weekly Team Focus  Medical   language stable. slow return with right motor but transfers and mobility better  improve fucntioanl mobiltiy  nutrition, improve transfers   Bowel/Bladder   Pt continent of bowel and bladder. Brief worn HS due to urgancy  manage bowel and bladder Mod assist  continue POC- use toilet   Swallow/Nutrition/  Hydration   Regular textures with thin liquids, Intermittent supervision  Supervision  increase safety in order to decrease supervision to intermittent    ADL's   mod A UB dressing and bathing, max A LB dressing, total A toileting, mod-max A functional transfers  mod A UB dressing, bathing, and standing balance, max A toileting, toilet transfer and LB dressing, min A grooming  sitting and standing balance, visual scanning, R NMR, postural control, functionl transfers, safety   Mobility   S sitting balance, min-mod A bed mobility, mod-max A transfers and standing balance, max A short distance gait with +2 for wheelchair follow  min A w/c and bed mobility, mod A transfers and standing balance, max A gait with therapy only  standing balance, gait, postural control, midline orientation, RLE NMR, R attention, transfers, activity tolerance, pt/family education   Communication   Min A for comprehension and Mod A for functional communication   Min-Mod A  verbal expression at the phrase level, self-monitoring and correcting errors    Safety/Cognition/ Behavioral Observations  Min-Mod A  Min-Mod A  attention to right, problem solving, awareness    Pain   no complaints of pain  Less than 3 on scale 1 to 10.      Skin   incision to left scalp OTA approximated  To keep skin free of infection min assist  assess skin q shift    Rehab Goals Patient on target to meet rehab goals: Yes *See Care Plan and progress notes for long and short-term goals.  Barriers to Discharge: lack of  24 hour assist at home    Possible Resolutions to Barriers:  SNF placement    Discharge Planning/Teaching Needs:  d/c plan is SNF - bed search underway      Team Discussion:  Still making gains with therapies on CIR in mobility and ADLs..  Advancing her right leg consistently!  Communication with limited change this week.  SW reports family pursuing SNF  Revisions to Treatment Plan:  Change in d/c plan to SNF    Continued Need for Acute Rehabilitation Level of Care: The patient requires daily medical management by a physician with specialized training in physical medicine and rehabilitation for the following conditions: Daily direction of a multidisciplinary physical rehabilitation program to ensure safe treatment while eliciting the highest outcome that is of practical value to the patient.: Yes Daily medical management of patient stability for increased activity during participation in an intensive rehabilitation regime.: Yes Daily analysis of laboratory values and/or radiology reports with any subsequent need for medication adjustment of medical intervention for : Neurological problems;Post surgical problems  Carolyn [name] 07/06/2014, 5:34 PM

## 2014-07-06 NOTE — Progress Notes (Signed)
Physical Therapy Session Note  Patient Details  Name: Carolyn [name] MRN: [id number] Date of Birth: 1945-06-04  Today's Date: 07/06/2014 PT Individual Time: 1300-1415 PT Individual Time Calculation (min): 75 min   Short Term Goals: Week 5:  PT Short Term Goal 1 (Week 5): = LTGs due to anticipated LOS  Skilled Therapeutic Interventions/Progress Updates:   Session focused on functional transfers, bed mobility, RLE NMR, ambulation, stair training, midline orientation, upright posture, and safety awareness.   Lateral scoot transfer from wheelchair > mat to R and from mat > wheelchair to L with min A overall, improved sequencing and technique and assist for safe placement RLE. Stand pivot transfer to R using [facility] with mod A, assist for positioning RLE. Sit <> stand transfers with min A and verbal cues for pushing up with LUE. Patient transferred sit <> supine on mat table with min A, max verbal cues for sequencing and technique.   Supine RLE NMR: Hooklying R adduction x 10 with assist to stabilize R foot. Bridging x 20 with approximation through right knee and facilitation at pelvis for glute activation. PNF D2 flexion/extension with patient able to initiate movement of RLE with max multimodal cues for sequencing.   Gait using WBQC and R dorsiflexion wrap assist x 19 ft + 20 ft with max assist for upright posture, safe placement and stabilization of RLE, +2 A for wheelchair follow.   Stair training up/down two 3" steps using R rail, max multimodal cues for safe sequencing and total A x 1 for placement and stabilization of RLE. Patient ascended forwards leading LLE and descended backwards leading RLE. Patient performed step taps using LLE to 3" step with max-total assist and LUE support on rail, total multimodal cues for safe sequencing.   Patient kicked beach ball with RLE while seated on raised mat progressed to standing with max A and kicking beach ball with RLE, facilitation for weight shift  to L for improved foot clearance and R DF wrap assist donned.   Patient reported need for bathroom, performed squat pivot transfer wheelchair <> commode with mod A and +2 to stabilize wheelchair. Performed clothing management with sit <> stand +2A for safety and patient performed hygiene with supervision. Patient left sitting in wheelchair with quick release belt and 1/2 lap tray donned with all needs within reach.   Therapy Documentation Precautions:  Precautions Precautions: Fall Precaution Comments: R field cut/R inattention, pushes to R  Restrictions Weight Bearing Restrictions: No Pain: Pain Assessment Pain Assessment: No/denies pain Locomotion : Ambulation Ambulation/Gait Assistance: 1: +2 Total assist   See FIM for current functional status  Therapy/Group: Individual Therapy  Kerney Elbe 07/06/2014, 2:20 PM

## 2014-07-06 NOTE — Progress Notes (Signed)
Subjective/Complaints: Rested well. No overt problems or complaints this morning. In good spirits. ROS limited by language  Objective: Vital Signs: Blood pressure 134/57, pulse 75, temperature 98.5 F (36.9 C), temperature source Oral, resp. rate 18, weight 82.8 kg (182 lb 8.7 oz), SpO2 95 %. No results found. Results for orders placed or performed during the hospital encounter of 06/03/14 (from the past 72 hour(s))  CBC     Status: Abnormal   Collection Time: 07/04/14  7:03 AM  Result Value Ref Range   WBC 7.8 4.0 - 10.5 K/uL   RBC 4.18 3.87 - 5.11 MIL/uL   Hemoglobin 11.2 (L) 12.0 - 15.0 g/dL   HCT 36.0 36.0 - 46.0 %   MCV 86.1 78.0 - 100.0 fL   MCH 26.8 26.0 - 34.0 pg   MCHC 31.1 30.0 - 36.0 g/dL   RDW 15.1 11.5 - 15.5 %   Platelets 301 150 - 400 K/uL  Basic metabolic panel     Status: Abnormal   Collection Time: 07/04/14  7:03 AM  Result Value Ref Range   Sodium 140 135 - 145 mmol/L   Potassium 3.7 3.5 - 5.1 mmol/L   Chloride 100 (L) 101 - 111 mmol/L   CO2 28 22 - 32 mmol/L   Glucose, Bld 104 (H) 65 - 99 mg/dL   BUN 9 6 - 20 mg/dL   Creatinine, Ser 0.71 0.44 - 1.00 mg/dL   Calcium 9.3 8.9 - 10.3 mg/dL   GFR calc non Af Amer >60 >60 mL/min   GFR calc Af Amer >60 >60 mL/min    Comment: (NOTE) The eGFR has been calculated using the CKD EPI equation. This calculation has not been validated in all clinical situations. eGFR's persistently <60 mL/min signify possible Chronic Kidney Disease.    Anion gap 12 5 - 15  Urinalysis, Routine w reflex microscopic (not at [facility])     Status: Abnormal   Collection Time: 07/04/14  8:30 PM  Result Value Ref Range   Color, Urine YELLOW YELLOW   APPearance CLOUDY (A) CLEAR   Specific Gravity, Urine 1.012 1.005 - 1.030   pH 6.5 5.0 - 8.0   Glucose, UA NEGATIVE NEGATIVE mg/dL   Hgb urine dipstick NEGATIVE NEGATIVE   Bilirubin Urine NEGATIVE NEGATIVE   Ketones, ur NEGATIVE NEGATIVE mg/dL   Protein, ur NEGATIVE NEGATIVE mg/dL   Urobilinogen, UA 1.0 0.0 - 1.0 mg/dL   Nitrite POSITIVE (A) NEGATIVE   Leukocytes, UA LARGE (A) NEGATIVE  Culture, Urine     Status: None (Preliminary result)   Collection Time: 07/04/14  8:30 PM  Result Value Ref Range   Specimen Description URINE, CLEAN CATCH    Special Requests NONE    Colony Count      >=100,000 COLONIES/ML Performed at [place] Performed at [facility]    Report Status PENDING   Urine microscopic-add on     Status: Abnormal   Collection Time: 07/04/14  8:30 PM  Result Value Ref Range   Squamous Epithelial / LPF FEW (A) RARE   WBC, UA 21-50 <3 WBC/hpf   RBC / HPF 3-6 <3 RBC/hpf   Bacteria, UA FEW (A) RARE   Casts HYALINE CASTS (A) NEGATIVE     HEENT: left scalp wound healing nicely Cardio: RRR and no murmurs Resp: CTA B/L   GI: BS positive and nontender, nondistended Extremity:  Pulses positive and No Edema Skin:  Skin intact Neuro: Confused, Abnormal Sensory reduced sensation right upper extremity, left-sided strength is 4/5 in the upper and lower ex prox to distal. 0/5 right upper extremity, 0/5 left lower extremity. Continued exp aphasia---able to communicate in short sentences and phrases for the most part Musc/Skel:  Other no pain with upper limb or lower limb range of motion Gen. no acute distress   Assessment/Plan: 1. Functional deficits secondary to left intracranial hemorrhage status post evacuation 05/20/2014 with right hemiplegia and aphasia which require 3+ hours per day of interdisciplinary therapy in a comprehensive inpatient rehab setting. Physiatrist is providing close team supervision and 24 hour management of active medical problems listed below. Physiatrist and rehab team continue to assess barriers to discharge/monitor patient progress toward functional and medical goals.   FIM: FIM - Bathing Bathing Steps Patient Completed: Chest, Right Arm, Abdomen, Right upper leg, Left  upper leg, Front perineal area Bathing: 3: Mod-Patient completes 5-7 13f10 parts or 50-74%  FIM - Upper Body Dressing/Undressing Upper body dressing/undressing steps patient completed: Thread/unthread left bra strap, Thread/unthread left sleeve of pullover shirt/dress, Put head through opening of pull over shirt/dress, Thread/unthread right bra strap Upper body dressing/undressing: 3: Mod-Patient completed 50-74% of tasks FIM - Lower Body Dressing/Undressing Lower body dressing/undressing steps patient completed: Thread/unthread left pants leg, Thread/unthread left underwear leg, Don/Doff left shoe Lower body dressing/undressing: 2: Max-Patient completed 25-49% of tasks  FIM - Toileting Toileting steps completed by patient: Performs perineal hygiene Toileting Assistive Devices: Grab bar or rail for support Toileting: 1: Two helpers  FIM - TRadio producerDevices: BRecruitment consultantTransfers: 2-To toilet/BSC: Max A (lift and lower assist), 1-Two helpers  FIM - BControl and instrumentation engineerDevices: Arm rests Bed/Chair Transfer: 4: Supine > Sit: Min A (steadying Pt. > 75%/lift 1 leg)  FIM - Locomotion: Wheelchair Distance: 5 ft Locomotion: Wheelchair: 1: Total Assistance/staff pushes wheelchair (Pt<25%) FIM - Locomotion: Ambulation Locomotion: Ambulation Assistive Devices: CNurse, adultAmbulation/Gait Assistance: 1: +2 Total assist Locomotion: Ambulation: 0: Activity did not occur  Comprehension Comprehension Mode: Auditory Comprehension: 5-Understands basic 90% of the time/requires cueing < 10% of the time  Expression Expression Mode: Verbal Expression: 4-Expresses basic 75 - 89% of the time/requires cueing 10 - 24% of the time. Needs helper to occlude trach/needs to repeat words.  Social Interaction Social Interaction: 6-Interacts appropriately with others with medication or extra time (anti-anxiety, antidepressant).  Problem  Solving Problem Solving: 4-Solves basic 75 - 89% of the time/requires cueing 10 - 24% of the time  Memory Memory: 4-Recognizes or recalls 75 - 89% of the time/requires cueing 10 - 24% of the time   Medical Problem List and Plan: 1. 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 DVT Prophylaxis/Anticoagulation: Subcutaneous heparin initiated 05/24/2014. Monitor for any bleeding episodes 3. Pain Management: Tylenol generally effective 4. Seizure prophylaxis. Keppra 500 mg twice a day. EEG negative 5. Neuropsych: This patient is still not capable of making decisions on her own behalf. 6. Skin/Wound Care: Routine skin checks and pressure relief 7. Fluids/Electrolytes/Nutrition:labs reviewed and normal. Intake adequate to good 8. Dysphagia. Dysphagia #3 thin liquids.   9. MRSA PCR screening positive. Contact precautions 10. Hypertension. Continue Toprol-XL 25 mg daily. Normotensive    11. Hypothyroidism. Synthroid  LOS (Days) 33 A FACE TO FACE EVALUATION WAS PERFORMED  Karolynn Infantino T 07/06/2014, 8:41 AM

## 2014-07-07 ENCOUNTER — Inpatient Hospital Stay (HOSPITAL_COMMUNITY): Payer: BLUE CROSS/BLUE SHIELD

## 2014-07-07 ENCOUNTER — Inpatient Hospital Stay (HOSPITAL_COMMUNITY): Payer: BLUE CROSS/BLUE SHIELD | Admitting: Physical Therapy

## 2014-07-07 ENCOUNTER — Inpatient Hospital Stay (HOSPITAL_COMMUNITY): Payer: BLUE CROSS/BLUE SHIELD | Admitting: Speech Pathology

## 2014-07-07 MED ORDER — AMOXICILLIN 250 MG PO CAPS
250.0000 mg | ORAL_CAPSULE | Freq: Three times a day (TID) | ORAL | Status: DC
  Administered 2014-07-07 – 2014-07-08 (×3): 250 mg via ORAL
  Filled 2014-07-07 (×9): qty 1

## 2014-07-07 NOTE — Progress Notes (Signed)
Subjective/Complaints: Up awake in bed. No complaints. Denies pain, sob, cough, nvd, fever, skin issues, dysuria reported by nurse ROS limited by language  Objective: Vital Signs: Blood pressure 130/52, pulse 74, temperature 97.9 F (36.6 C), temperature source Oral, resp. rate 17, weight 81.3 kg (179 lb 3.7 oz), SpO2 97 %. No results found. Results for orders placed or performed during the hospital encounter of 06/03/14 (from the past 72 hour(s))  Urinalysis, Routine w reflex microscopic (not at [facility])     Status: Abnormal   Collection Time: 07/04/14  8:30 PM  Result Value Ref Range   Color, Urine YELLOW YELLOW   APPearance CLOUDY (A) CLEAR   Specific Gravity, Urine 1.012 1.005 - 1.030   pH 6.5 5.0 - 8.0   Glucose, UA NEGATIVE NEGATIVE mg/dL   Hgb urine dipstick NEGATIVE NEGATIVE   Bilirubin Urine NEGATIVE NEGATIVE   Ketones, ur NEGATIVE NEGATIVE mg/dL   Protein, ur NEGATIVE NEGATIVE mg/dL   Urobilinogen, UA 1.0 0.0 - 1.0 mg/dL   Nitrite POSITIVE (A) NEGATIVE   Leukocytes, UA LARGE (A) NEGATIVE  Culture, Urine     Status: None (Preliminary result)   Collection Time: 07/04/14  8:30 PM  Result Value Ref Range   Specimen Description URINE, CLEAN CATCH    Special Requests NONE    Colony Count      >=100,000 COLONIES/ML Performed at [facility]      GRAM NEGATIVE RODS Performed at [facility]    Report Status PENDING   Urine microscopic-add on     Status: Abnormal   Collection Time: 07/04/14  8:30 PM  Result Value Ref Range   Squamous Epithelial / LPF FEW (A) RARE   WBC, UA 21-50 <3 WBC/hpf   RBC / HPF 3-6 <3 RBC/hpf   Bacteria, UA FEW (A) RARE   Casts HYALINE CASTS (A) NEGATIVE     HEENT: left scalp wound healing nicely Cardio: RRR and no murmurs Resp: CTA B/L   GI: BS positive and nontender, nondistended Extremity:  Pulses positive and No Edema Skin:   Skin intact Neuro: Confused, Abnormal Sensory reduced sensation right upper  extremity, left-sided strength is 4/5 in the upper and lower ex prox to distal. 0/5 right upper extremity, 2/5 left HF, KE and tr to 0/5 ankle. Continued exp aphasia---able to communicate in short sentences and phrases for the most part--no change Musc/Skel:  Full PROM, no pain Gen. no acute distress. Very alert   Assessment/Plan: 1. Functional deficits secondary to left intracranial hemorrhage status post evacuation 05/20/2014 with right hemiplegia and aphasia which require 3+ hours per day of interdisciplinary therapy in a comprehensive inpatient rehab setting. Physiatrist is providing close team supervision and 24 hour management of active medical problems listed below. Physiatrist and rehab team continue to assess barriers to discharge/monitor patient progress toward functional and medical goals.   FIM: FIM - Bathing Bathing Steps Patient Completed: Chest, Right Arm, Abdomen, Right upper leg, Left upper leg, Front perineal area Bathing: 3: Mod-Patient completes 5-7 71f 10 parts or 50-74%  FIM - Upper Body Dressing/Undressing Upper body dressing/undressing steps patient completed: Thread/unthread left bra strap, Thread/unthread left sleeve of pullover shirt/dress, Put head through opening of pull over shirt/dress, Thread/unthread right bra strap, Thread/unthread right sleeve of pullover shirt/dresss, Pull shirt over trunk Upper body dressing/undressing: 4: Min-Patient completed 75 plus % of tasks FIM - Lower Body Dressing/Undressing Lower body dressing/undressing steps patient completed: Thread/unthread left pants leg, Don/Doff left shoe  Lower body dressing/undressing: 2: Max-Patient completed 25-49% of tasks  FIM - Toileting Toileting steps completed by patient: Performs perineal hygiene Toileting Assistive Devices: Grab bar or rail for support Toileting: 2: Max-Patient completed 1 of 3 steps  FIM - Diplomatic Services operational officer Devices: Psychiatrist  Transfers: 1-Two helpers, 1-Mechanical lift  FIM - Banker Devices: Arm rests Bed/Chair Transfer: 4: Supine > Sit: Min A (steadying Pt. > 75%/lift 1 leg), 4: Sit > Supine: Min A (steadying pt. > 75%/lift 1 leg), 4: Bed > Chair or W/C: Min A (steadying Pt. > 75%), 4: Chair or W/C > Bed: Min A (steadying Pt. > 75%)  FIM - Locomotion: Wheelchair Distance: 5 ft Locomotion: Wheelchair: 4: Travels 150 ft or more: maneuvers on rugs and over door sillls with minimal assistance (Pt.>75%) FIM - Locomotion: Ambulation Locomotion: Ambulation Assistive Devices: Occupational hygienist Ambulation/Gait Assistance: 1: +2 Total assist Locomotion: Ambulation: 1: Two helpers  Comprehension Comprehension Mode: Auditory Comprehension: 5-Follows basic conversation/direction: With extra time/assistive device  Expression Expression Mode: Verbal Expression: 3-Expresses basic 50 - 74% of the time/requires cueing 25 - 50% of the time. Needs to repeat parts of sentences.  Social Interaction Social Interaction: 5-Interacts appropriately 90% of the time - Needs monitoring or encouragement for participation or interaction.  Problem Solving Problem Solving: 4-Solves basic 75 - 89% of the time/requires cueing 10 - 24% of the time  Memory Memory: 4-Recognizes or recalls 75 - 89% of the time/requires cueing 10 - 24% of the time   Medical Problem List and Plan: 1. 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 DVT Prophylaxis/Anticoagulation: Subcutaneous heparin initiated 05/24/2014. Monitor for any bleeding episodes 3. Pain Management: Tylenol generally effective 4. Seizure prophylaxis. Keppra 500 mg twice a day. EEG negative 5. Neuropsych: This patient is still not capable of making decisions on her own behalf. 6. Skin/Wound Care: Routine skin checks and pressure relief 7. Fluids/Electrolytes/Nutrition:labs reviewed and normal.  Intake adequate to good 8. Dysphagia. Dysphagia #3 thin liquids.   9. MRSA PCR screening positive. Contact precautions 10. Hypertension. Continue Toprol-XL 25 mg daily. Normotensive    11. Hypothyroidism. Synthroid 12. UTI--100k GNR--empiric amoxil started today  LOS (Days) 34 A FACE TO FACE EVALUATION WAS PERFORMED  Jessikah Dicker T 07/07/2014, 8:27 AM

## 2014-07-07 NOTE — Progress Notes (Signed)
Speech Language Pathology Daily Session Note  Patient Details  Name: Carolyn [name] MRN: [id number] Date of Birth: Jun 14, 1945  Today's Date: 07/07/2014 SLP Individual Time: 1400-1500 SLP Individual Time Calculation (min): 60 min  Short Term Goals: Week 5: SLP Short Term Goal 1 (Week 5): Patient will consume current diet without overt s/s of aspiration with Mod I for use of swallow strategies.  SLP Short Term Goal 2 (Week 5): Patient will scan to the right field of enviornment with supervision verbal cues during functional and familiar tasks. SLP Short Term Goal 3 (Week 5): Patient will demonstrate selective attention to functional and familiar tasks for 60 minutes with supervision verbal cues for redirection in moderately distracting enviornment.   SLP Short Term Goal 4 (Week 5): Patient will complete verbal expression at the phrase level with structured tasks with Min A multimodal cues with 75% accuracy.  SLP Short Term Goal 5 (Week 5): Patient will self-monitor and correct verbal errors with Max A multimodal cues.   Skilled Therapeutic Interventions: Skilled treatment session focused on cognitive-linguistic goals. SLP facilitated session by providing Min A verbal and question cues for letter identification task with 83% accuracy and Max-Total A to verbally name letters and spell basic words when within a specific category. Patient demonstrated increased difficulty with verbal expression today but demonstrated emergent awareness of errors and reported, "I don't know why I can't do anything today." Patient independently requested to use the bathroom and was transferred to the toilet with the [facility] and required Min A verbal cues for safety with task. Patient left with NT. Continue with current plan of care.    FIM:  Comprehension Comprehension Mode: Auditory Comprehension: 5-Follows basic conversation/direction: With extra time/assistive device Expression Expression Mode: Verbal Expression:  3-Expresses basic 50 - 74% of the time/requires cueing 25 - 50% of the time. Needs to repeat parts of sentences. Social Interaction Social Interaction: 5-Interacts appropriately 90% of the time - Needs monitoring or encouragement for participation or interaction. Problem Solving Problem Solving: 4-Solves basic 75 - 89% of the time/requires cueing 10 - 24% of the time Memory Memory: 4-Recognizes or recalls 75 - 89% of the time/requires cueing 10 - 24% of the time  Pain Pain Assessment Pain Assessment: No/denies pain  Therapy/Group: Individual Therapy  Takenya Travaglini 07/07/2014, 5:35 PM

## 2014-07-07 NOTE — Progress Notes (Addendum)
Physical Therapy Session Note  Patient Details  Name: Carolyn [name] MRN: [id number] Date of Birth: [birth year]  Today's Date: 07/07/2014 PT Individual Time: 1300-1330 and 1500-1555 PT Individual Time Calculation (min): 30 min and 55 min   Short Term Goals: Week 5:  PT Short Term Goal 1 (Week 5): = LTGs due to anticipated LOS  Skilled Therapeutic Interventions/Progress Updates:   Session 1: Focus on sit <> stand transfers, postural control, midline, weight shifting, standing balance, and activity tolerance. Patient performed level surface lateral scoot transfers wheelchair <> mat table with min A, cues for managing arm rests and assist for leg rest management. Patient transferred sit <> stand from mat table with min A, therapist grading amount of stabilization at RLE with cues for R knee extension in stance. In standing, performed reaching task slightly outside BOS forward and to L to facilitate weight shifting with mod A overall for standing balance and facilitation of weight shift to match cards on back of mirror. Patient fatigued quickly with increased fear of falling during task, requiring mod encouragement to complete task. Patient returned to wheelchair as above and left in wheelchair to return to room with rehab tech.     Session 2: Focus on functional transfers, RLE NMR, ambulation, and wheelchair mobility.   Patient performed lateral scoot transfers with min A overall with therapist stabilizing wheelchair. Patient transferred sit <> supine on mat table with min-mod A overall and max cues for sequencing and technique with no carryover noted from previous sessions.   Supine RLE NMR: Hooklying R adduction x 10, bridging 2 x 10, active assisted heel slides 2 x 10 with mod multimodal cues for sequencing.   RLE PROM: hamstrings, heel cords, hip ER, hip IR with 60 sec hold for each stretch.  Gait using WBQC and R dorsiflexion wrap assist 2 x < 20 ft with max-total assist for upright  posture, safe placement and stabilization of RLE, +2 A for wheelchair follow. Patient with increased pushing forward and to R with difficulty sequencing ambulation and advancement of quad cane this session despite max multimodal cues and demonstration.  Patient propelled wheelchair using L hemi technique back to room with supervision overall and greatly increased time, max multimodal cues for sequencing.   Patient returned to bed at end of session and left semi reclined with RUE elevated and RN present.   Therapy Documentation Precautions:  Precautions Precautions: Fall Precaution Comments: R field cut/R inattention, pushes to R  Restrictions Weight Bearing Restrictions: No Pain: Pain Assessment Pain Assessment: No/denies pain Locomotion : Ambulation Ambulation/Gait Assistance: 1: +2 Total assist   See FIM for current functional status  Therapy/Group: Individual Therapy  Kerney Elbe 07/07/2014, 1:28 PM

## 2014-07-07 NOTE — Progress Notes (Signed)
Occupational Therapy Session Note  Patient Details  Name: Carolyn [name] MRN: [id number] Date of Birth: 12/15/1945  Today's Date: 07/07/2014 OT Individual Time: 0800-0900 OT Individual Time Calculation (min): 60 min    Short Term Goals: Week 4:  OT Short Term Goal 1 (Week 4): Focus on LTGs due expected discharge date  Skilled Therapeutic Interventions/Progress Updates:    Pt seen for ADL retraining with focus on functional transfers, standing balance, postural control, safety awareness, and cognitive remediation. Pt received sitting on toilet with stedy placed in front and NT present. Pt completed hygiene herself then completed sit<>stand with min A in stedy. Transferred to rolling shower chair with use of stedy for time purposes. Pt completed bathing at shower level initiating washing RUE with assist for positioning. Pt required min cues for sequencing during activity and demonstrated improved postural control during anterior weight shift for washing feet. Completed sit<>stand from rolling shower chair with min A as another therapist positioned w/c. Pt required min cues for sequencing during hemi dressing technique and min A sit<>stand during LB clothing management. Pt stood at washing machine with min-mod A while completing laundry task. Pt transferred back to room and practiced sit<>stand 4x with focus on controlled decent and hand placement. Pt left sitting in w/c with all needs in reach.  Therapy Documentation Precautions:  Precautions Precautions: Fall Precaution Comments: R field cut/R inattention, pushes to R  Restrictions Weight Bearing Restrictions: No General:   Vital Signs:  Pain: No report of pain  See FIM for current functional status  Therapy/Group: Individual Therapy  Daneil Dan 07/07/2014, 12:10 PM

## 2014-07-07 NOTE — Progress Notes (Signed)
Social Work Patient ID: Carolyn [name], female   DOB: 12/24/1945, 69 y.o.   MRN: [id number]   Have reviewed team conference with pt and daughter today.  Continue to pursue SNF bed and will keep team posted.  Macari Zalesky, LCSW

## 2014-07-08 ENCOUNTER — Inpatient Hospital Stay (HOSPITAL_COMMUNITY): Payer: BLUE CROSS/BLUE SHIELD

## 2014-07-08 ENCOUNTER — Inpatient Hospital Stay (HOSPITAL_COMMUNITY): Payer: BLUE CROSS/BLUE SHIELD | Admitting: Physical Therapy

## 2014-07-08 ENCOUNTER — Inpatient Hospital Stay (HOSPITAL_COMMUNITY): Payer: BLUE CROSS/BLUE SHIELD | Admitting: Speech Pathology

## 2014-07-08 LAB — URINE CULTURE: Colony Count: >=100000

## 2014-07-08 MED ORDER — SULFAMETHOXAZOLE-TRIMETHOPRIM 400-80 MG PO TABS
1.0000 | ORAL_TABLET | Freq: Two times a day (BID) | ORAL | Status: DC
  Administered 2014-07-08 – 2014-07-14 (×13): 1 via ORAL
  Filled 2014-07-08 (×14): qty 1

## 2014-07-08 NOTE — Progress Notes (Signed)
Subjective/Complaints: Up awake in bed. No complaints. Denies pain, sob, cough, nvd, fever, skin issues, dysuria reported by nurse ROS limited by language  Objective: Vital Signs: Blood pressure 126/61, pulse 71, temperature 98.4 F (36.9 C), temperature source Oral, resp. rate 18, weight 81.3 kg (179 lb 3.7 oz), SpO2 98 %. No results found. No results found for this or any previous visit (from the past 72 hour(s)).   HEENT: left scalp wound healing nicely Cardio: RRR and no murmurs Resp: CTA B/L   GI: BS positive and nontender, nondistended Extremity:  Pulses positive and No Edema Skin:   Skin intact Neuro: Confused, Abnormal Sensory reduced sensation right upper extremity, left-sided strength is 4/5 in the upper and lower ex prox to distal. 0/5 right upper extremity, 2/5 left HF, KE and tr to 0/5 ankle. Continued exp aphasia---able to communicate in short sentences and phrases for the most part--no change Musc/Skel:  Full PROM, no pain Gen. no acute distress. Very alert   Assessment/Plan: 1. Functional deficits secondary to left intracranial hemorrhage status post evacuation 05/20/2014 with right hemiplegia and aphasia which require 3+ hours per day of interdisciplinary therapy in a comprehensive inpatient rehab setting. Physiatrist is providing close team supervision and 24 hour management of active medical problems listed below. Physiatrist and rehab team continue to assess barriers to discharge/monitor patient progress toward functional and medical goals.   FIM: FIM - Bathing Bathing Steps Patient Completed: Chest, Right Arm, Abdomen, Right upper leg, Left upper leg, Front perineal area, Right lower leg (including foot) Bathing: 3: Mod-Patient completes 5-7 64f 10 parts or 50-74%  FIM - Upper Body Dressing/Undressing Upper body dressing/undressing steps patient completed: Thread/unthread left bra strap, Thread/unthread left sleeve of pullover shirt/dress, Put head through  opening of pull over shirt/dress, Thread/unthread right bra strap, Thread/unthread right sleeve of pullover shirt/dresss Upper body dressing/undressing: 3: Mod-Patient completed 50-74% of tasks FIM - Lower Body Dressing/Undressing Lower body dressing/undressing steps patient completed: Thread/unthread left pants leg, Don/Doff left shoe, Thread/unthread left underwear leg Lower body dressing/undressing: 2: Max-Patient completed 25-49% of tasks  FIM - Toileting Toileting steps completed by patient: Performs perineal hygiene Toileting Assistive Devices: Grab bar or rail for support Toileting: 2: Max-Patient completed 1 of 3 steps  FIM - Diplomatic Services operational officer Devices: Psychiatrist Transfers: 1-Mechanical lift  FIM - Banker Devices: Arm rests Bed/Chair Transfer: 4: Bed > Chair or W/C: Min A (steadying Pt. > 75%), 3: Chair or W/C > Bed: Mod A (lift or lower assist)  FIM - Locomotion: Wheelchair Distance: 5 ft Locomotion: Wheelchair: 1: Total Assistance/staff pushes wheelchair (Pt<25%) FIM - Locomotion: Ambulation Locomotion: Ambulation Assistive Devices: Occupational hygienist Ambulation/Gait Assistance: 1: +2 Total assist Locomotion: Ambulation: 1: Two helpers  Comprehension Comprehension Mode: Auditory Comprehension: 5-Understands complex 90% of the time/Cues < 10% of the time  Expression Expression Mode: Verbal Expression: 2-Expresses basic 25 - 49% of the time/requires cueing 50 - 75% of the time. Uses single words/gestures.  Social Interaction Social Interaction: 5-Interacts appropriately 90% of the time - Needs monitoring or encouragement for participation or interaction.  Problem Solving Problem Solving: 3-Solves basic 50 - 74% of the time/requires cueing 25 - 49% of the time  Memory Memory: 4-Recognizes or recalls 75 - 89% of the time/requires cueing 10 - 24% of the time   Medical Problem List and Plan: 1.  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 DVT Prophylaxis/Anticoagulation: Subcutaneous heparin  initiated 05/24/2014. No bleeding sequelae 3. Pain Management: Tylenol   effective 4. Seizure prophylaxis. Keppra 500 mg twice a day. EEG negative 5. Neuropsych: This patient remains incapable of making decisions on her own behalf. 6. Skin/Wound Care: Routine skin checks and pressure relief 7. Fluids/Electrolytes/Nutrition:labs reviewed and normal. Intake adequate to good 8. Dysphagia. Dysphagia #3 thin liquids.   9. MRSA PCR screening positive. Contact precautions 10. Hypertension. Continue Toprol-XL 25 mg daily. Normotensive    11. Hypothyroidism. Synthroid 12. UTI--100k GNR--citrobacter---change to septra today  LOS (Days) 35 A FACE TO FACE EVALUATION WAS PERFORMED  SWARTZ,ZACHARY T 07/08/2014, 12:15 PM

## 2014-07-08 NOTE — Progress Notes (Signed)
Speech Language Pathology Daily Session Note  Patient Details  Name: Carolyn [name] MRN: [id number] Date of Birth: 1945-06-08  Today's Date: 07/08/2014 SLP Individual Time: 1510-1540 SLP Individual Time Calculation (min): 30 min  Short Term Goals: Week 5: SLP Short Term Goal 1 (Week 5): Patient will consume current diet without overt s/s of aspiration with Mod I for use of swallow strategies.  SLP Short Term Goal 2 (Week 5): Patient will scan to the right field of enviornment with supervision verbal cues during functional and familiar tasks. SLP Short Term Goal 3 (Week 5): Patient will demonstrate selective attention to functional and familiar tasks for 60 minutes with supervision verbal cues for redirection in moderately distracting enviornment.   SLP Short Term Goal 4 (Week 5): Patient will complete verbal expression at the phrase level with structured tasks with Min A multimodal cues with 75% accuracy.  SLP Short Term Goal 5 (Week 5): Patient will self-monitor and correct verbal errors with Max A multimodal cues.   Skilled Therapeutic Interventions: Skilled treatment session focused on cognitive-linguistic goals. SLP facilitated session by initially providing Total A for problem solving and auditory comprehension of a new learning card task, however, patient required Mod A for problem solving by end of session. Patient demonstrated increased spontaneous verbal expression at the phrase level with extra time. Patient left in wheelchair with quick release belt in place and all needs within reach. Continue with current plan of care.    FIM:  Comprehension Comprehension Mode: Auditory Comprehension: 5-Understands complex 90% of the time/Cues < 10% of the time Expression Expression Mode: Verbal Expression: 3-Expresses basic 50 - 74% of the time/requires cueing 25 - 50% of the time. Needs to repeat parts of sentences. Social Interaction Social Interaction: 5-Interacts appropriately 90% of the  time - Needs monitoring or encouragement for participation or interaction. Problem Solving Problem Solving: 3-Solves basic 50 - 74% of the time/requires cueing 25 - 49% of the time Memory Memory: 4-Recognizes or recalls 75 - 89% of the time/requires cueing 10 - 24% of the time  Pain Pain Assessment Pain Assessment: No/denies pain  Therapy/Group: Individual Therapy  Rebeka Kimble 07/08/2014, 4:27 PM

## 2014-07-08 NOTE — Progress Notes (Signed)
Physical Therapy Session Note  Patient Details  Name: Carolyn [name] MRN: [id number] Date of Birth: Apr 13, 1945  Today's Date: 07/08/2014 PT Individual Time: 1300-1330 PT Individual Time Calculation (min): 30 min   Short Term Goals: Week 6:  PT Short Term Goal 1 (Week 6): = LTGs due to anticipated LOS  Skilled Therapeutic Interventions/Progress Updates:   Patient performed lateral scoot transfers from wheelchair <> mat table with min A overall and assist for wheelchair setup/stabilization.   Seated edge of mat, patient participated in game of Wii bowling using LUE with focus on timing, coordination, sequencing, and command following. Patient required initial HOH assist progressed to mod multimodal cues to complete task.   Patient left sitting in wheelchair with 1/2 lap tray donned and all needs within reach.   Therapy Documentation Precautions:  Precautions Precautions: Fall Precaution Comments: R field cut/R inattention, pushes to R  Restrictions Weight Bearing Restrictions: No Pain: Pain Assessment Pain Assessment: No/denies pain  See FIM for current functional status  Therapy/Group: Individual Therapy  Kerney Elbe 07/08/2014, 1:27 PM

## 2014-07-08 NOTE — Progress Notes (Signed)
Physical Therapy Weekly Progress Note  Patient Details  Name: Carolyn [name] MRN: [id number] Date of Birth: [birth year]  Beginning of progress report period: July 01, 2014 End of progress report period: July 08, 2014  Today's Date: 07/08/2014 PT Individual Time: 0950-1050 PT Individual Time Calculation (min): 60 min   Patient has met 5 of 9 long term goals. Patient continues to make slow steady progress with improved muscle activation in RLE as noted by ability to advance RLE during swing phase of gait with assist for safe placement/stabilization during stance phase. Patient has also demonstrated improvement in squat pivot transfers with min-mod A and decreased cuing needed. Patient requires min-mod A for bed mobility with mod-max cues for sequencing, S for sitting balance with supervision, mod A standing balance, wheelchair mobility with S-min A, and is consistently ambulating about 20 ft with progression to [facility] with max A x 1 and +2 for wheelchair follow for safety.  Patient continues to demonstrate the following deficits: RUE/RLE hemiplegia, muscle weakness, decreased standing balance, decreased balance reactions, pushing tendencies to R, decreased postural control, decreased endurance, impaired timing and sequencing, abnormal tone, unbalanced muscle activation, motor apraxia, decreased coordination, decreased motor planning, decreased sensation, decreased midline orientation, decreased attention to right, aphasia, decreased initiation, decreased attention, decreased awareness, decreased problem solving, decreased safety awareness, decreased memory, and delayed processing and therefore will continue to benefit from skilled PT intervention to enhance overall performance with activity tolerance, balance, postural control, ability to compensate for deficits, functional use of  right upper extremity and right lower extremity, attention, awareness and coordination.  See Patient's Care Plan for  progression toward long term goals.  Patient progressing toward long term goals.  Continue plan of care.  Skilled Therapeutic Interventions/Progress Updates:   Focus on functional mobility, RLE NMR, ambulation, balance and balance reactions, and activity tolerance. Patient semi reclined in bed, requesting to use bathroom. Patient transferred supine > sit with HOB flat and max cues for sequencing with min A overall. Patient performed sit <> stand from bed and to and from commode via [place] with one person assist, min guard overall. Patient performed sit <> stand from wheelchair to pull up pants with max A for standing balance. Using LUE support on rail in hallway, performed RLE step ups to 4" step with mod-max A to fatigue, mirror in front for visual feedback. Gait using WBQC and R dorsiflexion wrap assist x 20 ft with max assist for upright posture, safe placement and stabilization of RLE and +2 for wheelchair follow for safety. Patient demonstrated improved sequencing with decreased pushing compared to yesterday. Patient instructed in ambulating using [facility] around cone to return to wheelchair to practice turning. Patient with increased anxiety and fear and unsafe impulsive behavior in standing resulting in prolonged seated rest break before patient able to attempt activity again. Patient ambulated around cone using [facility] with max A and total cues for sequencing x 10 ft. Performed lateral scoot transfers from wheelchair to and from mat table with min A overall. Worked on sitting balance, core strengthening, and balance reactions on raised mat with BLE unsupported while passing beach ball back and forth to rehab tech with close supervision. Patient demonstrated adequate balance reactions but decreased anterior weight shifting noted with patient maintaining posterior lean in sitting. Patient left sitting in wheelchair to return to room with rehab tech.   Therapy Documentation Precautions:   Precautions Precautions: Fall Precaution Comments: R field cut/R inattention, pushes to R  Restrictions Weight Bearing Restrictions: No Pain:  Pain Assessment Pain Assessment: No/denies pain Locomotion : Ambulation Ambulation/Gait Assistance: 1: +2 Total assist   See FIM for current functional status  Therapy/Group: Individual Therapy  Laretta Alstrom 07/08/2014, 10:59 AM

## 2014-07-08 NOTE — Progress Notes (Signed)
Occupational Therapy Weekly Progress Note  Patient Details  Name: Carolyn [name] MRN: [id number] Date of Birth: 07-11-1945  Beginning of progress report period: June 29, 2014 End of progress report period: July 08, 2014  Today's Date: 07/08/2014 OT Individual Time: 1130-1200 OT Individual Time Calculation (min): 30 min    Patient has met 5 of 10 long term goals.  Short term goals not set due to estimated length of stay.  Patient has made slow, steady progress during this reporting period. Patient currently requires mod A squat pivot transfer w/c<>toilet and min A squat pivot transfers to other functional surfaces. Patient consistently completes sit<>stand during LB self-care with min A and standing balance with mod A. Patient has demonstrated trace active movement with gravity assisted on 2 occasions, however no consistent AROM noted. Patient demonstrates improved attention to R and awareness of positioning of RUE.  Patient continues to demonstrate the following deficits: R hemiplegia, decreased midline orientation, decreased postural control, decreased sitting and standing balance, decreased core strength, R inattention, decreased awareness, cognitive-linguistic deficits, decreased safety awareness, decreased activity tolerance, decreased strength, aphasia, R field cut, decreased coordination, pushing tendencies to R, poor spatial awareness, decreased sensation, motor apraxia, decreased initiation and therefore will continue to benefit from skilled OT intervention to enhance overall performance with BADL, Reduce care partner burden and improve midline orientation, postural control, balance, attention to R, and strength  See Patient's Care Plan for progression toward long term goals.  Patient progressing toward long term goals..  Continue plan of care.  Skilled Therapeutic Interventions/Progress Updates:    Session 1: Pt seen for 1:1 OT session with focus on functional transfers, R NMR, standing  balance, and activity tolerance. Pt received sitting in w/c. Pt transferred to ADL kitchen with total A via w/c. Pt stood at counter with min-mod A while reaching into overhead cabinet and weight bearing through [place]. Pt completed task 2x standing for apprx 2 min before requiring rest break. In sitting provided muscle tapping to bicep and triceps to facilitate muscle activation. Pt with no AROM noted. Completed squat pivot transfer w/c<>low couch with min-mod A and min cues for setup and sequencing of w/c. Pt returned to room and left sitting in w/c with all needs in reach.   Session 2: Pt seen for ADL retraining with focus on functional transfers, sit<>stand, object identification, safety awareness, and activity tolerance. Pt received sitting in w/c. Completed squat pivot transfer w/c>rolling shower chair with mod A. Completed bathing at shower level with min cues for sequencing and attention to RUE. Pt completed squat pivot transfer rolling shower chair>w/c with mod A. Pt completed dressing sit<>stand from w/c with mod cues for recall of hemi dressing technique and problem solving. Pt required min A sit<>stand 2x during LB dressing. Engaged in object identification task of naming items by color with pt requiring max cues for identification at the word level. Pt returned to room and left sitting in w/c with all needs in reach.   Therapy Documentation Precautions:  Precautions Precautions: Fall Precaution Comments: R field cut/R inattention, pushes to R  Restrictions Weight Bearing Restrictions: No General:   Vital Signs:   Pain: No report of pain  See FIM for current functional status  Therapy/Group: Individual Therapy  Duayne Cal 07/08/2014, 4:14 PM

## 2014-07-08 NOTE — Progress Notes (Signed)
Social Work Patient ID: Carolyn [name], female   DOB: 01-09-1946, 69 y.o.   MRN: [id number]   Received SNF bed offer today from [facility] who can admit pt on Monday.  Pt and daughter informed and they are accepting this bed offer.  Have sent needed documentation to [facility] and [facility] has done the same.  Await BCBS authorization for SNF coverage and hope to have this prior to Monday.  Tx team aware of plans.  Jivan Symanski, LCSW

## 2014-07-09 ENCOUNTER — Inpatient Hospital Stay (HOSPITAL_COMMUNITY): Payer: BLUE CROSS/BLUE SHIELD

## 2014-07-09 ENCOUNTER — Inpatient Hospital Stay (HOSPITAL_COMMUNITY): Payer: BLUE CROSS/BLUE SHIELD | Admitting: Speech Pathology

## 2014-07-09 ENCOUNTER — Inpatient Hospital Stay (HOSPITAL_COMMUNITY): Payer: BLUE CROSS/BLUE SHIELD | Admitting: Physical Therapy

## 2014-07-09 NOTE — Discharge Summary (Signed)
Discharge summary job # [id number]

## 2014-07-09 NOTE — Progress Notes (Addendum)
Physical Therapy Session Note  Patient Details  Name: Zeniyah Zora MRN: [id number] Date of Birth: 04-23-1945  Today's Date: 07/09/2014 PT Individual Time: 1105-1221 PT Individual Time Calculation (min): 76 min   Short Term Goals: Week 6:  PT Short Term Goal 1 (Week 6): = LTGs due to anticipated LOS   Therapy Documentation Precautions:  Precautions Precautions: Fall Precaution Comments: R field cut/R inattention, pushes to R  Restrictions Weight Bearing Restrictions: No  Transfers: Sit to and from stand transfer with min assist; verbal cues even weight distribution and controlled descent.    Patient performed standing balance with utilization of mirror for 5 minutes focusing on upright posture, even weight distribution and positioning in midline. Patient utilized left wide based quad cane and min assist.   Patient up and down 6 inch step 2x max assist with left handrail. Patient required max assist for upright posture, positioning in midline, advancement, placement and stabilization of right lower extremity.  Ambulation: Patient ambulated 20 feet and 30 feet with left wide based quad cane mod assist.Patient ambulated with a varied step to and step through gait pattern. Patient required mod assist for upright posture, positioning in midline, advancement, placement and stabilization of right lower extremity. 2 assist for wheelchair follow.   Patient performed toileting during session stand pivot transfer to and from commode mod assist. Patient don and doffed pants and brief with max assist. Patient continent of bladder during session and also able to perform hygiene independently seated on commode.   Block practice sit to and from stand transfers 5x2 min assist with emphasis on even weight distribution, midline orientation, technique and controlled descent.  Short arc quads performed 3x10 with sweep tapping performed over quadriceps.    Patient tolerated treatment well.  Vitals monitored and remained stable throughout session responding appropriately to activity. Patient was without pain during session. Patient tolerated session with rest breaks throughout. Patient in room at end of session with Wheelchair seat belt engaged. Call bell within reach and family present.   See FIM for current functional status   See FIM for current functional status  Therapy/Group: Individual Therapy  Merri Ray 07/09/2014, 12:41 PM

## 2014-07-09 NOTE — Progress Notes (Signed)
Speech Language Pathology Daily Session Note  Patient Details  Name: Carolyn [name] MRN: [id number] Date of Birth: 13-Nov-1945  Today's Date: 07/09/2014 SLP Concurrent Time: 1400-1445 SLP Concurrent Time Calculation (min): 45 min   Short Term Goals: Week 6: SLP Short Term Goal 1 (Week 6): Patient will complete verbal expression at the phrase level with structured tasks with Min A multimodal cues with 75% accuracy.  SLP Short Term Goal 2 (Week 6): Patient will self-monitor and correct verbal errors with Mod A multimodal cues.   Skilled Therapeutic Interventions:  Pt was seen for concurrent ST therapy session targeting cognitive-linguistic goals.  SLP facilitated the session with a basic card game targeting functional problem solving and communication.  Pt planned and executed a problem solving strategy during the abovementioned task with min assist.  Pt contributed to functional conversations with SLP and playing partner at the phrase level with overall min-mod assist multimodal cues for awareness of verbal errors.  Continue per current plan of care.   FIM:  Comprehension Comprehension Mode: Auditory Comprehension: 5-Understands complex 90% of the time/Cues < 10% of the time Expression Expression Mode: Verbal Expression: 2-Expresses basic 25 - 49% of the time/requires cueing 50 - 75% of the time. Uses single words/gestures. Social Interaction Social Interaction: 5-Interacts appropriately 90% of the time - Needs monitoring or encouragement for participation or interaction. Problem Solving Problem Solving: 3-Solves basic 50 - 74% of the time/requires cueing 25 - 49% of the time Memory Memory: 4-Recognizes or recalls 75 - 89% of the time/requires cueing 10 - 24% of the time  Pain Pain Assessment Pain Assessment: No/denies pain  Therapy/Group: Other: concurrent   Maryjane Hurter 07/09/2014, 4:34 PM

## 2014-07-09 NOTE — Progress Notes (Signed)
Subjective/Complaints: No new problems overnight. Feels well. RN reported no issues. Denies pain, sob, cough, nvd, fever, skin issues, dysuria reported by nurse ROS limited by language  Objective: Vital Signs: Blood pressure 124/63, pulse 69, temperature 98 F (36.7 C), temperature source Oral, resp. rate 18, weight 81.3 kg (179 lb 3.7 oz), SpO2 99 %. No results found. No results found for this or any previous visit (from the past 72 hour(s)).   HEENT: left scalp wound healing nicely Cardio: RRR and no murmurs Resp: CTA B/L   GI: BS positive and nontender, nondistended Extremity:  Pulses positive and No Edema Skin:   Skin intact Neuro: very alert. More appropriate, Abnormal Sensory reduced sensation right upper extremity, left-sided strength is 4/5 in the upper and lower ex prox to distal. 0/5 right upper extremity, 2/5 left HF, KE and tr to 0/5 ankle. Continued exp aphasia---able to communicate in short sentences and phrases for the most part--no change Musc/Skel:  Full PROM,   Gen. no acute distress. Very alert   Assessment/Plan: 1. Functional deficits secondary to left intracranial hemorrhage status post evacuation 05/20/2014 with right hemiplegia and aphasia which require 3+ hours per day of interdisciplinary therapy in a comprehensive inpatient rehab setting. Physiatrist is providing close team supervision and 24 hour management of active medical problems listed below. Physiatrist and rehab team continue to assess barriers to discharge/monitor patient progress toward functional and medical goals.  Placement at beginning of next week  FIM: FIM - Bathing Bathing Steps Patient Completed: Chest, Right Arm, Abdomen, Right upper leg, Left upper leg, Front perineal area, Right lower leg (including foot) Bathing: 3: Mod-Patient completes 5-7 13f 10 parts or 50-74%  FIM - Upper Body Dressing/Undressing Upper body dressing/undressing steps patient completed: Thread/unthread left bra  strap, Thread/unthread left sleeve of pullover shirt/dress, Put head through opening of pull over shirt/dress, Thread/unthread right bra strap, Thread/unthread right sleeve of pullover shirt/dresss Upper body dressing/undressing: 3: Mod-Patient completed 50-74% of tasks FIM - Lower Body Dressing/Undressing Lower body dressing/undressing steps patient completed: Thread/unthread left pants leg, Don/Doff left shoe, Thread/unthread left underwear leg Lower body dressing/undressing: 2: Max-Patient completed 25-49% of tasks  FIM - Toileting Toileting steps completed by patient: Performs perineal hygiene Toileting Assistive Devices: Grab bar or rail for support Toileting: 2: Max-Patient completed 1 of 3 steps  FIM - Diplomatic Services operational officer Devices: Psychiatrist Transfers: 1-Mechanical lift  FIM - Banker Devices: Arm rests Bed/Chair Transfer: 4: Bed > Chair or W/C: Min A (steadying Pt. > 75%), 3: Chair or W/C > Bed: Mod A (lift or lower assist)  FIM - Locomotion: Wheelchair Distance: 5 ft Locomotion: Wheelchair: 1: Total Assistance/staff pushes wheelchair (Pt<25%) FIM - Locomotion: Ambulation Locomotion: Ambulation Assistive Devices: Occupational hygienist Ambulation/Gait Assistance: 1: +2 Total assist Locomotion: Ambulation: 1: Two helpers  Comprehension Comprehension Mode: Auditory Comprehension: 5-Understands complex 90% of the time/Cues < 10% of the time  Expression Expression Mode: Verbal Expression: 2-Expresses basic 25 - 49% of the time/requires cueing 50 - 75% of the time. Uses single words/gestures.  Social Interaction Social Interaction: 5-Interacts appropriately 90% of the time - Needs monitoring or encouragement for participation or interaction.  Problem Solving Problem Solving: 3-Solves basic 50 - 74% of the time/requires cueing 25 - 49% of the time  Memory Memory: 4-Recognizes or recalls 75 - 89% of the  time/requires cueing 10 - 24% of the time   Medical Problem List and Plan: 1. 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 DVT Prophylaxis/Anticoagulation: Subcutaneous heparin initiated 05/24/2014. No bleeding sequelae 3. Pain Management: Tylenol   effective 4. Seizure prophylaxis. Keppra 500 mg twice a day. EEG negative 5. Neuropsych: This patient remains incapable of making decisions on her own behalf. 6. Skin/Wound Care: Routine skin checks and pressure relief 7. Fluids/Electrolytes/Nutrition:labs reviewed and normal. Intake remains adequate to good 8. Dysphagia. Dysphagia #3 thin liquids continues   9. MRSA PCR screening positive. Contact precautions 10. Hypertension. Continue Toprol-XL 25 mg daily. Normotensive    11. Hypothyroidism. Synthroid 12. UTI--100k GNR--citrobacter---septra for 7 days  LOS (Days) 36 A FACE TO FACE EVALUATION WAS PERFORMED  Shetara Launer T 07/09/2014, 8:04 AM

## 2014-07-09 NOTE — Progress Notes (Signed)
Speech Language Pathology Weekly Progress Note  Patient Details  Name: Carolyn [name] MRN: [id number] Date of Birth: [birth year]  Beginning of progress report period: July 01, 2014 End of progress report period: July 09, 2014   Short Term Goals: Week 5: SLP Short Term Goal 1 (Week 5): Patient will consume current diet without overt s/s of aspiration with Mod I for use of swallow strategies.  SLP Short Term Goal 1 - Progress (Week 5): Met SLP Short Term Goal 2 (Week 5): Patient will scan to the right field of enviornment with supervision verbal cues during functional and familiar tasks. SLP Short Term Goal 2 - Progress (Week 5): Met SLP Short Term Goal 3 (Week 5): Patient will demonstrate selective attention to functional and familiar tasks for 60 minutes with supervision verbal cues for redirection in moderately distracting enviornment.   SLP Short Term Goal 3 - Progress (Week 5): Met SLP Short Term Goal 4 (Week 5): Patient will complete verbal expression at the phrase level with structured tasks with Min A multimodal cues with 75% accuracy.  SLP Short Term Goal 4 - Progress (Week 5): Not met SLP Short Term Goal 5 (Week 5): Patient will self-monitor and correct verbal errors with Max A multimodal cues.  SLP Short Term Goal 5 - Progress (Week 5): Met    New Short Term Goals: Week 6: SLP Short Term Goal 1 (Week 6): Patient will complete verbal expression at the phrase level with structured tasks with Min A multimodal cues with 75% accuracy.  SLP Short Term Goal 2 (Week 6): Patient will self-monitor and correct verbal errors with Mod A multimodal cues.   Weekly Progress Updates: Patient has made functional gains and has met 5 of 6 STG's this reporting period due to increased use of swallowing compensatory strategies, selective attention, attention to right field of environment/body and ability to self-monitor and correct verbal errors. Currently, patient is consuming regular textures with  thin liquids without overt s/s of aspiration and Mod I for use of swallowing compensatory strategies. Patient continues to demonstrate increased functional communication and is able to make her needs known and participate in a basic conversation at the phrase level with Min-Mod A multimodal cues and extra time. Patient is also naming functional items with 100% accuracy with Mod A multimodal cues but continues to require Max A to self-correct verbal errors throughout structured tasks. Patient also requires overall supervision verbal cues for selective attention in a moderately distracting environment and attention to right field of environment and Min A verbal and question cues for recall of information and functional problem solving.  Patient/family education ongoing. Patient would benefit from continued skilled SLP intervention to maximize her cognitive and swallowing function and functional communication prior to discharge.   Intensity: Minumum of 1-2 x/day, 30 to 90 minutes Frequency: 3 to 5 out of 7 days Duration/Length of Stay: TBD due to SNF placement  Treatment/Interventions: Cognitive remediation/compensation;Cueing hierarchy;Environmental controls;Dysphagia/aspiration precaution training;Functional tasks;Internal/external aids;Patient/family education;Speech/Language facilitation;Therapeutic Activities     [place], [place] 07/09/2014, 7:37 AM

## 2014-07-09 NOTE — Progress Notes (Signed)
Occupational Therapy Session Note  Patient Details  Name: Carolyn [name] MRN: [id number] Date of Birth: 1945/07/11  Today's Date: 07/09/2014 OT Individual Time: 0900-1000 OT Individual Time Calculation (min): 60 min    Short Term Goals: Week 5:  OT Short Term Goal 1 (Week 5): Focus on LTGs due expected discharge date  Skilled Therapeutic Interventions/Progress Updates:    Pt engaged in BADL retraining including bathing and dressing with sit<>stand from w/c at sink.  Pt performed squat pivot transfer bed->w/c with mod A and min multimodal cues for safety awareness and sequencing.  Pt requires min verbal cues for sequencing and using hemi bathing/dressing techniques.  Pt performed sit<>stand at sink with steady A and required min A for standing balance.  Pt initiated bathing buttocks but required assistance for thoroughness. Pt continues to required max verbal cues for positioning of RUE and attending to RUE.  Focus on activity tolerance, bed mobility, functional transfers, sit<>stand, standing balance, sequencing, and safety awareness.  Therapy Documentation Precautions:  Precautions Precautions: Fall Precaution Comments: R field cut/R inattention, pushes to R  Restrictions Weight Bearing Restrictions: No   Pain:  Pt denied pain ADL: ADL ADL Comments: Refer to FIM  See FIM for current functional status  Therapy/Group: Individual Therapy  Rich Brave 07/09/2014, 10:01 AM

## 2014-07-10 ENCOUNTER — Inpatient Hospital Stay (HOSPITAL_COMMUNITY): Payer: BLUE CROSS/BLUE SHIELD | Admitting: Occupational Therapy

## 2014-07-10 NOTE — Progress Notes (Signed)
Occupational Therapy Session Note  Patient Details  Name: Carolyn [name] MRN: [id number] Date of Birth: [birth year]  Today's Date: 07/10/2014 OT Individual Time:  -   1645-1715  (30 min)      Short Term Goals: Week 1:  OT Short Term Goal 1 (Week 1): Pt will be able to sit to EOB with mod A. OT Short Term Goal 1 - Progress (Week 1): Partly met OT Short Term Goal 2 (Week 1): Pt will maintain static sitting EOB with mod A. OT Short Term Goal 2 - Progress (Week 1): Partly met OT Short Term Goal 3 (Week 1): Pt will demonstrate increased awareness of RUE to wash it with min A. OT Short Term Goal 3 - Progress (Week 1): Not met OT Short Term Goal 4 (Week 1): Pt will don shirt with max A. OT Short Term Goal 4 - Progress (Week 1): Met OT Short Term Goal 5 (Week 1): Pt will roll in bed with mod A to assist caregivers with changing her LB clothing. OT Short Term Goal 5 - Progress (Week 1): Partly met Week 2:  OT Short Term Goal 1 (Week 2): Pt will demonstrate static sitting EOB/EOM for 2 min with min A OT Short Term Goal 1 - Progress (Week 2): Met OT Short Term Goal 2 (Week 2): Pt will demonstrate increased awareness of RUE to wash/dress with mod multimodal cues OT Short Term Goal 2 - Progress (Week 2): Met OT Short Term Goal 3 (Week 2): Pt will consistently complete functional transfer with max A  OT Short Term Goal 3 - Progress (Week 2): Partly met OT Short Term Goal 4 (Week 2): Pt will complete LB dressing with max A OT Short Term Goal 4 - Progress (Week 2): Not met OT Short Term Goal 5 (Week 2): Pt stand for 45 seconds with or without mechanical lift (e.g [name]) to increase activity tolerance and midline orientation OT Short Term Goal 5 - Progress (Week 2): Met Week 3:  OT Short Term Goal 1 (Week 3): Pt will complete LB dressing with max A+1 OT Short Term Goal 1 - Progress (Week 3): Progressing toward goal OT Short Term Goal 2 (Week 3): Pt will consistently complete toilet transfer with max A  and second person present for stabilizing w/c OT Short Term Goal 2 - Progress (Week 3): Met OT Short Term Goal 3 (Week 3): Pt will verbalize type of clothing during dressing task with 75% accuracy OT Short Term Goal 3 - Progress (Week 3): Met OT Short Term Goal 4 (Week 3): Pt will scan to right to locate 50% of self-care items with mod cues OT Short Term Goal 4 - Progress (Week 3): Met OT Short Term Goal 5 (Week 3): Pt will complete functional activity in standing for 75 seconds with max A for standing balance OT Short Term Goal 5 - Progress (Week 3): Met  Skilled Therapeutic Interventions/Progress Updates:     OT addressed basicfunctional transfers, bed mobility, standing balance, attention, attention, right upper extremity therapeutic activities in functional task.  OT provided tactile and gestural cues for patient to use right arm. Pt. Transferred from bed>wc>toilet>wc with mod assist.  She needed assist with clothes for pulling pants up and down.  Pt. Positioned wc at sink to wash hands with set up assist.  Had pt attend to move RUE for sensory information.  Pt. Remained in wc for lunch.  Left with all needs in place and safety belt.   Therapy Documentation  Precautions:  Precautions Precautions: Fall Precaution Comments: R field cut/R inattention, pushes to R  Restrictions Weight Bearing Restrictions: No      Pain:  none    ADL ADL Comments: Refer to FIM :       See FIM for current functional status  Therapy/Group: Individual Therapy  Lisa Roca 07/10/2014, 7:47 PM

## 2014-07-10 NOTE — Progress Notes (Signed)
Patient ID: Carolyn [name], female   DOB: Mar 21, 1945, 69 y.o.   MRN: [id number]   07/10/14.  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 Feels well. RN reported no issues. Denies pain, sob, cough, nvd, fever, skin issues, dysuria reported by nurse   Past Medical History  Diagnosis Date  . Hypertension   . AAA (abdominal aortic aneurysm)     a. s/p stent grafting @ [facility].  . Hemorrhagic cerebrovascular accident (CVA)     a. 05/20/2014 L hemispheric hemorrhagic CVA s/p decompressive craniotomy and evacuation.  . H/O echocardiogram     a. 04/2014 Echo: EF 65-70%, Gr 1 DD, mild LVH.    Patient Vitals for the past 24 hrs:  BP Temp Temp src Pulse Resp SpO2  07/10/14 0516 (!) 117/54 mmHg 98.8 F (37.1 C) Oral 73 18 96 %  07/09/14 2038 (!) 132/51 mmHg - - 79 - -  07/09/14 1403 (!) 134/54 mmHg 98.4 F (36.9 C) Oral 75 18 96 %     Intake/Output Summary (Last 24 hours) at 07/10/14 0937 Last data filed at 07/09/14 1900  Gross per 24 hour  Intake    560 ml  Output      0 ml  Net    560 ml     Objective: Vital Signs: Blood pressure 117/54, pulse 73, temperature 98.8 F (37.1 C), temperature source Oral, resp. rate 18, weight 179 lb 3.7 oz (81.3 kg), SpO2 96 %. No results found. No results found for this or any previous visit (from the past 72 hour(s)).   HEENT: left scalp wound healing nicely Cardio: RRR and no murmurs Resp: CTA B/L   GI: BS positive and nontender, nondistended Extremity:  Pulses positive and No Edema Skin:   Skin intact Neuro: very alert.  0/5 right upper extremity,  Musc/Skel:  Full PROM,   Gen. no acute distress. Very alert    Medical Problem List and Plan: 1. 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 DVT Prophylaxis/Anticoagulation: Subcutaneous heparin initiated  05/24/2014. No bleeding sequelae 3. Seizure prophylaxis. Keppra 500 mg twice a day. EEG negative 4. Dysphagia. Dysphagia #3 thin liquids continues   5. MRSA PCR screening positive. Contact precautions 6. Hypertension. Continue Toprol-XL 25 mg daily. Normotensive    7. UTI--100k GNR--citrobacter---septra for 7 days  LOS (Days) 37 A FACE TO FACE EVALUATION WAS PERFORMED  Rogelia Boga 07/10/2014, 9:35 AM

## 2014-07-10 NOTE — Discharge Summary (Signed)
NAMEJARONDA, Carolyn [name]            ACCOUNT NO.:  [id number]  MEDICAL RECORD NO.:  [id number]  LOCATION:  [id number]                        FACILITY:  [facility]  PHYSICIAN:  Ranelle Oyster, M.D.DATE OF BIRTH:  July 13, 1945  DATE OF ADMISSION:  06/03/2014 DATE OF DISCHARGE:  07/14/2014                              DISCHARGE SUMMARY   DISCHARGE DIAGNOSES: 1. Nontraumatic intracerebral hemorrhage status post left     frontoparietal craniotomy. 2. Subcutaneous heparin initiated on May 24, 2014, for DVT prophylaxis. 3. Seizure prophylaxis. 4. Dysphagia. 5. Methicillin-resistant Staphylococcus aureus PCR screening positive. 6. Hypertension. 7. Hypothyroidism. 8. Gram-negative rod urinary tract infection.  HISTORY OF PRESENT ILLNESS:  This is a 69 year old right-handed female with history of hypertension, recent abdominal aortic aneurysm stenting in [facility], maintained on aspirin and Plavix, by report she lived alone prior to admission.  Admitted on May 20, 2014, after developing severe headache, right arm weakness while at work, suddenly became unresponsive.  Blood pressure 241/123.  Cranial CT scan showed left ICH with a 3 mm midline shift.  The patient intubated, underwent left frontoparietal craniotomy, evacuation of intracerebral hematoma on May 20, 2014, by Dr. Gerlene Fee.  Nasogastric tube inserted for nutritional support.  Echocardiogram with an ejection fraction of 70%, grade 1 diastolic dysfunction.  Carotid Dopplers with left 40% to 59% ICA stenosis.  Maintained on Keppra for seizure prophylaxis.  EEG negative.  Cardiology Services consulted on May 23, 2014, for ectopic atrial bradycardia identified per EKG.  No acute ST-T changes, felt likely vagally mediated from CNS event and monitor with no further workup needed.  Latest followup cranial CT scan showed stable appearance of residual left frontoparietal hemorrhage.  There were areas of acute subacute  nonhemorrhagic infarct within the occipital lobes bilaterally. MR angiogram of the head and neck showed 60% stenosis of the proximal left ICA artery.  Moderate to high-grade stenosis proximal right vertebral artery.  Neurology consulted.  Recently placed on aspirin 81 mg daily as well as initiation of subcutaneous heparin.  She was extubated, followup swallow study, placed on a dysphagia #2 nectar thick liquid diet.  She was admitted for comprehensive rehab program.  PAST MEDICAL HISTORY:  See discharge diagnoses.  SOCIAL HISTORY:  Independent prior to admission, living alone.  FUNCTIONAL STATUS:  Upon admission to rehab services was max assist with 2 for squat pivot transfers, +2 physical assist for rolling in the bed, max total assist activities of daily living.  PHYSICAL EXAMINATION:  VITAL SIGNS:  Blood pressure 168/64, pulse 65, temperature 98, respirations 16.  GENERAL:  This was an alert female, opens her eyes to command, expressive as well as receptive aphasia.  0/5 right upper and right lower extremity.  Craniotomy site clean and dry.  LUNGS:  Clear to auscultation.  CARDIAC:  Regular rate and rhythm.  ABDOMEN:  Soft, nontender.  Good bowel sounds.  REHABILITATION HOSPITAL COURSE:  The patient was admitted to inpatient rehab services with therapies initiated on a 3-hour daily basis consisting of physical therapy, occupational therapy, speech therapy, and rehabilitation nursing.  The following issues were addressed during the patient's rehabilitation stay.  Pertaining to [name]'s nontraumatic ICH, she had undergone left frontoparietal craniotomy with  followup Neurosurgery.  Subcutaneous heparin for DVT prophylaxis.  No bleeding episodes.  She was now maintained on low-dose aspirin therapy 81 mg daily per Neurology Services.  Keppra for seizure prophylaxis. EEG negative.  Blood pressure is well controlled on Lopressor with no orthostatic changes.  She continued on  hormone supplement for hypothyroidism.  She was completing a 7-day course of Septra for gram- negative rod urinary tract infection.  Her diet had been advanced to a regular consistency, which she tolerated well.  The patient received weekly collaborative interdisciplinary team conferences to discuss estimated length of stay, family teaching, and any barriers to discharge.  The patient demonstrated improvement in squat pivot transfers to min-to-mod assist, still needing some cues.  Min-to-mod assist bed mobility as well as cues for sequencing, supervision sitting balance with supervision, moderate assist standing balance, ambulating 20 feet, wide-base quad cane max assist, activities of daily living. Again required moderate assist squat pivot transfers, wheelchair to toilet as well as to functional surfaces.  Consistently completed sit to stand during lower body self-care with minimal assist.  Speech Therapy followup for cognition, problem solving, auditory comprehension, as well as new learning of tasks required moderate assist for problem solving. Demonstrated increased spontaneous verbal expression with her aphasia. Plan was for discharge to skilled nursing facility on July 12, 2014, and discharge taking place.  DISCHARGE MEDICATIONS: 1. Aspirin 81 mg p.o. daily. 2. Celexa 10 mg p.o. daily. 3. Keppra 500 mg p.o. b.i.d. 4. Synthroid 75 mcg p.o. daily. 5. Lopressor 12.5 mg p.o. b.i.d. 6. Multivitamin 1 tablet p.o. daily. 7. Protonix 40 mg p.o. daily. 8. Bactrim 400/80 mg 1 tablet every 12 hours x3 more days and stop.  DIET:  Regular.  FOLLOWUP:  She would follow up with Dr. Faith Rogue at the [facility] on September 07, 2014, Dr. Gerlene Fee, Neurosurgery; call for appointment; Dr. Roda Shutters, Neurology Services, call for appointment in 2 months.     Mariam Dollar, P.A.   ______________________________ Ranelle Oyster, M.D.    DA/MEDQ  D:  07/09/2014  T:   07/10/2014  Job:  [id number]  cc:   Dr. Molli Knock, M.D. Jola Schmidt, MD

## 2014-07-11 ENCOUNTER — Inpatient Hospital Stay (HOSPITAL_COMMUNITY): Payer: BLUE CROSS/BLUE SHIELD | Admitting: Physical Therapy

## 2014-07-11 ENCOUNTER — Inpatient Hospital Stay (HOSPITAL_COMMUNITY): Payer: BLUE CROSS/BLUE SHIELD | Admitting: Occupational Therapy

## 2014-07-11 NOTE — Progress Notes (Signed)
Physical Therapy Session Note  Patient Details  Name: Carolyn [name] MRN: [id number] Date of Birth: [birth year]  Today's Date: 07/11/2014 PT Individual Time: 4142-3953 PT Individual Time Calculation (min): 60 min   Short Term Goals: Week 1:  PT Short Term Goal 1 (Week 1): Patient will perform bed mobility with max A x 1.  PT Short Term Goal 1 - Progress (Week 1): Progressing toward goal PT Short Term Goal 2 (Week 1): Patient will perform bed <> wheelchair transfer with mod A x 2.  PT Short Term Goal 2 - Progress (Week 1): Progressing toward goal PT Short Term Goal 3 (Week 1): Patient will ambulate 10 ft with +2 assist and wheelchair follow.  PT Short Term Goal 3 - Progress (Week 1): Not met PT Short Term Goal 4 (Week 1): Patient will initiate stair training.  PT Short Term Goal 4 - Progress (Week 1): Not met PT Short Term Goal 5 (Week 1): Patient will perform dynamic standing balance x 2 min with +2 assist.  PT Short Term Goal 5 - Progress (Week 1): Not met  Skilled Therapeutic Interventions/Progress Updates:  Pt was seen bedside in the am. Pt transferred supine to edge of bed with side rail, head of bed elevated, and min A with verbal cues. Pt transferred edge of bed to R side with mod A and verbal cues. Pt transported to rehab gym. Pt transferred w/c to edge of mat to L with min A and verbal cues. Pt returned to w/c from mat to the L with mod A and verbal cues. Pt transferred w/c to edge of mat, edge of mat to w/c with min to mod A and verbal cues. Pt performed sit to stand transfers 2 sets x 5 reps each while standing focused on midline positioning and weight shifting. Pt ambulated 25 feet x 2 with L hand rail and mod A with verbal cues and on/off physical assist with R LE. Pt returned to room and left sitting up in w/c with quick release belt in place.   Therapy Documentation Precautions:  Precautions Precautions: Fall Precaution Comments: R field cut/R inattention, pushes to R   Restrictions Weight Bearing Restrictions: No General:   Pain: No c/o pain.    Locomotion : Ambulation Ambulation/Gait Assistance: 3: Mod assist   See FIM for current functional status  Therapy/Group: Individual Therapy  Dub Amis 07/11/2014, 12:42 PM

## 2014-07-11 NOTE — Progress Notes (Signed)
Occupational Therapy Session Note  Patient Details  Name: Carolyn [name] MRN: [id number] Date of Birth: 1945-03-15  Today's Date: 07/11/2014 OT Individual Time:  -   0800-0900  (60 min)  1st session   Short Term Goals: Week 1:  OT Short Term Goal 1 (Week 1): Pt will be able to sit to EOB with mod A. OT Short Term Goal 1 - Progress (Week 1): Partly met OT Short Term Goal 2 (Week 1): Pt will maintain static sitting EOB with mod A. OT Short Term Goal 2 - Progress (Week 1): Partly met OT Short Term Goal 3 (Week 1): Pt will demonstrate increased awareness of RUE to wash it with min A. OT Short Term Goal 3 - Progress (Week 1): Not met OT Short Term Goal 4 (Week 1): Pt will don shirt with max A. OT Short Term Goal 4 - Progress (Week 1): Met OT Short Term Goal 5 (Week 1): Pt will roll in bed with mod A to assist caregivers with changing her LB clothing. OT Short Term Goal 5 - Progress (Week 1): Partly met Week 2:  OT Short Term Goal 1 (Week 2): Pt will demonstrate static sitting EOB/EOM for 2 min with min A OT Short Term Goal 1 - Progress (Week 2): Met OT Short Term Goal 2 (Week 2): Pt will demonstrate increased awareness of RUE to wash/dress with mod multimodal cues OT Short Term Goal 2 - Progress (Week 2): Met OT Short Term Goal 3 (Week 2): Pt will consistently complete functional transfer with max A  OT Short Term Goal 3 - Progress (Week 2): Partly met OT Short Term Goal 4 (Week 2): Pt will complete LB dressing with max A OT Short Term Goal 4 - Progress (Week 2): Not met OT Short Term Goal 5 (Week 2): Pt stand for 45 seconds with or without mechanical lift (e.g [name]) to increase activity tolerance and midline orientation OT Short Term Goal 5 - Progress (Week 2): Met Week 3:  OT Short Term Goal 1 (Week 3): Pt will complete LB dressing with max A+1 OT Short Term Goal 1 - Progress (Week 3): Progressing toward goal OT Short Term Goal 2 (Week 3): Pt will consistently complete toilet transfer  with max A and second person present for stabilizing w/c OT Short Term Goal 2 - Progress (Week 3): Met OT Short Term Goal 3 (Week 3): Pt will verbalize type of clothing during dressing task with 75% accuracy OT Short Term Goal 3 - Progress (Week 3): Met OT Short Term Goal 4 (Week 3): Pt will scan to right to locate 50% of self-care items with mod cues OT Short Term Goal 4 - Progress (Week 3): Met OT Short Term Goal 5 (Week 3): Pt will complete functional activity in standing for 75 seconds with max A for standing balance OT Short Term Goal 5 - Progress (Week 3): Met  Skilled Therapeutic Interventions/Progress Updates:      1st session:  Trasnferred  From bed to rolling shower cahir to toilet to rolling shower chair to shower stall.  Pt. Lying in bed upon OT arrival  Pt indicated she had to use bathroom.  Performed trasnferr from bed to rolling shower chairr to toilet to rolling shower chair to shower stall.  Pt. Used tub bench with min assist.  Pt. Washed self with max assistt for reaching all body parts.  Transferred out to wc and completed bra, shirt, pants, depends.with max assist.  Pt. Combed hair.  She  elected to stay in wc at end of session with seat belt on and items in reach.    Time:  1345-1415  (30 min)  2nd session Pain: none   Individual session:  Pt sitting in wc.  Pt indicated she had been up since 9am and wanted to lie down.  Positioned wc for pt to do lateral scooting transfer to right with mod assist.  Provided cues for scooting.  Pt. positioned in bed with her assisting getting to [facility] with mod assist.  She performed bridging x 10 with stabilization provided to RLE.  Pt. Left in bed with all needs in place.  Therapy Documentation Precautions:  Precautions Precautions: Fall Precaution Comments: R field cut/R inattention, pushes to R  Restrictions Weight Bearing Restrictions: No      Pain: none   ADL: ADL ADL Comments: Refer to FIM        See FIM for current  functional status  Therapy/Group: Individual Therapy  Lisa Roca 07/11/2014, 7:39 AM

## 2014-07-11 NOTE — Progress Notes (Signed)
Patient ID: [name], female   DOB: October 17, 1945, 69 y.o.   MRN: [id number]  Patient ID: [name], female   DOB: 04-Apr-1945, 69 y.o.   MRN: [id number]   07/11/14.  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 Feels well. RN reported no issues. Denies pain, sob, cough, nvd, fever, skin issues, dysuria reported by nurse   Past Medical History  Diagnosis Date  . Hypertension   . AAA (abdominal aortic aneurysm)     a. s/p stent grafting @ [facility].  . Hemorrhagic cerebrovascular accident (CVA)     a. 05/20/2014 L hemispheric hemorrhagic CVA s/p decompressive craniotomy and evacuation.  . H/O echocardiogram     a. 04/2014 Echo: EF 65-70%, Gr 1 DD, mild LVH.    Patient Vitals for the past 24 hrs:  BP Temp Temp src Pulse Resp SpO2  07/11/14 0500 117/82 mmHg 97.6 F (36.4 C) Oral 76 18 -  07/10/14 2040 (!) 137/49 mmHg - - 72 - -  07/10/14 1400 (!) 136/52 mmHg 98.5 F (36.9 C) Oral 77 18 97 %     Intake/Output Summary (Last 24 hours) at 07/11/14 0846 Last data filed at 07/10/14 1800  Gross per 24 hour  Intake    840 ml  Output      0 ml  Net    840 ml     Objective: Vital Signs: Blood pressure 117/82, pulse 76, temperature 97.6 F (36.4 C), temperature source Oral, resp. rate 18, weight 179 lb 3.7 oz (81.3 kg), SpO2 97 %. No results found. No results found for this or any previous visit (from the past 72 hour(s)).   HEENT: left scalp wound healing nicely Cardio: RRR and no murmurs Resp: CTA B/L   GI: BS positive and nontender, nondistended Extremity:  Pulses positive and No Edema Skin:   Skin intact Neuro: very alert.  0/5 right upper extremity,  Musc/Skel:  Full PROM,   Gen. no acute distress. Very alert    Medical Problem List and Plan: 1. Functional deficits secondary to nontraumatic ICH status post left frontoparietal  craniotomy 2. DVT Prophylaxis/Anticoagulation: Subcutaneous heparin initiated 05/24/2014. No bleeding sequelae 3. Seizure prophylaxis. Keppra 500 mg twice a day. EEG negative 4. Dysphagia. Dysphagia #3 thin liquids continues   5. MRSA PCR screening positive. Contact precautions 6. Hypertension. Continue Toprol-XL 25 mg daily. Normotensive    7. UTI--100k GNR--citrobacter---septra for 7 days  LOS (Days) 38 A FACE TO FACE EVALUATION WAS PERFORMED  Rogelia Boga 07/11/2014, 8:46 AM

## 2014-07-11 NOTE — Progress Notes (Signed)
Physical Therapy Session Note  Patient Details  Name: Carolyn [name] MRN: [id number] Date of Birth: 1945/05/04  Today's Date: 07/11/2014 PT Individual Time: 1415-1445 PT Individual Time Calculation (min): 30 min   Short Term Goals: Week 1:  PT Short Term Goal 1 (Week 1): Patient will perform bed mobility with max A x 1.  PT Short Term Goal 1 - Progress (Week 1): Progressing toward goal PT Short Term Goal 2 (Week 1): Patient will perform bed <> wheelchair transfer with mod A x 2.  PT Short Term Goal 2 - Progress (Week 1): Progressing toward goal PT Short Term Goal 3 (Week 1): Patient will ambulate 10 ft with +2 assist and wheelchair follow.  PT Short Term Goal 3 - Progress (Week 1): Not met PT Short Term Goal 4 (Week 1): Patient will initiate stair training.  PT Short Term Goal 4 - Progress (Week 1): Not met PT Short Term Goal 5 (Week 1): Patient will perform dynamic standing balance x 2 min with +2 assist.  PT Short Term Goal 5 - Progress (Week 1): Not met  Skilled Therapeutic Interventions/Progress Updates:  Pt was seen bedside in the pm. Pt just got back in bed at completion of OT treatment. Pt fatigued and initially reluctant to participate with therapy. Pt stated she needed to use the bathroom. Pt transferred supine to edge of bed with side rail, head of bed elevated and min A. Pt transferred edge of bed to w/c with mod A and verbal cues. Pt transported into bathroom. Pt transferred w/c to toilet with mod A and verbal cues. Pt transferred sit to stand several times from commode with min to mod A and able to maintain standing balance with min to mod A and verbal cues. Pt transferred toilet to w/c with mod A and verbal cues. Pt performed LE exercises seated in w/c 3 sets x 10 reps each for LAQs and hip flexion, AROM L LE and AAROM R LE. Pt fatigued and requested to get back in bed unable to redirect. Pt transferred w/c to edge of bed with mod A and verbal cues. Pt transferred edge of bed to  supine with min A and verbal cues. Pt moved up in bed with bed controls, L UE/LE and mod with verbal cues. Pt left sitting up in bed with call bell within reach and bed alarm on.   Therapy Documentation Precautions:  Precautions Precautions: Fall Precaution Comments: R field cut/R inattention, pushes to R  Restrictions Weight Bearing Restrictions: No General: PT Amount of Missed Time (min): 15 Minutes PT Missed Treatment Reason: Patient fatigue Vital Signs:   Pain: No c/o pain.    Locomotion : Ambulation Ambulation/Gait Assistance: 3: Mod assist   See FIM for current functional status  Therapy/Group: Individual Therapy  Dub Amis 07/11/2014, 2:48 PM

## 2014-07-12 ENCOUNTER — Inpatient Hospital Stay (HOSPITAL_COMMUNITY): Payer: BLUE CROSS/BLUE SHIELD | Admitting: Physical Therapy

## 2014-07-12 ENCOUNTER — Inpatient Hospital Stay (HOSPITAL_COMMUNITY): Payer: BLUE CROSS/BLUE SHIELD | Admitting: Speech Pathology

## 2014-07-12 ENCOUNTER — Inpatient Hospital Stay (HOSPITAL_COMMUNITY): Payer: BLUE CROSS/BLUE SHIELD

## 2014-07-12 NOTE — Progress Notes (Signed)
Physical Therapy Discharge Summary  Patient Details  Name: Carolyn [name] MRN: [id number] Date of Birth: 1945/03/11  Today's Date: 07/12/2014 PT Individual Time: 1030-1115 PT Individual Time Calculation (min): 45 min    Patient has met 8 of 8 long term goals due to improved activity tolerance, improved balance, improved postural control, increased strength, ability to compensate for deficits, functional use of  right lower extremity, improved attention and improved awareness.  Patient to discharge at a wheelchair level [place] with continued need for max A for gait.   Patient's care partner unable to provide the necessary physical and cognitive assistance at discharge.  Reasons goals not met: All goals met  Recommendation:  Patient will benefit from ongoing skilled PT services in skilled nursing facility setting to continue to advance safe functional mobility, address ongoing impairments in R sided strength, motor control, timing/sequencing and motor planning, postural control, balance, gait, cognition, proprioception, and minimize fall risk.  Equipment: No equipment provided; defer to next venue of care  Reasons for discharge: treatment goals met and discharge from hospital  Patient/family agrees with progress made and goals achieved: Yes  PT Discharge Precautions/Restrictions Precautions Precautions: Fall Precaution Comments: R field cut Restrictions Weight Bearing Restrictions: No Pain Pain Assessment Pain Assessment: No/denies pain Vision/Perception  Vision - Assessment Eye Alignment: Within Functional Limits Ocular Range of Motion: Within Functional Limits Alignment/Gaze Preference: Within Defined Limits Tracking/Visual Pursuits: Requires cues, head turns, or add eye shifts to track Saccades: Impaired - to be further tested in functional context Convergence: Impaired - to be further tested in functional context Additional Comments: R field cut  Cognition Overall  Cognitive Status: Impaired/Different from baseline Arousal/Alertness: Lethargic Orientation Level: Oriented X4 Attention: Sustained;Selective Sustained Attention: Appears intact Sustained Attention Impairment: Functional basic;Verbal basic Memory: Impaired Memory Impairment: Decreased recall of new information Awareness: Impaired Awareness Impairment: Emergent impairment Problem Solving: Impaired Problem Solving Impairment: Functional basic Safety/Judgment: Impaired Sensation Sensation Light Touch: Impaired Detail Light Touch Impaired Details: Absent RLE;Absent RUE Proprioception: Impaired Detail Proprioception Impaired Details: Absent RLE;Absent RUE Coordination Gross Motor Movements are Fluid and Coordinated: No Fine Motor Movements are Fluid and Coordinated: No Coordination and Movement Description: no AROM RUE Motor  Motor Motor: Hemiplegia;Motor apraxia;Abnormal postural alignment and control Motor - Discharge Observations: R hemiplegia  Mobility Bed Mobility Bed Mobility: Supine to Sit;Sit to Supine Supine to Sit: 4: Min assist Supine to Sit Details: Verbal cues for technique;Tactile cues for initiation;Tactile cues for sequencing;Visual cues/gestures for sequencing;Manual facilitation for weight shifting;Manual facilitation for placement Sit to Supine: 4: Min assist Sit to Supine - Details (indicate cue type and reason): Performed supine <> sit on flat mat, no rails with min A for management of RLE onto mat with verbal cues for "ear to pillow" to prevent trunk posterior rotation during sit > supine.  Supine > sit with min A for roll to R side and min A for trunk control during side > sit; pt able to advance RLE off of mat during side > sit.  Required max-total verbal cues for sequencing Transfers Transfers: Yes Sit to Stand: 4: Min assist Sit to Stand Details: Manual facilitation for weight bearing;Manual facilitation for placement;Manual facilitation for weight  shifting Stand to Sit: 4: Min assist Stand to Sit Details (indicate cue type and reason): Manual facilitation for weight shifting;Manual facilitation for weight bearing;Manual facilitation for placement Squat Pivot Transfers: 3: Mod assist Squat Pivot Transfer Details (indicate cue type and reason): Pt performed multiple squat pivot transfers mat <> w/c to  L and R with mod lifting assistance and to maintain squat during full pivot.  Required over the back technique to maintain forward weight shift over BOS and max-total verbal cues for sequencing of transfer Locomotion  Ambulation Ambulation: Yes Ambulation/Gait Assistance: 2: Max assist Ambulation Distance (Feet): 20 Feet Assistive device: Large base quad cane Ambulation/Gait Assistance Details: Performed gait x 20' in controlled environment with L WBQC and max A of one therapist on R side providing facilitation at trunk and pelvis for trunk control, weight shifting, RLE advancement and stabilization in stance and max-total verbal cues for sequencing Gait Gait: Yes Gait Pattern: Impaired Gait Pattern: Step-to pattern;Decreased step length - right;Decreased step length - left;Decreased stance time - right;Decreased stride length;Decreased hip/knee flexion - right;Decreased dorsiflexion - right;Decreased weight shift to left;Right genu recurvatum;Decreased trunk rotation;Trunk flexed;Narrow base of support Stairs / Additional Locomotion Stairs: Yes Stairs Assistance: 2: Max Armed forces technical officer Details (indicate cue type and reason): Performed up/down one step forwards to ascend, backwards to descend with UE support on L rail and max-total verbal cues for sequencing; max A needed for trunk control, weight shifting and RLE advancement and stabilization in stance. Stair Management Technique: One rail Left;Step to pattern;Forwards;Backwards Number of Stairs: 1 Height of Stairs: 6 Architect: Yes Wheelchair  Assistance: 4: Energy manager: Left upper extremity;Left lower extremity Wheelchair Parts Management: Needs assistance Distance: 150; min A needed to assist with steering w/c with L foot.  Trunk/Postural Assessment  Cervical Assessment Cervical Assessment: Within Functional Limits Thoracic Assessment Thoracic Assessment: Exceptions to [facility] (kyphotic; R trunk elongated, L trunk shortened) Lumbar Assessment Lumbar Assessment: Exceptions to [facility] (posterior tilt) Postural Control Postural Control: Deficits on evaluation Head Control: improved Trunk Control: Continues to push in standing to R but also advances COG anterior to BOS during gait  Balance Balance Balance Assessed: Yes Static Sitting Balance Static Sitting - Balance Support: Feet supported;Left upper extremity supported Static Sitting - Level of Assistance: 5: Stand by assistance Dynamic Sitting Balance Dynamic Sitting - Balance Support: Left upper extremity supported Dynamic Sitting - Level of Assistance: 5: Stand by assistance Static Standing Balance Static Standing - Balance Support: Right upper extremity supported;Left upper extremity supported Static Standing - Level of Assistance: 3: Mod assist Dynamic Standing Balance Dynamic Standing - Balance Support: Right upper extremity supported;Left upper extremity supported Dynamic Standing - Level of Assistance: 3: Mod assist Extremity Assessment  RUE Assessment RUE Assessment: Exceptions to [facility] (inconsistent trace activation at shoulder and tricp with gravity assisted) RUE PROM (degrees) Overall PROM Right Upper Extremity: Within functional limits for tasks performed RUE Tone RUE Tone: Other (Comment) (inconsistent tone flexor tone at biceps) LUE Assessment LUE Assessment: Within Functional Limits RLE Assessment RLE Assessment: Exceptions to [facility] RLE Strength RLE Overall Strength: Deficits RLE Overall Strength Comments: 2-3/5 overall; no active DF  noted LLE Assessment LLE Assessment: Within Functional Limits  See FIM for current functional status  Raylene Everts [facility] 07/12/2014, 12:25 PM

## 2014-07-12 NOTE — Plan of Care (Signed)
Problem: RH Toileting Goal: LTG Patient will perform toileting w/assist, cues/equip (OT) LTG: Patient will perform toiletiing (clothes management/hygiene) with assist, with/without cues using equipment (OT)  Outcome: Not Met (add Reason) Inconsistently met as pt required total A following BM  Problem: RH Functional Use of Upper Extremity Goal: LTG Patient will use RT/LT upper extremity as a (OT) LTG: Patient will use right/left upper extremity as a stabilizer/gross assist/diminished/nondominant/dominant level with assist, with/without cues during functional activity (OT)  Outcome: Not Met (add Reason) Not met as pt requires total A for use of RUE as stabilizer

## 2014-07-12 NOTE — Progress Notes (Signed)
Occupational Therapy Session Note  Patient Details  Name: Carolyn [name] MRN: [id number] Date of Birth: 1945-07-13  Today's Date: 07/12/2014 OT Individual Time: 0915-1000 OT Individual Time Calculation (min): 45 min    Short Term Goals: Week 5:  OT Short Term Goal 1 (Week 5): Focus on LTGs due expected discharge date  Skilled Therapeutic Interventions/Progress Updates:    Pt seen for ADL retraining with focus on functional transfers, standing balance, attention to R, postural control, and visual scanning. Pt received supine in bed. Pt completed supine>sit with min A and squat pivot transfer bed>rolling shower chair with mod A. Completed bathing at shower level with min cues for washing RUE. Pt completed dressing sit<>stand from w/c level with pt verbally identifying 4/4 clothing items with mod cues. Pt required min A sit<>stand during LB dressing with therapist blocking at R knee only. Pt required increased time and min cues for problem solving with use of hemi dressing technique. Completed oral care from w/c with total A for positioning RUE as stabilizer at sink. Practiced squat pivot transfer w/c>toilet min A and mod A toilet>w/c (to L). Pt left sitting in w/c with all needs in reach.   Therapy Documentation Precautions:  Precautions Precautions: Fall Precaution Comments: R field cut Restrictions Weight Bearing Restrictions: No General:   Vital Signs:  Pain: Pain Assessment Pain Assessment: No/denies pain ADL: ADL ADL Comments: Refer to FIM Exercises:   Other Treatments:    See FIM for current functional status  Therapy/Group: Individual Therapy  [name] N 07/12/2014, 3:00 PM

## 2014-07-12 NOTE — Progress Notes (Addendum)
Occupational Therapy Discharge Summary  Patient Details  Name: Carolyn [name] MRN: [id number] Date of Birth: 1945-12-12  Today's Date: 07/14/2014 OT Individual Time: 1100-1143 OT Individual Time Calculation (min): 43 min    Patient has met 8 of 10 long term goals due to improved activity tolerance, improved balance, postural control, ability to compensate for deficits, functional use of  RIGHT lower extremity, improved attention, improved awareness and improved coordination.  Patient to discharge at overall min-mod assist functional transfers and max A LB dressing and toileting level.  Patient's care partner not necessary to provide the necessary physical and cognitive assistance at discharge as patient is discharging to SNF.    Reasons goals not met: Patient did not meet toileting goal due to inconsistency. Patient required total A for toileting following BM for hygiene. Patient requires total A for use of RUE as stabilizer.  Recommendation:  Patient will benefit from ongoing skilled OT services in skilled nursing facility setting to continue to advance functional skills in the area of BADLs, functional transfers, R NMR, postural control, balance, safety, activity tolerance.  Equipment: No equipment provided  Reasons for discharge: treatment goals met and discharge from hospital  Patient/family agrees with progress made and goals achieved: Yes  Skilled Therapeutic Intervention Pt seen for ADL retraining with focus on functional transfers, standing balance, postural control, attention to R, and hemi dressing. Pt received sitting in w/c. Completed squat pivot transfer w/c>rolling shower chair with min A and mod cues for sequencing. Completed bathing at shower level with min cues for washing RUE and improved postural with anterior weight shift to wash BLEs. Pt completed squat pivot transfer rolling shower chair>w/c with min A. Completed dressing sit<>stand from w/c with min cues for  sequencing of hemi dressing techniques and min A standing balance during LB dressing. Pt completed grooming tasks from w/c due to fatigue. At end of session pt left sitting in w/c with all needs in reach.   OT Discharge Precautions/Restrictions  Precautions Precautions: Fall Precaution Comments: R field cut Restrictions Weight Bearing Restrictions: No General   Vital Signs  Pain Pain Assessment Pain Assessment: No/denies pain ADL ADL ADL Comments: Refer to FIM Vision/Perception  Vision- History Baseline Vision/History: Wears glasses Wears Glasses: At all times Patient Visual Report: No change from baseline Vision- Assessment Vision Assessment?: Yes;Vision impaired- to be further tested in functional context Eye Alignment: Within Functional Limits Ocular Range of Motion: Within Functional Limits Alignment/Gaze Preference: Within Defined Limits Tracking/Visual Pursuits: Requires cues, head turns, or add eye shifts to track Saccades: Impaired - to be further tested in functional context Convergence: Impaired - to be further tested in functional context Visual Fields: Right visual field deficit Additional Comments: R field cut  Cognition Overall Cognitive Status: Impaired/Different from baseline Arousal/Alertness: Lethargic Orientation Level: Oriented X4 Attention: Sustained;Selective Sustained Attention: Appears intact Sustained Attention Impairment: Functional basic;Verbal basic Memory: Impaired Memory Impairment: Decreased recall of new information Awareness: Impaired Awareness Impairment: Emergent impairment Problem Solving: Impaired Problem Solving Impairment: Functional basic Safety/Judgment: Impaired Sensation Sensation Light Touch: Impaired Detail Light Touch Impaired Details: Absent RLE;Absent RUE Proprioception: Impaired Detail Proprioception Impaired Details: Absent RLE;Absent RUE Coordination Gross Motor Movements are Fluid and Coordinated: No Fine  Motor Movements are Fluid and Coordinated: No Coordination and Movement Description: no AROM RUE Motor  Motor Motor: Hemiplegia;Motor apraxia;Abnormal postural alignment and control Motor - Discharge Observations: R hemiplegia Mobility  Bed Mobility Bed Mobility: Supine to Sit;Sit to Supine Supine to Sit: 4: Min assist Supine to Sit Details:  Verbal cues for technique;Tactile cues for initiation;Tactile cues for sequencing;Visual cues/gestures for sequencing;Manual facilitation for weight shifting;Manual facilitation for placement Sit to Supine: 4: Min assist Sit to Supine - Details (indicate cue type and reason): Performed supine <> sit on flat mat, no rails with min A for management of RLE onto mat with verbal cues for "ear to pillow" to prevent trunk posterior rotation during sit > supine.  Supine > sit with min A for roll to R side and min A for trunk control during side > sit; pt able to advance RLE off of mat during side > sit.  Required max-total verbal cues for sequencing Transfers Transfers: Sit to Stand;Stand to Sit Sit to Stand: 4: Min assist Sit to Stand Details: Manual facilitation for weight bearing;Manual facilitation for placement;Manual facilitation for weight shifting Stand to Sit: 4: Min assist Stand to Sit Details (indicate cue type and reason): Manual facilitation for weight shifting;Manual facilitation for weight bearing;Manual facilitation for placement  Trunk/Postural Assessment  Cervical Assessment Cervical Assessment: Exceptions to [facility] (forward head) Thoracic Assessment Thoracic Assessment: Exceptions to [facility] (kyphotic) Lumbar Assessment Lumbar Assessment: Exceptions to [facility] (posterior pelvic tilt)  Balance Balance Balance Assessed: Yes Static Sitting Balance Static Sitting - Level of Assistance: 5: Stand by assistance Dynamic Sitting Balance Dynamic Sitting - Level of Assistance: 5: Stand by assistance Static Standing Balance Static Standing - Level of  Assistance: 4: Min assist Dynamic Standing Balance Dynamic Standing - Balance Support: During functional activity Dynamic Standing - Level of Assistance: 3: Mod assist;4: Min assist Extremity/Trunk Assessment RUE Assessment RUE Assessment: Exceptions to [facility] (inconsistent trace activation at shoulder and tricp with gravity assisted) RUE PROM (degrees) Overall PROM Right Upper Extremity: Within functional limits for tasks performed RUE Tone RUE Tone: Other (Comment) (inconsistent tone flexor tone at biceps) LUE Assessment LUE Assessment: Within Functional Limits  See FIM for current functional status  Anaise Sterbenz N 07/12/2014, 12:19 PM

## 2014-07-12 NOTE — Progress Notes (Signed)
Speech Language Pathology Daily Session Note  Patient Details  Name: Carolyn [name] MRN: [id number] Date of Birth: 07-26-1945  Today's Date: 07/12/2014 SLP Individual Time: 1000-1030 SLP Individual Time Calculation (min): 30 min  Short Term Goals: Week 6: SLP Short Term Goal 1 (Week 6): Patient will complete verbal expression at the phrase level with structured tasks with Min A multimodal cues with 75% accuracy.  SLP Short Term Goal 2 (Week 6): Patient will self-monitor and correct verbal errors with Mod A multimodal cues.   Skilled Therapeutic Interventions: Skilled treatment session focused on cognitive-linguistic goals. SLP facilitated session by providing Min-Mod A phonemic and sentence completion cues to complete convergent and divergent naming tasks as well as category exclusion tasks. Patient also completed 2 step directions with Min A verbal and question cues and answer yes/no complex questions with 80% accuracy. Patient left upright in wheelchair with quick release belt in place and all needs within reach. Continue with current plan of care.     FIM:  Comprehension Comprehension Mode: Auditory Comprehension: 5-Understands basic 90% of the time/requires cueing < 10% of the time Expression Expression Mode: Verbal Expression: 3-Expresses basic 50 - 74% of the time/requires cueing 25 - 50% of the time. Needs to repeat parts of sentences. Social Interaction Social Interaction: 5-Interacts appropriately 90% of the time - Needs monitoring or encouragement for participation or interaction. Problem Solving Problem Solving: 3-Solves basic 50 - 74% of the time/requires cueing 25 - 49% of the time Memory Memory: 4-Recognizes or recalls 75 - 89% of the time/requires cueing 10 - 24% of the time  Pain Pain Assessment Pain Assessment: No/denies pain  Therapy/Group: Individual Therapy  Niesha Bame 07/12/2014, 3:47 PM

## 2014-07-12 NOTE — Progress Notes (Signed)
Subjective/Complaints: Had a quiet weekend. Is aware that she's going to SNF today. Denies pain, headache, sob, n/v/d/cp/swelling ROS limited by language  Objective: Vital Signs: Blood pressure 130/52, pulse 78, temperature 98.8 F (37.1 C), temperature source Oral, resp. rate 18, weight 81.3 kg (179 lb 3.7 oz), SpO2 98 %. No results found. No results found for this or any previous visit (from the past 72 hour(s)).   HEENT: left scalp wound healing nicely Cardio: RRR and no murmurs Resp: CTA B/L   GI: BS positive and nontender, nondistended Extremity:  Pulses positive and No Edema Skin:   Skin intact Neuro: very alert. More appropriate, Abnormal Sensory reduced sensation right upper extremity, left-sided strength is 4/5 in the upper and lower ex prox to distal. 0/5 right upper extremity prox to distal, 2/5 left HF, KE and tr to 0/5 ankle. Continued exp aphasia---expressive aphasia better, but limiting Musc/Skel:  Full PROM,   Gen. no acute distress. Very alert   Assessment/Plan: 1. Functional deficits secondary to left intracranial hemorrhage status post evacuation 05/20/2014 with right hemiplegia and aphasia which require 3+ hours per day of interdisciplinary therapy in a comprehensive inpatient rehab setting. Physiatrist is providing close team supervision and 24 hour management of active medical problems listed below. Physiatrist and rehab team continue to assess barriers to discharge/monitor patient progress toward functional and medical goals.  Placement at beginning of next week  FIM: FIM - Bathing Bathing Steps Patient Completed: Chest, Right Arm, Abdomen, Right upper leg, Left upper leg, Front perineal area Bathing: 3: Mod-Patient completes 5-7 45f 10 parts or 50-74%  FIM - Upper Body Dressing/Undressing Upper body dressing/undressing steps patient completed: Thread/unthread left bra strap, Thread/unthread left sleeve of pullover shirt/dress, Put head through opening of  pull over shirt/dress, Thread/unthread right bra strap, Thread/unthread right sleeve of pullover shirt/dresss, Pull shirt over trunk Upper body dressing/undressing: 4: Min-Patient completed 75 plus % of tasks FIM - Lower Body Dressing/Undressing Lower body dressing/undressing steps patient completed: Thread/unthread left pants leg, Don/Doff left sock, Don/Doff left shoe Lower body dressing/undressing: 2: Max-Patient completed 25-49% of tasks  FIM - Toileting Toileting steps completed by patient: Performs perineal hygiene Toileting Assistive Devices: Grab bar or rail for support Toileting: 2: Max-Patient completed 1 of 3 steps  FIM - Diplomatic Services operational officer Devices: Therapist, music Transfers: 3-To toilet/BSC: Mod A (lift or lower assist), 3-From toilet/BSC: Mod A (lift or lower assist)  FIM - Banker Devices: Bed rails Bed/Chair Transfer: 4: Supine > Sit: Min A (steadying Pt. > 75%/lift 1 leg), 3: Bed > Chair or W/C: Mod A (lift or lower assist), 3: Sit > Supine: Mod A (lifting assist/Pt. 50-74%/lift 2 legs), 3: Chair or W/C > Bed: Mod A (lift or lower assist)  FIM - Locomotion: Wheelchair Distance: 5 ft Locomotion: Wheelchair: 1: Total Assistance/staff pushes wheelchair (Pt<25%) FIM - Locomotion: Ambulation Locomotion: Ambulation Assistive Devices: Other (comment) (L hand rail) Ambulation/Gait Assistance: 3: Mod assist Locomotion: Ambulation: 1: Travels less than 50 ft with moderate assistance (Pt: 50 - 74%)  Comprehension Comprehension Mode: Auditory Comprehension: 5-Understands complex 90% of the time/Cues < 10% of the time  Expression Expression Mode: Verbal Expression: 2-Expresses basic 25 - 49% of the time/requires cueing 50 - 75% of the time. Uses single words/gestures.  Social Interaction Social Interaction: 5-Interacts appropriately 90% of the time - Needs monitoring or encouragement for participation or  interaction.  Problem Solving Problem Solving: 3-Solves basic 50 - 74% of the  time/requires cueing 25 - 49% of the time  Memory Memory: 4-Recognizes or recalls 75 - 89% of the time/requires cueing 10 - 24% of the time   Medical Problem List and Plan: 1. 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 DVT Prophylaxis/Anticoagulation: Subcutaneous heparin initiated 05/24/2014. No bleeding sequelae 3. Pain Management: Tylenol   effective 4. Seizure prophylaxis. Keppra 500 mg twice a day. EEG negative 5. Neuropsych: This patient remains incapable of making decisions on her own behalf. 6. Skin/Wound Care: Routine skin checks and pressure relief to continue at SNF 7. Fluids/Electrolytes/Nutrition:labs reviewed and normal. Intake remains  good 8. Dysphagia. Dysphagia #3 thin liquids continues   9. MRSA PCR screening positive. Contact precautions 10. Hypertension. Continue Toprol-XL 25 mg daily. Normotensive at present   11. Hypothyroidism. Synthroid 12. UTI--100k GNR--citrobacter---septra for 7 days  LOS (Days) 39 A FACE TO FACE EVALUATION WAS PERFORMED  Aly Seidenberg T 07/12/2014, 8:49 AM

## 2014-07-13 ENCOUNTER — Inpatient Hospital Stay (HOSPITAL_COMMUNITY): Payer: BLUE CROSS/BLUE SHIELD

## 2014-07-13 ENCOUNTER — Inpatient Hospital Stay (HOSPITAL_COMMUNITY): Payer: BLUE CROSS/BLUE SHIELD | Admitting: Speech Pathology

## 2014-07-13 ENCOUNTER — Inpatient Hospital Stay (HOSPITAL_COMMUNITY): Payer: BLUE CROSS/BLUE SHIELD | Admitting: Physical Therapy

## 2014-07-13 NOTE — Progress Notes (Signed)
Physical Therapy Session Note  Patient Details  Name: Carolyn [name] MRN: [id number] Date of Birth: 1945-06-10  Today's Date: 07/13/2014 PT Individual Time: 0900-0930 PT Individual Time Calculation (min): 30 min   Short Term Goals: Week 6:  PT Short Term Goal 1 (Week 6): = LTGs due to anticipated LOS  Skilled Therapeutic Interventions/Progress Updates:   Patient received semi reclined in bed, transferred supine > sit with max multimodal cues for sequencing and min A overall. Patient performed UB and LB dressing edge of bed with assist due to time constraints. Patient requested to use bathroom via [place] with multiple sit <> stands with supervision for transport from bed > toilet. Patient performed hygiene with supervision seated on commode and required assist for clothing management. Gait training using WBQC x 25 ft with mod A overall for upright posture and safe positioning of RLE, patient required no assist to stabilize RLE during stance phase or cues for safe sequencing this date. Patient left sitting in wheelchair with half lap tray in place and call bell within reach.    Therapy Documentation Precautions:  Precautions Precautions: Fall Precaution Comments: R field cut Restrictions Weight Bearing Restrictions: No Pain: Pain Assessment Pain Assessment: No/denies pain Locomotion : Ambulation Ambulation/Gait Assistance: 3: Mod assist   See FIM for current functional status  Therapy/Group: Individual Therapy  Kerney Elbe 07/13/2014, 9:33 AM

## 2014-07-13 NOTE — Progress Notes (Signed)
Speech Language Pathology Daily Session Note  Patient Details  Name: Carolyn [name] MRN: [id number] Date of Birth: 1945-10-31  Today's Date: 07/13/2014 SLP Individual Time: 1200-1230 SLP Individual Time Calculation (min): 30 min  Short Term Goals: Week 6: SLP Short Term Goal 1 (Week 6): Patient will complete verbal expression at the phrase level with structured tasks with Min A multimodal cues with 75% accuracy.  SLP Short Term Goal 2 (Week 6): Patient will self-monitor and correct verbal errors with Mod A multimodal cues.   Skilled Therapeutic Interventions: Skilled treatment session focused on dysphagia and language goals. SLP facilitated session by providing skilled observation with lunch meal of regular textures with thin liquids. Patient consumed limited amount of meal but without overt s/s of aspiration. Patient also demonstrated increased ability to verbalize her wants/needs (overall Min A) and initiated conversation with unfamiliar communication partner. Patient left upright in wheelchair with quick release belt in place and all needs within reach. Continue with current plan of care.    FIM:  Comprehension Comprehension Mode: Auditory Comprehension: 5-Understands basic 90% of the time/requires cueing < 10% of the time Expression Expression Mode: Verbal Expression: 3-Expresses basic 50 - 74% of the time/requires cueing 25 - 50% of the time. Needs to repeat parts of sentences. Social Interaction Social Interaction: 5-Interacts appropriately 90% of the time - Needs monitoring or encouragement for participation or interaction. Problem Solving Problem Solving: 3-Solves basic 50 - 74% of the time/requires cueing 25 - 49% of the time Memory Memory: 4-Recognizes or recalls 75 - 89% of the time/requires cueing 10 - 24% of the time FIM - Eating Eating Activity: 5: Set-up assist for cut food;5: Set-up assist for open containers  Pain Pain Assessment Pain Assessment: No/denies  pain  Therapy/Group: Individual Therapy  Taj Arteaga 07/13/2014, 4:20 PM

## 2014-07-13 NOTE — Progress Notes (Signed)
Subjective/Complaints: No new issues. Resting comfortably  ROS limited by language  Objective: Vital Signs: Blood pressure 123/54, pulse 71, temperature 98 F (36.7 C), temperature source Oral, resp. rate 17, weight 81.3 kg (179 lb 3.7 oz), SpO2 98 %. No results found. No results found for this or any previous visit (from the past 72 hour(s)).   HEENT: left scalp wound healing nicely Cardio: RRR and no murmurs Resp: CTA B/L   GI: BS positive and nontender, nondistended Extremity:  Pulses positive and No Edema Skin:   Skin intact Neuro: very alert. More appropriate, Abnormal Sensory reduced sensation right upper extremity, left-sided strength is 4/5 in the upper and lower ex prox to distal. 0/5 right upper extremity prox to distal, 2/5 left HF, KE and tr to 0/5 ankle. Continued exp aphasia---expressive aphasia, but limiting Musc/Skel:  Full PROM,   Gen. no acute distress. Very alert   Assessment/Plan: 1. Functional deficits secondary to left intracranial hemorrhage status post evacuation 05/20/2014 with right hemiplegia and aphasia which require 3+ hours per day of interdisciplinary therapy in a comprehensive inpatient rehab setting. Physiatrist is providing close team supervision and 24 hour management of active medical problems listed below. Physiatrist and rehab team continue to assess barriers to discharge/monitor patient progress toward functional and medical goals.  Placement today.   FIM: FIM - Bathing Bathing Steps Patient Completed: Chest, Right Arm, Abdomen, Right upper leg, Left upper leg, Front perineal area, Left lower leg (including foot) Bathing: 3: Mod-Patient completes 5-7 63f 10 parts or 50-74%  FIM - Upper Body Dressing/Undressing Upper body dressing/undressing steps patient completed: Thread/unthread left bra strap, Thread/unthread left sleeve of pullover shirt/dress, Put head through opening of pull over shirt/dress, Thread/unthread right bra strap,  Thread/unthread right sleeve of pullover shirt/dresss, Pull shirt over trunk Upper body dressing/undressing: 4: Min-Patient completed 75 plus % of tasks FIM - Lower Body Dressing/Undressing Lower body dressing/undressing steps patient completed: Thread/unthread left pants leg, Thread/unthread left underwear leg, Fasten/unfasten left shoe Lower body dressing/undressing: 2: Max-Patient completed 25-49% of tasks  FIM - Toileting Toileting steps completed by patient: Performs perineal hygiene Toileting Assistive Devices: Grab bar or rail for support Toileting: 2: Max-Patient completed 1 of 3 steps  FIM - Diplomatic Services operational officer Devices: Grab bars Toilet Transfers: 4-To toilet/BSC: Min A (steadying Pt. > 75%), 3-From toilet/BSC: Mod A (lift or lower assist)  FIM - Press photographer Assistive Devices: Arm rests Bed/Chair Transfer: 4: Supine > Sit: Min A (steadying Pt. > 75%/lift 1 leg), 4: Sit > Supine: Min A (steadying pt. > 75%/lift 1 leg), 3: Bed > Chair or W/C: Mod A (lift or lower assist), 3: Chair or W/C > Bed: Mod A (lift or lower assist)  FIM - Locomotion: Wheelchair Distance: 150; min A needed to assist with steering w/c with L foot. Locomotion: Wheelchair: 4: Travels 150 ft or more: maneuvers on rugs and over door sillls with minimal assistance (Pt.>75%) FIM - Locomotion: Ambulation Locomotion: Ambulation Assistive Devices: Occupational hygienist Ambulation/Gait Assistance: 2: Max assist Locomotion: Ambulation: 1: Travels less than 50 ft with maximal assistance (Pt: 25 - 49%)  Comprehension Comprehension Mode: Auditory Comprehension: 5-Understands basic 90% of the time/requires cueing < 10% of the time  Expression Expression Mode: Verbal Expression: 3-Expresses basic 50 - 74% of the time/requires cueing 25 - 50% of the time. Needs to repeat parts of sentences.  Social Interaction Social Interaction: 5-Interacts appropriately 90% of the time - Needs  monitoring or encouragement for  participation or interaction.  Problem Solving Problem Solving: 3-Solves basic 50 - 74% of the time/requires cueing 25 - 49% of the time  Memory Memory: 4-Recognizes or recalls 75 - 89% of the time/requires cueing 10 - 24% of the time   Medical Problem List and Plan: 1. 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 DVT Prophylaxis/Anticoagulation: Subcutaneous heparin initiated 05/24/2014. No bleeding sequelae 3. Pain Management: Tylenol   effective 4. Seizure prophylaxis. Keppra 500 mg twice a day. EEG negative 5. Neuropsych: This patient remains incapable of making decisions on her own behalf. 6. Skin/Wound Care: continue at SNF 7. Fluids/Electrolytes/Nutrition:labs reviewed and normal. Intake remains  good 8. Dysphagia. Dysphagia #3 thin liquids continues   9. MRSA PCR screening positive. Contact precautions 10. Hypertension. Continue Toprol-XL 25 mg daily. Normotensive at present   11. Hypothyroidism. Synthroid 12. UTI--100k GNR--citrobacter completed.   LOS (Days) 40 A FACE TO FACE EVALUATION WAS PERFORMED  SWARTZ,ZACHARY T 07/13/2014, 8:21 AM

## 2014-07-13 NOTE — Progress Notes (Signed)
Physical Therapy Session Note  Patient Details  Name: Carolyn [name] MRN: [id number] Date of Birth: 25-Sep-1945  Today's Date: 07/12/2014 PT Individual Time: 1030-1115 PT Individual Time Calculation (min): 45 min   Short Term Goals: Week 2:  PT Short Term Goal 1 (Week 2): Patient will perform bed mobility with assist of 1 person.  PT Short Term Goal 1 - Progress (Week 2): Met PT Short Term Goal 2 (Week 2): Patient will perform bed <> wheelchair transfer with mod A x 2.  PT Short Term Goal 2 - Progress (Week 2): Met PT Short Term Goal 3 (Week 2): Patient will perform dynamic sitting balance x 3 min with min A.  PT Short Term Goal 3 - Progress (Week 2): Met PT Short Term Goal 4 (Week 2): Patient will tolerate standing x 60 sec with +2 assist.  PT Short Term Goal 4 - Progress (Week 2): Progressing toward goal PT Short Term Goal 5 (Week 2): Patient will propel wheelchair x 50 ft with mod A.  PT Short Term Goal 5 - Progress (Week 2): Progressing toward goal (not focus of treatment) Week 3:  PT Short Term Goal 1 (Week 3): Patient will perform bed mobility with consistent mod A x1 with use of hospital bed functions.  PT Short Term Goal 1 - Progress (Week 3): Progressing toward goal PT Short Term Goal 2 (Week 3): Patient will perform bed <> wheelchair transfer with consistent max A x 1 with +2 to stabilize wheelchair.  PT Short Term Goal 2 - Progress (Week 3): Met PT Short Term Goal 3 (Week 3): Patient will tolerate standing x 60 sec with max A x 1.  PT Short Term Goal 3 - Progress (Week 3): Met PT Short Term Goal 4 (Week 3): Patient will ambulate 25 ft with assist as needed.  PT Short Term Goal 4 - Progress (Week 3): Met PT Short Term Goal 5 (Week 3): Patient will initiate stair training.  Week 4:  PT Short Term Goal 1 (Week 4): = LTGs due to anticipated LOS  Skilled Therapeutic Interventions/Progress Updates:   Session with focus on functional w/c mobility, transfers and bed mobility, gait  and stair negotiation.  Please see below for details.  Will continue to work towards LTG.   Therapy Documentation Precautions:  Precautions Precautions: Fall Precaution Comments: R field cut Restrictions Weight Bearing Restrictions: No Pain: Pain Assessment Pain Assessment: No/denies pain Mobility Bed Mobility Bed Mobility: Supine to Sit;Sit to Supine Supine to Sit: 4: Min assist Supine to Sit Details: Verbal cues for technique;Tactile cues for initiation;Tactile cues for sequencing;Visual cues/gestures for sequencing;Manual facilitation for weight shifting;Manual facilitation for placement Sit to Supine: 4: Min assist Sit to Supine - Details (indicate cue type and reason): Performed supine <> sit on flat mat, no rails with min A for management of RLE onto mat with verbal cues for "ear to pillow" to prevent trunk posterior rotation during sit > supine. Supine > sit with min A for roll to R side and min A for trunk control during side > sit; pt able to advance RLE off of mat during side > sit. Required max-total verbal cues for sequencing Transfers Transfers: Yes Attempted stand pivot transfers w/c > mat; required mod-max A to stand from w/c and to stabilize once standing with UE support on quad cane.  While pivoting pt required max-total A on R side for weight shifting, balance and stabilization in stance.  Pt experienced one major LOB while pivoting and sat  quickly on mat.  Returned to w/c to practice squat pivot transfers for more control of COG during pivot transfers. Squat Pivot Transfers: 3: Mod assist Squat Pivot Transfer Details (indicate cue type and reason): Pt performed multiple squat pivot transfers mat <> w/c to L and R with mod lifting assistance and to maintain squat during full pivot. Required over the back technique to maintain forward weight shift over BOS and max-total verbal cues for sequencing of transfer Locomotion  Ambulation Ambulation: Yes Ambulation/Gait  Assistance: 2: Max assist Ambulation Distance (Feet): 20 Feet Assistive device: Large base quad cane Ambulation/Gait Assistance Details: Performed gait x 20' in controlled environment with L WBQC and max A of one therapist on R side providing facilitation at trunk and pelvis for trunk control, weight shifting, RLE advancement and stabilization in stance and max-total verbal cues for sequencing Gait Gait: Yes Gait Pattern: Impaired Gait Pattern: Step-to pattern;Decreased step length - right;Decreased step length - left;Decreased stance time - right;Decreased stride length;Decreased hip/knee flexion - right;Decreased dorsiflexion - right;Decreased weight shift to left;Right genu recurvatum;Decreased trunk rotation;Trunk flexed;Narrow base of support Stairs / Additional Locomotion Stairs: Yes Stairs Assistance: 2: Max Armed forces technical officer Details (indicate cue type and reason): Performed up/down one step forwards to ascend, backwards to descend with UE support on L rail and max-total verbal cues for sequencing; max A needed for trunk control, weight shifting and RLE advancement and stabilization in stance. Stair Management Technique: One rail Left;Step to pattern;Forwards;Backwards Number of Stairs: 1 Height of Stairs: 6 Architect: Yes Wheelchair Assistance: 4: Energy manager: Left upper extremity;Left lower extremity Wheelchair Parts Management: Needs assistance Distance: 150; min A needed to assist with steering w/c with L foot.   See FIM for current functional status  Therapy/Group: Individual Therapy  Raylene Everts [facility] 07/13/2014, 12:25 PM

## 2014-07-13 NOTE — Progress Notes (Signed)
Physical Therapy Note  Patient Details  Name: Carolyn [name] MRN: [id number] Date of Birth: 1945/03/26 Today's Date: 07/13/2014    Pt's plan of care adjusted to daily therapies after speaking with care team and discussed with PA as pt continues to work towards LTG while awaiting placement at [facility].   Edman Circle Faucette 07/13/2014, 12:23 PM

## 2014-07-13 NOTE — Progress Notes (Signed)
Nutrition Follow-up  DOCUMENTATION CODES:  Obesity unspecified  INTERVENTION:  Magic cup, Snacks   Encourage adequate PO intake.   NUTRITION DIAGNOSIS:  Inadequate oral intake related to  (decreased appetite) as evidenced by  (varied meal completion of 25-90%); improving   GOAL:  Patient will meet greater than or equal to 90% of their needs; met  MONITOR:  PO intake, Supplement acceptance, Weight trends, Labs, I & O's  REASON FOR ASSESSMENT:  Consult Poor PO  ASSESSMENT: Pt with history of hypertension, recent status post AAA stent grafting. Admitted 05/20/2014 after after developing severe headache right arm weakness while at work and suddenly became unresponsive. Cranial CT scan showed left ICH with 3 mm midline shift. Underwent left frontoparietal craniotomy for evacuation of intracerebral hematoma 05/20/2014.  Meal completion has been varied from 50-100%, with most meals at 100%. Plans for pt to be discharged to SNF soon. Continue to encourage adequate PO intake.   Labs and medications reviewed.  Height:  Ht Readings from Last 1 Encounters:  05/20/14 _0  (1.626 m)    Weight:  Wt Readings from Last 1 Encounters:  07/07/14 179 lb 3.7 oz (81.3 kg)    Ideal Body Weight:  54.5 kg  Wt Readings from Last 10 Encounters:  07/07/14 179 lb 3.7 oz (81.3 kg)  06/03/14 199 lb 4.7 oz (90.4 kg)    BMI:  Body mass index is 30.75 kg/(m^2).  Estimated Nutritional Needs:  Kcal:  0762-2633  Protein:  85-95 grams  Fluid:  1.7 - 1.9 L/day  Skin:   (Incision on L head, non-piting RLE, RUE edema)  Diet Order:  Diet regular Room service appropriate?: Yes; Fluid consistency:: Thin  EDUCATION NEEDS:  No education needs identified at this time   Intake/Output Summary (Last 24 hours) at 07/13/14 1629 Last data filed at 07/13/14 1430  Gross per 24 hour  Intake    720 ml  Output      0 ml  Net    720 ml    Last BM:  6/20  Corrin Parker, MS, RD, LDN Pager #  [phone] After hours/ weekend pager # [phone]

## 2014-07-13 NOTE — Progress Notes (Signed)
Occupational Therapy Session Note  Patient Details  Name: Tashauna Ruman MRN: [id number] Date of Birth: [birth year]  Today's Date: 07/13/2014 OT Individual Time: 1100-1130 OT Individual Time Calculation (min): 30 min    Short Term Goals: Week 5:  OT Short Term Goal 1 (Week 5): Focus on LTGs due expected discharge date  Skilled Therapeutic Interventions/Progress Updates:    Pt seen for 1:1 OT session with focus on postural control, standing balance, functional transfers, and activity tolerance. Pt received sitting in w/c. Pt engaged in dynamic standing balance activity without UE support to simulate clothing management during toileting task. Pt required min-mod A for dynamic standing balance, with pt remaining in standing 30-45 sec before requiring rest break. Pt completed squat pivot transfer w/c<>mat table with min A and min cues for sequencing. Pt returned to room and left sitting in w/c with all needs in reach.   Therapy Documentation Precautions:  Precautions Precautions: Fall Precaution Comments: R field cut Restrictions Weight Bearing Restrictions: No General:   Vital Signs:  Pain: Pain Assessment Pain Assessment: No/denies pain  See FIM for current functional status  Therapy/Group: Individual Therapy  Daneil Dan 07/13/2014, 12:04 PM

## 2014-07-13 NOTE — Progress Notes (Signed)
Social Work Patient ID: Carolyn [name], female   DOB: 07/30/1945, 69 y.o.   MRN: [id number]   Delay continues with SNF placement due to insurance issues.  Hope to have clearance no later than Thursday.  Will keep team posted.  Arlynn Mcdermid, LCSW

## 2014-07-14 ENCOUNTER — Inpatient Hospital Stay (HOSPITAL_COMMUNITY): Payer: BLUE CROSS/BLUE SHIELD | Admitting: Speech Pathology

## 2014-07-14 ENCOUNTER — Inpatient Hospital Stay (HOSPITAL_COMMUNITY): Payer: BLUE CROSS/BLUE SHIELD

## 2014-07-14 ENCOUNTER — Inpatient Hospital Stay (HOSPITAL_COMMUNITY): Payer: BLUE CROSS/BLUE SHIELD | Admitting: Physical Therapy

## 2014-07-14 NOTE — Progress Notes (Signed)
Physical Therapy Discharge Summary  Patient Details  Name: Carolyn [name] MRN: [id number] Date of Birth: October 01, 1945  Patient has met 9 of 9 long term goals due to improved activity tolerance, improved balance, improved postural control, increased strength, increased range of motion, decreased pain, ability to compensate for deficits, functional use of  right lower extremity, improved attention, improved awareness and improved coordination.  Patient to discharge at a wheelchair level [place].   Patient's care partner at SNF trained to provide the necessary physical and cognitive assistance at discharge.  Reasons goals not met: NA  Recommendation:  Patient will benefit from ongoing skilled PT services in skilled nursing facility setting to continue to advance safe functional mobility, address ongoing impairments in RUE > RLE hemiplegia, muscle weakness, decreased standing balance, decreased balance reactions, pushing tendencies to R, decreased postural control, decreased endurance, impaired timing and sequencing, abnormal tone, unbalanced muscle activation, motor apraxia, decreased coordination, decreased motor planning, decreased sensation, decreased midline orientation, decreased attention to right, aphasia,decreased initiation, decreased attention, decreased awareness, decreased problem solving, decreased safety awareness, decreased memory, and minimize fall risk.  Equipment: No equipment provided  Reasons for discharge: treatment goals met and discharge from hospital  Patient/family agrees with progress made and goals achieved: Yes  PT Discharge Precautions/Restrictions Precautions Precautions: Fall Precaution Comments: R field cut Restrictions Weight Bearing Restrictions: No Pain Pain Assessment Pain Assessment: No/denies pain Vision/Perception  Vision - Assessment Eye Alignment: Within Functional Limits Ocular Range of Motion: Within Functional Limits Alignment/Gaze  Preference: Within Defined Limits Tracking/Visual Pursuits: Requires cues, head turns, or add eye shifts to track Saccades: Impaired - to be further tested in functional context Convergence: Impaired - to be further tested in functional context Diplopia Assessment: Other (comment) (frequently closes L eye) Additional Comments: R field cut  Cognition Overall Cognitive Status: Impaired/Different from baseline Arousal/Alertness: Awake/alert Orientation Level: Oriented X4 Attention: Sustained;Selective Sustained Attention: Appears intact Selective Attention: Appears intact Memory: Impaired Memory Impairment: Decreased recall of new information Awareness: Impaired Awareness Impairment: Emergent impairment Problem Solving: Impaired Problem Solving Impairment: Functional basic Initiating: Appears intact Safety/Judgment: Impaired Sensation Sensation Light Touch: Impaired Detail Light Touch Impaired Details: Absent RLE;Absent RUE Proprioception: Impaired Detail Proprioception Impaired Details: Absent RLE;Absent RUE Coordination Gross Motor Movements are Fluid and Coordinated: No Fine Motor Movements are Fluid and Coordinated: No Coordination and Movement Description: no AROM RUE Motor  Motor Motor: Hemiplegia;Motor apraxia;Abnormal postural alignment and control Motor - Discharge Observations: RUE > RLE hemiplegia  Mobility Bed Mobility Bed Mobility: Supine to Sit;Sit to Supine Supine to Sit: 4: Min assist Supine to Sit Details: Verbal cues for technique;Tactile cues for initiation;Tactile cues for sequencing;Visual cues/gestures for sequencing;Manual facilitation for weight shifting;Manual facilitation for placement Sit to Supine: 4: Min assist;HOB flat Sit to Supine - Details: Verbal cues for precautions/safety;Verbal cues for technique;Verbal cues for sequencing;Manual facilitation for placement Sit to Supine - Details (indicate cue type and reason): assist for  RLE Transfers Transfers: Yes Sit to Stand: 4: Min assist;With upper extremity assist Stand to Sit: 4: Min assist;With upper extremity assist Squat Pivot Transfers: 3: Mod assist;4: Min Risk manager Details: Verbal cues for technique;Manual facilitation for weight shifting Locomotion  Ambulation Ambulation: Yes Ambulation/Gait Assistance: 3: Mod assist Ambulation Distance (Feet): 30 Feet Assistive device: Large base quad cane Ambulation/Gait Assistance Details: therapist on R side providing facilitation at trunk and pelvis, weight shifting, and safe placement of RLE, no assist needed to stabilize RLE and no cues for sequencing Gait Gait: Yes Gait  Pattern: Impaired Gait Pattern: Step-to pattern;Decreased step length - right;Decreased step length - left;Decreased stance time - right;Decreased stride length;Decreased hip/knee flexion - right;Decreased dorsiflexion - right;Decreased weight shift to left;Right genu recurvatum;Decreased trunk rotation;Trunk flexed;Narrow base of support Gait velocity: decreased Stairs / Additional Locomotion Stairs: Yes Stairs Assistance: 2: Max assist Stair Management Technique: One rail Left;Step to pattern;Forwards;Backwards Number of Stairs: 2 Height of Stairs: 6 Ramp: Not tested (comment) Curb: Not tested (comment) Wheelchair Mobility Wheelchair Mobility: Yes Wheelchair Assistance: 4: Min Lexicographer: Left upper extremity;Left lower extremity Wheelchair Parts Management: Needs assistance Distance: 150 ft  Trunk/Postural Assessment  Cervical Assessment Cervical Assessment: Within Functional Limits Thoracic Assessment Thoracic Assessment: Exceptions to [facility] (kyphotic; R trunk elongated, L trunk shortened) Lumbar Assessment Lumbar Assessment: Exceptions to [facility] (posterior tilt) Postural Control Postural Control: Deficits on evaluation Head Control: improved Trunk Control: Continues to push in standing to R but also  advances COG anterior to BOS during gait Protective Responses: delayed  Balance Balance Balance Assessed: Yes Static Sitting Balance Static Sitting - Balance Support: Feet supported;Left upper extremity supported Static Sitting - Level of Assistance: 5: Stand by assistance Dynamic Sitting Balance Dynamic Sitting - Balance Support: Left upper extremity supported;Feet supported Dynamic Sitting - Level of Assistance: 5: Stand by assistance Static Standing Balance Static Standing - Balance Support: Right upper extremity supported;Left upper extremity supported Static Standing - Level of Assistance: 3: Mod assist;4: Min assist Dynamic Standing Balance Dynamic Standing - Balance Support: Right upper extremity supported;Left upper extremity supported Dynamic Standing - Level of Assistance: 3: Mod assist Extremity Assessment  RUE Assessment RUE Assessment: Exceptions to [facility] (inconsistent trace activation at shoulder and tricp with gravity assisted) RUE PROM (degrees) Overall PROM Right Upper Extremity: Within functional limits for tasks performed RUE Tone RUE Tone: Other (Comment) (inconsistent tone flexor tone at biceps) LUE Assessment LUE Assessment: Within Functional Limits RLE Assessment RLE Assessment: Exceptions to [facility] RLE Strength RLE Overall Strength: Deficits RLE Overall Strength Comments: 2-3/5 overall; no active DF noted LLE Assessment LLE Assessment: Within Functional Limits  See FIM for current functional status  Laretta Alstrom 07/14/2014, 1:21 PM

## 2014-07-14 NOTE — Progress Notes (Signed)
Physical Therapy Session Note  Patient Details  Name: Carolyn [name] MRN: [id number] Date of Birth: 1945-02-04  Today's Date: 07/14/2014 PT Individual Time: 0900-0945 PT Individual Time Calculation (min): 45 min   Short Term Goals: Week 6:  PT Short Term Goal 1 (Week 6): = LTGs due to anticipated LOS  Skilled Therapeutic Interventions/Progress Updates:   Patient semi reclined in bed, requesting to get dressed before getting OOB. Patient transferred supine > sit with HOB flat with max cues for sequencing and technique, min A to roll to L side and patient able to elevate trunk using LUE and scoot hips to edge of bed. Performed UB/LB dressing with sit <> stand edge of bed, min A. Patient propelled wheelchair using LUE with min A for steering 2 x 150 ft in controlled environment. Gait training using WBQC x 30 ft with mod A overall for upright posture and safe positioning of RLE, patient required no assist to stabilize RLE during stance phase or cues for sequencing with intermittent 2- and 3-point gait pattern. Stair training up/down two 6" steps using L rail, ascending forward leading with LLE and descending backward leading with LLE, no cues needed for sequencing and max A for placement/positioning of RLE. Patient performed standing step taps to 6" step using LLE to facilitate R knee extension, 2 x 5 with mod A overall. Patient left sitting in wheelchair with 1/2 lap tray and call bell within reach.   Therapy Documentation Precautions:  Precautions Precautions: Fall Precaution Comments: R field cut Restrictions Weight Bearing Restrictions: No Pain: Pain Assessment Pain Assessment: No/denies pain  See FIM for current functional status  Therapy/Group: Individual Therapy  Kerney Elbe 07/14/2014, 1:01 PM

## 2014-07-14 NOTE — Progress Notes (Signed)
Speech Language Pathology Session Note & Discharge Summary  Patient Details  Name: Carolyn [name] MRN: [id number] Date of Birth: 1945-03-23  Today's Date: 07/14/2014 SLP Individual Time: 1300-1315 SLP Individual Time Calculation (min): 15 min   Skilled Therapeutic Interventions:  Skilled treatment session focused on functional communication. SLP facilitated session by providing Min A question cues for recall of information and events from previous therapy sessions and d/c plan. During session, transport came to take patient to SNF and SLP assisted with transfer with the use of the [facility]. Patient left with transport.   Patient has met 8 of 8 long term goals.  Patient to discharge at overall Mod;Min;Supervision level.   Reasons goals not met: N/A   Clinical Impression/Discharge Summary: Patient has made functional gains and has met 9 of 9 LTG's this admission due to increased, verbal expression, auditory comprehension, swallowing function, orientation, attention, awareness and problem solving. Currently, patient is consuming regular textures with thin liquids without overt s/s of aspiration and is Mod I for use of swallowing compensatory strategies. Patient also requires supervision-Min A visual cues for auditory comprehension of basic information and Min-Mod A for verbal expression of wants/needs at the phrase level. Patient's verbal errors are characterized by phonemic paraphasias, perseveration and neologisms and patient can self-monitor errors with Min A multimodal cues but requires Mod-Max A multimodal cues to self-correct. Patient also requires overall Min A multimodal cues to for selective attention, emergent awareness, attention to right field of environment/body, recall of information and functional problem solving. Patient/family education is complete and patient's family is unable to provide the necessary physical and cognitive assistance needed at this time, therefore, patient will d/c to  a SNF. Patient would benefit from f/u SLP services to maximize her functional communication, cognitive function and overall functional independence.   Care Partner:  Caregiver Able to Provide Assistance: No  Type of Caregiver Assistance: Physical;Cognitive  Recommendation:  24 hour supervision/assistance;Skilled Nursing facility  Rationale for SLP Follow Up: Maximize functional communication;Maximize cognitive function and independence;Reduce caregiver burden   Equipment: N/A   Reasons for discharge: Treatment goals met;Discharged from hospital   Patient/Family Agrees with Progress Made and Goals Achieved: Yes   See FIM for current functional status  Kaicee Scarpino 07/14/2014, 3:54 PM

## 2014-07-14 NOTE — Patient Care Conference (Signed)
Inpatient RehabilitationTeam Conference and Plan of Care Update Date: 07/13/2014   Time: 3:00 PM    Patient Name: Carolyn [name]      Medical Record Number: [id number]  Date of Birth: 10-Sep-1945 Sex: Female         Room/Bed: 4W11C/4W11C-01 Payor Info: Payor: BLUE CROSS BLUE SHIELD / Plan: BCBS OTHER / Product Type: *No Product type* /    Admitting Diagnosis: NON TRAUMATIC ICH  Admit Date/Time:  06/03/2014  3:45 PM Admission Comments: No comment available   Primary Diagnosis:  Nontraumatic hemorrhage of cerebral hemisphere Principal Problem: Nontraumatic hemorrhage of cerebral hemisphere  Patient Active Problem List   Diagnosis Date Noted  . Embolic stroke   . HLD (hyperlipidemia)   . Aphasia   . Right hemiplegia   . Endotracheal tube present   . Stroke   . 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 Acute respiratory failure, unspecified whether with hypoxia or hypercapnia   . Acute respiratory failure   . Bradycardia 05/23/2014  . Cerebral brain hemorrhage   . Respiratory failure   . Junctional bradycardia   . Arterial hypotension   . Nontraumatic hemorrhage of cerebral hemisphere 05/20/2014  . Hypertensive emergency 05/20/2014    Expected Discharge Date: Expected Discharge Date:  (SNF)  Team Members Present: Physician leading conference: Dr. Faith Rogue Social Worker Present: Amada Jupiter, LCSW Nurse Present: Daryll Brod, RN PT Present: Bayard Hugger, PT OT Present: Ardis Rowan, Darolyn Rua, OT SLP Present: Feliberto Gottron, SLP PPS Coordinator present : Tora Duck, RN, CRRN     Current Status/Progress Goal Weekly Team Focus  Medical   eating well. slow,gradual changes  see prior  improve ambulation/locomotion   Bowel/Bladder   continent of bowel and bladder. has urgency, wears brief  manage bowel and bladder Mod assist  remain cont of bowel and bladder   Swallow/Nutrition/ Hydration   regular textures with thin liquids, intermittent supervision    Supervision  awaiting d/c to SNF   ADL's   mod A UB dressing, LB dressing, mod A functional transfers, total A toileting  mod A UB dressing, bathing, and standing balance, max A toileting, toilet transfer and LB dressing, min A grooming  standing balance, functional transfers, R NMR, postural control, activity tolerance, cognitive remediation   Mobility   S sitting balance, min A bed mob, mod A transfers/standing balance, max A gait x 20 ft using WBQC  min A w/c and bed mobility, mod A transfers and standing balance, max A gait/stairs with therapy only  functional ambulation, transfers, standing balance, postural control, midline, R NMR, R attention, activity tolerance, safety   Communication   Min A for comprehension and Min-Mod A for functional communication   Min-Mod A  awaiting d/c to SNF   Safety/Cognition/ Behavioral Observations  Min-Mod A  Min-Mod A  awaiting d/c to SNF   Pain   no c/o pain  pain less than 2  assess q shift and PRN   Skin   L scalp incision OTA, CD&I  remain free from skin breakbown while on Rehab  assess skin q shift and PRN    Rehab Goals Patient on target to meet rehab goals: Yes *See Care Plan and progress notes for long and short-term goals.  Barriers to Discharge: see prior    Possible Resolutions to Barriers:  SNF    Discharge Planning/Teaching Needs:  d/c plan is SNF - awaiting contact from insurance about coverage      Continues to make gains in ambulation and  overall functioning.  SW reports still awaiting insurance "ok" to transfer to SNF   Revisions to Treatment Plan:  None   Continued Need for Acute Rehabilitation Level of Care: The patient requires daily medical management by a physician with specialized training in physical medicine and rehabilitation for the following conditions: Daily direction of a multidisciplinary physical rehabilitation program to ensure safe treatment while eliciting the highest outcome that is of practical value to  the patient.: Yes Daily medical management of patient stability for increased activity during participation in an intensive rehabilitation regime.: Yes Daily analysis of laboratory values and/or radiology reports with any subsequent need for medication adjustment of medical intervention for : Neurological problems;Other  Carolyn [name] 07/14/2014, 8:31 AM

## 2014-07-14 NOTE — Progress Notes (Signed)
Social Work  Discharge Note  The overall goal for the admission was met for:   Discharge location: No - plan changed to SNF due to extensive care needs that family unable to meet  Length of Stay: No - extended due to change in d/c plan and awaiting insurance approval = 31 days  Discharge activity level: Yes - mod to max assistance overall  Home/community participation: Yes  Services provided included: MD, RD, PT, OT, SLP, RN, TR, Pharmacy and SW  Financial Services: Medicare and Private Insurance: BCBS of [place] (primary)  Follow-up services arranged: Other: SNF @ [facility]  Comments (or additional information):  Patient/Family verbalized understanding of follow-up arrangements: Yes  Individual responsible for coordination of the follow-up plan: pt and daughter  Confirmed correct DME delivered: NA    Pearse Shiffler

## 2014-07-14 NOTE — Progress Notes (Signed)
Subjective/Complaints: No new problems. Had a good night ROS limited by language  Objective: Vital Signs: Blood pressure 115/61, pulse 72, temperature 98 F (36.7 C), temperature source Oral, resp. rate 17, weight 81.3 kg (179 lb 3.7 oz), SpO2 99 %. No results found. No results found for this or any previous visit (from the past 72 hour(s)).   HEENT: left scalp wound healing nicely Cardio: RRR and no murmurs Resp: CTA B/L   GI: BS positive and nontender, nondistended Extremity:  Pulses positive and No Edema Skin:   Skin intact Neuro: very alert. More appropriate, Abnormal Sensory reduced sensation right upper extremity, left-sided strength is 4/5 in the upper and lower ex prox to distal. 0/5 right upper extremity prox to distal, 2/5 left HF, KE and tr to 0/5 ankle. Continued exp aphasia  Musc/Skel:  Full PROM,   Gen. no acute distress. Very alert   Assessment/Plan: 1. Functional deficits secondary to left intracranial hemorrhage status post evacuation 05/20/2014 with right hemiplegia and aphasia which require 3+ hours per day of interdisciplinary therapy in a comprehensive inpatient rehab setting. Physiatrist is providing close team supervision and 24 hour management of active medical problems listed below. Physiatrist and rehab team continue to assess barriers to discharge/monitor patient progress toward functional and medical goals.  Placement today?????  FIM: FIM - Bathing Bathing Steps Patient Completed: Chest, Right Arm, Abdomen, Right upper leg, Left upper leg, Front perineal area, Left lower leg (including foot) Bathing: 3: Mod-Patient completes 5-7 71f 10 parts or 50-74%  FIM - Upper Body Dressing/Undressing Upper body dressing/undressing steps patient completed: Thread/unthread left bra strap, Thread/unthread left sleeve of pullover shirt/dress, Put head through opening of pull over shirt/dress, Thread/unthread right bra strap, Thread/unthread right sleeve of pullover  shirt/dresss, Pull shirt over trunk Upper body dressing/undressing: 4: Min-Patient completed 75 plus % of tasks FIM - Lower Body Dressing/Undressing Lower body dressing/undressing steps patient completed: Thread/unthread left pants leg, Thread/unthread left underwear leg, Fasten/unfasten left shoe Lower body dressing/undressing: 2: Max-Patient completed 25-49% of tasks  FIM - Toileting Toileting steps completed by patient: Performs perineal hygiene Toileting Assistive Devices: Grab bar or rail for support Toileting: 2: Max-Patient completed 1 of 3 steps  FIM - Diplomatic Services operational officer Devices: Grab bars Toilet Transfers: 4-To toilet/BSC: Min A (steadying Pt. > 75%), 3-From toilet/BSC: Mod A (lift or lower assist)  FIM - Press photographer Assistive Devices: Arm rests Bed/Chair Transfer: 4: Supine > Sit: Min A (steadying Pt. > 75%/lift 1 leg), 3: Bed > Chair or W/C: Mod A (lift or lower assist)  FIM - Locomotion: Wheelchair Distance: 150; min A needed to assist with steering w/c with L foot. Locomotion: Wheelchair: 1: Total Assistance/staff pushes wheelchair (Pt<25%) FIM - Locomotion: Ambulation Locomotion: Ambulation Assistive Devices: Occupational hygienist Ambulation/Gait Assistance: 3: Mod assist Locomotion: Ambulation: 1: Travels less than 50 ft with moderate assistance (Pt: 50 - 74%)  Comprehension Comprehension Mode: Auditory Comprehension: 5-Understands complex 90% of the time/Cues < 10% of the time  Expression Expression Mode: Verbal Expression: 2-Expresses basic 25 - 49% of the time/requires cueing 50 - 75% of the time. Uses single words/gestures.  Social Interaction Social Interaction: 5-Interacts appropriately 90% of the time - Needs monitoring or encouragement for participation or interaction.  Problem Solving Problem Solving: 3-Solves basic 50 - 74% of the time/requires cueing 25 - 49% of the time  Memory Memory: 4-Recognizes or  recalls 75 - 89% of the time/requires cueing 10 - 24% of the  time   Medical Problem List and Plan: 1. 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 DVT Prophylaxis/Anticoagulation: Subcutaneous heparin initiated 05/24/2014. No bleeding sequelae 3. Pain Management: Tylenol   effective 4. Seizure prophylaxis. Keppra 500 mg twice a day. EEG negative 5. Neuropsych: This patient remains incapable of making decisions on her own behalf. 6. Skin/Wound Care: continue at SNF 7. Fluids/Electrolytes/Nutrition:labs reviewed and normal. Intake remains  good 8. Dysphagia. Dysphagia #3 thin liquids continues   9. MRSA PCR screening positive. Contact precautions 10. Hypertension. Continue Toprol-XL 25 mg daily. Normotensive at present   11. Hypothyroidism. Synthroid 12. UTI--100k GNR--citrobacter completed.   LOS (Days) 41 A FACE TO FACE EVALUATION WAS PERFORMED  Carolyn [name] T 07/14/2014, 8:50 AM

## 2014-07-15 ENCOUNTER — Non-Acute Institutional Stay (SKILLED_NURSING_FACILITY): Payer: BLUE CROSS/BLUE SHIELD | Admitting: Adult Health

## 2014-07-15 ENCOUNTER — Encounter: Payer: Self-pay | Admitting: Adult Health

## 2014-07-15 DIAGNOSIS — N390 Urinary tract infection, site not specified: Secondary | ICD-10-CM | POA: Diagnosis not present

## 2014-07-15 DIAGNOSIS — Z418 Encounter for other procedures for purposes other than remedying health state: Secondary | ICD-10-CM | POA: Diagnosis not present

## 2014-07-15 DIAGNOSIS — I612 Nontraumatic intracerebral hemorrhage in hemisphere, unspecified: Secondary | ICD-10-CM

## 2014-07-15 DIAGNOSIS — I10 Essential (primary) hypertension: Secondary | ICD-10-CM

## 2014-07-15 DIAGNOSIS — Z298 Encounter for other specified prophylactic measures: Secondary | ICD-10-CM

## 2014-07-15 DIAGNOSIS — K5900 Constipation, unspecified: Secondary | ICD-10-CM | POA: Diagnosis not present

## 2014-07-15 DIAGNOSIS — R1310 Dysphagia, unspecified: Secondary | ICD-10-CM | POA: Diagnosis not present

## 2014-07-15 DIAGNOSIS — F329 Major depressive disorder, single episode, unspecified: Secondary | ICD-10-CM

## 2014-07-15 DIAGNOSIS — F32A Depression, unspecified: Secondary | ICD-10-CM

## 2014-07-15 DIAGNOSIS — K219 Gastro-esophageal reflux disease without esophagitis: Secondary | ICD-10-CM | POA: Diagnosis not present

## 2014-07-15 DIAGNOSIS — E039 Hypothyroidism, unspecified: Secondary | ICD-10-CM

## 2014-07-15 NOTE — Progress Notes (Signed)
Patient ID: Carolyn [name], female   DOB: 09-03-1945, 69 y.o.   MRN: [id number]   07/15/2014  Facility:  Nursing Home Location:  [facility] Room Number: 1201-P LEVEL OF CARE:  SNF (31)   Chief Complaint  Patient presents with  . Hospitalization Follow-up    Nontraumatic intracerebral hemorrhage S/P frontoparietal craniotomy, hypertension, hypothyroidism, depression, seizure prophylaxis, GERD, UTI and dysphagia    HISTORY OF PRESENT ILLNESS:  This is a 69 year old female was being admitted to [facility] on 07/14/14 from [facility] rehabilitation. She has PMH of hypertension, recent abdominal aortic aneurysm stenting at [facility] and in pain on aspirin and Plavix. She had severe headache on 05/20/14, right arm weakness while at work and suddenly became unresponsive. BP was 241/123. Cranial CT scan showed left ICH with a 3 mm midline shift. She was intubated and had left fronto parietal craniotomy, evacuation of intracerebral hematoma on 05/20/14. Nasogastric tube was inserted for nutritional support. She was put on Keppra for seizure prophylaxis. EEG was negative. Follow-up CT showed stable appearance of residual left frontoparietal hemorrhage. There were areas of acute subacute nonhemorrhagic infarct within the occipital lobes bilaterally. MR angiogram of the head and neck showed 60% stenosis on the proximal left ICA artery. Moderate to high-grade stenosis proximal right vertebral artery. Neurology was consulted and was placed on aspirin 81 mg by mouth daily as well as initiation of subcutaneous heparin. She was extubated and put on dysphagia 2 nectar thick liquid diet.  She has been admitted for further short-term rehabilitation.  PAST MEDICAL HISTORY:  Past Medical History  Diagnosis Date  . Hypertension   . AAA (abdominal aortic aneurysm)     a. s/p stent grafting @ [facility].  . Hemorrhagic cerebrovascular accident (CVA)     a.  05/20/2014 L hemispheric hemorrhagic CVA s/p decompressive craniotomy and evacuation.  . H/O echocardiogram     a. 04/2014 Echo: EF 65-70%, Gr 1 DD, mild LVH.    CURRENT MEDICATIONS: Reviewed per MAR/see medication list  Allergies  Allergen Reactions  . Statins Other (See Comments)    reductis inhibitors  . Adhesive [Tape] Rash  . Latex Rash  . Other Rash    Medical tape and bandaids     REVIEW OF SYSTEMS:  Difficult to obtain due to expressive aphasia  PHYSICAL EXAMINATION  GENERAL: no acute distress, obese SKIN:  Left scalp wound healing, dry EYES: conjunctivae normal, sclerae normal, normal eye lids NECK: supple, trachea midline, no neck masses, no thyroid tenderness, no thyromegaly LYMPHATICS: no LAN in the neck, no supraclavicular LAN RESPIRATORY: breathing is even & unlabored, BS CTAB CARDIAC: RRR, no murmur,no extra heart sounds, no edema GI: abdomen soft, normal BS, no masses, no tenderness, no hepatomegaly, no splenomegaly EXTREMITIES:  LUE and LLE 4/5 strength; RUE and RLE 0/5 strength NEURO:  Has expressive aphasia and right hemiplegia PSYCHIATRIC: the patient is alert, affect & behavior appropriate   LABS/RADIOLOGY: Labs reviewed: Basic Metabolic Panel:  Recent Labs  40/98/11 0520  06/02/14 0441 06/03/14 0414 06/04/14 0540 07/04/14 0703  NA 140  < > 142 139 138 140  K 3.9  < > 3.1* 3.7 3.6 3.7  CL 100*  < > 107 104 102 100*  CO2 30  < > 24 27 28 28   GLUCOSE 121*  < > 90 96 96 104*  BUN 21*  < > 16 13 12 9   CREATININE 0.50  < > 0.54 0.60 0.62 0.71  CALCIUM 9.3  < > 8.2* 9.1 9.2 9.3  MG 2.2  --  2.0 2.0  --   --   PHOS 4.0  --  3.5 3.9  --   --   < > = values in this interval not displayed. Liver Function Tests:  Recent Labs  05/20/14 1343 05/21/14 0400 06/04/14 0540  AST 28 73* 25  ALT 29 34 23  ALKPHOS 125* 108 99  BILITOT 0.6 1.1 0.4  PROT 6.7 6.6 6.7  ALBUMIN 3.9 3.6 3.1*   CBC:  Recent Labs  05/28/14 0425 05/29/14 0510   06/03/14 0414 06/04/14 0540 07/04/14 0703  WBC 12.2* 13.0*  < > 13.7* 13.5* 7.8  NEUTROABS 8.7* 9.3*  --   --  9.7*  --   HGB 10.3* 10.6*  < > 11.2* 11.4* 11.2*  HCT 33.8* 34.2*  < > 36.3 36.3 36.0  MCV 87.1 86.6  < > 85.6 85.4 86.1  PLT 273 306  < > 334 331 301  < > = values in this interval not displayed.  Lipid Panel:  Recent Labs  05/21/14 1048  HDL 61   CBG:  Recent Labs  05/30/14 2342 05/31/14 0412 05/31/14 0751  GLUCAP 90 99 99    Dg Chest Port 1 View  06/03/2014   CLINICAL DATA:  69 year old hypertensive female with hypoxia. Subsequent encounter.  EXAM: PORTABLE CHEST - 1 VIEW  COMPARISON:  06/01/2010.  FINDINGS: Left PICC line tip mid to distal superior vena cava level. No gross pneumothorax.  Mild rotation.  Heart slightly enlarged.  Calcified tortuous aorta.  Poor inspiration.  Central pulmonary vascular prominence.  No segmental consolidation.  IMPRESSION: Mild central pulmonary vascular prominence.  No segmental consolidation.  Calcified tortuous aorta.   Electronically Signed   By: Lacy Duverney M.D.   On: 06/03/2014 07:17   EXAM: MRI HEAD WITHOUT AND WITH CONTRAST   MRA HEAD WITHOUT CONTRAST   MRA NECK WITHOUT AND WITH CONTRAST   TECHNIQUE: Multiplanar, multiecho pulse sequences of the brain and surrounding structures were obtained without and with intravenous contrast. Angiographic images of the Circle of Willis were obtained using MRA technique without intravenous contrast. Angiographic images of the neck were obtained using MRA technique without and with intravenous contrast. Carotid stenosis measurements (when applicable) are obtained utilizing NASCET criteria, using the distal internal carotid diameter as the denominator.   CONTRAST:  28mL MULTIHANCE GADOBENATE DIMEGLUMINE 529 MG/ML IV SOLN   COMPARISON:  CT of the head 5/4/6 stain, 05/23/2014, and 05/21/2014.   FINDINGS: MRI HEAD FINDINGS   A left frontoparietal craniotomy is noted with  evacuation of the left-sided hemorrhage. The area of residual hemorrhage is similar to the prior studies, measuring 3.4 x 5.9 x 3.2 cm. There is surrounding vasogenic edema. There is also enhancement surrounding this hemorrhage.   The postcontrast images demonstrate intravascular enhancement throughout the posterior temporal lobes, occipital lobes, and bilateral parietal lobes, left greater than right.   Areas of acute nonhemorrhagic infarction are present within the posterior left temporal lobe and bilateral occipital lobes, left greater than right. A punctate focus is present within the medial posterior left frontal lobe. Acute nonhemorrhagic infarct is present at the left globus pallidus.   The residual hemorrhage continues to produce some mass effect on the left lateral ventricle. There is 3 mm of residual left-to-right midline shift. Layering blood products are noted within the lateral ventricles bilaterally without hydrocephalus.   Subarachnoid blood is again noted over the right temporal  convexity. T2 changes of the CSF near the vertex bilaterally may be related to blood products. This can be seen in the setting of high oxygen tension as well, more likely in this case.   Flow is present in the major intracranial arteries. The globes and orbits are intact. The paranasal sinuses are clear. There is some fluid in the right mastoid air cells. No obstructing nasopharyngeal lesion is present.   Midline structures are otherwise within normal limits.   MRA HEAD FINDINGS   Internal carotid arteries demonstrate mild atherosclerotic irregularity within the cavernous segments bilaterally, worse on the left. There is no significant stenosis. The terminal ICA is normal bilaterally. There is mild narrowing of the A1 segments bilaterally without a significant stenosis. The anterior communicating artery is patent. The M1 segments are normal. The MCA bifurcations are unremarkable. There  is mild attenuation of MCA branch vessels bilaterally.   More moderate segmental attenuation is present within both vertebral arteries. The right vertebral artery essentially bifurcates at the PICA. The vertebrobasilar junction is normal. The basilar artery is small. The right posterior cerebral artery is of fetal type. The left posterior cerebral artery originates from the basilar tip. There is moderate attenuation of the PCA branch vessels bilaterally.   MRA NECK FINDINGS   The time-of-flight images demonstrate narrowing of the left internal carotid artery. There is no significant flow disturbance at the right carotid bifurcation. Flow is antegrade in the vertebral arteries bilaterally.   Both vertebral arteries originate from the subclavian arteries. There is a moderate to high-grade stenosis of proximal right vertebral artery. No other significant stenoses are evident. Postcontrast imaging of the vertebrobasilar junction appears more normal than the time-of-flight images.   The right common carotid artery is within normal limits. There is focal plaque at the right carotid bifurcation without a significant stenosis. Mild tortuosity is present in the cervical right ICA without significant stenosis.   The left common carotid artery is within normal limits. Moderate stenosis is present in the proximal right internal carotid artery with an associated ulcerative plaque. The lumen is narrowed to 1.8 mm this compares with a more normal distal caliber of 4.1 mm.   IMPRESSION: 1. Stable appearance of residual left frontal and parietal hemorrhage following the craniotomy. 2. Areas of acute/subacute nonhemorrhagic infarct within the occipital lobes bilaterally, the posterior left temporal lobe, and the left globus pallidus. 3. 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 The configuration suggests this could be an on stable plaque. 4.  Moderate to high-grade stenosis of the proximal right vertebral artery without a significant distal stenosis. 5. Mild atherosclerotic changes at the right carotid bifurcation and bilateral cavernous internal carotid arteries without other stenoses. 6. Subarachnoid hemorrhage over the right temporal lobe. 7. Intravascular enhancement in the posterior circulation territories suggests luxury perfusion or less likely meningitis. 8. Nonspecific enhancement surrounding the area of hemorrhage likely related to the recent surgery.   Electronically Signed   By: Marin Roberts M.D.   On: 05/26/2014 13:53    ASSESSMENT/PLAN:  Nontraumatic intra-cerebral hemorrhage S/P left frontoparietal craniotomy - for rehabilitation; follow-up with Dr. Marny Lowenstein, outpatient rehabilitation services, on 09/07/14, Dr. Manual Meier, neurosurgery and Dr. Roda Shutters, neurology; continue aspirin 81 mg by mouth daily  Seizure prophylaxis - continue Keppra 500 mg 1 by mouth twice a day  Depression - continue Celexa 10 mg 1 tab by mouth daily  Hypothyroidism - TSH 0.825; continue Synthroid 75 g 1 tab by mouth  daily  Hypertension - continue Lopressor 12.5 mg by mouth twice a day  GERD - continue Protonix 40 mg by mouth daily  UTI - continue Bactrim 400/80 mg 1 tab by mouth every 12 hours 3 days  Dysphagia -  speech therapy for aphasi, cognition and dysphagia; continue dysphagia 3 with thin liquids diet  Constipation - start senna S2 tabs by mouth twice a day and MiraLAX 17 g +4-6 ounces liquid by mouth twice a day    Goals of care:  Short-term rehabilitation   Labs/test ordered:  BMP and CBC   Spent 50 minutes in patient care.     [facility], NP [employer] [phone]

## 2014-07-16 ENCOUNTER — Non-Acute Institutional Stay: Payer: BLUE CROSS/BLUE SHIELD | Admitting: Internal Medicine

## 2014-07-16 DIAGNOSIS — K219 Gastro-esophageal reflux disease without esophagitis: Secondary | ICD-10-CM

## 2014-07-16 DIAGNOSIS — I714 Abdominal aortic aneurysm, without rupture, unspecified: Secondary | ICD-10-CM

## 2014-07-16 DIAGNOSIS — I69921 Dysphasia following unspecified cerebrovascular disease: Secondary | ICD-10-CM

## 2014-07-16 DIAGNOSIS — I10 Essential (primary) hypertension: Secondary | ICD-10-CM | POA: Diagnosis not present

## 2014-07-16 DIAGNOSIS — I6340 Cerebral infarction due to embolism of unspecified cerebral artery: Secondary | ICD-10-CM | POA: Diagnosis not present

## 2014-07-16 DIAGNOSIS — D62 Acute posthemorrhagic anemia: Secondary | ICD-10-CM | POA: Diagnosis not present

## 2014-07-16 DIAGNOSIS — E039 Hypothyroidism, unspecified: Secondary | ICD-10-CM

## 2014-07-16 DIAGNOSIS — I612 Nontraumatic intracerebral hemorrhage in hemisphere, unspecified: Secondary | ICD-10-CM

## 2014-07-16 DIAGNOSIS — I639 Cerebral infarction, unspecified: Secondary | ICD-10-CM

## 2014-07-16 DIAGNOSIS — K5900 Constipation, unspecified: Secondary | ICD-10-CM

## 2014-07-16 DIAGNOSIS — G8191 Hemiplegia, unspecified affecting right dominant side: Secondary | ICD-10-CM

## 2014-07-16 DIAGNOSIS — IMO0002 Reserved for concepts with insufficient information to code with codable children: Secondary | ICD-10-CM

## 2014-07-16 DIAGNOSIS — G8190 Hemiplegia, unspecified affecting unspecified side: Secondary | ICD-10-CM | POA: Diagnosis not present

## 2014-07-16 DIAGNOSIS — N3000 Acute cystitis without hematuria: Secondary | ICD-10-CM | POA: Diagnosis not present

## 2014-07-16 DIAGNOSIS — I69320 Aphasia following cerebral infarction: Secondary | ICD-10-CM | POA: Diagnosis not present

## 2014-07-16 DIAGNOSIS — I69391 Dysphagia following cerebral infarction: Secondary | ICD-10-CM

## 2014-07-16 NOTE — Progress Notes (Signed)
Patient ID: Elbert Polyakov, female   DOB: 1945-12-25, 69 y.o.   MRN: [id number]     [facility]   PCP: Rosario Adie, MD  Code Status: full code  Allergies  Allergen Reactions  . Statins Other (See Comments)    reductis inhibitors  . Adhesive [Tape] Rash  . Latex Rash  . Other Rash    Medical tape and bandaids    Chief Complaint  Patient presents with  . New Admit To SNF     HPI:  69 year old patient is here for short term rehabilitation post hospital admission from 05/20/14-06/03/14 amd inpatient rehabilitation centre from 06/03/14-07/14/14 with unresponsiveness post severe headache and right arm weakness. She was diagnosed to have left intracranial hemorrhage with a 3 mm midline shift. She underwent left fronto-parietal craniotomy and evacuation of intracerebral hematoma. She was followed closely by neurology and neurosurgery, maintained on keppra for seizure prophylaxis.  EEG was negative for seizure activity and repeat imaging showed stable appearance of residual Left frontal parietal hemorrhage s/p craniotomy.  she was then transferred to inpatient rehab centre and worked with PT, OT and SLP team there. She has PMH of hypertension, abdominal aortic aneurysm s/p stent. She is seen in her room today. She denies any concerns. She has been working with therapy team. No new concern from nursing.    Review of Systems:  Constitutional: Negative for fever, chills, diaphoresis.  HENT: Negative for headache, congestion, nasal discharge, hearing loss Eyes: Negative for eye pain, blurred vision, double vision and discharge.  Respiratory: Negative for cough, shortness of breath and wheezing.   Cardiovascular: Negative for chest pain, palpitations, leg swelling.  Gastrointestinal: Negative for heartburn, nausea, vomiting, abdominal pain. Had bowel movement today Genitourinary: Negative for dysuria and flank pain.  Musculoskeletal: Negative for back pain,  falls Skin: Negative for itching, rash.  Neurological: Negative for dizziness, tingling, numbness Psychiatric/Behavioral: Negative for depression   Past Medical History  Diagnosis Date  . Hypertension   . AAA (abdominal aortic aneurysm)     a. s/p stent grafting @ [facility].  . Hemorrhagic cerebrovascular accident (CVA)     a. 05/20/2014 L hemispheric hemorrhagic CVA s/p decompressive craniotomy and evacuation.  . H/O echocardiogram     a. 04/2014 Echo: EF 65-70%, Gr 1 DD, mild LVH.   Past Surgical History  Procedure Laterality Date  . Abdominal aortic aneurysm repair    . Craniotomy Left 05/20/2014    Procedure: CRANIOTOMY HEMATOMA EVACUATION;  Surgeon: Aliene Beams, MD;  Location: [facility];  Service: Neurosurgery;  Laterality: Left;   Social History:   reports that she quit smoking about 9 years ago. She does not have any smokeless tobacco history on file. She reports that she drinks alcohol. She reports that she does not use illicit drugs.  No family history on file.  Medications: Patient's Medications  New Prescriptions   No medications on file  Previous Medications   ASPIRIN EC 81 MG TABLET    Take 81 mg by mouth daily.   CITALOPRAM (CELEXA) 10 MG TABLET    Take 10 mg by mouth daily.   LEVETIRACETAM (KEPPRA) 500 MG TABLET    Take 500 mg by mouth 2 (two) times daily.   LEVOTHYROXINE (SYNTHROID, LEVOTHROID) 75 MCG TABLET    Take 75 mcg by mouth daily before breakfast.   METOPROLOL TARTRATE (LOPRESSOR) 25 MG TABLET    Take 12.5 mg by mouth 2 (two) times daily.   MULTIPLE  VITAMIN (MULTIVITAMIN WITH MINERALS) TABS TABLET    Take 1 tablet by mouth daily.   PANTOPRAZOLE (PROTONIX) 40 MG TABLET    Take 40 mg by mouth daily.   POLYETHYLENE GLYCOL (MIRALAX / GLYCOLAX) PACKET    Take 17 g by mouth 2 (two) times daily.   SENNOSIDES-DOCUSATE SODIUM (SENOKOT-S) 8.6-50 MG TABLET    Take 2 tablets by mouth 2 (two) times daily.   SULFAMETHOXAZOLE-TRIMETHOPRIM (BACTRIM,SEPTRA) 400-80  MG PER TABLET    Take 1 tablet by mouth 2 (two) times daily.  Modified Medications   No medications on file  Discontinued Medications   CLOPIDOGREL (PLAVIX) 75 MG TABLET    Take 75 mg by mouth daily.   HYDROCODONE-ACETAMINOPHEN (NORCO/VICODIN) 5-325 MG PER TABLET    Take 1 tablet by mouth every 4 (four) hours as needed. for pain   METOPROLOL SUCCINATE (TOPROL-XL) 25 MG 24 HR TABLET    Take 25 mg by mouth daily.     Physical Exam: Filed Vitals:   07/16/14 1424  BP: 120/60  Pulse: 79  Temp: 97.4 F (36.3 C)  Resp: 18  Height: 5\' 4"  (1.626 m)  Weight: 197 lb 9.6 oz (89.631 kg)  SpO2: 96%   Body mass index is 33.9 kg/(m^2).  General- elderly female, obese, in no acute distress Head- normocephalic, atraumatic Throat- moist mucus membrane Eyes- no pallor, no icterus, no discharge, normal conjunctiva, normal sclera Neck- no cervical lymphadenopathy, no jugular vein distension Cardiovascular- normal s1,s2, no murmurs, palpable dorsalis pedis and radial pulses, no leg edema Respiratory- bilateral clear to auscultation, no wheeze, no rhonchi, no crackles, no use of accessory muscles Abdomen- bowel sounds present, soft, non tender Musculoskeletal- right sided hemiplegia, able to move her left side Neurological- some aphasia noted, right sided arm and leg weakness Skin- warm and dry, surgical incision on left scalp healing well Psychiatry- alert and oriented to person and place, normal mood and affect    Labs reviewed: Basic Metabolic Panel:  Recent Labs  70/48/88 0520  06/02/14 0441 06/03/14 0414 06/04/14 0540 07/04/14 0703  NA 140  < > 142 139 138 140  K 3.9  < > 3.1* 3.7 3.6 3.7  CL 100*  < > 107 104 102 100*  CO2 30  < > 24 27 28 28   GLUCOSE 121*  < > 90 96 96 104*  BUN 21*  < > 16 13 12 9   CREATININE 0.50  < > 0.54 0.60 0.62 0.71  CALCIUM 9.3  < > 8.2* 9.1 9.2 9.3  MG 2.2  --  2.0 2.0  --   --   PHOS 4.0  --  3.5 3.9  --   --   < > = values in this interval not  displayed. Liver Function Tests:  Recent Labs  05/20/14 1343 05/21/14 0400 06/04/14 0540  AST 28 73* 25  ALT 29 34 23  ALKPHOS 125* 108 99  BILITOT 0.6 1.1 0.4  PROT 6.7 6.6 6.7  ALBUMIN 3.9 3.6 3.1*   No results for input(s): LIPASE, AMYLASE in the last 8760 hours. No results for input(s): AMMONIA in the last 8760 hours. CBC:  Recent Labs  05/28/14 0425 05/29/14 0510  06/03/14 0414 06/04/14 0540 07/04/14 0703  WBC 12.2* 13.0*  < > 13.7* 13.5* 7.8  NEUTROABS 8.7* 9.3*  --   --  9.7*  --   HGB 10.3* 10.6*  < > 11.2* 11.4* 11.2*  HCT 33.8* 34.2*  < > 36.3 36.3 36.0  MCV  87.1 86.6  < > 85.6 85.4 86.1  PLT 273 306  < > 334 331 301  < > = values in this interval not displayed. Cardiac Enzymes: No results for input(s): CKTOTAL, CKMB, CKMBINDEX, TROPONINI in the last 8760 hours. BNP: Invalid input(s): POCBNP CBG:  Recent Labs  05/30/14 2342 05/31/14 0412 05/31/14 0751  GLUCAP 90 99 99    Radiological Exams: 4/28 CT head: Large left frontal parietal intracerebral hemorrhage with a 7.5 mm of midline shift. The hemorrhage has extended into the left lateral ventricle 4/29 CT head: Interval decompression of the left-sided cerebral hemorrhage, with subdural drain in place along the craniotomy site. The hematoma is significantly reduced in volume. There is gas in the center of the hematoma and a small amount of gas and blood products in the extra-axial space on the left. Intraventricular hemorrhage in both lateral ventricles and in the third and fourth ventricle, without hydrocephalus. Faintly increased density along the right temporal lobe is compatible with a small amount of new subarachnoid hemorrhage along the right temporal lobe. 3 mm of left-to-right midline shift 4/29 TTE: nml LV fn 5/12 CT chest: Mild central pulmonary vascular prominence.  No segmental consolidation.  Calcified tortuous aorta.    5/4 mri/mra head and neck:  IMPRESSION: 1. Stable appearance of residual  left frontal and parietal hemorrhage following the craniotomy. 2. Areas of acute/subacute nonhemorrhagic infarct within the occipital lobes bilaterally, the posterior left temporal lobe, and the left globus pallidus. 3. 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 The configuration suggests this could be an on stable plaque. 4. Moderate to high-grade stenosis of the proximal right vertebral artery without a significant distal stenosis. 5. Mild atherosclerotic changes at the right carotid bifurcation and bilateral cavernous internal carotid arteries without other stenoses. 6. Subarachnoid hemorrhage over the right temporal lobe. 7. Intravascular enhancement in the posterior circulation territories suggests luxury perfusion or less likely meningitis. 8. Nonspecific enhancement surrounding the area of hemorrhage likely related to the recent surgery.    Assessment/plan  Intra-cerebral hemorrhage  S/P left frontoparietal craniotomy. Has follow up with neurosurgery and neurology. Will have patient work with PT/OT as tolerated to regain strength and restore function.  Fall precautions are in place. Continue keppra 500 mg bid for seizure prophylaxis until follow up with neurology  Hemorrhagic stroke With right sided weakness. To work with therapy team. Has neurology follow up. To provide assistance with ADLs. Pressure ulcer prophylaxis  Dysphagia Post cva, to work with SLP team. Continue dysphagia 3 diet for now with aspiration precautions  Aphasia post cva To work with SLP team.   Anemia Likely post intracerebral bleed. Monitor h7h  HTN Stable bp reading. Continue lopressor 12.5 mg bid and monitor bp readings  Gram negative rods uti Asymptomatic, continue and complete course of bactrim, hydration encouraged  Abdominal aortic aneurysm S/p stent. Continue aspirin  Depression Stable mood. continue Celexa 10 mg  daily  gerd Continue protonix 40 mg daily  Hypothyroidism continue Synthroid 75 mcg daily  Constipation  Continue senna S 2 tabs bid and miralax for now   Goals of care: short term rehabilitation   Labs/tests ordered: cbc, cmp 1 week  Family/ staff Communication: reviewed care plan with patient and nursing supervisor    Oneal Grout, MD  [facility] [phone] (Monday-Friday 8 am - 5 pm) [phone] (afterhours)

## 2014-07-19 LAB — BASIC METABOLIC PANEL
BUN: 11 mg/dL (ref 4–21)
Creatinine: 0.7 mg/dL (ref 0.5–1.1)
GLUCOSE: 87 mg/dL
Potassium: 4.3 mmol/L (ref 3.4–5.3)
SODIUM: 143 mmol/L (ref 137–147)

## 2014-07-19 LAB — CBC AND DIFFERENTIAL
HEMATOCRIT: 35 % — AB (ref 36–46)
Hemoglobin: 11 g/dL — AB (ref 12.0–16.0)
Platelets: 217 10*3/uL (ref 150–399)
WBC: 7.6 10*3/mL

## 2014-08-13 ENCOUNTER — Encounter: Payer: Self-pay | Admitting: Internal Medicine

## 2014-08-13 ENCOUNTER — Non-Acute Institutional Stay (SKILLED_NURSING_FACILITY): Payer: BLUE CROSS/BLUE SHIELD | Admitting: Internal Medicine

## 2014-08-13 DIAGNOSIS — I714 Abdominal aortic aneurysm, without rupture, unspecified: Secondary | ICD-10-CM

## 2014-08-13 DIAGNOSIS — R4701 Aphasia: Secondary | ICD-10-CM | POA: Diagnosis not present

## 2014-08-13 DIAGNOSIS — I82401 Acute embolism and thrombosis of unspecified deep veins of right lower extremity: Secondary | ICD-10-CM | POA: Insufficient documentation

## 2014-08-13 DIAGNOSIS — E039 Hypothyroidism, unspecified: Secondary | ICD-10-CM

## 2014-08-13 DIAGNOSIS — I10 Essential (primary) hypertension: Secondary | ICD-10-CM | POA: Diagnosis not present

## 2014-08-13 DIAGNOSIS — I619 Nontraumatic intracerebral hemorrhage, unspecified: Secondary | ICD-10-CM | POA: Diagnosis not present

## 2014-08-13 DIAGNOSIS — I2699 Other pulmonary embolism without acute cor pulmonale: Secondary | ICD-10-CM | POA: Diagnosis not present

## 2014-08-13 DIAGNOSIS — F329 Major depressive disorder, single episode, unspecified: Secondary | ICD-10-CM | POA: Insufficient documentation

## 2014-08-13 DIAGNOSIS — D62 Acute posthemorrhagic anemia: Secondary | ICD-10-CM

## 2014-08-13 DIAGNOSIS — K5900 Constipation, unspecified: Secondary | ICD-10-CM

## 2014-08-13 DIAGNOSIS — R5381 Other malaise: Secondary | ICD-10-CM

## 2014-08-13 DIAGNOSIS — F322 Major depressive disorder, single episode, severe without psychotic features: Secondary | ICD-10-CM | POA: Diagnosis not present

## 2014-08-13 DIAGNOSIS — N3000 Acute cystitis without hematuria: Secondary | ICD-10-CM | POA: Diagnosis not present

## 2014-08-13 NOTE — Progress Notes (Signed)
Patient ID: Carolyn [name], female   DOB: [birth year], 69 y.o.   MRN: [id number]    [facility]  PCP: Rosario Adie, MD  Code Status: DNR  Allergies  Allergen Reactions  . Statins Other (See Comments)    reductis inhibitors  . Adhesive [Tape] Rash  . Latex Rash  . Other Rash    Medical tape and bandaids    Chief Complaint  Patient presents with  . Readmit To SNF     HPI:  69 y.o. patient is here for short term rehabilitation post hospital admission from 08/03/14-08/11/14 with UTI, non obstructing renal stone, pulmonary embolism and right leg DVT. she is now on coumadin and tolerating it well. She completed a week course of antibiotics. She was in our facility prior to this admission for STR post intracranial hemorrhage. She is seen in her room today. She denies any concerns. She has PMH of HTN, AAA with stent, recent ICH among others.  Review of Systems:  Constitutional: Negative for fever, chills, diaphoresis. positive for easy fatigue HENT: Negative for headache, congestion, nasal discharge Eyes: Negative for eye pain, blurred vision, double vision and discharge.  Respiratory: Negative for cough, shortness of breath and wheezing.   Cardiovascular: Negative for chest pain, palpitations. Positive for leg swelling.  Gastrointestinal: Negative for heartburn, nausea, vomiting, abdominal pain Genitourinary: Negative for dysuria, flank pain.  Musculoskeletal: Negative for back pain, falls Skin: Negative for itching, sores and rash.  Neurological: Negative for dizziness, tingling, focal weakness Psychiatric/Behavioral: Negative for depression   Past Medical History  Diagnosis Date  . Hypertension   . AAA (abdominal aortic aneurysm)     a. s/p stent grafting @ [facility].  . Hemorrhagic cerebrovascular accident (CVA)     a. 05/20/2014 L hemispheric hemorrhagic CVA s/p decompressive craniotomy and evacuation.  . H/O echocardiogram     a. 04/2014 Echo: EF 65-70%,  Gr 1 DD, mild LVH.   Past Surgical History  Procedure Laterality Date  . Abdominal aortic aneurysm repair    . Craniotomy Left 05/20/2014    Procedure: CRANIOTOMY HEMATOMA EVACUATION;  Surgeon: Aliene Beams, MD;  Location: [facility];  Service: Neurosurgery;  Laterality: Left;   Social History:   reports that she quit smoking about 9 years ago. She does not have any smokeless tobacco history on file. She reports that she drinks alcohol. She reports that she does not use illicit drugs.  History reviewed. No pertinent family history.  Medications:   Medication List       This list is accurate as of: 08/13/14  3:22 PM.  Always use your most recent med list.               acetaminophen 500 MG tablet  Commonly known as:  TYLENOL  Take 500 mg by mouth 2 (two) times daily.     aspirin EC 81 MG tablet  81 mg. 4 by mouth daily as preventive     citalopram 10 MG tablet  Commonly known as:  CELEXA  Take 10 mg by mouth daily.     levETIRAcetam 500 MG tablet  Commonly known as:  KEPPRA  Take 500 mg by mouth 2 (two) times daily.     levothyroxine 75 MCG tablet  Commonly known as:  SYNTHROID, LEVOTHROID  Take 75 mcg by mouth daily before breakfast.     metoprolol tartrate 25 MG tablet  Commonly known as:  LOPRESSOR  Take 25 mg by mouth 2 (two) times  daily.     multivitamin with minerals Tabs tablet  Take 1 tablet by mouth daily.     pantoprazole 40 MG tablet  Commonly known as:  PROTONIX  Take 40 mg by mouth daily.     polyethylene glycol packet  Commonly known as:  MIRALAX / GLYCOLAX  Take 17 g by mouth 2 (two) times daily.     sennosides-docusate sodium 8.6-50 MG tablet  Commonly known as:  SENOKOT-S  Take 2 tablets by mouth 2 (two) times daily.     Simethicone 80 MG Tabs  Take 160 mg by mouth 3 (three) times daily as needed.     warfarin 2 MG tablet  Commonly known as:  COUMADIN  Take 2 mg by mouth daily.         Physical Exam: Filed Vitals:   08/13/14  1012  BP: 156/75  Pulse: 80  Temp: 96.7 F (35.9 C)  TempSrc: Oral  Resp: 16  Height:  (1.626 m)  Weight: 190 lb (86.183 kg)  SpO2: 94%    General- elderly female, well built, in no acute distress Throat- moist mucus membrane Eyes- PERRLA, EOMI, no pallor, no icterus, no discharge, normal conjunctiva, normal sclera Neck- no cervical lymphadenopathy, no jugular vein distension Cardiovascular- normal s1,s2, no murmurs, palpable dorsalis pedis and radial pulses, right leg 1+ and left leg trace edema  Respiratory- bilateral clear to auscultation, no wheeze, no rhonchi, no crackles, no use of accessory muscles Abdomen- bowel sounds present, soft, non tender Musculoskeletal- able to move all 4 extremities, generalized weakness right > left Neurological- no focal deficit, alert and oriented to person, place and time Skin- warm and dry Psychiatry- normal mood and affect    Labs reviewed: Basic Metabolic Panel:  Recent Labs  16/10/96 0520  06/02/14 0441 06/03/14 0414 06/04/14 0540 07/04/14 0703 07/19/14  NA 140  < > 142 139 138 140 143  K 3.9  < > 3.1* 3.7 3.6 3.7 4.3  CL 100*  < > 107 104 102 100*  --   CO2 30  < > --   GLUCOSE 121*  < > 90 96 96 104*  --   BUN 21*  < > CREATININE 0.50  < > 0.54 0.60 0.62 0.71 0.7  CALCIUM 9.3  < > 8.2* 9.1 9.2 9.3  --   MG 2.2  --  2.0 2.0  --   --   --   PHOS 4.0  --  3.5 3.9  --   --   --   < > = values in this interval not displayed. Liver Function Tests:  Recent Labs  05/20/14 1343 05/21/14 0400 06/04/14 0540  AST 28 73* 25  ALT 29 34 23  ALKPHOS 125* 108 99  BILITOT 0.6 1.1 0.4  PROT 6.7 6.6 6.7  ALBUMIN 3.9 3.6 3.1*   No results for input(s): LIPASE, AMYLASE in the last 8760 hours. No results for input(s): AMMONIA in the last 8760 hours. CBC:  Recent Labs  05/28/14 0425 05/29/14 0510  06/03/14 0414 06/04/14 0540 07/04/14 0703 07/19/14  WBC 12.2* 13.0*  < > 13.7* 13.5* 7.8 7.6    NEUTROABS 8.7* 9.3*  --   --  9.7*  --   --   HGB 10.3* 10.6*  < > 11.2* 11.4* 11.2* 11.0*  HCT 33.8* 34.2*  < > 36.3 36.3 36.0 35*  MCV 87.1 86.6  < > 85.6 85.4  86.1  --   PLT 273 306  < > 334 331 301 217  < > = values in this interval not displayed. Cardiac Enzymes: No results for input(s): CKTOTAL, CKMB, CKMBINDEX, TROPONINI in the last 8760 hours. BNP: Invalid input(s): POCBNP CBG:  Recent Labs  05/30/14 2342 05/31/14 0412 05/31/14 0751  GLUCAP 90 99 99    Radiological Exams: Dg Chest Port 1 View  06/03/2014   CLINICAL DATA:  69 year old hypertensive female with hypoxia. Subsequent encounter.  EXAM: PORTABLE CHEST - 1 VIEW  COMPARISON:  06/01/2010.  FINDINGS: Left PICC line tip mid to distal superior vena cava level. No gross pneumothorax.  Mild rotation.  Heart slightly enlarged.  Calcified tortuous aorta.  Poor inspiration.  Central pulmonary vascular prominence.  No segmental consolidation.  IMPRESSION: Mild central pulmonary vascular prominence.  No segmental consolidation.  Calcified tortuous aorta.   Electronically Signed   By: Lacy Duverney M.D.   On: 06/03/2014 07:17    Assessment/Plan  Physical deconditioning Will have her work with physical therapy and occupational therapy team to help with gait training and muscle strengthening exercises.fall precautions. Skin care. Encourage to be out of bed.   Pulmonary embolism With right atrial thrombus. Continue coumadin 2 mg daily with inr check on 08/16/14, goal inr 2-3, to be n coumadin for atleast 1 year. Breathing stable at present, monitor  Right leg DVT Continue coumadin with goal inr 2-3  UTI Asymptomatic, completed her antibiotics, hydration encouraged  Anemia Recent cerebral bleed and chronic disease could be contributing to this. Monitor h&h  Intra-cerebral hemorrhage with hemorrhagic stroke Recent, S/P left frontoparietal craniotomy. Continue keppra 500 mg bid for seizure prophylaxis.To work with therapy  team. To provide assistance with ADLs. Pressure ulcer prophylaxis. Currently on aspirin for stroke prevention  Aphasia post cva To work with SLP team.   Depression Stable mood, continue celexa 10 mg daily  Constipation  Continue senna S 2 tabs bid and miralax for now  Hypothyroidism continue Synthroid 75 mcg daily  gerd Continue protonix 40 mg daily  HTN Elevated  bp reading. Continue lopressor 12.5 mg bid and monitor bp q shift for now for a week and adjust bp medication if needed. Goal < 140/90  Abdominal aortic aneurysm S/p stent. Continue aspirin   Goals of care: short term rehabilitation   Labs/tests ordered: cbc, cmp 1 week  Family/ staff Communication: reviewed care plan with patient and nursing supervisor    Oneal Grout, MD  [facility] [phone] (Monday-Friday 8 am - 5 pm) [phone] (afterhours)

## 2014-08-16 ENCOUNTER — Encounter: Payer: Self-pay | Admitting: *Deleted

## 2014-08-24 ENCOUNTER — Encounter: Payer: Self-pay | Admitting: Adult Health

## 2014-08-24 ENCOUNTER — Non-Acute Institutional Stay (SKILLED_NURSING_FACILITY): Payer: BLUE CROSS/BLUE SHIELD | Admitting: Adult Health

## 2014-08-24 DIAGNOSIS — Z7901 Long term (current) use of anticoagulants: Secondary | ICD-10-CM

## 2014-08-24 DIAGNOSIS — I2699 Other pulmonary embolism without acute cor pulmonale: Secondary | ICD-10-CM | POA: Diagnosis not present

## 2014-08-24 DIAGNOSIS — I82401 Acute embolism and thrombosis of unspecified deep veins of right lower extremity: Secondary | ICD-10-CM | POA: Diagnosis not present

## 2014-08-24 NOTE — Progress Notes (Signed)
Patient ID: Carolyn [name], female   DOB: [birth year], 69 y.o.   MRN: [id number] Subjective:     Indication: DVT Bleeding signs/symptoms: None Thromboembolic signs/symptoms: None  Missed Coumadin doses: None Medication changes: no Dietary changes: no Bacterial/viral infection: no Other concerns: no     Review of Systems A comprehensive review of systems was negative.   Objective:    INR Today: 1.7 Current dose:   2.5 mg  Assessment:    Subtherapeutic INR for goal of 2-3   Plan:    1. New dose: increase Coumadin to 3 mg PO Q D   2. Next INR:   08/27/14

## 2014-08-30 ENCOUNTER — Encounter: Payer: Self-pay | Admitting: Neurology

## 2014-08-30 ENCOUNTER — Ambulatory Visit (INDEPENDENT_AMBULATORY_CARE_PROVIDER_SITE_OTHER): Payer: BLUE CROSS/BLUE SHIELD | Admitting: Neurology

## 2014-08-30 VITALS — BP 135/72 | HR 67 | Ht 65.0 in | Wt 205.4 lb

## 2014-08-30 DIAGNOSIS — I619 Nontraumatic intracerebral hemorrhage, unspecified: Secondary | ICD-10-CM

## 2014-08-30 DIAGNOSIS — I82401 Acute embolism and thrombosis of unspecified deep veins of right lower extremity: Secondary | ICD-10-CM

## 2014-08-30 DIAGNOSIS — Z9889 Other specified postprocedural states: Secondary | ICD-10-CM | POA: Diagnosis not present

## 2014-08-30 DIAGNOSIS — I10 Essential (primary) hypertension: Secondary | ICD-10-CM | POA: Diagnosis not present

## 2014-08-30 DIAGNOSIS — I2699 Other pulmonary embolism without acute cor pulmonale: Secondary | ICD-10-CM | POA: Diagnosis not present

## 2014-08-30 DIAGNOSIS — I6340 Cerebral infarction due to embolism of unspecified cerebral artery: Secondary | ICD-10-CM | POA: Diagnosis not present

## 2014-08-30 DIAGNOSIS — E785 Hyperlipidemia, unspecified: Secondary | ICD-10-CM

## 2014-08-30 DIAGNOSIS — I6522 Occlusion and stenosis of left carotid artery: Secondary | ICD-10-CM | POA: Diagnosis not present

## 2014-08-30 DIAGNOSIS — I639 Cerebral infarction, unspecified: Secondary | ICD-10-CM

## 2014-08-30 MED ORDER — ASPIRIN EC 81 MG PO TBEC: 81.0000 mg | DELAYED_RELEASE_TABLET | Freq: Every day | ORAL | Status: AC

## 2014-08-31 ENCOUNTER — Other Ambulatory Visit: Payer: Self-pay | Admitting: Neurosurgery

## 2014-08-31 DIAGNOSIS — Z9889 Other specified postprocedural states: Secondary | ICD-10-CM | POA: Insufficient documentation

## 2014-08-31 DIAGNOSIS — I82409 Acute embolism and thrombosis of unspecified deep veins of unspecified lower extremity: Secondary | ICD-10-CM | POA: Insufficient documentation

## 2014-08-31 DIAGNOSIS — I619 Nontraumatic intracerebral hemorrhage, unspecified: Secondary | ICD-10-CM

## 2014-08-31 DIAGNOSIS — I6529 Occlusion and stenosis of unspecified carotid artery: Secondary | ICD-10-CM | POA: Insufficient documentation

## 2014-08-31 DIAGNOSIS — I10 Essential (primary) hypertension: Secondary | ICD-10-CM | POA: Insufficient documentation

## 2014-08-31 NOTE — Progress Notes (Signed)
STROKE NEUROLOGY FOLLOW UP NOTE  NAME: Carolyn [name] DOB: Feb 18, 1945  REASON FOR VISIT: stroke follow up HISTORY FROM: chart and PT  Today we had the pleasure of seeing Carolyn [name] in follow-up at our Neurology Clinic. Pt was accompanied by daughter and PT.   History Summary Carolyn [name] is a 69 y.o. female with history of hypertension was admitted on 05/20/14 for right arm weakness, severe headache, and subsequent unconsciousness. CT found a large left frontal parietal intracerebral hemorrhage. Had hematoma evacuation by neurosurgery. Subsequent MRI showed likely hemorrhagic transformation at left MCA territory as well as bilateral occipital region with likely right frontal punctate infarcts too. Concerning for embolic strokes. MRA neck also showed 60% ICA stenosis with ulcerated plaque. She was put on ASA 81mg  and keppra for seizure prophylaxis. She was sent to CIR first and then SNF.  Interval History During the interval time, the patient has been doing better. As per PT today, she is able to stand up with assistance, but still not able to walk with walker due to right UE weakness. Right LE improved but still not able to walk. Speech much improved. Still has PT/OT/speech. On 08/05/14, she was admitted to [facility] due to back pain, found to have UTI and was treated with Abx. However, also found to have right leg DVT as well as extensive PE with bulky thrombus in the right atrium. She was started on coumadin bridged with lovenox. Currently she is on coumadin only with recent INR 2.4. Anticoagulation needed to be at least 12 months according to the discharge summary.   REVIEW OF SYSTEMS: Full 14 system review of systems performed and notable only for those listed below and in HPI above, all others are negative:  Constitutional:   Cardiovascular:  Ear/Nose/Throat:   Skin:  Eyes:   Respiratory:   Gastroitestinal:   Genitourinary:  Hematology/Lymphatic:     Endocrine:  Musculoskeletal:   Allergy/Immunology:   Neurological:   Psychiatric:  Sleep:   The following represents the patient's updated allergies and side effects list: Allergies  Allergen Reactions  . Statins Other (See Comments)    reductis inhibitors  . Adhesive [Tape] Rash  . Latex Rash  . Other Rash    Medical tape and bandaids    The neurologically relevant items on the patient's problem list were reviewed on today's visit.  Neurologic Examination  A problem focused neurological exam (12 or more points of the single system neurologic examination, vital signs counts as 1 point, cranial nerves count for 8 points) was performed.  Blood pressure 135/72, pulse 67, height 5\' 5"  (1.651 m), weight 205 lb 6.4 oz (93.169 kg).  General - Well nourished, well developed, in no apparent distress.  Ophthalmologic - Fundi not visualized due to noncooperation.  Cardiovascular - Regular rate and rhythm.  Mental Status -  Level of arousal and orientation to place, and person were intact, but not to time. Language exam showed mild perseveration, paraphasic errors, partial expressive and receptive aphasia, follow some simple commands, naming 3/5, able to repeat but dysarthria.  Cranial Nerves II - XII - II - Visual field intact OU, but inconsistent with simultaneous stimulation. III, IV, VI - Extraocular movements intact. V - Facial sensation intact bilaterally. VII - right facial droop. VIII - Hearing & vestibular intact bilaterally. X - Palate elevates symmetrically. XI - Chin turning intact bilaterally. XII - Tongue protrusion intact.  Motor Strength - The patient's strength was RUE 0/5, RLE proximal 0/5, knee  extension 3/5, distal 2/5.  Bulk was normal and fasciculations were absent.   Motor Tone - Muscle tone was assessed at the neck and appendages and was normal.  Reflexes - The patient's reflexes were 1+ in all extremities and she had no pathological reflexes.  Sensory  - Light touch, temperature/pinprick were assessed and were normal.    Coordination - The patient had normal movements in the left hands with no ataxia or dysmetria.  Tremor was absent.  Gait and Station - in wheelchair, not tested.  Data reviewed: I personally reviewed the images and agree with the radiology interpretations.  Ct Head Wo Contrast 05/26/14 No significant change in volume of intraparenchymal hemorrhage. No new intracranial hemorrhage. Decreasing volume of intraventricular hemorrhage. No change in size of the cerebral ventricles. Recent nonhemorrhagic infarctions in the left occipital lobe and in the left globus pallidus/internal capsule genu.  05/21/2014 IMPRESSION: 1. Interval decompression of the left-sided cerebral hemorrhage, with subdural drain in place along the craniotomy site. The hematoma is significantly reduced in volume. There is gas in the center of the hematoma and a small amount of gas and blood products in the extra-axial space on the left. 2. Intraventricular hemorrhage in both lateral ventricles and in the third and fourth ventricle, without hydrocephalus. 3. Faintly increased density along the right temporal lobe is compatible with a small amount of new subarachnoid hemorrhage along the right temporal lobe. 4. 3 mm of left-to-right midline shift.   05/20/2014 IMPRESSION: Large left frontal parietal intracerebral hemorrhage with a 7.5 mm of midline shift. The hemorrhage has extended into the left lateral ventricle.   Dg Chest Port 1 View  05/21/2014 IMPRESSION: NG tube placed. Stable bibasilar atelectasis. No evidence of pulmonary edema.   05/20/2014 IMPRESSION: Endotracheal tube tip at the carina with the tip directed toward the right main bronchus. No edema or consolidation. No pneumothorax.   2D echo - estimated ejection fraction was in the range of 65% to 70%  CUS - 1-39% right internal carotid artery stenosis and 40-59% left internal  carotid artery stenosis. The left vertebral artery is patent with antegrade flow. Unable to visualize the right vertebral artery.   MRI brain and MRA head and neck 1. Stable appearance of residual left frontal and parietal hemorrhage following the craniotomy. 2. Areas of acute/subacute nonhemorrhagic infarct within the occipital lobes bilaterally, the posterior left temporal lobe, and the left globus pallidus. 3. 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 The configuration suggests this could be an on stable plaque. 4. Moderate to high-grade stenosis of the proximal right vertebral artery without a significant distal stenosis. 5. Mild atherosclerotic changes at the right carotid bifurcation and bilateral cavernous internal carotid arteries without other stenoses. 6. Subarachnoid hemorrhage over the right temporal lobe. 7. Intravascular enhancement in the posterior circulation territories suggests luxury perfusion or less likely meningitis. 8. Nonspecific enhancement surrounding the area of hemorrhage likely related to the recent surgery.  Component     Latest Ref Rng 05/21/2014  Cholesterol     0 - 200 mg/dL 161 (H)  Triglycerides     <150 mg/dL 096 (H)  HDL Cholesterol     >39 mg/dL 61  Total CHOL/HDL Ratio      4.0  VLDL     0 - 40 mg/dL 49 (H)  LDL (calc)     0 - 99 mg/dL 045 (H)  Hemoglobin W0J     4.8 - 5.6 % 5.9 (H)  Mean Plasma Glucose      123  TSH     0.350 - 4.500 uIU/mL 0.825  Free T4     0.80 - 1.80 ng/dL 9.60    Assessment: As you may recall, she is a 69 y.o. Caucasian female with PMH of HTN was admitted on 05/20/14 for left MCA hemorrhagic infarct, found to have bilateral occipital and likely left frontal infarcts too. Concerning for embolic pattern. She was also found to have left ICA 60% stenosis with ulcerative plaques. She has hematoma evacuation by NSG and was put on ASA 81 with keppra for seizure  prophylaxis. Sent to CIR and later SNF. In 07/2014 found to have UTI, DVT, PE and right atrium clot, was put on coumadin with lovenox bridge. Currently on coumadin with therapeutic INR. Right LE improved strength but RUE still 0/5. Still on ASA  and keppra now. Currently in SNF undergoing therapy.  Plan:  - decrease ASA  to ASA  - continue coumadin with INR 2-3 goal - statin intolerance in the past. Will need to repeat lipid panel in SNF and follow up with PCP. - taper off keppra - will do CTA neck to evaluate left ICA stenosis and plaque - recommend AFO for right LE - aggressive PT/OT/speech - Follow up with your primary care physician for stroke risk factor modification. Recommend maintain blood pressure goal <130/80, diabetes with hemoglobin A1c goal below 6.5% and lipids with LDL cholesterol goal below 70 mg/dL.  - RTC in 2 months  Orders Placed This Encounter  Procedures  . CT Angio Neck W/Cm &/Or Wo/Cm    Standing Status: Future     Number of Occurrences:      Standing Expiration Date: 10/30/2015    Order Specific Question:  Reason for Exam (SYMPTOM  OR DIAGNOSIS REQUIRED)    Answer:  left ICA stenosis follow up    Order Specific Question:  Preferred imaging location?    Answer:  Internal    Meds ordered this encounter  Medications  . aspirin EC 81 MG tablet    Sig: Take 1 tablet (81 mg total) by mouth daily. 4 by mouth daily as preventive     Marvel Plan, MD PhD [facility] [address] [place], [place] [address] [phone]

## 2014-09-07 ENCOUNTER — Telehealth: Payer: Self-pay | Admitting: Neurology

## 2014-09-07 ENCOUNTER — Encounter: Payer: Medicare Other | Admitting: Physical Medicine & Rehabilitation

## 2014-09-07 NOTE — Telephone Encounter (Signed)
Patient's daughter was calling about [place] filling out a disability form for her mother but was not willing to pay the $25 charge.

## 2014-09-07 NOTE — Telephone Encounter (Signed)
Alyssa with GI called back and does not need an order.  She found recent lab work. Thanks!

## 2014-09-07 NOTE — Telephone Encounter (Signed)
Carolyn [name] with [facility] is calling and would like lab orders for a stack creatinine.  The patient has an appointment with GI tomorrow so they would need to come in today for lab work.  Please call Carolyn [name].  Thanks!

## 2014-09-08 ENCOUNTER — Ambulatory Visit
Admission: RE | Admit: 2014-09-08 | Discharge: 2014-09-08 | Disposition: A | Discharge status: Home / Self Care | Payer: Medicare Other | Source: Ambulatory Visit | Attending: Neurology | Admitting: Neurology

## 2014-09-08 ENCOUNTER — Ambulatory Visit
Admission: RE | Admit: 2014-09-08 | Discharge: 2014-09-08 | Disposition: A | Discharge status: Home / Self Care | Payer: Medicare Other | Source: Ambulatory Visit | Attending: Neurosurgery | Admitting: Neurosurgery

## 2014-09-08 DIAGNOSIS — I6340 Cerebral infarction due to embolism of unspecified cerebral artery: Secondary | ICD-10-CM

## 2014-09-08 DIAGNOSIS — I619 Nontraumatic intracerebral hemorrhage, unspecified: Secondary | ICD-10-CM

## 2014-09-08 MED ORDER — IOPAMIDOL (ISOVUE-370) INJECTION 76%
100.0000 mL | Freq: Once | INTRAVENOUS | Status: AC | PRN
Start: 2014-09-08 — End: 2014-09-08
  Administered 2014-09-08: 100 mL via INTRAVENOUS

## 2014-09-10 NOTE — Telephone Encounter (Signed)
Discussed with daughter over the phone about pt's CTA neck which showed no change of left ICA stenosis. No change of treatment plan. Need annual monitor of the left ICA stenosis. Daughter expressed understanding and appreciation.  Marvel Plan, MD PhD Stroke Neurology 09/10/2014 4:46 PM

## 2014-09-13 ENCOUNTER — Non-Acute Institutional Stay (SKILLED_NURSING_FACILITY): Payer: BLUE CROSS/BLUE SHIELD | Admitting: Internal Medicine

## 2014-09-13 DIAGNOSIS — I6340 Cerebral infarction due to embolism of unspecified cerebral artery: Secondary | ICD-10-CM

## 2014-09-13 DIAGNOSIS — I69322 Dysarthria following cerebral infarction: Secondary | ICD-10-CM

## 2014-09-13 DIAGNOSIS — I6981 Cognitive deficits following other cerebrovascular disease: Secondary | ICD-10-CM | POA: Diagnosis not present

## 2014-09-13 DIAGNOSIS — F329 Major depressive disorder, single episode, unspecified: Secondary | ICD-10-CM

## 2014-09-13 DIAGNOSIS — F322 Major depressive disorder, single episode, severe without psychotic features: Secondary | ICD-10-CM

## 2014-09-13 DIAGNOSIS — K219 Gastro-esophageal reflux disease without esophagitis: Secondary | ICD-10-CM

## 2014-09-13 DIAGNOSIS — I714 Abdominal aortic aneurysm, without rupture, unspecified: Secondary | ICD-10-CM

## 2014-09-13 DIAGNOSIS — E039 Hypothyroidism, unspecified: Secondary | ICD-10-CM | POA: Diagnosis not present

## 2014-09-13 DIAGNOSIS — K5900 Constipation, unspecified: Secondary | ICD-10-CM

## 2014-09-13 DIAGNOSIS — I69319 Unspecified symptoms and signs involving cognitive functions following cerebral infarction: Secondary | ICD-10-CM

## 2014-09-13 DIAGNOSIS — I2699 Other pulmonary embolism without acute cor pulmonale: Secondary | ICD-10-CM

## 2014-09-13 DIAGNOSIS — D638 Anemia in other chronic diseases classified elsewhere: Secondary | ICD-10-CM | POA: Diagnosis not present

## 2014-09-13 DIAGNOSIS — I82401 Acute embolism and thrombosis of unspecified deep veins of right lower extremity: Secondary | ICD-10-CM | POA: Diagnosis not present

## 2014-09-13 DIAGNOSIS — R2681 Unsteadiness on feet: Secondary | ICD-10-CM

## 2014-09-13 DIAGNOSIS — I10 Essential (primary) hypertension: Secondary | ICD-10-CM | POA: Diagnosis not present

## 2014-09-14 ENCOUNTER — Non-Acute Institutional Stay (SKILLED_NURSING_FACILITY): Payer: BLUE CROSS/BLUE SHIELD | Admitting: Adult Health

## 2014-09-14 DIAGNOSIS — I82401 Acute embolism and thrombosis of unspecified deep veins of right lower extremity: Secondary | ICD-10-CM | POA: Diagnosis not present

## 2014-09-14 DIAGNOSIS — I2699 Other pulmonary embolism without acute cor pulmonale: Secondary | ICD-10-CM

## 2014-09-14 DIAGNOSIS — Z7901 Long term (current) use of anticoagulants: Secondary | ICD-10-CM | POA: Diagnosis not present

## 2014-09-14 NOTE — Progress Notes (Signed)
Patient ID: Carolyn [name], female   DOB: 1945-12-09, 69 y.o.   MRN: [id number]      [facility]  PCP: Rosario Adie, MD  Code Status: DNR  Allergies  Allergen Reactions  . Statins Other (See Comments)    reductis inhibitors  . Adhesive [Tape] Rash  . Latex Rash  . Other Rash    Medical tape and bandaids    Chief Complaint  Patient presents with  . Medical Management of Chronic Issues     HPI:  69 y.o. patient is here for short term rehabilitation and seen today for routine visit. She is status post intracranial hemorrhage, DVT and PE, AAA s/p stent. She has been working with therapy team PT, OT and SLP, she has made improvement with therapy team. She is able to walk some distance with assistance. She needs cueing and has some cognitive barrier. She is on her wheelchair and wheeling herself around. She denies any concerns. As per therapy team, they had home safety evaluation done today.   Review of Systems:  Constitutional: Negative for fever, chills, diaphoresis. positive for some fatigue post therapy HENT: Negative for headache, congestion, nasal discharge Eyes: Negative for eye pain, blurred vision, double vision and discharge.  Respiratory: Negative for cough, shortness of breath and wheezing.   Cardiovascular: Negative for chest pain, palpitations. Positive for leg swelling.  Gastrointestinal: Negative for heartburn, nausea, vomiting, abdominal pain, constipation or diarrhea Genitourinary: Negative for dysuria, flank pain.  Musculoskeletal: Negative for back pain, falls Skin: Negative for itching, sores and rash.  Neurological: Negative for dizziness, tingling. Has right sided weakness post CVA Psychiatric/Behavioral: Negative for depression   Past Medical History  Diagnosis Date  . Hypertension   . AAA (abdominal aortic aneurysm)     a. s/p stent grafting @ [facility].  . Hemorrhagic cerebrovascular accident (CVA)     a. 05/20/2014 L hemispheric  hemorrhagic CVA s/p decompressive craniotomy and evacuation.  . H/O echocardiogram     a. 04/2014 Echo: EF 65-70%, Gr 1 DD, mild LVH.    Medications:   Medication List       This list is accurate as of: 09/13/14 11:59 PM.  Always use your most recent med list.               acetaminophen 500 MG tablet  Commonly known as:  TYLENOL  Take 500 mg by mouth 2 (two) times daily.     aspirin EC 81 MG tablet  Take 1 tablet (81 mg total) by mouth daily. 4 by mouth daily as preventive     citalopram 10 MG tablet  Commonly known as:  CELEXA  Take 10 mg by mouth daily.     levothyroxine 75 MCG tablet  Commonly known as:  SYNTHROID, LEVOTHROID  Take 75 mcg by mouth daily before breakfast.     metoprolol tartrate 25 MG tablet  Commonly known as:  LOPRESSOR  Take 25 mg by mouth 2 (two) times daily.     multivitamin with minerals Tabs tablet  Take 1 tablet by mouth daily.     pantoprazole 40 MG tablet  Commonly known as:  PROTONIX  Take 40 mg by mouth daily.     polyethylene glycol packet  Commonly known as:  MIRALAX / GLYCOLAX  Take 17 g by mouth 2 (two) times daily.     sennosides-docusate sodium 8.6-50 MG tablet  Commonly known as:  SENOKOT-S  Take 2 tablets by mouth 2 (two) times  daily.     warfarin 2 MG tablet  Commonly known as:  COUMADIN  Take 3 mg by mouth daily.         Physical Exam: Filed Vitals:   09/13/14 1748  BP: 145/65  Pulse: 74  Temp: 98 F (36.7 C)  Resp: 18  SpO2: 95%    General- elderly female, well built, in no acute distress Throat- moist mucus membrane Eyes- PERRLA, EOMI, no pallor, no icterus, no discharge, normal conjunctiva, normal sclera Neck- no cervical lymphadenopathy, no jugular vein distension Cardiovascular- normal s1,s2, no murmurs, palpable dorsalis pedis and radial pulses, leg edema + Respiratory- bilateral clear to auscultation, no wheeze, no rhonchi, no crackles, no use of accessory muscles Abdomen- bowel sounds present,  soft, non tender Musculoskeletal- able to move all 4 extremities, generalized weakness right > left Neurological- has some cognitive deficit, alert and oriented to person, place and time Skin- warm and dry, right arm skin tear Psychiatry- normal mood and affect    Labs reviewed: Basic Metabolic Panel:  Recent Labs  16/10/96 0520  06/02/14 0441 06/03/14 0414 06/04/14 0540 07/04/14 0703 07/19/14  NA 140  < > 142 139 138 140 143  K 3.9  < > 3.1* 3.7 3.6 3.7 4.3  CL 100*  < > 107 104 102 100*  --   CO2 30  < > 24 27 28 28   --   GLUCOSE 121*  < > 90 96 96 104*  --   BUN 21*  < > 16 13 12 9 11   CREATININE 0.50  < > 0.54 0.60 0.62 0.71 0.7  CALCIUM 9.3  < > 8.2* 9.1 9.2 9.3  --   MG 2.2  --  2.0 2.0  --   --   --   PHOS 4.0  --  3.5 3.9  --   --   --   < > = values in this interval not displayed. Liver Function Tests:  Recent Labs  05/20/14 1343 05/21/14 0400 06/04/14 0540  AST 28 73* 25  ALT 29 34 23  ALKPHOS 125* 108 99  BILITOT 0.6 1.1 0.4  PROT 6.7 6.6 6.7  ALBUMIN 3.9 3.6 3.1*   No results for input(s): LIPASE, AMYLASE in the last 8760 hours. No results for input(s): AMMONIA in the last 8760 hours. CBC:  Recent Labs  05/28/14 0425 05/29/14 0510  06/03/14 0414 06/04/14 0540 07/04/14 0703 07/19/14  WBC 12.2* 13.0*  < > 13.7* 13.5* 7.8 7.6  NEUTROABS 8.7* 9.3*  --   --  9.7*  --   --   HGB 10.3* 10.6*  < > 11.2* 11.4* 11.2* 11.0*  HCT 33.8* 34.2*  < > 36.3 36.3 36.0 35*  MCV 87.1 86.6  < > 85.6 85.4 86.1  --   PLT 273 306  < > 334 331 301 217  < > = values in this interval not displayed. Cardiac Enzymes: No results for input(s): CKTOTAL, CKMB, CKMBINDEX, TROPONINI in the last 8760 hours. BNP: Invalid input(s): POCBNP CBG:  Recent Labs  05/30/14 2342 05/31/14 0412 05/31/14 0751  GLUCAP 90 99 99   08/16/14 hb 10.4, hct 32.5, wbc 8.4, plt 337, mcv 81.5, na 142, k 4.2, bun 10, cr 0.67, glu 89, lft wnl  Assessment/Plan  Unsteady gait continue to  work with therapy team for gait training. Able to walk 50 ft with large quad cane with contact guard. needs cueing for safe transfer and initiation of activities. Fall precautions. Work with  PT and OT and will need changes in the house to help with safe transfer and mobility (ramp for wheelchair, family member supervision/ caregiver, changes to her bathroom to help with shower)  Cognitive deficit Alert and oriented but needs cueing to start activity in safe manner and needs redirection. High fall risk. provide assistance with ADL as needed. Skin care  Pulmonary embolism With right atrial thrombus. Continue coumadin 3 mg daily with last inr 2.5. Check inr 09/14/14. Goal inr 2-3  Intra-cerebral hemorrhage with hemorrhagic stroke S/P left frontoparietal craniotomy. Off keppra and remains seizure free. Right sided weakness. To provide assistance with ADLs. Pressure ulcer prophylaxis. Currently on aspirin for stroke prevention  Right sided weakness Post cva, to work with therapy team as above.   Dysarthria post cva To work with SLP team.   HTN Elevated  bp reading. On lopressor 12.5 mg bid at present. Monitor bp reading daily and adjsut bp medication if average reading > 130/90  Abdominal aortic aneurysm S/p stent. Continue aspirin  Anemia of chronic disease Monitor h&h, currently stable, check cbc with b12 and ferritin level  Right leg DVT Continue coumadin with goal inr 2-3  Constipation  Continue senna S 2 tabs bid and miralax for now  Hypothyroidism continue Synthroid 75 mcg daily and check tsh  gerd Continue protonix 40 mg daily  Depression Stable and improved mood, continue celexa 10 mg daily   Goals of care: short term rehabilitation   Labs/tests ordered: cbc, tsh, b12, ferritin  Family/ staff Communication: reviewed care plan with patient and nursing supervisor    Oneal Grout, MD  [facility] [phone] (Monday-Friday 8 am - 5 pm) [phone]  (afterhours)

## 2014-09-15 NOTE — Progress Notes (Signed)
Patient ID: Carolyn [name], female   DOB: 02-24-1945, 69 y.o.   MRN: [id number] Subjective:     Indication: DVT and PE Bleeding signs/symptoms: None Thromboembolic signs/symptoms: None  Missed Coumadin doses: None Medication changes: no Dietary changes: no Bacterial/viral infection: no Other concerns: no   Review of Systems A comprehensive review of systems was negative.   Objective:    INR Today: 1.8 Current dose:  Coumadin 3 mg  Assessment:    Subtherapeutic INR for goal of 2-3   Plan:    1. New dose: increase Coumadin to 3.5 mg daily   2. Next INR:  09/17/14

## 2014-09-22 ENCOUNTER — Non-Acute Institutional Stay (SKILLED_NURSING_FACILITY): Payer: BLUE CROSS/BLUE SHIELD | Admitting: Adult Health

## 2014-09-22 ENCOUNTER — Encounter: Payer: Self-pay | Admitting: Adult Health

## 2014-09-22 DIAGNOSIS — I82401 Acute embolism and thrombosis of unspecified deep veins of right lower extremity: Secondary | ICD-10-CM

## 2014-09-22 DIAGNOSIS — I10 Essential (primary) hypertension: Secondary | ICD-10-CM | POA: Diagnosis not present

## 2014-09-22 DIAGNOSIS — G8191 Hemiplegia, unspecified affecting right dominant side: Secondary | ICD-10-CM

## 2014-09-22 DIAGNOSIS — R4701 Aphasia: Secondary | ICD-10-CM | POA: Diagnosis not present

## 2014-09-22 DIAGNOSIS — F329 Major depressive disorder, single episode, unspecified: Secondary | ICD-10-CM | POA: Diagnosis not present

## 2014-09-22 DIAGNOSIS — I714 Abdominal aortic aneurysm, without rupture, unspecified: Secondary | ICD-10-CM

## 2014-09-22 DIAGNOSIS — G8190 Hemiplegia, unspecified affecting unspecified side: Secondary | ICD-10-CM | POA: Diagnosis not present

## 2014-09-22 DIAGNOSIS — F32A Depression, unspecified: Secondary | ICD-10-CM

## 2014-09-22 DIAGNOSIS — D638 Anemia in other chronic diseases classified elsewhere: Secondary | ICD-10-CM

## 2014-09-22 DIAGNOSIS — K219 Gastro-esophageal reflux disease without esophagitis: Secondary | ICD-10-CM | POA: Diagnosis not present

## 2014-09-22 DIAGNOSIS — I619 Nontraumatic intracerebral hemorrhage, unspecified: Secondary | ICD-10-CM | POA: Diagnosis not present

## 2014-09-22 DIAGNOSIS — K5900 Constipation, unspecified: Secondary | ICD-10-CM

## 2014-09-22 DIAGNOSIS — I2699 Other pulmonary embolism without acute cor pulmonale: Secondary | ICD-10-CM

## 2014-09-22 DIAGNOSIS — R5381 Other malaise: Secondary | ICD-10-CM | POA: Diagnosis not present

## 2014-09-22 DIAGNOSIS — E039 Hypothyroidism, unspecified: Secondary | ICD-10-CM | POA: Diagnosis not present

## 2014-09-22 DIAGNOSIS — Z7901 Long term (current) use of anticoagulants: Secondary | ICD-10-CM | POA: Diagnosis not present

## 2014-09-22 NOTE — Progress Notes (Signed)
Patient ID: Carolyn [name], female   DOB: Aug 12, 1945, 69 y.o.   MRN: [id number]    DATE:  09/22/2014 MRN:  [id number]  BIRTHDAY: December 13, 1945  Facility:  Nursing Home Location:  [facility]  Nursing Home Room Number: 1201-P  LEVEL OF CARE:  SNF [phone])  Contact Information    Name Relation Home Work Mobile   Hemric,Alicia Daughter [phone]  [phone]   Jerriah, Ines   [phone]   [place]le, [place]   [phone]      Chief Complaint  Patient presents with  . Discharge Note    Physical deconditioning, intracerebral hemorrhage with hemorrhagic stroke S/P fronto-parietal craniotomy, depression, hypothyroidism, GERD, hypertension, constipation, DOE, right leg DVT, anemia, aphasia and abdominal aortic aneurysm S/P stent    HISTORY OF PRESENT ILLNESS:  This is a 69 year old female who is for discharge home with Home health PT, OT, ST, Nursing and Home health aide. DME:   18" X 18" standard wheelchair with anti-tippers, desk length swing away arm rests, half lap tray (right clear if possible) and right break extender, standard leg rests, wheelchair cushion, 3-in-1 bedside commode.   She was readmitted to [facility] on 08/11/14 from being hospitalized 7/12 -7/20 with UTI, nonobstructive renal stone, pulmonary embolism and right leg DVT. She was first admitted to [facility] on 6/22 for a short-term rehabilitation post intracranial hemorrhage.  Patient was admitted to this facility for short-term rehabilitation after the patient's recent hospitalization.  Patient has completed SNF rehabilitation and therapy has cleared the patient for discharge.   PAST MEDICAL HISTORY:  Past Medical History  Diagnosis Date  . Hypertension   . AAA (abdominal aortic aneurysm)     a. s/p stent grafting @ [facility].  . Hemorrhagic cerebrovascular accident (CVA)     a. 05/20/2014 L hemispheric hemorrhagic CVA s/p decompressive craniotomy and evacuation.  . H/O echocardiogram    a. 04/2014 Echo: EF 65-70%, Gr 1 DD, mild LVH.     CURRENT MEDICATIONS: Reviewed  Patient's Medications  New Prescriptions   No medications on file  Previous Medications   ACETAMINOPHEN (TYLENOL) 500 MG TABLET    Take 500 mg by mouth 2 (two) times daily.   ASPIRIN EC 81 MG TABLET    Take 1 tablet (81 mg total) by mouth daily. 4 by mouth daily as preventive   CITALOPRAM (CELEXA) 10 MG TABLET    Take 10 mg by mouth daily.   LEVOTHYROXINE (SYNTHROID, LEVOTHROID) 75 MCG TABLET    Take 75 mcg by mouth daily before breakfast.   METOPROLOL TARTRATE (LOPRESSOR) 25 MG TABLET    Take 25 mg by mouth 2 (two) times daily. Take 1 1/2 tab = 37.5 mg BID   MULTIPLE VITAMIN (MULTIVITAMIN WITH MINERALS) TABS TABLET    Take 1 tablet by mouth daily.   PANTOPRAZOLE (PROTONIX) 40 MG TABLET    Take 40 mg by mouth daily.   POLYETHYLENE GLYCOL (MIRALAX / GLYCOLAX) PACKET    Take 17 g by mouth 2 (two) times daily.   SENNOSIDES-DOCUSATE SODIUM (SENOKOT-S) 8.6-50 MG TABLET    Take 2 tablets by mouth 2 (two) times daily.   WARFARIN SODIUM (COUMADIN PO)    Take 3.5 mg by mouth. Take 2.5 mg and 1 mg = 3.5 mg daily  Modified Medications   No medications on file  Discontinued Medications   METOPROLOL TARTRATE (LOPRESSOR) 25 MG TABLET    Take 25 mg by mouth 2 (two) times daily.    WARFARIN (COUMADIN) 2  MG TABLET    Take 3 mg by mouth daily.      Allergies  Allergen Reactions  . Statins Other (See Comments)    reductis inhibitors  . Adhesive [Tape] Rash  . Latex Rash  . Other Rash    Medical tape and bandaids     REVIEW OF SYSTEMS:  GENERAL: no change in appetite, no fatigue, no weight changes, no fever, chills or weakness EYES: Denies change in vision, dry eyes, eye pain, itching or discharge EARS: Denies change in hearing, ringing in ears, or earache NOSE: Denies nasal congestion or epistaxis MOUTH and THROAT: Denies oral discomfort, gingival pain or bleeding, pain from teeth or hoarseness   RESPIRATORY:  no cough, SOB, DOE, wheezing, hemoptysis CARDIAC: no chest pain, edema or palpitations GI: no abdominal pain, diarrhea, constipation, heart burn, nausea or vomiting GU: Denies dysuria, frequency, hematuria, incontinence, or discharge PSYCHIATRIC: Denies feeling of depression or anxiety. No report of hallucinations, insomnia, paranoia, or agitation   PHYSICAL EXAMINATION  GENERAL APPEARANCE: Well nourished. In no acute distress. Obese EYES: Lids open and close normally. No blepharitis, entropion or ectropion. PERRL. Conjunctivae are clear and sclerae are white. Lenses are without opacity EARS: Pinnae are normal. Patient hears normal voice tunes of the examiner MOUTH and THROAT: Lips are without lesions. Oral mucosa is moist and without lesions. Tongue is normal in shape, size, and color and without lesions NECK: supple, trachea midline, no neck masses, no thyroid tenderness, no thyromegaly LYMPHATICS: no LAN in the neck, no supraclavicular LAN RESPIRATORY: breathing is even & unlabored, BS CTAB CARDIAC: RRR, no murmur,no extra heart sounds, no edema GI: abdomen soft, normal BS, no masses, no tenderness, no hepatomegaly, no splenomegaly EXTREMITIES:  Right hemiplegia; able to move LUE and LLE PSYCHIATRIC: Alert and oriented X 3. Affect and behavior are appropriate  LABS/RADIOLOGY: Labs reviewed: 09/14/14  TSH 2.793 vitamin B12 422 ferritin 44 RBC folate 1172 08/16/14  WBC 8.4 IMO myoglobin 10.4 hematocrit 32.5 MCV 81.5 platelet 337 sodium 142 potassium 4.2 glucose 89 BUN 10 creatinine 0.67 total bilirubin 0.2 alkaline phosphatase 101 SGOT 13 SGPT 12 total protein 5.9 albumin 3.4 calcium 8.8 Basic Metabolic Panel:  Recent Labs  78/29/56 0520  06/02/14 0441 06/03/14 0414 06/04/14 0540 07/04/14 0703 07/19/14  NA 140  < > 142 139 138 140 143  K 3.9  < > 3.1* 3.7 3.6 3.7 4.3  CL 100*  < > 107 104 102 100*  --   CO2 30  < > 24 27 28 28   --   GLUCOSE 121*  < > 90 96 96 104*  --   BUN 21*   < > 16 13 12 9 11   CREATININE 0.50  < > 0.54 0.60 0.62 0.71 0.7  CALCIUM 9.3  < > 8.2* 9.1 9.2 9.3  --   MG 2.2  --  2.0 2.0  --   --   --   PHOS 4.0  --  3.5 3.9  --   --   --   < > = values in this interval not displayed. Liver Function Tests:  Recent Labs  05/20/14 1343 05/21/14 0400 06/04/14 0540  AST 28 73* 25  ALT 29 34 23  ALKPHOS 125* 108 99  BILITOT 0.6 1.1 0.4  PROT 6.7 6.6 6.7  ALBUMIN 3.9 3.6 3.1*   CBC:  Recent Labs  05/28/14 0425 05/29/14 0510  06/03/14 0414 06/04/14 0540 07/04/14 0703 07/19/14  WBC 12.2* 13.0*  < > 13.7*  13.5* 7.8 7.6  NEUTROABS 8.7* 9.3*  --   --  9.7*  --   --   HGB 10.3* 10.6*  < > 11.2* 11.4* 11.2* 11.0*  HCT 33.8* 34.2*  < > 36.3 36.3 36.0 35*  MCV 87.1 86.6  < > 85.6 85.4 86.1  --   PLT 273 306  < > 334 331 301 217  < > = values in this interval not displayed.  Lipid Panel:  Recent Labs  05/21/14 1048  HDL 61   CBG:  Recent Labs  05/30/14 2342 05/31/14 0412 05/31/14 0751  GLUCAP 90 99 99    Ct Head Wo Contrast  09/08/2014   CLINICAL DATA:  Followup intracranial hemorrhage that was acute in April of this year. Previous craniotomy for evacuation.  EXAM: CT HEAD WITHOUT CONTRAST  TECHNIQUE: Contiguous axial images were obtained from the base of the skull through the vertex without intravenous contrast.  COMPARISON:  Multiple previous studies from 05/20/2014 through 08/04/2014.  FINDINGS: There has been previous left frontoparietal craniotomy at the vertex. Continued evolutionary changes related to a left frontoparietal intraparenchymal hematoma with evacuation. Atrophy and encephalomalacia in that region is present. No evidence of any recurrent hemorrhage. Old infarction in the left occipital lobe and left basal ganglia is unchanged. No new infarction. No mass lesion, hydrocephalus or extra-axial collection. Sinuses are clear. There is atherosclerotic calcification of the major vessels at the base of the brain.  IMPRESSION:  Expected evolutionary changes. Atrophy and encephalomalacia at the left frontoparietal vertex at the site of previous hemorrhage. No additional hemorrhage.  Old strokes in the left occipital lobe and left basal ganglia.   Electronically Signed   By: Paulina Fusi M.D.   On: 09/08/2014 14:29   Ct Angio Neck W/cm &/or Wo/cm  09/08/2014   CLINICAL DATA:  Follow-up left ICA stenosis.  EXAM: CT ANGIOGRAPHY NECK  TECHNIQUE: Multidetector CT imaging of the neck was performed using the standard protocol during bolus administration of intravenous contrast. Multiplanar CT image reconstructions and MIPs were obtained to evaluate the vascular anatomy. Carotid stenosis measurements (when applicable) are obtained utilizing NASCET criteria, using the distal internal carotid diameter as the denominator.  CONTRAST:  100 mL Isovue 370  COMPARISON:  Neck MRA 05/26/2014  FINDINGS: Aortic arch: 3 vessel aortic arch with moderate atherosclerotic plaque. There is 70% stenosis of the proximal right subclavian artery near its origin. Mild plaque involving the brachiocephalic and left subclavian artery origins results in less than 50% narrowing.  Right carotid system: Common carotid and cervical internal carotid arteries are patent with mild plaque at the carotid bifurcation and in the proximal ICA without significant stenosis. There is a retropharyngeal course of the proximal ICA.  Left carotid system: Common carotid artery is patent with less than 50% narrowing of its origin due to the plaque. Moderate, partially ulcerated plaque is again seen involving the carotid bifurcation proximal ICA. There is 55% stenosis of the proximal ICA, unchanged. Partially retropharyngeal course is again seen of the proximal ICA.  Vertebral arteries: Vertebral arteries are patent and codominant. Moderate to severe right vertebral artery origin stenosis does not appear significantly changed. There is also moderate to severe left vertebral artery origin  stenosis, not well demonstrated on the prior study.  Skeleton: Moderate to severe cervical facet arthrosis, greater on the right and with associated grade 1 anterolisthesis of C3 on C4 and C4 on C5. Advanced disc degeneration at C5-6 and C6-7.  Other neck: Moderate-sized right pleural effusion  is partially visualized and is grossly similar to a 08/05/2014 chest CT. Mild asymmetric left palatine tonsillar soft tissue prominence with at most minimally asymmetric enhancement compared to the right but no definite discrete mass.  IMPRESSION: 1. Unchanged, 55% proximal left ICA stenosis. 2. No right-sided cervical carotid artery stenosis. 3. Moderate to severe stenoses of both vertebral artery origins. 4. Mild asymmetry of the left palatine tonsil. Correlate with direct visualization.   Electronically Signed   By: Sebastian Ache   On: 09/08/2014 14:47     ASSESSMENT/PLAN:  Physical deconditioning - for home health PT, OT, ST, nursing and home health aide  Intracerebral hemorrhage with hemorrhagic stroke S/P left frontoparietal craniotomy - continue aspirin 81 mg by mouth daily; follow-up with neurology in 1 month  Pulmonary embolism - continue Coumadin; no SOB  Right leg DVT - continue Coumadin  Depression - mood is stable; continue Celexa 10 mg 1 tab by mouth daily  Hypothyroidism - TSH 2.793; continue Synthroid 75 g 1 tab by mouth daily  GERD - continue Protonix 40 mg 1 tab by mouth daily  Hypertension - recently increase metoprolol to 37.5 mg by mouth twice a day  Constipation - continue MiraLAX 17 g by mouth twice a day and senna S2 tabs by mouth twice a day  Anemia of chronic disease - hemoglobin 10.4; stable  Aphasia - continue with home health ST  Abdominal aortic aneurysm - S/P stent; continue aspirin 81 mg by mouth daily  Right hemiplegia - continue Home health PT, OT, Nursing and Home health aide  Long-term use of anticoagulation - INR 2.7, therapeutic; continue Coumadin 3.5 mg (  2.5 mg  + 1 mg = 3.5 mg) by mouth daily and check INR on 09/28/14     I have filled out patient's discharge paperwork and written prescriptions.  Patient will receive home health PT, OT, ST, Nursing and home health aide.  DME provided:  18" X 18" standard wheelchair with anti-tippers, desk length swing away arm rests, half lap tray (right clear if possible) and right break extender, standard leg rests, wheelchair cushion, 3-in-1 bedside commode.    Total discharge time: Greater than 30 minutes  Discharge time involved coordination of the discharge process with social worker, nursing staff and therapy department. Medical justification for home health services/DME verified.    [facility], NP [employer] [phone]

## 2014-10-05 ENCOUNTER — Telehealth: Payer: Self-pay | Admitting: *Deleted

## 2014-10-05 NOTE — Telephone Encounter (Signed)
Received a [employer] fax result from [facility] #:[phone] regarding patient's INR of 3.1 Not our patient, called and spoke with Cordelia Pen at [facility] and they have corrected this and sent it to correct provider.

## 2014-10-09 ENCOUNTER — Other Ambulatory Visit: Payer: Self-pay | Admitting: Adult Health

## 2014-10-21 ENCOUNTER — Telehealth: Payer: Self-pay | Admitting: *Deleted

## 2014-10-21 NOTE — Telephone Encounter (Signed)
Rn does not have form from [place] from patient as of now. Will wait on fax or mail form.

## 2014-10-21 NOTE — Telephone Encounter (Signed)
Form,[employer] sent to Katrina and Dr Roda Shutters 10/21/14.

## 2014-10-23 ENCOUNTER — Other Ambulatory Visit: Payer: Self-pay | Admitting: Adult Health

## 2014-10-25 NOTE — Telephone Encounter (Signed)
Rn call patients daughter Helmut Muster about her moms disability form. Rn stated to [place] that we dont have a release form or 25.00 payment from patient. Helmut Muster stated that pts disability case manager stated they would be paying the 25.00 for the patient, and they should have a release form on file. Rn stated a call will be made to [facility] concerning payment and medical release form.

## 2014-10-27 NOTE — Telephone Encounter (Signed)
Left voice message Carolyn [name] at [facility] at [phone] about patients disability claim. Rn explain on voice mail that we need a payment of 25.00 for payment for filling the form out, and a medical release form. Rn explain that a payment needs to paid to [facility] and a release needs to be fax.

## 2014-10-28 NOTE — Telephone Encounter (Signed)
I spoke to [place] at [facility].  I told him of our request.  Since our MR fax is out of commission, told to send to [phone].  He is speaking to [place] in billing about payment.

## 2014-11-01 ENCOUNTER — Other Ambulatory Visit: Payer: Self-pay | Admitting: Adult Health

## 2014-11-01 NOTE — Telephone Encounter (Signed)
Rn call patients daughter about her moms disability form from [place]. Rn explain that phone calls have been made to [place] about payment and release form. Rn explain that Carolyn [name] never return phone call or fax any payment or release forms. Also Rn explain that [place] did fax the patients release form, and medical records fax a invoice to [place] for 25..00 payment. Rn explain that a payment has not been sent to [facility] as of today, but will check with billing. Carolyn [name] stated if Carolyn [name] does not pay the 25.00 by Wednesday, she will pay it. Rn stated she will be informed if the payment was sent in or not.

## 2014-11-01 NOTE — Telephone Encounter (Signed)
Daughter Helmut Muster [phone] called to check status of disability form. States disability claim will close on Friday if [employer] doesn't receive it.

## 2014-11-02 DIAGNOSIS — Z0289 Encounter for other administrative examinations: Secondary | ICD-10-CM

## 2014-11-03 ENCOUNTER — Telehealth: Payer: Self-pay

## 2014-11-03 NOTE — Telephone Encounter (Signed)
Disability forms sent to [employer] at [phone] and [phone] the fax numbers they provided. Both faxes were receive and confirm. The 25.00 was paid by [employer] per Lupita Leashonna in medical records.Rn call pts daughter and fax copy to her at [phone]  [phone]. Rn stated the forms have been fax twice and a confirmation was receive.

## 2014-11-03 NOTE — Telephone Encounter (Signed)
Rn call [employer] and they stated they have the disability form and last office viist.

## 2014-11-03 NOTE — Telephone Encounter (Signed)
Rn talk to Carolyn [name] pts daughter she receive the fax at the [phone] for her mom.

## 2014-11-04 ENCOUNTER — Other Ambulatory Visit: Payer: Self-pay | Admitting: Adult Health

## 2014-11-08 ENCOUNTER — Ambulatory Visit: Payer: Medicare Other | Admitting: Neurology

## 2015-07-18 ENCOUNTER — Telehealth: Payer: Self-pay

## 2015-07-18 NOTE — Telephone Encounter (Addendum)
Rn call patients daughter Helmut Musterlicia about her moms long term disability form from [place]. Rn stated her mom was last seen by Dr. Roda ShuttersXu in August 2016.Also the last disability form was filled out in 09/2014. PT stated patient was last seen 08/2014 and will need to schedule another appt. Rn stated pt had appt in October 2016 but it was cancel via automatic phone system. Helmut Musterlicia stated she cancel the appt because her mom was in the hospital and got discharge. Her [place]. Her mom goes to [facility]. Paulino [facility]ilyJ [facility] [place]. Rn stated any disability forms will have to be forward to him. Helmut Musterlicia stated she will call [employer] and have them forward all forms to [facility]. Rn stated the [employer] forms for disability will be put in the sheddar. Pts daughter Helmut Musterlicia verbalized understanding and will forward all forms to her new neurologist.

## 2015-08-17 NOTE — Telephone Encounter (Addendum)
Rn call Dara back at [facility]. Rn stated the pt was last seen 08/2014. Rn stated the form was not filled out and it was put in the sheddar. Rn explain that pt does not have any appts with [facility] and she is going to [facility] in [facility]. Dara stated they sent a invoice of payment for the form. Rn stated daughter was notified to have [facility] to fill out forms and daughter agreed. Rn stated to discuss the payment they made for the form they can ask for accounting.

## 2015-08-17 NOTE — Telephone Encounter (Signed)
Carolyn [name]/[employer]ability [phone] called to check status of disability form that was faxed along with records request on June 16th and June 22nd, (request records from January 2017 to present) to our fax number [phone], disability form fee was paid check # [id number]. Carolyn [name] also requests date of last visit.

## 2016-04-01 IMAGING — CR DG CHEST 1V PORT
1 series · 1 of 1 positions shown · non-contrast
Comparison: 05/28/2014

CLINICAL DATA: Acute respiratory failure

EXAM:
PORTABLE CHEST - 1 VIEW

[AP]
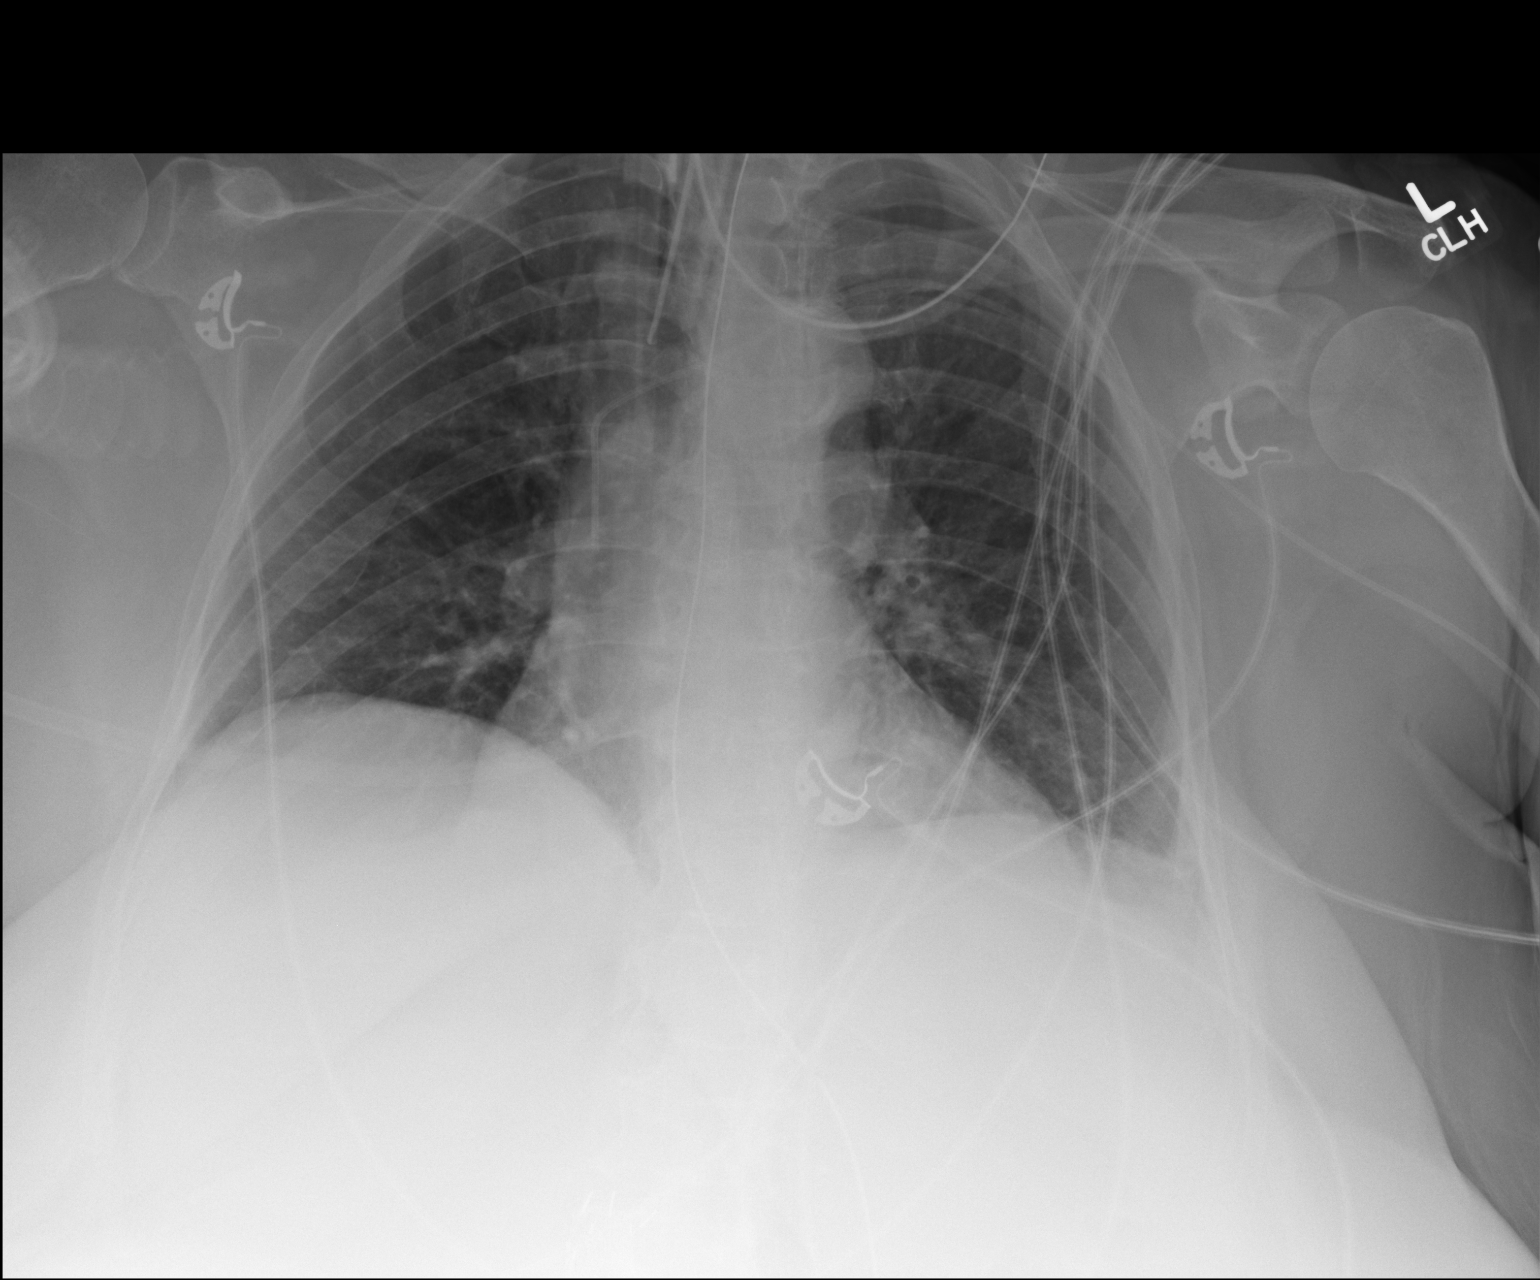

[1 of 1 positions shown; findings below may reference images not displayed]

FINDINGS: The endotracheal tube tip is 4.3 cm above the carina. The
nasogastric tube extends into the stomach. There is a left upper
extremity PICC line with tip in the SVC. The lungs are clear. There
is no pneumothorax. There is no large effusion.
IMPRESSION: Support equipment appears satisfactorily positioned.

The lungs are grossly clear

## 2017-11-10 MED ORDER — CITALOPRAM HYDROBROMIDE 10 MG PO TABS
10.00 | ORAL_TABLET | ORAL | Status: DC
Start: 2017-11-10 — End: 2017-11-10

## 2017-11-10 MED ORDER — LEVETIRACETAM 500 MG PO TABS
1000.00 | ORAL_TABLET | ORAL | Status: DC
Start: 2017-11-10 — End: 2017-11-10

## 2017-11-10 MED ORDER — GLUCOSE 40 % PO GEL
15.00 | ORAL | Status: DC
Start: ? — End: 2017-11-10

## 2017-11-10 MED ORDER — METOPROLOL TARTRATE 50 MG PO TABS
50.00 | ORAL_TABLET | ORAL | Status: DC
Start: 2017-11-10 — End: 2017-11-10

## 2017-11-10 MED ORDER — ASPIRIN EC 81 MG PO TBEC
81.00 | DELAYED_RELEASE_TABLET | ORAL | Status: DC
Start: 2017-11-11 — End: 2017-11-10

## 2017-11-10 MED ORDER — DEXTROSE 10 % IV SOLN
125.00 | INTRAVENOUS | Status: DC
Start: ? — End: 2017-11-10

## 2017-11-10 MED ORDER — LEVOTHYROXINE SODIUM 88 MCG PO TABS
88.00 | ORAL_TABLET | ORAL | Status: DC
Start: 2017-11-11 — End: 2017-11-10

## 2017-11-10 MED ORDER — GENERIC EXTERNAL MEDICATION
50.00 | Status: DC
Start: ? — End: 2017-11-10

## 2017-11-10 MED ORDER — LORAZEPAM 2 MG/ML IJ SOLN
2.00 | INTRAMUSCULAR | Status: DC
Start: ? — End: 2017-11-10

## 2017-11-10 MED ORDER — FENOFIBRATE MICRONIZED 67 MG PO CAPS
134.00 | ORAL_CAPSULE | ORAL | Status: DC
Start: 2017-11-11 — End: 2017-11-10

## 2017-11-10 MED ORDER — GENERIC EXTERNAL MEDICATION
1.00 | Status: DC
Start: ? — End: 2017-11-10

## 2017-11-10 MED ORDER — PANTOPRAZOLE SODIUM 40 MG PO TBEC
40.00 | DELAYED_RELEASE_TABLET | ORAL | Status: DC
Start: 2017-11-11 — End: 2017-11-10

## 2017-11-10 MED ORDER — INSULIN LISPRO 100 UNIT/ML ~~LOC~~ SOLN
0.00 | SUBCUTANEOUS | Status: DC
Start: 2017-11-10 — End: 2017-11-10
